# Patient Record
Sex: Female | Born: 1945
Health system: Southern US, Community
[De-identification: ages and names within clinical notes are randomized; demographics above are authoritative.]

## PROBLEM LIST (undated history)

## (undated) DIAGNOSIS — R0600 Dyspnea, unspecified: Secondary | ICD-10-CM

## (undated) DIAGNOSIS — Z889 Allergy status to unspecified drugs, medicaments and biological substances status: Secondary | ICD-10-CM

## (undated) DIAGNOSIS — F419 Anxiety disorder, unspecified: Secondary | ICD-10-CM

## (undated) DIAGNOSIS — I10 Essential (primary) hypertension: Secondary | ICD-10-CM

## (undated) DIAGNOSIS — R011 Cardiac murmur, unspecified: Secondary | ICD-10-CM

## (undated) DIAGNOSIS — E7800 Pure hypercholesterolemia, unspecified: Secondary | ICD-10-CM

## (undated) DIAGNOSIS — M4800 Spinal stenosis, site unspecified: Secondary | ICD-10-CM

## (undated) DIAGNOSIS — I4891 Unspecified atrial fibrillation: Secondary | ICD-10-CM

## (undated) DIAGNOSIS — C801 Malignant (primary) neoplasm, unspecified: Secondary | ICD-10-CM

## (undated) DIAGNOSIS — J45909 Unspecified asthma, uncomplicated: Secondary | ICD-10-CM

## (undated) DIAGNOSIS — M1990 Unspecified osteoarthritis, unspecified site: Secondary | ICD-10-CM

## (undated) HISTORY — PX: BREAST SURGERY: SHX581

## (undated) HISTORY — PX: CHOLECYSTECTOMY: SHX55

## (undated) HISTORY — PX: CARDIAC CATHETERIZATION: SHX172

## (undated) HISTORY — DX: Spinal stenosis, site unspecified: M48.00

## (undated) HISTORY — PX: ABDOMINAL HYSTERECTOMY: SHX81

---

## 2013-09-02 ENCOUNTER — Encounter (HOSPITAL_BASED_OUTPATIENT_CLINIC_OR_DEPARTMENT_OTHER): Payer: Self-pay | Admitting: Emergency Medicine

## 2013-09-02 ENCOUNTER — Emergency Department (HOSPITAL_BASED_OUTPATIENT_CLINIC_OR_DEPARTMENT_OTHER)
Admission: EM | Admit: 2013-09-02 | Discharge: 2013-09-03 | Disposition: A | Discharge status: Home / Self Care | Payer: No Typology Code available for payment source | Attending: Emergency Medicine | Admitting: Emergency Medicine

## 2013-09-02 DIAGNOSIS — B349 Viral infection, unspecified: Secondary | ICD-10-CM

## 2013-09-02 DIAGNOSIS — I10 Essential (primary) hypertension: Secondary | ICD-10-CM | POA: Diagnosis not present

## 2013-09-02 DIAGNOSIS — B9789 Other viral agents as the cause of diseases classified elsewhere: Secondary | ICD-10-CM | POA: Diagnosis not present

## 2013-09-02 DIAGNOSIS — J45909 Unspecified asthma, uncomplicated: Secondary | ICD-10-CM | POA: Insufficient documentation

## 2013-09-02 DIAGNOSIS — K219 Gastro-esophageal reflux disease without esophagitis: Secondary | ICD-10-CM | POA: Insufficient documentation

## 2013-09-02 DIAGNOSIS — Z79899 Other long term (current) drug therapy: Secondary | ICD-10-CM | POA: Insufficient documentation

## 2013-09-02 HISTORY — DX: Unspecified asthma, uncomplicated: J45.909

## 2013-09-02 HISTORY — DX: Essential (primary) hypertension: I10

## 2013-09-02 NOTE — ED Notes (Signed)
C/o wheezing, cough x 1 week,  Has been see and treated for sore throat and ear infections

## 2013-09-02 NOTE — ED Notes (Addendum)
States she has been seen 3 x's. Once for ear infection and once for bronchitis. She has taken Augmentin, Prednisone, Depo injections, Ceftin.  Started on Symbacort yesterday. Here tonight with continued cough and sob.

## 2013-09-02 NOTE — ED Provider Notes (Signed)
CSN: [id number]     Arrival date & time 09/02/13  2203 History  This chart was scribed for No att. providers found by Anastasia Pall, ED Scribe. This patient was seen in room MH08/MH08 and the patient's care was started at 10:56 PM.   No chief complaint on file.  (Consider location/radiation/quality/duration/timing/severity/associated sxs/prior Treatment) Patient is a 68 y.o. female presenting with pharyngitis and cough. The history is provided by the patient. No language interpreter was used.  Sore Throat This is a chronic problem. The current episode started more than 1 week ago. The problem occurs constantly. The problem has not changed since onset.Pertinent negatives include no chest pain, no abdominal pain, no headaches and no shortness of breath. Nothing aggravates the symptoms. Nothing relieves the symptoms. Treatments tried: inhalers abx steroids. The treatment provided no relief.  Cough Cough characteristics:  Non-productive Severity:  Mild Onset quality:  Gradual Timing:  Intermittent Progression:  Unchanged Smoker: no   Context: upper respiratory infection   Context: not animal exposure   Relieved by:  Nothing Ineffective treatments:  Beta-agonist inhaler, cough suppressants, steroid inhaler and decongestant Associated symptoms: sore throat   Associated symptoms: no chest pain, no headaches, no rash, no shortness of breath and no weight loss   Risk factors: no chemical exposure    HPI Comments: April Guerra is a 68 y.o. female who presents to the Emergency Department having had a negaitve cxr yesterday and 2 rounds of antibiotics and steroids injections steroid tapes and was yesterday started on omeprazole.     PCP - No primary provider on file.  Past Medical History  Diagnosis Date  . Hypertension   . Asthma    Past Surgical History  Procedure Laterality Date  . Cholecystectomy     No family history on file. History  Substance Use Topics  . Smoking status: Never  Smoker   . Smokeless tobacco: Not on file  . Alcohol Use: No   OB History   Grav Para Term Preterm Abortions TAB SAB Ect Mult Living                 Review of Systems  Constitutional: Negative for weight loss.  HENT: Positive for sore throat.   Respiratory: Positive for cough. Negative for shortness of breath.   Cardiovascular: Negative for chest pain.  Gastrointestinal: Negative for abdominal pain.  Skin: Negative for rash.  Neurological: Negative for headaches.  All other systems reviewed and are negative.    Allergies  Review of patient's allergies indicates no known allergies.  Home Medications   Current Outpatient Rx  Name  Route  Sig  Dispense  Refill  . ALBUTEROL IN   Inhalation   Inhale into the lungs.         . Budesonide-Formoterol Fumarate (SYMBICORT IN)   Inhalation   Inhale into the lungs.         Marland Kitchen lisinopril-hydrochlorothiazide (PRINZIDE,ZESTORETIC) 20-25 MG per tablet   Oral   Take 1 tablet by mouth daily.         . metoprolol succinate (TOPROL-XL) 50 MG 24 hr tablet   Oral   Take 50 mg by mouth daily. Take with or immediately following a meal.         . SIMVASTATIN PO   Oral   Take by mouth.          BP 232/102  Pulse 78  Temp(Src) 97.7 F (36.5 C) (Oral)  Resp 22  Ht 4\' 11"  (1.499 m)  Wt  178 lb (80.74 kg)  BMI 35.93 kg/m2  SpO2 100%  Physical Exam  Constitutional: She is oriented to person, place, and time. She appears well-developed and well-nourished. No distress.  HENT:  Head: Normocephalic and atraumatic.  Mouth/Throat: Oropharynx is clear and moist. No oropharyngeal exudate.  Eyes: Conjunctivae and EOM are normal. Pupils are equal, round, and reactive to light.  Neck: Normal range of motion. Neck supple. No tracheal deviation present.  Cardiovascular: Normal rate, regular rhythm and intact distal pulses.   Pulmonary/Chest: Effort normal and breath sounds normal. No stridor. No respiratory distress. She has no wheezes.  She has no rales.  Abdominal: Soft. Bowel sounds are normal. There is no tenderness. There is no rebound and no guarding.  Musculoskeletal: Normal range of motion.  Lymphadenopathy:    She has no cervical adenopathy.  Neurological: She is alert and oriented to person, place, and time.  Skin: Skin is warm and dry.  Psychiatric: She has a normal mood and affect.    ED Course  Procedures (including critical care time)  DIAGNOSTIC STUDIES: Oxygen Saturation is 100% on room air, normal by my interpretation.    COORDINATION OF CARE:  Labs Review Labs Reviewed - No data to display Imaging Review No results found.   EKG Interpretation None     Medications - No data to display  MDM   Final diagnoses:  None    Symptoms sound viral and now patient's BP is up and she is having GERD sx as she has been on so many steroids.  Will start carafate and refer to pulmonology and GI there is no indication for repeat cxr and patient is currently taking antibiotics.  Complete course call doctor about BP.  Follow up with pulmonology and ENT  I personally performed the services described in this documentation, which was scribed in my presence. The recorded information has been reviewed and is accurate.    Carlisle Beers, MD 09/03/13 [phone]

## 2013-09-03 LAB — RAPID STREP SCREEN (MED CTR MEBANE ONLY): Streptococcus, Group A Screen (Direct): NEGATIVE

## 2013-09-03 MED ORDER — AMLODIPINE BESYLATE 5 MG PO TABS
10.0000 mg | ORAL_TABLET | Freq: Once | ORAL | Status: AC
  Administered 2013-09-03: 10 mg via ORAL
  Filled 2013-09-03: qty 2

## 2013-09-03 MED ORDER — GI COCKTAIL ~~LOC~~
30.0000 mL | Freq: Once | ORAL | Status: AC
  Administered 2013-09-03: 30 mL via ORAL
  Filled 2013-09-03: qty 30

## 2013-09-03 MED ORDER — METOPROLOL TARTRATE 12.5 MG HALF TABLET
12.5000 mg | ORAL_TABLET | Freq: Once | ORAL | Status: DC
  Filled 2013-09-03: qty 1

## 2013-09-03 MED ORDER — SUCRALFATE 1 GM/10ML PO SUSP: 1.0000 g | Freq: Three times a day (TID) | ORAL | Status: DC

## 2013-09-03 MED ORDER — HYDROCHLOROTHIAZIDE 25 MG PO TABS
25.0000 mg | ORAL_TABLET | Freq: Once | ORAL | Status: AC
  Administered 2013-09-03: 25 mg via ORAL
  Filled 2013-09-03: qty 1

## 2013-09-05 LAB — CULTURE, GROUP A STREP

## 2013-12-04 DIAGNOSIS — E785 Hyperlipidemia, unspecified: Secondary | ICD-10-CM | POA: Diagnosis present

## 2013-12-04 DIAGNOSIS — E669 Obesity, unspecified: Secondary | ICD-10-CM | POA: Diagnosis present

## 2013-12-04 DIAGNOSIS — R000 Tachycardia, unspecified: Secondary | ICD-10-CM | POA: Diagnosis present

## 2013-12-04 DIAGNOSIS — I10 Essential (primary) hypertension: Secondary | ICD-10-CM | POA: Diagnosis not present

## 2013-12-04 DIAGNOSIS — R002 Palpitations: Secondary | ICD-10-CM | POA: Diagnosis not present

## 2013-12-04 DIAGNOSIS — Z6837 Body mass index (BMI) 37.0-37.9, adult: Secondary | ICD-10-CM | POA: Diagnosis not present

## 2013-12-04 DIAGNOSIS — G8929 Other chronic pain: Secondary | ICD-10-CM | POA: Diagnosis present

## 2013-12-04 DIAGNOSIS — I517 Cardiomegaly: Secondary | ICD-10-CM | POA: Diagnosis present

## 2013-12-04 DIAGNOSIS — E871 Hypo-osmolality and hyponatremia: Secondary | ICD-10-CM | POA: Diagnosis present

## 2013-12-04 DIAGNOSIS — E876 Hypokalemia: Secondary | ICD-10-CM | POA: Diagnosis present

## 2013-12-04 DIAGNOSIS — R079 Chest pain, unspecified: Secondary | ICD-10-CM | POA: Diagnosis not present

## 2013-12-04 DIAGNOSIS — IMO0002 Reserved for concepts with insufficient information to code with codable children: Secondary | ICD-10-CM | POA: Diagnosis present

## 2013-12-04 DIAGNOSIS — I214 Non-ST elevation (NSTEMI) myocardial infarction: Secondary | ICD-10-CM | POA: Diagnosis not present

## 2013-12-16 ENCOUNTER — Emergency Department (HOSPITAL_COMMUNITY): Payer: No Typology Code available for payment source

## 2013-12-16 ENCOUNTER — Inpatient Hospital Stay (HOSPITAL_COMMUNITY)
Admission: EM | Admit: 2013-12-16 | Discharge: 2013-12-19 | DRG: 238 | Disposition: A | Discharge status: Home / Self Care | Payer: No Typology Code available for payment source | Attending: Vascular Surgery | Admitting: Vascular Surgery

## 2013-12-16 ENCOUNTER — Inpatient Hospital Stay (HOSPITAL_COMMUNITY): Payer: No Typology Code available for payment source

## 2013-12-16 ENCOUNTER — Encounter (HOSPITAL_COMMUNITY): Payer: Self-pay | Admitting: Emergency Medicine

## 2013-12-16 DIAGNOSIS — I724 Aneurysm of artery of lower extremity: Secondary | ICD-10-CM | POA: Diagnosis present

## 2013-12-16 DIAGNOSIS — I998 Other disorder of circulatory system: Secondary | ICD-10-CM | POA: Diagnosis present

## 2013-12-16 DIAGNOSIS — K5900 Constipation, unspecified: Secondary | ICD-10-CM | POA: Diagnosis present

## 2013-12-16 DIAGNOSIS — J45909 Unspecified asthma, uncomplicated: Secondary | ICD-10-CM | POA: Diagnosis present

## 2013-12-16 DIAGNOSIS — I729 Aneurysm of unspecified site: Secondary | ICD-10-CM

## 2013-12-16 DIAGNOSIS — Z01818 Encounter for other preprocedural examination: Secondary | ICD-10-CM | POA: Diagnosis not present

## 2013-12-16 DIAGNOSIS — I999 Unspecified disorder of circulatory system: Principal | ICD-10-CM | POA: Diagnosis present

## 2013-12-16 DIAGNOSIS — Y9210 Unspecified residential institution as the place of occurrence of the external cause: Secondary | ICD-10-CM | POA: Diagnosis present

## 2013-12-16 DIAGNOSIS — Z7982 Long term (current) use of aspirin: Secondary | ICD-10-CM

## 2013-12-16 DIAGNOSIS — T81718A Complication of other artery following a procedure, not elsewhere classified, initial encounter: Secondary | ICD-10-CM

## 2013-12-16 DIAGNOSIS — I10 Essential (primary) hypertension: Secondary | ICD-10-CM | POA: Diagnosis present

## 2013-12-16 DIAGNOSIS — R6889 Other general symptoms and signs: Secondary | ICD-10-CM | POA: Diagnosis not present

## 2013-12-16 DIAGNOSIS — M7981 Nontraumatic hematoma of soft tissue: Secondary | ICD-10-CM | POA: Diagnosis not present

## 2013-12-16 DIAGNOSIS — Y849 Medical procedure, unspecified as the cause of abnormal reaction of the patient, or of later complication, without mention of misadventure at the time of the procedure: Secondary | ICD-10-CM | POA: Diagnosis present

## 2013-12-16 LAB — CBC WITH DIFFERENTIAL/PLATELET
BASOS PCT: 0 % (ref 0–1)
Basophils Absolute: 0 10*3/uL (ref 0.0–0.1)
EOS ABS: 0 10*3/uL (ref 0.0–0.7)
EOS PCT: 0 % (ref 0–5)
HCT: 30.3 % — ABNORMAL LOW (ref 36.0–46.0)
Hemoglobin: 10.8 g/dL — ABNORMAL LOW (ref 12.0–15.0)
LYMPHS ABS: 1.4 10*3/uL (ref 0.7–4.0)
Lymphocytes Relative: 15 % (ref 12–46)
MCH: 33.2 pg (ref 26.0–34.0)
MCHC: 35.6 g/dL (ref 30.0–36.0)
MCV: 93.2 fL (ref 78.0–100.0)
MONOS PCT: 9 % (ref 3–12)
Monocytes Absolute: 0.8 10*3/uL (ref 0.1–1.0)
NEUTROS PCT: 76 % (ref 43–77)
Neutro Abs: 7 10*3/uL (ref 1.7–7.7)
Platelets: 275 10*3/uL (ref 150–400)
RBC: 3.25 MIL/uL — AB (ref 3.87–5.11)
RDW: 12.4 % (ref 11.5–15.5)
WBC: 9.3 10*3/uL (ref 4.0–10.5)

## 2013-12-16 MED ORDER — ONDANSETRON HCL 4 MG/2ML IJ SOLN
INTRAMUSCULAR | Status: AC
  Filled 2013-12-16: qty 2

## 2013-12-16 MED ORDER — CENTRUM SILVER ADULT 50+ PO TABS: ORAL_TABLET | Freq: Every morning | ORAL | Status: DC

## 2013-12-16 MED ORDER — FENTANYL CITRATE 0.05 MG/ML IJ SOLN
50.0000 ug | Freq: Once | INTRAMUSCULAR | Status: AC
  Administered 2013-12-16: 50 ug via INTRAVENOUS

## 2013-12-16 MED ORDER — ACETAMINOPHEN 650 MG RE SUPP
325.0000 mg | RECTAL | Status: DC | PRN
Start: 2013-12-16 — End: 2013-12-17

## 2013-12-16 MED ORDER — LISINOPRIL 20 MG PO TABS
20.0000 mg | ORAL_TABLET | Freq: Every day | ORAL | Status: DC
  Administered 2013-12-18 – 2013-12-19 (×2): 20 mg via ORAL
  Filled 2013-12-16 (×3): qty 1

## 2013-12-16 MED ORDER — ONDANSETRON HCL 4 MG/2ML IJ SOLN: 4.0000 mg | Freq: Four times a day (QID) | INTRAMUSCULAR | Status: DC | PRN

## 2013-12-16 MED ORDER — DOCUSATE SODIUM 100 MG PO CAPS
100.0000 mg | ORAL_CAPSULE | Freq: Two times a day (BID) | ORAL | Status: DC
  Administered 2013-12-16: 100 mg via ORAL
  Filled 2013-12-16 (×3): qty 1

## 2013-12-16 MED ORDER — LIDOCAINE HCL (PF) 1 % IJ SOLN
INTRAMUSCULAR | Status: AC
  Filled 2013-12-16: qty 30

## 2013-12-16 MED ORDER — LABETALOL HCL 5 MG/ML IV SOLN: 10.0000 mg | INTRAVENOUS | Status: DC | PRN

## 2013-12-16 MED ORDER — AMLODIPINE BESYLATE 5 MG PO TABS
5.0000 mg | ORAL_TABLET | Freq: Every day | ORAL | Status: DC
  Administered 2013-12-18 – 2013-12-19 (×2): 5 mg via ORAL
  Filled 2013-12-16 (×3): qty 1

## 2013-12-16 MED ORDER — SODIUM CHLORIDE 0.9 % IJ SOLN
3.0000 mL | Freq: Two times a day (BID) | INTRAMUSCULAR | Status: DC
  Administered 2013-12-16: 3 mL via INTRAVENOUS

## 2013-12-16 MED ORDER — ASPIRIN 325 MG PO TABS
325.0000 mg | ORAL_TABLET | ORAL | Status: DC
  Administered 2013-12-19: 325 mg via ORAL
  Filled 2013-12-16 (×2): qty 1

## 2013-12-16 MED ORDER — VITAMIN C 500 MG PO TABS
500.0000 mg | ORAL_TABLET | Freq: Every day | ORAL | Status: DC
  Administered 2013-12-18 – 2013-12-19 (×2): 500 mg via ORAL
  Filled 2013-12-16 (×3): qty 1

## 2013-12-16 MED ORDER — OXYCODONE HCL 5 MG PO TABS
5.0000 mg | ORAL_TABLET | ORAL | Status: DC | PRN
  Administered 2013-12-16 – 2013-12-17 (×4): 5 mg via ORAL
  Administered 2013-12-17: 10 mg via ORAL
  Filled 2013-12-16 (×4): qty 1

## 2013-12-16 MED ORDER — SIMVASTATIN 40 MG PO TABS
40.0000 mg | ORAL_TABLET | Freq: Every day | ORAL | Status: DC
  Administered 2013-12-16: 40 mg via ORAL
  Filled 2013-12-16 (×2): qty 1

## 2013-12-16 MED ORDER — CALCIUM CARBONATE 600 MG PO TABS: 600.0000 mg | ORAL_TABLET | Freq: Two times a day (BID) | ORAL | Status: DC

## 2013-12-16 MED ORDER — CEFUROXIME SODIUM 1.5 G IJ SOLR
1.5000 g | INTRAMUSCULAR | Status: AC
  Administered 2013-12-17: 1.5 g via INTRAVENOUS
  Filled 2013-12-16: qty 1.5

## 2013-12-16 MED ORDER — PHENOL 1.4 % MT LIQD: 1.0000 | OROMUCOSAL | Status: DC | PRN

## 2013-12-16 MED ORDER — METOPROLOL TARTRATE 1 MG/ML IV SOLN: 2.0000 mg | INTRAVENOUS | Status: DC | PRN

## 2013-12-16 MED ORDER — FENTANYL CITRATE 0.05 MG/ML IJ SOLN
INTRAMUSCULAR | Status: AC
  Filled 2013-12-16: qty 2

## 2013-12-16 MED ORDER — LISINOPRIL 20 MG PO TABS: 20.0000 mg | ORAL_TABLET | Freq: Every day | ORAL | Status: DC

## 2013-12-16 MED ORDER — FENTANYL CITRATE 0.05 MG/ML IJ SOLN
50.0000 ug | Freq: Once | INTRAMUSCULAR | Status: AC
  Administered 2013-12-16: 50 ug via INTRAVENOUS
  Filled 2013-12-16: qty 2

## 2013-12-16 MED ORDER — CALCIUM CARBONATE 1250 (500 CA) MG PO TABS
1.0000 | ORAL_TABLET | Freq: Two times a day (BID) | ORAL | Status: DC
  Administered 2013-12-17 – 2013-12-19 (×4): 500 mg via ORAL
  Filled 2013-12-16 (×7): qty 1

## 2013-12-16 MED ORDER — ONDANSETRON HCL 4 MG/2ML IJ SOLN
4.0000 mg | Freq: Once | INTRAMUSCULAR | Status: AC
  Administered 2013-12-16: 4 mg via INTRAVENOUS

## 2013-12-16 MED ORDER — GUAIFENESIN-DM 100-10 MG/5ML PO SYRP
15.0000 mL | ORAL_SOLUTION | ORAL | Status: DC | PRN
Start: 2013-12-16 — End: 2013-12-17
  Filled 2013-12-16: qty 15

## 2013-12-16 MED ORDER — ONDANSETRON HCL 4 MG/2ML IJ SOLN: 4.0000 mg | Freq: Once | INTRAMUSCULAR | Status: DC

## 2013-12-16 MED ORDER — ASPIRIN 325 MG PO TABS: 325.0000 mg | ORAL_TABLET | ORAL | Status: DC

## 2013-12-16 MED ORDER — ADULT MULTIVITAMIN W/MINERALS CH
1.0000 | ORAL_TABLET | Freq: Every day | ORAL | Status: DC
  Administered 2013-12-18 – 2013-12-19 (×2): 1 via ORAL
  Filled 2013-12-16 (×3): qty 1

## 2013-12-16 MED ORDER — SODIUM CHLORIDE 0.9 % IV SOLN
250.0000 mL | INTRAVENOUS | Status: DC | PRN
Start: 2013-12-16 — End: 2013-12-17
  Administered 2013-12-17: 250 mL via INTRAVENOUS

## 2013-12-16 MED ORDER — VITAMIN D3 25 MCG (1000 UNIT) PO TABS
2000.0000 [IU] | ORAL_TABLET | Freq: Every day | ORAL | Status: DC
  Administered 2013-12-18 – 2013-12-19 (×2): 2000 [IU] via ORAL
  Filled 2013-12-16 (×3): qty 2

## 2013-12-16 MED ORDER — ONDANSETRON HCL 4 MG/2ML IJ SOLN
4.0000 mg | Freq: Once | INTRAMUSCULAR | Status: DC
  Filled 2013-12-16: qty 2

## 2013-12-16 MED ORDER — POTASSIUM CHLORIDE CRYS ER 20 MEQ PO TBCR: 20.0000 meq | EXTENDED_RELEASE_TABLET | Freq: Once | ORAL | Status: DC

## 2013-12-16 MED ORDER — ALBUTEROL SULFATE HFA 108 (90 BASE) MCG/ACT IN AERS: 2.0000 | INHALATION_SPRAY | Freq: Four times a day (QID) | RESPIRATORY_TRACT | Status: DC | PRN

## 2013-12-16 MED ORDER — HYDROMORPHONE HCL PF 1 MG/ML IJ SOLN
1.0000 mg | Freq: Once | INTRAMUSCULAR | Status: AC
  Administered 2013-12-16: 1 mg via INTRAVENOUS
  Filled 2013-12-16: qty 1

## 2013-12-16 MED ORDER — PANTOPRAZOLE SODIUM 40 MG PO TBEC: 40.0000 mg | DELAYED_RELEASE_TABLET | Freq: Every day | ORAL | Status: DC

## 2013-12-16 MED ORDER — ALUM & MAG HYDROXIDE-SIMETH 200-200-20 MG/5ML PO SUSP: 15.0000 mL | ORAL | Status: DC | PRN

## 2013-12-16 MED ORDER — HYDRALAZINE HCL 20 MG/ML IJ SOLN: 10.0000 mg | INTRAMUSCULAR | Status: DC | PRN

## 2013-12-16 MED ORDER — METOPROLOL SUCCINATE ER 100 MG PO TB24
100.0000 mg | ORAL_TABLET | Freq: Every day | ORAL | Status: DC
  Administered 2013-12-18 – 2013-12-19 (×2): 100 mg via ORAL
  Filled 2013-12-16 (×3): qty 1

## 2013-12-16 MED ORDER — ACETAMINOPHEN 325 MG PO TABS
325.0000 mg | ORAL_TABLET | ORAL | Status: DC | PRN
  Administered 2013-12-17: 650 mg via ORAL

## 2013-12-16 MED ORDER — ALBUTEROL SULFATE (2.5 MG/3ML) 0.083% IN NEBU: 2.5000 mg | INHALATION_SOLUTION | Freq: Four times a day (QID) | RESPIRATORY_TRACT | Status: DC | PRN

## 2013-12-16 MED ORDER — THROMBIN 20000 UNITS EX SOLR
CUTANEOUS | Status: AC
  Filled 2013-12-16: qty 20000

## 2013-12-16 MED ORDER — CYCLOBENZAPRINE HCL 5 MG PO TABS
5.0000 mg | ORAL_TABLET | Freq: Every day | ORAL | Status: DC | PRN
  Administered 2013-12-17: 5 mg via ORAL
  Filled 2013-12-16: qty 1

## 2013-12-16 MED ORDER — SODIUM CHLORIDE 0.9 % IJ SOLN
3.0000 mL | INTRAMUSCULAR | Status: DC | PRN
Start: 2013-12-16 — End: 2013-12-17
  Administered 2013-12-17: 3 mL via INTRAVENOUS

## 2013-12-16 MED ORDER — MORPHINE SULFATE 2 MG/ML IJ SOLN: 2.0000 mg | INTRAMUSCULAR | Status: DC | PRN

## 2013-12-16 MED ORDER — AMLODIPINE BESYLATE 5 MG PO TABS: 5.0000 mg | ORAL_TABLET | Freq: Every day | ORAL | Status: DC

## 2013-12-16 NOTE — ED Notes (Signed)
Ultrasound at bedside for imaging.

## 2013-12-16 NOTE — ED Notes (Signed)
Report attempted to the floor RN will call back for report shortly.

## 2013-12-16 NOTE — Progress Notes (Signed)
VASCULAR LAB PRELIMINARY  PRELIMINARY  PRELIMINARY  PRELIMINARY  Right lower extremity arterial duplex  completed.    Preliminary report:  Patient transfered  from [facility] following an evaluation for a pseudoaneurysm post cardiac cath. Dr. Oneida Alar was at the bedside in the emergency room for a possible thrombin injection. Pseudoaneurysm was seen measuring 3 cm x 2 cm. The thrombin injection could not be performed due to the inability to clearly image the common femoral artery primarily due to the depth of the vessel.  SLAUGHTER, [place], RVS 12/16/2013, 8:52 PM

## 2013-12-16 NOTE — ED Notes (Signed)
Dr. Raenette Rover at the bedside

## 2013-12-16 NOTE — ED Notes (Signed)
Pt had heart cath yesterday and had clean cath.  They used right groin for access and reported increased pain.  Pt transferred here from [facility] to have the pseudoaneurysm assessed by vascular because it was not fixed by compression.  Distal pulses present and palpable to right lower extremity.  Appears on paperwork our EDP Darl Householder is accepting for Dr. Oneida Alar

## 2013-12-16 NOTE — ED Notes (Signed)
Vascular MD consulted about pt able to eat now, MD refers ok to eat at this time pt to be NPO after MN.

## 2013-12-16 NOTE — ED Notes (Signed)
Pt c/o pain to rt leg after having a cardiac cath yesterday in [place]. Reports pain at cath incision site with bruising. IV placed by EMS

## 2013-12-16 NOTE — ED Notes (Signed)
Ultrasound machine at bedside for imaging.

## 2013-12-16 NOTE — ED Notes (Signed)
Ultrasound brought to bedside for imaging.

## 2013-12-16 NOTE — H&P (Signed)
[facility]  HISTORY AND PHYSICAL   History of Present Illness:  Patient is a 68 y.o. year old female who presents for evaluation of right femoral pseudoaneurysm.  Pt had cardiac cath yesterday in [place] by Dr Sondra Come.  She had some bleeding post procedure but eventually went home.  Began to have more pain and swelling in groin today and went to [place].  She requested transfer here rather than going to see her cardiolgist in [place].  Pt found to have right femoral pseudoaneurysm on [place] at [facility].  She continues to have mild pain in groin currently but no expanding mass.  Per pt no significant coronary disease on cath.  Other medical problems include hypertension and asthma which are stable.  Past Medical History  Diagnosis Date  . Hypertension   . Asthma     Past Surgical History  Procedure Laterality Date  . Cholecystectomy    . Cardiac catheterization    . Breast surgery      Social History History  Substance Use Topics  . Smoking status: Never Smoker   . Smokeless tobacco: Not on file  . Alcohol Use: No    Family History History reviewed. No pertinent family history.  Allergies  No Known Allergies   Current Facility-Administered Medications  Medication Dose Route Frequency Provider Last Rate Last Dose  . albuterol (PROVENTIL HFA;VENTOLIN HFA) 108 (90 BASE) MCG/ACT inhaler 2 puff  2 puff Inhalation Q6H PRN Elam Dutch, MD      . amLODipine (NORVASC) tablet 5 mg  5 mg Oral Daily Elam Dutch, MD      . aspirin tablet 325 mg  325 mg Oral QODAY Elam Dutch, MD      . Derrill Memo ON 12/17/2013] calcium carbonate (OS-CAL) tablet 600 mg  600 mg Oral BID WC Elam Dutch, MD      . Derrill Memo ON 12/17/2013] CENTRUM SILVER ADULT 50+ TABS   Oral q morning - 10a Elam Dutch, MD      . cholecalciferol (VITAMIN D) tablet 2,000 Units  2,000 Units Oral Daily Elam Dutch, MD      . cyclobenzaprine (FLEXERIL) tablet 5 mg  5 mg Oral  Daily PRN Elam Dutch, MD      . lisinopril (PRINIVIL,ZESTRIL) tablet 20 mg  20 mg Oral Daily Elam Dutch, MD      . metoprolol succinate (TOPROL-XL) 24 hr tablet 100 mg  100 mg Oral Daily Elam Dutch, MD      . simvastatin (ZOCOR) tablet 40 mg  40 mg Oral Daily Elam Dutch, MD      . vitamin C (ASCORBIC ACID) tablet 500 mg  500 mg Oral Daily Elam Dutch, MD       Current Outpatient Prescriptions  Medication Sig Dispense Refill  . acetaminophen (TYLENOL) 500 MG tablet Take 500-1,000 mg by mouth 2 (two) times daily. 2 tablets in the morning and 1 tablet in the evening.      Marland Kitchen albuterol (PROVENTIL HFA;VENTOLIN HFA) 108 (90 BASE) MCG/ACT inhaler Inhale 2 puffs into the lungs every 6 (six) hours as needed for wheezing or shortness of breath.      Marland Kitchen amLODipine (NORVASC) 5 MG tablet Take 5 mg by mouth daily.      Marland Kitchen aspirin 325 MG tablet Take 325 mg by mouth every other day.      . calcium carbonate (OS-CAL) 600 MG TABS tablet Take 600 mg  by mouth 2 (two) times daily with a meal.      . cetirizine (ZYRTEC) 10 MG tablet Take 10 mg by mouth daily.      . cholecalciferol (VITAMIN D) 1000 UNITS tablet Take 2,000 Units by mouth daily.      . cyclobenzaprine (FLEXERIL) 5 MG tablet Take 5 mg by mouth daily as needed for muscle spasms.      . diphenhydrAMINE (BENADRYL) 25 mg capsule Take 12.5-25 mg by mouth once.      Marland Kitchen HYDROcodone-acetaminophen (NORCO/VICODIN) 5-325 MG per tablet Take 1 tablet by mouth daily as needed for moderate pain.      Marland Kitchen lisinopril (PRINIVIL,ZESTRIL) 20 MG tablet Take 20 mg by mouth daily.      . Melatonin 5 MG CAPS Take 1 capsule by mouth at bedtime.      . metoprolol succinate (TOPROL-XL) 100 MG 24 hr tablet Take 100 mg by mouth daily. Take with or immediately following a meal.      . Multiple Vitamins-Minerals (CENTRUM SILVER ADULT 50+ PO) Take 1 tablet by mouth daily.      . ranitidine (ZANTAC) 150 MG tablet Take 150 mg by mouth 2 (two) times daily.      .  simvastatin (ZOCOR) 40 MG tablet Take 40 mg by mouth daily.      . vitamin C (ASCORBIC ACID) 500 MG tablet Take 500 mg by mouth daily.        ROS:   General:  No weight loss, Fever, chills  HEENT: No recent headaches, no nasal bleeding, no visual changes, no sore throat  Neurologic: No dizziness, blackouts, seizures. No recent symptoms of stroke or mini- stroke. No recent episodes of slurred speech, or temporary blindness.  Cardiac: No recent episodes of chest pain/pressure, no shortness of breath at rest.  No shortness of breath with exertion.  Denies history of atrial fibrillation or irregular heartbeat  Vascular: No history of rest pain in feet.  No history of claudication.  No history of non-healing ulcer, No history of DVT   Pulmonary: No home oxygen, no productive cough, no hemoptysis,  No asthma or wheezing  Musculoskeletal:  [ ]  Arthritis, [ ]  Low back pain,  [ ]  Joint pain  Hematologic:No history of hypercoagulable state.  No history of easy bleeding.  No history of anemia  Gastrointestinal: No hematochezia or melena,  No gastroesophageal reflux, no trouble swallowing  Urinary: [ ]  chronic Kidney disease, [ ]  on HD - [ ]  MWF or [ ]  TTHS, [ ]  Burning with urination, [ ]  Frequent urination, [ ]  Difficulty urinating;   Skin: No rashes  Psychological: No history of anxiety,  No history of depression   Physical Examination  Filed Vitals:   12/16/13 1915 12/16/13 1930 12/16/13 1945 12/16/13 2000  BP: 166/75 183/68 150/91 157/69  Pulse: 65 60 66 63  Temp:      TempSrc:      Resp: 15 18 15 16   Height:      Weight:      SpO2: 100% 100% 98% 99%    Body mass index is 31.01 kg/(m^2).  General:  Alert and oriented, no acute distress HEENT: Normal Neck: No bruit or JVD Pulmonary: Clear to auscultation bilaterally Cardiac: Regular Rate and Rhythm without murmur Abdomen: Soft, non-tender, non-distended, no mass Skin: No rash, ecchymosis right groin extending to medial  thigh, no discrete mass Extremity Pulses:  2+ radial, brachial, femoral, 2+ right dorsalis pedis,2+ left posterior tibial pulse Musculoskeletal: No  deformity or edema  Neurologic: Upper and lower extremity motor 5/5 and symmetric  DATA:  Duplex repeated here has femoral pseudoaneurysm but no good window to see neck and common femoral artery making it unsafe for thrombin injection   ASSESSMENT:  Right femoral psedoaneurysm no skin compromise hemodynamic instability or active bleeding   PLAN:  Repair right femoral pseudoaneurysm in OR tomorrow.  Npo p midnight.  Consent. AM labs, baseline EKG and chest xray  Ruta Hinds, MD [facility] [place] Office: [phone] Pager: [phone]

## 2013-12-16 NOTE — ED Provider Notes (Signed)
CSN: [id number]     Arrival date & time 12/16/13  1316 History  This chart was scribed for Carmin Muskrat, MD by Roe Coombs, ED Scribe. The patient was seen in room APA08/APA08. Patient's care was started at 1:29 PM.   Chief Complaint  Patient presents with  . Leg Pain    The history is provided by the patient. No language interpreter was used.    HPI Comments: April Guerra is a 68 y.o. female who presents to the Emergency Department complaining of gradually worsening, constant, moderate right leg pain that began yesterday after a cardiac catheterization. She states that she had a cardiac catheterization in [place]. She says that she has some bleeding after the procedure so pressure was applied and she was allowed to go home. Patient states that she developed right upper leg pain immediately after the procedure that was initially just soreness until today when she started to have sharp pains in her leg and back. She says that her upper leg is 2.5 inches larger than her left thigh. She denies numbness or weakness in her extremities. She takes aspirin, but no other anticoagulants.   PCP - Rory Percy, Ledell Noss  Past Medical History  Diagnosis Date  . Hypertension   . Asthma    Past Surgical History  Procedure Laterality Date  . Cholecystectomy    . Cardiac catheterization     History reviewed. No pertinent family history. History  Substance Use Topics  . Smoking status: Never Smoker   . Smokeless tobacco: Not on file  . Alcohol Use: No   OB History   Grav Para Term Preterm Abortions TAB SAB Ect Mult Living                 Review of Systems  Constitutional:       Per HPI, otherwise negative  HENT:       Per HPI, otherwise negative  Respiratory:       Per HPI, otherwise negative  Cardiovascular:       Per HPI, otherwise negative  Gastrointestinal: Negative for vomiting.  Endocrine:       Negative aside from HPI  Genitourinary:       Neg aside from HPI   Musculoskeletal:        Per HPI, otherwise negative  Skin: Negative.   Neurological: Negative for syncope, weakness and numbness.      Allergies  Review of patient's allergies indicates no known allergies.  Home Medications   Prior to Admission medications   Medication Sig Start Date End Date Taking? Authorizing Provider  ALBUTEROL IN Inhale into the lungs.    Historical Provider, MD  Budesonide-Formoterol Fumarate (SYMBICORT IN) Inhale into the lungs.    Historical Provider, MD  lisinopril-hydrochlorothiazide (PRINZIDE,ZESTORETIC) 20-25 MG per tablet Take 1 tablet by mouth daily.    Historical Provider, MD  metoprolol succinate (TOPROL-XL) 50 MG 24 hr tablet Take 50 mg by mouth daily. Take with or immediately following a meal.    Historical Provider, MD  SIMVASTATIN PO Take by mouth.    Historical Provider, MD  sucralfate (CARAFATE) 1 GM/10ML suspension Take 10 mLs (1 g total) by mouth 4 (four) times daily -  with meals and at bedtime. 09/03/13   April K Palumbo-Rasch, MD   Triage Vitals: BP 177/75  Pulse 70  Temp(Src) 98 F (36.7 C) (Oral)  Ht 5\' 3"  (1.6 m)  Wt 175 lb (79.379 kg)  BMI 31.01 kg/m2  SpO2 100% Physical Exam  Nursing  note and vitals reviewed. Constitutional: She is oriented to person, place, and time. She appears well-developed and well-nourished. No distress.  HENT:  Head: Normocephalic and atraumatic.  Eyes: Conjunctivae and EOM are normal.  Cardiovascular: Normal rate, regular rhythm and intact distal pulses.   Pulmonary/Chest: Effort normal and breath sounds normal. No stridor. No respiratory distress.  Abdominal: She exhibits no distension.  Musculoskeletal: She exhibits no edema.  Pain with flexion of the knee. Large hematoma to right medial thigh, covering the proximal anterior and medial aspects w ttp throughout.  Neurological: She is alert and oriented to person, place, and time. No cranial nerve deficit.  Normal sensation in lower extremities.  Skin: Skin is warm and  dry.  Psychiatric: She has a normal mood and affect.    ED Course  Procedures (including critical care time)  COORDINATION OF CARE: 1:37 PM- Patient informed of current plan for treatment and evaluation and agrees with plan at this time.   Imaging Review [place] Lower Ext Art Right Ltd  12/16/2013   CLINICAL DATA:  RIGHT groin pseudoaneurysm  EXAM: UNILATERAL RIGHT LOWER EXTREMITY ARTERIAL DUPLEX SCAN LIMITED  TECHNIQUE: Graded compression of the pseudoaneurysm at the RIGHT groin was performed.  COMPARISON:  Arterial ultrasound 12/16/2013  FINDINGS: With graded compression of the neck of the RIGHT groin pseudoaneurysm, the pseudoaneurysm remained patent.  Despite compression, blood flow remains visible on color Doppler imaging within the neck and body of the pseudoaneurysm.  Unable to elicit thrombosis of the pseudoaneurysm or the neck of the pseudoaneurysm with compression.  Color Doppler imaging and pulsed Doppler imaging performed following conclusion of graded compression demonstrates patency of the underlying RIGHT common femoral artery and vein.  IMPRESSION: Failed graded compression of the pseudoaneurysm at the RIGHT groin.   Electronically Signed   By: Lavonia Dana M.D.   On: 12/16/2013 17:15   [place] Lower Ext Art Right Ltd  12/16/2013   CLINICAL DATA:  Pain and swelling RIGHT inguinal region 1 day post cardiac catheterization  EXAM: UNILATERAL RIGHT LOWER EXTREMITY ARTERIAL DUPLEX SCAN  TECHNIQUE: Gray-scale sonography as well as color Doppler and duplex ultrasound was performed to evaluate the arteries of the lower extremity.  COMPARISON:  None  FINDINGS: At the site of clinical concern in RIGHT inguinal region, and extraluminal hypoechoic collection is identified measuring approximately 6.1 x 2.4 x 2.4 cm.  Collection demonstrates internal blood flow on color Doppler imaging with arterial pulsatility on waveform analysis.  A defined neck up to 7 mm diameter extends from this collection to the  underlying RIGHT common femoral artery.  Finding is compatible with a pseudoaneurysm of the RIGHT common femoral artery.  Surrounding soft tissue infiltration and mild hematoma are present.  RIGHT common femoral vein is displaced medially but patent.  No evidence of AV fistula seen at the RIGHT common femoral vein.  RIGHT superficial femoral and profunda femoral arteries appear patent.  Patient tender at this region with transducer pressure.  IMPRESSION: Extraluminal collection 6.1 x 2.4 x 2.4 cm in size in the RIGHT inguinal region extending via a defined neck up to 7 mm diameter to the underlying RIGHT common femoral artery consistent with a pseudoaneurysm post catheterization.  Surrounding hematoma and soft tissue swelling/infiltration.   Electronically Signed   By: Lavonia Dana M.D.   On: 12/16/2013 16:16    Immediately after the initial evaluation I discussed the patient's case with our ultrasonographer. Performed ultrasound to evaluate for pseudoaneurysm. Subsequently I again discussed findings with ultrasound  team, and upon finding evidence for pseudoaneurysm, we attempted ultrasound closure with pressure.  On exam the pseudoaneurysm was not closed.  After discussing the patient's case with our general surgeon here, I did discuss her case with interventional radiology, and vascular surgery and arrange for transfer to higher level of care for definitive repair of her pseudoaneurysm.   MDM   I personally performed the services described in this documentation, which was scribed in my presence. The recorded information has been reviewed and is accurate.  Patient presents with increasing right proximal thigh pain following procedure performed yesterday.  Although the patient had a procedure done at another facility, in another state, she returns here for evaluation.  On exam patient is awake alert, but has a notable lesion in her proximal thigh.  There is immediate suspicion for  pseudoaneurysm. This suspicion was confirmed with ultrasound. After attempts at bedside closure with ultrasound guided pressure to the neck of the aneurysm were unsuccessful, I discussed the patient's case with multiple specialists, including radiology, general surgery, interventional radiology, and vascular surgery to arrange transfer to a higher level of care for facilitation of definitive closure.      Carmin Muskrat, MD 12/16/13 2119

## 2013-12-16 NOTE — ED Notes (Signed)
[facility] called for Vascular Surgery per MD request.

## 2013-12-16 NOTE — ED Notes (Signed)
Paged Dr. Oneida Alar to notify him of patient's arrival

## 2013-12-17 ENCOUNTER — Inpatient Hospital Stay (HOSPITAL_COMMUNITY): Payer: No Typology Code available for payment source | Admitting: Anesthesiology

## 2013-12-17 ENCOUNTER — Encounter (HOSPITAL_COMMUNITY): Payer: Self-pay | Admitting: Certified Registered Nurse Anesthetist

## 2013-12-17 ENCOUNTER — Encounter (HOSPITAL_COMMUNITY)
Admission: EM | Disposition: A | Discharge status: Home / Self Care | Payer: Self-pay | Source: Home / Self Care | Attending: Vascular Surgery

## 2013-12-17 HISTORY — PX: THROMBECTOMY FEMORAL ARTERY: SHX6406

## 2013-12-17 LAB — COMPREHENSIVE METABOLIC PANEL
ALBUMIN: 3.7 g/dL (ref 3.5–5.2)
ALK PHOS: 50 U/L (ref 39–117)
ALT: 47 U/L — AB (ref 0–35)
AST: 54 U/L — ABNORMAL HIGH (ref 0–37)
BUN: 11 mg/dL (ref 6–23)
CO2: 26 mEq/L (ref 19–32)
Calcium: 9 mg/dL (ref 8.4–10.5)
Chloride: 96 mEq/L (ref 96–112)
Creatinine, Ser: 0.67 mg/dL (ref 0.50–1.10)
GFR calc Af Amer: >90 mL/min (ref >90)
GFR calc non Af Amer: 89 mL/min — ABNORMAL LOW (ref >90)
Glucose, Bld: 96 mg/dL (ref 70–99)
POTASSIUM: 4.4 meq/L (ref 3.7–5.3)
SODIUM: 135 meq/L — AB (ref 137–147)
TOTAL PROTEIN: 6.4 g/dL (ref 6.0–8.3)
Total Bilirubin: 0.5 mg/dL (ref 0.3–1.2)

## 2013-12-17 LAB — URINALYSIS, ROUTINE W REFLEX MICROSCOPIC
BILIRUBIN URINE: NEGATIVE
GLUCOSE, UA: NEGATIVE mg/dL
KETONES UR: NEGATIVE mg/dL
Nitrite: NEGATIVE
PH: 5.5 (ref 5.0–8.0)
PROTEIN: NEGATIVE mg/dL
Specific Gravity, Urine: 1.012 (ref 1.005–1.030)
Urobilinogen, UA: 0.2 mg/dL (ref 0.0–1.0)

## 2013-12-17 LAB — CBC
HEMATOCRIT: 29.3 % — AB (ref 36.0–46.0)
Hemoglobin: 10.1 g/dL — ABNORMAL LOW (ref 12.0–15.0)
MCH: 32.3 pg (ref 26.0–34.0)
MCHC: 34.5 g/dL (ref 30.0–36.0)
MCV: 93.6 fL (ref 78.0–100.0)
Platelets: 268 10*3/uL (ref 150–400)
RBC: 3.13 MIL/uL — ABNORMAL LOW (ref 3.87–5.11)
RDW: 12.6 % (ref 11.5–15.5)
WBC: 9.1 10*3/uL (ref 4.0–10.5)

## 2013-12-17 LAB — URINE MICROSCOPIC-ADD ON

## 2013-12-17 LAB — ABO/RH: ABO/RH(D): O POS

## 2013-12-17 LAB — SURGICAL PCR SCREEN
MRSA, PCR: NEGATIVE
STAPHYLOCOCCUS AUREUS: NEGATIVE

## 2013-12-17 LAB — TYPE AND SCREEN
ABO/RH(D): O POS
Antibody Screen: NEGATIVE

## 2013-12-17 SURGERY — THROMBECTOMY, ARTERY, FEMORAL
Anesthesia: General | Laterality: Right

## 2013-12-17 MED ORDER — HYDRALAZINE HCL 20 MG/ML IJ SOLN: 10.0000 mg | INTRAMUSCULAR | Status: DC | PRN

## 2013-12-17 MED ORDER — ACETAMINOPHEN 325 MG RE SUPP
325.0000 mg | RECTAL | Status: DC | PRN
  Filled 2013-12-17: qty 2

## 2013-12-17 MED ORDER — LIDOCAINE HCL (CARDIAC) 20 MG/ML IV SOLN
INTRAVENOUS | Status: AC
  Filled 2013-12-17: qty 5

## 2013-12-17 MED ORDER — ONDANSETRON HCL 4 MG/2ML IJ SOLN
INTRAMUSCULAR | Status: AC
  Filled 2013-12-17: qty 2

## 2013-12-17 MED ORDER — PROPOFOL 10 MG/ML IV BOLUS
INTRAVENOUS | Status: AC
  Filled 2013-12-17: qty 20

## 2013-12-17 MED ORDER — LIDOCAINE HCL (CARDIAC) 20 MG/ML IV SOLN
INTRAVENOUS | Status: DC | PRN
  Administered 2013-12-17: 40 mg via INTRAVENOUS

## 2013-12-17 MED ORDER — MEPERIDINE HCL 25 MG/ML IJ SOLN: 6.2500 mg | INTRAMUSCULAR | Status: DC | PRN

## 2013-12-17 MED ORDER — PROPOFOL 10 MG/ML IV BOLUS
INTRAVENOUS | Status: DC | PRN
  Administered 2013-12-17: 120 mg via INTRAVENOUS

## 2013-12-17 MED ORDER — PHENOL 1.4 % MT LIQD
1.0000 | OROMUCOSAL | Status: DC | PRN
  Filled 2013-12-17 (×2): qty 177

## 2013-12-17 MED ORDER — DOCUSATE SODIUM 100 MG PO CAPS
100.0000 mg | ORAL_CAPSULE | Freq: Every day | ORAL | Status: DC
  Administered 2013-12-18 – 2013-12-19 (×2): 100 mg via ORAL
  Filled 2013-12-17 (×3): qty 1

## 2013-12-17 MED ORDER — GUAIFENESIN-DM 100-10 MG/5ML PO SYRP
15.0000 mL | ORAL_SOLUTION | ORAL | Status: DC | PRN
  Filled 2013-12-17: qty 15

## 2013-12-17 MED ORDER — ACETAMINOPHEN 325 MG PO TABS
325.0000 mg | ORAL_TABLET | ORAL | Status: DC | PRN
  Administered 2013-12-17 (×2): 650 mg via ORAL
  Administered 2013-12-18: 325 mg via ORAL
  Administered 2013-12-18 – 2013-12-19 (×2): 650 mg via ORAL
  Filled 2013-12-17: qty 1
  Filled 2013-12-17 (×4): qty 2

## 2013-12-17 MED ORDER — ROCURONIUM BROMIDE 100 MG/10ML IV SOLN
INTRAVENOUS | Status: DC | PRN
  Administered 2013-12-17: 40 mg via INTRAVENOUS

## 2013-12-17 MED ORDER — OXYCODONE HCL 5 MG PO TABS
5.0000 mg | ORAL_TABLET | Freq: Four times a day (QID) | ORAL | Status: DC | PRN
  Administered 2013-12-18 (×2): 10 mg via ORAL
  Administered 2013-12-18: 5 mg via ORAL
  Administered 2013-12-19: 10 mg via ORAL
  Filled 2013-12-17 (×4): qty 2
  Filled 2013-12-17: qty 1

## 2013-12-17 MED ORDER — ACETAMINOPHEN 325 MG PO TABS
ORAL_TABLET | ORAL | Status: AC
  Administered 2013-12-17: 650 mg via ORAL
  Filled 2013-12-17: qty 2

## 2013-12-17 MED ORDER — ROCURONIUM BROMIDE 50 MG/5ML IV SOLN
INTRAVENOUS | Status: AC
  Filled 2013-12-17: qty 1

## 2013-12-17 MED ORDER — ALUM & MAG HYDROXIDE-SIMETH 200-200-20 MG/5ML PO SUSP: 15.0000 mL | ORAL | Status: DC | PRN

## 2013-12-17 MED ORDER — HYDROMORPHONE HCL PF 1 MG/ML IJ SOLN
0.2500 mg | INTRAMUSCULAR | Status: DC | PRN
  Administered 2013-12-17 (×2): 0.5 mg via INTRAVENOUS

## 2013-12-17 MED ORDER — DOCUSATE SODIUM 100 MG PO CAPS: 100.0000 mg | ORAL_CAPSULE | Freq: Every day | ORAL | Status: DC

## 2013-12-17 MED ORDER — MIDAZOLAM HCL 5 MG/5ML IJ SOLN
INTRAMUSCULAR | Status: DC | PRN
  Administered 2013-12-17: 2 mg via INTRAVENOUS

## 2013-12-17 MED ORDER — ONDANSETRON HCL 4 MG/2ML IJ SOLN
INTRAMUSCULAR | Status: DC | PRN
  Administered 2013-12-17: 4 mg via INTRAVENOUS

## 2013-12-17 MED ORDER — HYDROMORPHONE HCL PF 1 MG/ML IJ SOLN
INTRAMUSCULAR | Status: AC
  Administered 2013-12-17: 0.5 mg via INTRAVENOUS
  Filled 2013-12-17: qty 1

## 2013-12-17 MED ORDER — THROMBIN 20000 UNITS EX SOLR
CUTANEOUS | Status: DC | PRN
  Administered 2013-12-17: 12:00:00 via TOPICAL

## 2013-12-17 MED ORDER — FENTANYL CITRATE 0.05 MG/ML IJ SOLN
INTRAMUSCULAR | Status: DC | PRN
  Administered 2013-12-17 (×2): 50 ug via INTRAVENOUS

## 2013-12-17 MED ORDER — PROTAMINE SULFATE 10 MG/ML IV SOLN
INTRAVENOUS | Status: DC | PRN
  Administered 2013-12-17: 50 mg via INTRAVENOUS
  Administered 2013-12-17: 15 mg via INTRAVENOUS
  Administered 2013-12-17: 5 mg via INTRAVENOUS
  Administered 2013-12-17: 10 mg via INTRAVENOUS

## 2013-12-17 MED ORDER — DEXTROSE-NACL 5-0.45 % IV SOLN: INTRAVENOUS | Status: DC

## 2013-12-17 MED ORDER — PHENYLEPHRINE HCL 10 MG/ML IJ SOLN
INTRAMUSCULAR | Status: DC | PRN
  Administered 2013-12-17 (×3): 40 ug via INTRAVENOUS
  Administered 2013-12-17 (×2): 80 ug via INTRAVENOUS

## 2013-12-17 MED ORDER — NEOSTIGMINE METHYLSULFATE 10 MG/10ML IV SOLN
INTRAVENOUS | Status: DC | PRN
  Administered 2013-12-17: 3 mg via INTRAVENOUS

## 2013-12-17 MED ORDER — THROMBIN 20000 UNITS EX SOLR
CUTANEOUS | Status: AC
  Filled 2013-12-17: qty 20000

## 2013-12-17 MED ORDER — METOPROLOL TARTRATE 1 MG/ML IV SOLN: 2.0000 mg | INTRAVENOUS | Status: DC | PRN

## 2013-12-17 MED ORDER — DEXTROSE 5 % IV SOLN
INTRAVENOUS | Status: AC
  Filled 2013-12-17: qty 1.5

## 2013-12-17 MED ORDER — DEXTROSE 5 % IV SOLN
1.5000 g | INTRAVENOUS | Status: DC | PRN
  Administered 2013-12-17: 1.5 g via INTRAVENOUS

## 2013-12-17 MED ORDER — MAGNESIUM SULFATE 40 MG/ML IJ SOLN
2.0000 g | Freq: Every day | INTRAMUSCULAR | Status: DC | PRN
  Filled 2013-12-17: qty 50

## 2013-12-17 MED ORDER — MIDAZOLAM HCL 2 MG/2ML IJ SOLN
INTRAMUSCULAR | Status: AC
  Filled 2013-12-17: qty 2

## 2013-12-17 MED ORDER — HEPARIN SODIUM (PORCINE) 1000 UNIT/ML IJ SOLN
INTRAMUSCULAR | Status: DC | PRN
  Administered 2013-12-17: 8000 [IU] via INTRAVENOUS

## 2013-12-17 MED ORDER — HEPARIN SODIUM (PORCINE) 1000 UNIT/ML IJ SOLN
INTRAMUSCULAR | Status: AC
  Filled 2013-12-17: qty 1

## 2013-12-17 MED ORDER — OXYCODONE HCL 5 MG PO TABS: 5.0000 mg | ORAL_TABLET | Freq: Once | ORAL | Status: DC | PRN

## 2013-12-17 MED ORDER — OXYCODONE HCL 5 MG PO TABS
ORAL_TABLET | ORAL | Status: AC
  Administered 2013-12-17: 10 mg via ORAL
  Filled 2013-12-17: qty 2

## 2013-12-17 MED ORDER — FENTANYL CITRATE 0.05 MG/ML IJ SOLN
INTRAMUSCULAR | Status: AC
  Filled 2013-12-17: qty 5

## 2013-12-17 MED ORDER — POTASSIUM CHLORIDE CRYS ER 20 MEQ PO TBCR: 20.0000 meq | EXTENDED_RELEASE_TABLET | Freq: Every day | ORAL | Status: DC | PRN

## 2013-12-17 MED ORDER — MORPHINE SULFATE 2 MG/ML IJ SOLN: 2.0000 mg | INTRAMUSCULAR | Status: DC | PRN

## 2013-12-17 MED ORDER — ONDANSETRON HCL 4 MG/2ML IJ SOLN: 4.0000 mg | Freq: Four times a day (QID) | INTRAMUSCULAR | Status: DC | PRN

## 2013-12-17 MED ORDER — ATORVASTATIN CALCIUM 20 MG PO TABS
20.0000 mg | ORAL_TABLET | Freq: Every day | ORAL | Status: DC
  Administered 2013-12-17 – 2013-12-18 (×2): 20 mg via ORAL
  Filled 2013-12-17 (×4): qty 1

## 2013-12-17 MED ORDER — DEXTROSE 5 % IV SOLN
1.5000 g | Freq: Two times a day (BID) | INTRAVENOUS | Status: AC
  Administered 2013-12-17 – 2013-12-18 (×2): 1.5 g via INTRAVENOUS
  Filled 2013-12-17 (×2): qty 1.5

## 2013-12-17 MED ORDER — PANTOPRAZOLE SODIUM 40 MG PO TBEC
40.0000 mg | DELAYED_RELEASE_TABLET | Freq: Every day | ORAL | Status: DC
  Administered 2013-12-17 – 2013-12-18 (×2): 40 mg via ORAL
  Filled 2013-12-17 (×2): qty 1

## 2013-12-17 MED ORDER — LABETALOL HCL 5 MG/ML IV SOLN: 10.0000 mg | INTRAVENOUS | Status: DC | PRN

## 2013-12-17 MED ORDER — GLYCOPYRROLATE 0.2 MG/ML IJ SOLN
INTRAMUSCULAR | Status: DC | PRN
  Administered 2013-12-17: 0.4 mg via INTRAVENOUS

## 2013-12-17 MED ORDER — SODIUM CHLORIDE 0.9 % IR SOLN
Status: DC | PRN
  Administered 2013-12-17: 11:00:00

## 2013-12-17 MED ORDER — 0.9 % SODIUM CHLORIDE (POUR BTL) OPTIME
TOPICAL | Status: DC | PRN
Start: 2013-12-17 — End: 2013-12-17
  Administered 2013-12-17: 2000 mL

## 2013-12-17 MED ORDER — LACTATED RINGERS IV SOLN
INTRAVENOUS | Status: DC | PRN
  Administered 2013-12-17 (×2): via INTRAVENOUS

## 2013-12-17 MED ORDER — PROMETHAZINE HCL 25 MG/ML IJ SOLN: 6.2500 mg | INTRAMUSCULAR | Status: DC | PRN

## 2013-12-17 MED ORDER — PHENYLEPHRINE 40 MCG/ML (10ML) SYRINGE FOR IV PUSH (FOR BLOOD PRESSURE SUPPORT)
PREFILLED_SYRINGE | INTRAVENOUS | Status: AC
  Filled 2013-12-17: qty 10

## 2013-12-17 MED ORDER — OXYCODONE HCL 5 MG/5ML PO SOLN: 5.0000 mg | Freq: Once | ORAL | Status: DC | PRN

## 2013-12-17 SURGICAL SUPPLY — 45 items
BAG ISOLATION DRAPE 18X18 (DRAPES) ×1 IMPLANT
BLADE 10 SAFETY STRL DISP (BLADE) ×2 IMPLANT
CANISTER SUCTION 2500CC (MISCELLANEOUS) ×2 IMPLANT
CLIP TI MEDIUM 24 (CLIP) ×2 IMPLANT
CLIP TI WIDE RED SMALL 24 (CLIP) ×2 IMPLANT
COVER SURGICAL LIGHT HANDLE (MISCELLANEOUS) ×2 IMPLANT
DERMABOND ADVANCED (GAUZE/BANDAGES/DRESSINGS) ×1
DERMABOND ADVANCED .7 DNX12 (GAUZE/BANDAGES/DRESSINGS) ×1 IMPLANT
DRAIN SNY WOU (WOUND CARE) IMPLANT
DRAPE ISOLATION BAG 18X18 (DRAPES) ×1
DRAPE WARM FLUID 44X44 (DRAPE) ×2 IMPLANT
ELECT REM PT RETURN 9FT ADLT (ELECTROSURGICAL) ×2
ELECTRODE REM PT RTRN 9FT ADLT (ELECTROSURGICAL) ×1 IMPLANT
EVACUATOR SILICONE 100CC (DRAIN) IMPLANT
GLOVE BIO SURGEON STRL SZ 6.5 (GLOVE) ×2 IMPLANT
GLOVE BIO SURGEON STRL SZ7.5 (GLOVE) ×2 IMPLANT
GLOVE BIOGEL PI IND STRL 6.5 (GLOVE) ×3 IMPLANT
GLOVE BIOGEL PI IND STRL 7.5 (GLOVE) ×1 IMPLANT
GLOVE BIOGEL PI INDICATOR 6.5 (GLOVE) ×3
GLOVE BIOGEL PI INDICATOR 7.5 (GLOVE) ×1
GLOVE SURG SS PI 7.5 STRL IVOR (GLOVE) ×2 IMPLANT
GOWN STRL REUS W/ TWL LRG LVL3 (GOWN DISPOSABLE) ×3 IMPLANT
GOWN STRL REUS W/TWL LRG LVL3 (GOWN DISPOSABLE) ×3
KIT BASIN OR (CUSTOM PROCEDURE TRAY) ×2 IMPLANT
KIT ROOM TURNOVER OR (KITS) ×2 IMPLANT
NS IRRIG 1000ML POUR BTL (IV SOLUTION) ×4 IMPLANT
PACK PERIPHERAL VASCULAR (CUSTOM PROCEDURE TRAY) ×2 IMPLANT
PAD ARMBOARD 7.5X6 YLW CONV (MISCELLANEOUS) ×4 IMPLANT
SPONGE SURGIFOAM ABS GEL 100 (HEMOSTASIS) IMPLANT
STAPLER VISISTAT 35W (STAPLE) IMPLANT
SUT PROLENE 5 0 C 1 24 (SUTURE) ×2 IMPLANT
SUT PROLENE 6 0 CC (SUTURE) ×2 IMPLANT
SUT SILK 3 0 (SUTURE)
SUT SILK 3-0 18XBRD TIE 12 (SUTURE) IMPLANT
SUT VIC AB 2-0 CT1 27 (SUTURE) ×1
SUT VIC AB 2-0 CT1 TAPERPNT 27 (SUTURE) ×1 IMPLANT
SUT VIC AB 3-0 SH 27 (SUTURE) ×1
SUT VIC AB 3-0 SH 27X BRD (SUTURE) ×1 IMPLANT
SUT VICRYL 4-0 PS2 18IN ABS (SUTURE) ×2 IMPLANT
TAPE UMBILICAL COTTON 1/8X30 (MISCELLANEOUS) IMPLANT
TOWEL OR 17X24 6PK STRL BLUE (TOWEL DISPOSABLE) ×4 IMPLANT
TOWEL OR 17X26 10 PK STRL BLUE (TOWEL DISPOSABLE) ×2 IMPLANT
TRAY FOLEY CATH 16FRSI W/METER (SET/KITS/TRAYS/PACK) ×2 IMPLANT
UNDERPAD 30X30 INCONTINENT (UNDERPADS AND DIAPERS) ×2 IMPLANT
WATER STERILE IRR 1000ML POUR (IV SOLUTION) ×2 IMPLANT

## 2013-12-17 NOTE — Progress Notes (Signed)
Wedding band removed and given to husband at bedside

## 2013-12-17 NOTE — Interval H&P Note (Signed)
History and Physical Interval Note:  12/17/2013 9:54 AM  April Guerra  has presented today for surgery, with the diagnosis of RIGHT FEMORAL ARTERY PSEUDOANEURYSM  The various methods of treatment have been discussed with the patient and family. After consideration of risks, benefits and other options for treatment, the patient has consented to  Procedure(s): REPAIR OF RIGHT FEMORAL ARTERY PSEUDOANEURYSM (Right) as a surgical intervention .  The patient's history has been reviewed, patient examined, no change in status, stable for surgery.  I have reviewed the patient's chart and labs.  Questions were answered to the patient's satisfaction.     FIELDS,CHARLES E

## 2013-12-17 NOTE — Anesthesia Preprocedure Evaluation (Addendum)
Anesthesia Evaluation  Patient identified by MRN, date of birth, ID band Patient awake    Reviewed: Allergy & Precautions, H&P , NPO status , Patient's Chart, lab work & pertinent test results, reviewed documented beta blocker date and time   History of Anesthesia Complications Negative for: history of anesthetic complications  Airway Mallampati: II TM Distance: >3 FB Neck ROM: Full    Dental  (+) Teeth Intact, Dental Advisory Given   Pulmonary asthma (last inhaler use 3-4 months ago) ,  breath sounds clear to auscultation  Pulmonary exam normal       Cardiovascular hypertension, Pt. on medications and Pt. on home beta blockers - angina+ Peripheral Vascular Disease (pseudoaneurysm at cath site) Rhythm:Regular Rate:Normal  12/16/13 cath: no sig disease, as per patient Pt reports normal stress test last week   Neuro/Psych negative neurological ROS     GI/Hepatic GERD-  Medicated and Controlled,Elevated LFTs   Endo/Other  Morbid obesity  Renal/GU negative Renal ROS     Musculoskeletal   Abdominal (+) + obese,   Peds  Hematology  (+) Blood dyscrasia (Hb 10.1), anemia ,   Anesthesia Other Findings   Reproductive/Obstetrics                        Anesthesia Physical Anesthesia Plan  ASA: III  Anesthesia Plan: General   Post-op Pain Management:    Induction: Intravenous  Airway Management Planned: Oral ETT  Additional Equipment:   Intra-op Plan:   Post-operative Plan: Extubation in OR  Informed Consent: I have reviewed the patients History and Physical, chart, labs and discussed the procedure including the risks, benefits and alternatives for the proposed anesthesia with the patient or authorized representative who has indicated his/her understanding and acceptance.   Dental advisory given  Plan Discussed with: CRNA and Surgeon  Anesthesia Plan Comments: (Plan routine monitors, GETA)         Anesthesia Quick Evaluation

## 2013-12-17 NOTE — Op Note (Signed)
Procedure: Repair of right femoral pseudoaneurysm  Preoperative diagnosis: Right femoral pseudoaneurysm  Post Operative diagnosis: Same  Anesthesia: Gen.  Assistant: Upton Cellar RN FA  Indications: Patient is a 68 year old female who underwent cardiac catheterization earlier this week. She presented to the emergency room last evening with hematoma and pain in the right groin. She had a duplex ultrasound which showed femoral pseudoaneurysm. She did not have an appropriate window to do injection and had failed compression management of her pseudoaneurysm.  Operative findings: Needle hole lateral aspect of the distal common femoral artery right at the takeoff of the SFA  Operative details: After obtaining informed consent, the patient was taken to the operating room. The patient was placed in supine position on the operating room table. After induction of general anesthesia and endotracheal intubation, a Foley catheter was placed. Next the patient's entire right lower extremity was prepped and draped in usual sterile fashion. An oblique incision was made in the right groin carried down through the subcutaneous tissues down to level of the right common femoral artery. The common femoral artery was dissected free circumferentially right at the level of the inguinal ligament. Dissection then proceeded down the anterior surface of the common femoral artery. The pseudoaneurysm cavity was encountered. There was pulsatile bleeding from this. Using digital control over the hole in the artery I was able to dissect free the superficial femoral and profunda femoris arteries below the level of the opening in the artery. Vessel loops were placed around this. The patient was given 8000 units of intravenous heparin. Vessel loops were used to control the artery proximally and distally. A 6-0 Prolene suture was used to repair a single hole in the artery. At completion the vessel loops were removed and there was good  pulsatile flow and Doppler flow in the profunda femoris and superficial femoral arteries as well as the common femoral artery. The wound was thoroughly irrigated with normal saline solution. Thrombin and Gelfoam were applied. The patient was given 100 mg of protamine for heparin reversal. Hemostasis was obtained. The thrombin and Gelfoam was removed. The deep layers the groin were closed in multiple layers of running 2-0 Vicryl suture. The superficial layer closed with running 3-0 Vicryl suture. Skin was closed with a 4 0 Vicryl subcuticular stitch. Dermabond was applied to the skin. The patient tolerated the procedure well and there were no complications. Instrument sponge and needle count was correct at the end of the case. Patient was taken to recovery room in stable condition.  Ruta Hinds, MD [facility] [place] Office: [phone] Pager: [phone]

## 2013-12-17 NOTE — Anesthesia Postprocedure Evaluation (Signed)
  Anesthesia Post-op Note  Patient: April Guerra  Procedure(s) Performed: Procedure(s): REPAIR OF RIGHT FEMORAL ARTERY PSEUDOANEURYSM (Right)  Patient Location: PACU  Anesthesia Type:General  Level of Consciousness: awake, alert , oriented and patient cooperative  Airway and Oxygen Therapy: Patient Spontanous Breathing and Patient connected to nasal cannula oxygen  Post-op Pain: none  Post-op Assessment: Post-op Vital signs reviewed, Patient's Cardiovascular Status Stable, Respiratory Function Stable, Patent Airway, No signs of Nausea or vomiting and Pain level controlled  Post-op Vital Signs: Reviewed and stable  Last Vitals:  Filed Vitals:   12/17/13 1345  BP:   Pulse: 71  Temp:   Resp: 16    Complications: No apparent anesthesia complications

## 2013-12-17 NOTE — Transfer of Care (Signed)
Immediate Anesthesia Transfer of Care Note  Patient: April Guerra  Procedure(s) Performed: Procedure(s): REPAIR OF RIGHT FEMORAL ARTERY PSEUDOANEURYSM (Right)  Patient Location: PACU  Anesthesia Type:General  Level of Consciousness: awake, alert  and oriented  Airway & Oxygen Therapy: Patient Spontanous Breathing and Patient connected to nasal cannula oxygen  Post-op Assessment: Report given to PACU RN, Post -op Vital signs reviewed and stable and Patient moving all extremities X 4  Post vital signs: Reviewed and stable  Complications: No apparent anesthesia complications

## 2013-12-17 NOTE — Anesthesia Procedure Notes (Signed)
Procedure Name: Intubation Date/Time: 12/17/2013 10:45 AM Performed by: Trixie Deis A Pre-anesthesia Checklist: Patient identified, Timeout performed, Emergency Drugs available, Suction available and Patient being monitored Patient Re-evaluated:Patient Re-evaluated prior to inductionOxygen Delivery Method: Circle system utilized Preoxygenation: Pre-oxygenation with 100% oxygen Intubation Type: IV induction Ventilation: Mask ventilation without difficulty Laryngoscope Size: Mac and 3 Grade View: Grade II Tube type: Oral Tube size: 7.0 mm Number of attempts: 2 Airway Equipment and Method: Bougie stylet Placement Confirmation: ETT inserted through vocal cords under direct vision,  breath sounds checked- equal and bilateral and positive ETCO2 Secured at: 21 cm Tube secured with: Tape Dental Injury: Teeth and Oropharynx as per pre-operative assessment

## 2013-12-18 LAB — BASIC METABOLIC PANEL
BUN: 6 mg/dL (ref 6–23)
CO2: 26 mEq/L (ref 19–32)
CREATININE: 0.59 mg/dL (ref 0.50–1.10)
Calcium: 8.9 mg/dL (ref 8.4–10.5)
Chloride: 95 mEq/L — ABNORMAL LOW (ref 96–112)
GFR calc non Af Amer: >90 mL/min (ref >90)
Glucose, Bld: 141 mg/dL — ABNORMAL HIGH (ref 70–99)
Potassium: 3.7 mEq/L (ref 3.7–5.3)
SODIUM: 135 meq/L — AB (ref 137–147)

## 2013-12-18 LAB — CBC
HCT: 29 % — ABNORMAL LOW (ref 36.0–46.0)
Hemoglobin: 10.1 g/dL — ABNORMAL LOW (ref 12.0–15.0)
MCH: 33.1 pg (ref 26.0–34.0)
MCHC: 34.8 g/dL (ref 30.0–36.0)
MCV: 95.1 fL (ref 78.0–100.0)
Platelets: 246 10*3/uL (ref 150–400)
RBC: 3.05 MIL/uL — ABNORMAL LOW (ref 3.87–5.11)
RDW: 12.6 % (ref 11.5–15.5)
WBC: 11 10*3/uL — AB (ref 4.0–10.5)

## 2013-12-18 MED ORDER — BISACODYL 10 MG RE SUPP
10.0000 mg | Freq: Every day | RECTAL | Status: DC | PRN
  Administered 2013-12-18: 10 mg via RECTAL
  Filled 2013-12-18: qty 1

## 2013-12-18 NOTE — Progress Notes (Signed)
[facility] [place]  Subjective  - Some soreness right groin and numbness inner thigh, constipated no BM for 5 days   Objective 131/49 79 97.8 F (36.6 C) (Oral) 16 97%  Intake/Output Summary (Last 24 hours) at 12/18/13 0958 Last data filed at 12/18/13 0933  Gross per 24 hour  Intake   1440 ml  Output   2110 ml  Net   -670 ml   Right groin incision clean no hematoma 2+ DP pulse right foot  Assessment/Planning: 1.  Pseudoaneurysm repair doing well, ambulate today d/c home tomorrow follow up with me in 2-3 weeks 2.  Constipation dulcolax today  FIELDS,CHARLES E 12/18/2013 9:58 AM --  Laboratory Lab Results:  Recent Labs  12/17/13 0415 12/18/13 0140  WBC 9.1 11.0*  HGB 10.1* 10.1*  HCT 29.3* 29.0*  PLT 268 246   BMET  Recent Labs  12/17/13 0415 12/18/13 0140  NA 135* 135*  K 4.4 3.7  CL 96 95*  CO2 26 26  GLUCOSE 96 141*  BUN 11 6  CREATININE 0.67 0.59  CALCIUM 9.0 8.9    COAG No results found for this basename: INR, PROTIME   No results found for this basename: PTT

## 2013-12-18 NOTE — Evaluation (Signed)
Physical Therapy Evaluation Patient Details Name: April Guerra MRN: [id number] DOB: 01/28/1946 Today's Date: 12/18/2013   History of Present Illness  Pt with right femoral pseudoanerysm s/p repair after heart catherization  Clinical Impression  Pt with chronic back pain from injury at work with pt educated for back precautions with transfers, function and gait. Pt currently with gait and transfer deficits related to back and acute right groin pain with pt educated for compensatory techniques and RW use. Pt will benefit from acute therapy to address above and below deficits to return pt to Independent level.    Follow Up Recommendations No PT follow up    Equipment Recommendations  Rolling walker with 5" wheels (youth RW)    Recommendations for Other Services       Precautions / Restrictions Precautions Precautions: Back Precaution Booklet Issued: Yes (comment) Precaution Comments: pt with chronic back pain, receiving therapy with sx planned      Mobility  Bed Mobility Overal bed mobility: Needs Assistance Bed Mobility: Rolling;Sidelying to Sit Rolling: Supervision Sidelying to sit: Supervision       General bed mobility comments: cues for sequence to decrease back and groin pain  Transfers Overall transfer level: Modified independent   Transfers: Sit to/from Stand              Ambulation/Gait Ambulation/Gait assistance: Supervision Ambulation Distance (Feet): 300 Feet Assistive device: Rolling walker (2 wheeled) Gait Pattern/deviations: Step-through pattern;Decreased stride length;Decreased stance time - right   Gait velocity interpretation: Below normal speed for age/gender    Stairs Stairs: Yes   Stair Management: One rail Right;One rail Left;Step to pattern;Forwards;Sideways Number of Stairs: 8 General stair comments: Pt ascended 4 steps sideways with right rail then 4 steps forward with left rail and HHA. descending forward with rail and  HHA  Wheelchair Mobility    Modified Rankin (Stroke Patients Only)       Balance Overall balance assessment: No apparent balance deficits (not formally assessed)                                           Pertinent Vitals/Pain 3/10 right groin pain at rest, 6/10 with ambulation, RN aware and repositioned    Home Living Family/patient expects to be discharged to:: Private residence Living Arrangements: Spouse/significant other Available Help at Discharge: Family;Available 24 hours/day Type of Home: House Home Access: Stairs to enter Entrance Stairs-Rails: Right Entrance Stairs-Number of Steps: 4 Home Layout: Two level Home Equipment: Cane - single point      Prior Function Level of Independence: Independent               Hand Dominance        Extremity/Trunk Assessment   Upper Extremity Assessment: Overall WFL for tasks assessed           Lower Extremity Assessment: RLE deficits/detail      Cervical / Trunk Assessment: Normal  Communication   Communication: No difficulties  Cognition Arousal/Alertness: Awake/alert Behavior During Therapy: WFL for tasks assessed/performed Overall Cognitive Status: Within Functional Limits for tasks assessed                      General Comments      Exercises        Assessment/Plan    PT Assessment Patient needs continued PT services  PT Diagnosis Difficulty walking;Acute pain   PT  Problem List Decreased activity tolerance;Pain;Decreased knowledge of use of DME  PT Treatment Interventions Gait training;Stair training;Functional mobility training;Therapeutic activities;Patient/family education;DME instruction   PT Goals (Current goals can be found in the Care Plan section) Acute Rehab PT Goals Patient Stated Goal: 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, 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 Goal Formulation: With patient/family Time For Goal Achievement: 12/25/13 Potential to Achieve Goals: Good    Frequency  Min 3X/week   Barriers to discharge        Co-evaluation               End of Session   Activity Tolerance: Patient tolerated treatment well Patient left: in chair;with call bell/phone within reach;with family/visitor present;with nursing/sitter in room Nurse Communication: Mobility status;Precautions         Time: 3567-0141 PT Time Calculation (min): 24 min   Charges:   PT Evaluation $Initial PT Evaluation Tier I: 1 Procedure PT Treatments $Therapeutic Activity: 8-22 mins   PT G Codes:          Melford Aase 12/18/2013, 11:51 AM Elwyn Reach, Jud

## 2013-12-18 NOTE — Progress Notes (Signed)
Occupational Therapy Evaluation Patient Details Name: Tatijana Bierly MRN: [id number] DOB: 1946-02-12 Today's Date: 12/18/2013    History of Present Illness Pt with right femoral pseudoanerysm s/p repair after heart catherization   Clinical Impression   PTA pt lived at home and was independent with ADLs and works as an Therapist, sports in [place].  Pt is ambulating at Mod Independent level and will have her husband to assist with LB ADLs until her surgical site heals. Pt has no ADL concerns, acute OT to sign off.     Follow Up Recommendations  No OT follow up;Supervision/Assistance - 24 hour    Equipment Recommendations  None recommended by OT       Precautions / Restrictions Precautions Precautions: Back Restrictions Weight Bearing Restrictions: No      Mobility Bed Mobility               General bed mobility comments: Pt sitting in recliner before/after OT session.  Transfers Overall transfer level: Modified independent Equipment used: Rolling walker (2 wheeled) Transfers: Sit to/from Stand                Balance Overall balance assessment: No apparent balance deficits (not formally assessed)                                          ADL Overall ADL's : Needs assistance/impaired Eating/Feeding: Independent;Sitting   Grooming: Supervision/safety;Standing   Upper Body Bathing: Set up;Sitting   Lower Body Bathing: Minimal assistance;Sit to/from stand   Upper Body Dressing : Set up;Sitting   Lower Body Dressing: Moderate assistance;Sit to/from stand   Toilet Transfer: Supervision/safety;Ambulation;RW   Toileting- Clothing Manipulation and Hygiene: Supervision/safety;Sit to/from stand   Tub/ Shower Transfer: Tub transfer;Min guard   Functional mobility during ADLs: Supervision/safety;Rolling walker General ADL Comments: Pt overall moving well at Supervision level with RW. Pt reports that she previously crossed her legs in figure four pattern for LB  ADLs, however groin pain may limit ability to do so. Pt reports that husband will assist with LB ADLs and she is not concerned. Educated pt and husband on fall prevention and energy conservation techniques.      Vision   Pt reports no change from baseline.    No apparent visual deficits.                  Perception Perception Perception Tested?: No   Praxis Praxis Praxis tested?: Within functional limits    Pertinent Vitals/Pain NAD        Extremity/Trunk Assessment Upper Extremity Assessment Upper Extremity Assessment: Overall WFL for tasks assessed   Lower Extremity Assessment Lower Extremity Assessment: Defer to PT evaluation   Cervical / Trunk Assessment Cervical / Trunk Assessment: Normal   Communication Communication Communication: No difficulties   Cognition Arousal/Alertness: Awake/alert Behavior During Therapy: WFL for tasks assessed/performed Overall Cognitive Status: Within Functional Limits for tasks assessed                                Home Living Family/patient expects to be discharged to:: Private residence Living Arrangements: Spouse/significant other Available Help at Discharge: Family;Available 24 hours/day Type of Home: House Home Access: Stairs to enter [employer] of Steps: 4 Entrance Stairs-Rails: Right Home Layout: Two level Alternate Level Stairs-Number of Steps: 4 Alternate Level Stairs-Rails: Left Bathroom Shower/Tub: Tub/shower  unit Shower/tub characteristics: Curtain Biochemist, clinical: Standard     Home Equipment: Cane - single point          Prior Functioning/Environment Level of Independence: Independent        Comments: Pt works as an Therapist, 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

## 2013-12-19 ENCOUNTER — Telehealth: Payer: Self-pay | Admitting: Vascular Surgery

## 2013-12-19 MED ORDER — SIMVASTATIN 40 MG PO TABS: 20.0000 mg | ORAL_TABLET | Freq: Every day | ORAL | Status: DC

## 2013-12-19 MED ORDER — HYDROCODONE-ACETAMINOPHEN 5-325 MG PO TABS: 1.0000 | ORAL_TABLET | Freq: Every day | ORAL | Status: DC | PRN

## 2013-12-19 MED ORDER — ATORVASTATIN CALCIUM 20 MG PO TABS: 20.0000 mg | ORAL_TABLET | Freq: Every day | ORAL | Status: DC

## 2013-12-19 MED ORDER — OXYCODONE HCL 5 MG PO TABS: 5.0000 mg | ORAL_TABLET | Freq: Four times a day (QID) | ORAL | Status: DC | PRN

## 2013-12-19 NOTE — Discharge Summary (Signed)
[facility] Discharge Summary  April Guerra 1946-01-17 67 y.o. female  [id number]  Admission Date: 12/16/2013  Discharge Date: 12/19/2013  Physician: Elam Dutch, MD  Admission Diagnosis: Pseudoaneurysm following procedure [997.79, 442.9]   HPI:   This is a 68 y.o. female who presented for evaluation of right femoral pseudoaneurysm. Pt had cardiac cath in [facility] by Dr Sondra Come. She had some bleeding post procedure but eventually went home. Began to have more pain and swelling in groin  and went to [place]. She requested transfer to [facility] rather than going to see her cardiolgist in [place]. Pt found to have right femoral pseudoaneurysm on [place] at [facility]. She continued to have mild pain in groin currently but no expanding mass. Per pt no significant coronary disease on cath. Other medical problems include hypertension and asthma which are stable.  Hospital Course:  The patient was admitted to the hospital and taken to the operating room on 12/17/2013 and underwent: repair of right femoral pseudoaneurysm.   The pt tolerated the procedure well and was transported to the PACU in good condition.   On POD 1, she was having some soreness in right groin and numbness of her inner thigh. She was able to ambulate well with a rolling walker.   On POD 2, her groin soreness was unchanged. The groin incision was healing well and she had a palpable 2+ DP pulse.  The remainder of the hospital course consisted of increasing mobilization and increasing intake of solids without difficulty. She was discharged home on POD 2 in good condition.   CBC    Component Value Date/Time   WBC 11.0* 12/18/2013 0140   RBC 3.05* 12/18/2013 0140   HGB 10.1* 12/18/2013 0140   HCT 29.0* 12/18/2013 0140   PLT 246 12/18/2013 0140   MCV 95.1 12/18/2013 0140   MCH 33.1 12/18/2013 0140   MCHC 34.8 12/18/2013 0140   RDW 12.6 12/18/2013 0140   LYMPHSABS 1.4 12/16/2013 1740   MONOABS 0.8  12/16/2013 1740   EOSABS 0.0 12/16/2013 1740   BASOSABS 0.0 12/16/2013 1740    BMET    Component Value Date/Time   NA 135* 12/18/2013 0140   K 3.7 12/18/2013 0140   CL 95* 12/18/2013 0140   CO2 26 12/18/2013 0140   GLUCOSE 141* 12/18/2013 0140   BUN 6 12/18/2013 0140   CREATININE 0.59 12/18/2013 0140   CALCIUM 8.9 12/18/2013 0140   GFRNONAA >90 12/18/2013 0140   GFRAA >90 12/18/2013 0140     Discharge Instructions:   The patient is discharged with extensive instructions on wound care and progressive ambulation.  They are instructed not to drive or perform any heavy lifting until returning to see the physician in his office.  Discharge Instructions   Call MD for:  redness, tenderness, or signs of infection (pain, swelling, bleeding, redness, odor or green/yellow discharge around incision site)    Complete by:  As directed      Call MD for:  severe or increased pain, loss or decreased feeling  in affected limb(s)    Complete by:  As directed      Call MD for:  temperature >100.5    Complete by:  As directed      Driving Restrictions    Complete by:  As directed   No driving for 2 weeks     Lifting restrictions    Complete by:  As directed   No lifting for 4 weeks  Resume previous diet    Complete by:  As directed      may wash over wound with mild soap and water    Complete by:  As directed   Wash the groin wound with soap and water daily and pat dry. (No tub bath-only shower)  Then put a dry gauze or washcloth there to keep this area dry daily and as needed.  Do not use Vaseline or neosporin on your incisions.  Only use soap and water on your incisions and then protect and keep dry.           Discharge Diagnosis:  Pseudoaneurysm following procedure [997.79, 442.9]  Secondary Diagnosis: Patient Active Problem List   Diagnosis Date Noted  . Pseudoaneurysm of femoral artery 12/16/2013   Past Medical History  Diagnosis Date  . Hypertension   . Asthma        Medication  List         acetaminophen 500 MG tablet  Commonly known as:  TYLENOL  Take 500-1,000 mg by mouth 2 (two) times daily. 2 tablets in the morning and 1 tablet in the evening.     albuterol 108 (90 BASE) MCG/ACT inhaler  Commonly known as:  PROVENTIL HFA;VENTOLIN HFA  Inhale 2 puffs into the lungs every 6 (six) hours as needed for wheezing or shortness of breath.     amLODipine 5 MG tablet  Commonly known as:  NORVASC  Take 5 mg by mouth daily.     aspirin 325 MG tablet  Take 325 mg by mouth every other day.     calcium carbonate 600 MG Tabs tablet  Commonly known as:  OS-CAL  Take 600 mg by mouth 2 (two) times daily with a meal.     CENTRUM SILVER ADULT 50+ PO  Take 1 tablet by mouth daily.     cetirizine 10 MG tablet  Commonly known as:  ZYRTEC  Take 10 mg by mouth daily.     cholecalciferol 1000 UNITS tablet  Commonly known as:  VITAMIN D  Take 2,000 Units by mouth daily.     cyclobenzaprine 5 MG tablet  Commonly known as:  FLEXERIL  Take 5 mg by mouth daily as needed for muscle spasms.     diphenhydrAMINE 25 mg capsule  Commonly known as:  BENADRYL  Take 12.5-25 mg by mouth once.     HYDROcodone-acetaminophen 5-325 MG per tablet  Commonly known as:  NORCO/VICODIN  Take 1 tablet by mouth daily as needed for moderate pain.     lisinopril 20 MG tablet  Commonly known as:  PRINIVIL,ZESTRIL  Take 20 mg by mouth daily.     Melatonin 5 MG Caps  Take 1 capsule by mouth at bedtime.     metoprolol succinate 100 MG 24 hr tablet  Commonly known as:  TOPROL-XL  Take 100 mg by mouth daily. Take with or immediately following a meal.     oxyCODONE 5 MG immediate release tablet  Commonly known as:  Oxy IR/ROXICODONE  Take 1 tablet (5 mg total) by mouth every 6 (six) hours as needed for severe pain.     ranitidine 150 MG tablet  Commonly known as:  ZANTAC  Take 150 mg by mouth 2 (two) times daily.     simvastatin 40 MG tablet  Commonly known as:  ZOCOR  Take 0.5  tablets (20 mg total) by mouth daily.     vitamin C 500 MG tablet  Commonly known as:  ASCORBIC ACID  Take 500 mg by mouth daily.        Roxicodone #30 No Refill  Disposition: Home  Patient's condition: is Good  Follow up: 1. Dr. Oneida Alar in 2 weeks   April Jock, PA-C [facility] [phone] 12/19/2013  8:34 AM

## 2013-12-19 NOTE — Progress Notes (Signed)
[facility] [place]  Subjective  - Some groin soreness inner thigh numbness similar to yesterday   Objective 124/50 78 98.7 F (37.1 C) (Oral) 16 96%  Intake/Output Summary (Last 24 hours) at 12/19/13 0713 Last data filed at 12/18/13 0933  Gross per 24 hour  Intake    240 ml  Output    250 ml  Net    -10 ml   Ecchymosis groin unchanged No hematoma Incision healing 2+ DP pulse   Assessment/Planning: D/c home today Follow up 2 weeks  April Guerra E 12/19/2013 7:13 AM --  Laboratory Lab Results:  Recent Labs  12/17/13 0415 12/18/13 0140  WBC 9.1 11.0*  HGB 10.1* 10.1*  HCT 29.3* 29.0*  PLT 268 246   BMET  Recent Labs  12/17/13 0415 12/18/13 0140  NA 135* 135*  K 4.4 3.7  CL 96 95*  CO2 26 26  GLUCOSE 96 141*  BUN 11 6  CREATININE 0.67 0.59  CALCIUM 9.0 8.9    COAG No results found for this basename: INR, PROTIME   No results found for this basename: PTT

## 2013-12-19 NOTE — Progress Notes (Signed)
UR Completed Brenda Graves-Bigelow, RN,BSN [phone]  

## 2013-12-19 NOTE — Care Management Note (Signed)
    Page 1 of 1   12/19/2013     10:00:23 AM CARE MANAGEMENT NOTE 12/19/2013  Patient:  April Guerra, April Guerra   Account Number:  [id number]  Date Initiated:  12/19/2013  Documentation initiated by:  GRAVES-BIGELOW,BRENDA  Subjective/Objective Assessment:   Pt admitted for REPAIR OF RIGHT FEMORAL ARTERY PSEUDOANEURYSM.     Action/Plan:   CM will continue to monitor for disposition needs.   Anticipated DC Date:  12/19/2013   Anticipated DC Plan:  HOME/SELF CARE      DC Planning Services  CM consult      PAC Choice  DURABLE MEDICAL EQUIPMENT   Choice offered to / List presented to:  C-1 Patient   DME arranged  Vassie Moselle      DME agency  [place].        Status of service:  Completed, signed off Medicare Important Message given?  YES (If response is "NO", the following Medicare IM given date fields will be blank) Date Medicare IM given:  12/19/2013 Date Additional Medicare IM given:    Discharge Disposition:    Per UR Regulation:  Reviewed for med. necessity/level of care/duration of stay  If discussed at [place] of Stay Meetings, dates discussed:    Comments:  12-19-13 0959 Jacqlyn Krauss, RN,BSN [place] walker to be delivered to room before d/c via [facility]. No further needs from CM at this time.

## 2013-12-19 NOTE — Discharge Instructions (Signed)
° ° ° ° ° ° ° ° ° ° ° ° ° ° ° ° ° ° ° ° ° °  Pt is not to return to work for at least 2 weeks when she see's Dr. Oneida Alar back in his office.  Further recommendations will be made at that time depending on her progress.  Leontine Locket, PA-C 12/19/2013 9:41 AM

## 2013-12-19 NOTE — Telephone Encounter (Addendum)
Message copied by Gena Fray on Mon Dec 19, 2013  3:58 PM ------      Message from: Peter Minium K      Created: Mon Dec 19, 2013  9:38 AM      Regarding: Schedule                   ----- Message -----         From: Gabriel Earing, PA-C         Sent: 12/19/2013   8:32 AM           To: [facility] Charge Pool            S/p  Repair of right femoral pseudoaneurysm. F/u with Dr. Oneida Alar in 2 weeks.            Thanks      Samantha ------  12/19/13: spoke with pt- she knows to call if she has problems arise. Zigmund Daniel ok'd 4 weeks, as CEF is out of office 2 weeks)

## 2013-12-20 ENCOUNTER — Encounter (HOSPITAL_COMMUNITY): Payer: Self-pay | Admitting: Vascular Surgery

## 2013-12-20 ENCOUNTER — Telehealth: Payer: Self-pay

## 2013-12-20 ENCOUNTER — Emergency Department (HOSPITAL_COMMUNITY): Payer: No Typology Code available for payment source

## 2013-12-20 ENCOUNTER — Emergency Department (HOSPITAL_COMMUNITY)
Admission: EM | Admit: 2013-12-20 | Discharge: 2013-12-20 | Disposition: A | Discharge status: Home / Self Care | Payer: No Typology Code available for payment source | Attending: Emergency Medicine | Admitting: Emergency Medicine

## 2013-12-20 DIAGNOSIS — Z9889 Other specified postprocedural states: Secondary | ICD-10-CM | POA: Insufficient documentation

## 2013-12-20 DIAGNOSIS — K5900 Constipation, unspecified: Secondary | ICD-10-CM | POA: Diagnosis not present

## 2013-12-20 DIAGNOSIS — Z9089 Acquired absence of other organs: Secondary | ICD-10-CM | POA: Insufficient documentation

## 2013-12-20 DIAGNOSIS — Z79899 Other long term (current) drug therapy: Secondary | ICD-10-CM | POA: Insufficient documentation

## 2013-12-20 DIAGNOSIS — I10 Essential (primary) hypertension: Secondary | ICD-10-CM | POA: Insufficient documentation

## 2013-12-20 DIAGNOSIS — J45909 Unspecified asthma, uncomplicated: Secondary | ICD-10-CM | POA: Insufficient documentation

## 2013-12-20 DIAGNOSIS — Z7982 Long term (current) use of aspirin: Secondary | ICD-10-CM | POA: Insufficient documentation

## 2013-12-20 MED ORDER — ONDANSETRON 8 MG PO TBDP: 8.0000 mg | ORAL_TABLET | Freq: Once | ORAL | Status: DC

## 2013-12-20 MED ORDER — FLEET ENEMA 7-19 GM/118ML RE ENEM
1.0000 | ENEMA | Freq: Once | RECTAL | Status: AC
  Administered 2013-12-20: 1 via RECTAL

## 2013-12-20 NOTE — ED Notes (Signed)
Pt co constipation, last BM Wednesday.

## 2013-12-20 NOTE — Telephone Encounter (Signed)
Phone call from pt. With c/o constipation.  Reports she hasn't had BM since Wednesday, 6/24.  States she rec'd stool softeners and a suppositories while in hospital with no results.  Reports she taken Dulcolax tabs x 2 and Glycerine supp. x2 since she has been home, but no results.  Denies abdominal bloating or nausea/vomiting.  States is passing gas.  Reports she feels a hard ball of stool in the rectum, but unable to pass.  Recently gave herself a Fleets Enema; able to retain approx. 2/3 of the fluid, but not able to pass hard ball of stool.  Advised to try a Fleets Oil Retention Enema to assist evacuation of the hard stool.  Discussed with Dr. Kellie Simmering.  Agreed with trying oil retention enema, and if no success, should go to her medical doctor, or the ER for disimpaction.  Phone call to pt. To give Dr. Evelena Leyden recommendations.  Spoke w/ husband.  Verb. Understanding.  States he will get an oil retention enema for the pt.

## 2013-12-20 NOTE — ED Notes (Signed)
Pt. On toilet for approx. 25 min, no much more relief, small amount of stool per pt. She reports nausea is a little better now and wants to wait on taking the zofran.

## 2013-12-20 NOTE — Discharge Instructions (Signed)
°Emergency Department Resource Guide °1) Find a Doctor and Pay Out of Pocket °Although you won't have to find out who is covered by your insurance plan, it is a good idea to ask around and get recommendations. You will then need to call the office and see if the doctor you have chosen will accept you as a new patient and what types of options they offer for patients who are self-pay. Some doctors offer discounts or will set up payment plans for their patients who do not have insurance, but you will need to ask so you aren't surprised when you get to your appointment. ° °2) Contact Your Local Health Department °Not all health departments have doctors that can see patients for sick visits, but many do, so it is worth a call to see if yours does. If you don't know where your local health department is, you can check in your phone book. The CDC also has a tool to help you locate your state's health department, and many state websites also have listings of all of their local health departments. ° °3) Find a Walk-in Clinic °If your illness is not likely to be very severe or complicated, you may want to try a walk in clinic. These are popping up all over the country in pharmacies, drugstores, and shopping centers. They're usually staffed by nurse practitioners or physician assistants that have been trained to treat common illnesses and complaints. They're usually fairly quick and inexpensive. However, if you have serious medical issues or chronic medical problems, these are probably not your best option. ° °No Primary Care Doctor: °- Call Health Connect at  832-8000 - they can help you locate a primary care doctor that  accepts your insurance, provides certain services, etc. °- Physician Referral Service- 1-800-533-3463 ° °Chronic Pain Problems: °Organization         Address  Phone   Notes  °[facility]  (336) 297-2271 Patients need to be referred by their primary care doctor.  ° °Medication  Assistance: °Organization         Address  Phone   Notes  °[place] [facility] [address] °[place], [place] [address] (336) 641-8030 --Must be a resident of [place] °-- Must have NO insurance coverage whatsoever (no Medicaid/ Medicare, etc.) °-- The pt. MUST have a primary care doctor that directs their care regularly and follows them in the community °  °[facility]  (866) 331-1348   °[employer]  (888) 892-1162   ° °Agencies that provide inexpensive medical care: °Organization         Address  Phone   Notes  °[facility]  (336) 832-8035   °[facility]    (336) 832-7272   °[facility] [address] °[place], [place] [address] (336) 832-4777   °[facility] [address], °[place] (336) 271-4999   °[facility]    (336) 373-0678   °[facility]    (336) 272-1050   °[facility] ° [address], [place] Phone:  (336) 832-4444, Fax:  (336) 832-4440 Hours of Operation:  9 am - 6 pm, M-F.  Also accepts Medicaid/Medicare and self-pay.  °[facility] ° [address], [place] Phone: (336) 832-3150, Fax: (336) 832-3151. Hours of Operation:  8:30 am - 5:30 pm, M-F.  Also accepts Medicaid and self-pay.  °[facility] [place] [address], [place] Phone: (336) 878-6027   °[facility] [address], [place], [place] (336)723-1848, Ext. 123 Mondays & Thursdays: 7-9 AM.  First 15 patients are seen on a first come, first serve basis. °  ° °Medicaid-accepting [place] Providers: ° °Organization         Address  Phone   Notes  °[facility] [address], [place] (336) 641-2100 Also accepts self-pay patients.  °[facility] [address], [place] ° (336) 856-9996   °[facility] [address], [place]  (336) 288-8857   °[facility] [address] [place] [address], [place] (336) 299-7000   °Veita Bland [address], [place]  ° (336) 373-1557 Only accepts [place] Access Medicaid patients after they have their name applied to their card.  ° °Self-Pay (no insurance) in [place]: ° °Organization         Address  Phone   Notes  °Sickle Cell Patients, [facility] [address], [place] (336) 832-1970   °[facility] [address], [place] (336) 832-4400   °[facility] ° [address], [place] (336) 992-4800   °[facility]/Dr. Osei-Bonsu ° [address] [place] [address], [place] or [address], [place] (336) [phone] Phone number for both [place] and [place] locations is the same.  °[facility] [address], [place] (336) 299-0000   °[facility] [address] [place] [address], [place] or [address] (336) 852-7530 °(336) 878-2260   °[facility] [address], [place] (336) 350-1642, phone; (336) 294-5005, fax Sees patients 1st and 3rd Saturday of every month.  Must not qualify for public or private insurance (i.e. Medicaid, Medicare, [place] Health Choice, Veterans' Benefits) • Household income should be no more than 200% of the poverty level •The clinic cannot treat you if you are pregnant or think you are pregnant • Sexually transmitted diseases are not treated at the clinic.  ° ° °Dental Care: °Organization         Address  Phone  Notes  °[place] [facility] [address], [place] (336) 641-6152 Accepts children up to age 21 who are enrolled in Medicaid or [place] Health Choice; pregnant women with a Medicaid card; and children who have applied for Medicaid or [place] Health Choice, but were declined, whose parents can pay a reduced fee at time of service.  °[place]  [facility] [place]  [address], [place] (336) 641-7733 Accepts children up to age 21 who are enrolled in Medicaid or [place] Health Choice; pregnant women with a Medicaid card; and children who have applied for Medicaid or [place] Health Choice, but were declined, whose parents can pay a reduced fee at time of service.  °[facility] ° [address], [place] (336) 641-4533 Patients are seen by appointment only. Walk-ins are not accepted. [facility] will see patients 18 years of age and older. °Monday - Tuesday (8am-5pm) °Most Wednesdays (8:30-5pm) °$30 per visit, cash only  °[facility] ° [address], [place] (336) 641-4533 Patients are seen by appointment only. Walk-ins are not accepted. [facility] will see patients 18 years of age and older. °One   Wednesday Evening (Monthly: Volunteer Based).  $30 per visit, cash only  °[facility]  (919) 537-3737 for adults; Children under age 4, call Graduate Pediatric Dentistry at (919) [phone]. Children aged 4-14, please call (919) [phone] to request a pediatric application. ° Dental services are provided in all areas of dental care including fillings, crowns and bridges, complete and partial dentures, implants, gum treatment, root canals, and extractions. Preventive care is also provided. Treatment is provided to both adults and children. °Patients are selected via a lottery and there is often a waiting list. °  °[facility] [address], °[place] ° (336) 763-8833 www.drcivils.com °  °[facility] [address], [place], [place] (336)723-1848, Ext. 123 Second and Fourth Thursday of each month, opens at 6:30 AM; Clinic ends at 9 AM.  Patients are seen on a first-come first-served basis, and a limited number are seen during each clinic.  ° °[facility] ° [address], [place], [place] (336) 723-7904    Eligibility Requirements °You must have lived in [place], or [place] counties for at least the last three months. °  You cannot be eligible for state or federal sponsored healthcare insurance, including Veterans Administration, Medicaid, or Medicare. °  You generally cannot be eligible for healthcare insurance through your employer.  °  How to apply: °Eligibility screenings are held every Tuesday and Wednesday afternoon from 1:00 pm until 4:00 pm. You do not need an appointment for the interview!  °[facility] [address], [place], [place] 336-631-2330   °[facility]  336-342-8273   °[facility]  336-703-3100   °[facility]  336-570-6415   ° °Behavioral Health Resources in the Community: °Intensive Outpatient Programs °Organization         Address  Phone  Notes  °[facility] [address], [place], [place] 336-878-6098   °[facility] [address], [place], [place] 336-832-9800   °[facility] [address], [place], [place] ° 336-882-2125   °[facility] [address],  °[place], [place] 1-800-853-5163 or 336-641-4981   °Substance Abuse Resources °Organization         Address  Phone  Notes  °[facility]  336-882-2125   °[facility]  336-784-9470   °[facility]  336-285-9073   °[facility]  336-845-3988   °Residential & Outpatient Substance Abuse Program  1-800-659-3381   °Psychological Services °Organization         Address  Phone  Notes  °[facility]  336- 832-9600   °[facility]  336- 378-7881   °[facility] [address], [place] 1-800-853-5163 or 336-641-4981   ° °Mobile Crisis Teams °Organization         Address  Phone  Notes  °[facility], Mobile Crisis Care Unit  1-877-626-1772   °Assertive °[facility] ° [address]  [place], [place] 336-834-9664   °Sharon DeEsch [address] °[place] 336-554-5454   ° °Self-Help/Support Groups °Organization         Address  Phone             Notes  °[facility] [place] - variety of support groups  336- 373-1402 Call for more information  °Narcotics Anonymous (NA), [facility] [address], °[place]  2 meetings at this location  ° °  Residential Treatment Programs Organization         Address  Phone  Notes  [facility] [address],    [place]  1-346 875 3941   [facility]  [address], [place] [address], [place], [place]   [place], [place] 330-046-5972 Admissions: 8am-3pm M-F  [facility] [address]    [place], [place] 282-081-3887   [facility] [address] [place], [place], [place]   [facility] [address],  [place], [place]   [facility] Intensive Outpatient [address], [place], [place]   [facility] ([place]  [place], [place] 1-314-513-7467 or 9861175720   [facility] [address], [place], [place] Accepts Medicaid  [facility] [address],  [place] 1-973-356-3378 Substance Abuse/Addiction Treatment   [facility] Organization         Address  Phone  Notes  [facility]  [phone]   Domenic Schwab, PhD [address] [place], [place]   302-393-0788 or (607) 753-3982   [facility] [place], [place] 612-344-8164   [facility] [address], [place], [place] [phone] Insurance/Medicaid/sponsorship through [facility] [address] [place]                                    [place], [place] [phone] [place] [address], [place] [phone]    Dr. Adele Schilder  [phone]   [facility] Dept. 1) [address] [place] 2) [place] 3)  [address], [place] [phone]  [phone]   [place] [phone] or [phone] (After Hours)       Take over the counter laxative (such as miralax, milk of magnesia, senokot) AND a dulcolax suppository (or enema) tomorrow and repeat both the next day.  Begin to take over the counter stool softener (colace), as directed on packaging, for the next month.  Continue to take your usual prescriptions as previously directed.  Call your regular medical doctor tomorrow to schedule a follow up appointment within the next 2 days.  Return to the Emergency Department immediately if worsening.

## 2013-12-20 NOTE — ED Notes (Signed)
Up to bathroom, passing flatus

## 2013-12-20 NOTE — ED Provider Notes (Signed)
CSN: [id number]     Arrival date & time 12/20/13  35 History   First MD Initiated Contact with Patient 12/20/13 1807     Chief Complaint  Patient presents with  . Constipation      HPI Pt was seen at 1810.  Per pt, c/o gradual onset and persistence of constant "constipation" since 12/14/2013. Pt states she has taken several ducolax suppositories today, as well as fleets enema and magnesium citrate. Pt states she has passed flatus but "it feels like there is a ball of stool up there." States she "tried to break it up on my own" but could not. States after she drank the magnesium citrate she vomited once and "still feels a little nauseated." Denies rectal bleeding, no rectal discharge, no abd pain, no flank pain, no fevers.    Past Medical History  Diagnosis Date  . Hypertension   . Asthma    Past Surgical History  Procedure Laterality Date  . Cholecystectomy    . Cardiac catheterization    . Breast surgery    . Thrombectomy femoral artery Right 12/17/2013    Procedure: REPAIR OF RIGHT FEMORAL ARTERY PSEUDOANEURYSM;  Surgeon: Elam Dutch, MD;  Location: [facility];  Service: Vascular;  Laterality: Right;    History  Substance Use Topics  . Smoking status: Never Smoker   . Smokeless tobacco: Not on file  . Alcohol Use: No    Review of Systems ROS: Statement: All systems negative except as marked or noted in the HPI; Constitutional: Negative for fever and chills. ; ; Eyes: Negative for eye pain, redness and discharge. ; ; ENMT: Negative for ear pain, hoarseness, nasal congestion, sinus pressure and sore throat. ; ; Cardiovascular: Negative for chest pain, palpitations, diaphoresis, dyspnea and peripheral edema. ; ; Respiratory: Negative for cough, wheezing and stridor. ; ; Gastrointestinal: +constipation, fecal impaction. Negative for nausea, vomiting, diarrhea, abdominal pain, blood in stool, hematemesis, jaundice and rectal bleeding. . ; ; Genitourinary: Negative for dysuria, flank  pain and hematuria. ; ; Musculoskeletal: Negative for back pain and neck pain. Negative for swelling and trauma.; ; Skin: Negative for pruritus, rash, abrasions, blisters, bruising and skin lesion.; ; Neuro: Negative for headache, lightheadedness and neck stiffness. Negative for weakness, altered level of consciousness , altered mental status, extremity weakness, paresthesias, involuntary movement, seizure and syncope.      Allergies  Review of patient's allergies indicates no known allergies.  Home Medications   Prior to Admission medications   Medication Sig Start Date End Date Taking? Authorizing Provider  acetaminophen (TYLENOL) 500 MG tablet Take 500-1,000 mg by mouth 2 (two) times daily. 2 tablets in the morning and 1 tablet in the evening.    Historical Provider, MD  albuterol (PROVENTIL HFA;VENTOLIN HFA) 108 (90 BASE) MCG/ACT inhaler Inhale 2 puffs into the lungs every 6 (six) hours as needed for wheezing or shortness of breath.    Historical Provider, MD  amLODipine (NORVASC) 5 MG tablet Take 5 mg by mouth daily.    Historical Provider, MD  aspirin 325 MG tablet Take 325 mg by mouth every other day.    Historical Provider, MD  calcium carbonate (OS-CAL) 600 MG TABS tablet Take 600 mg by mouth 2 (two) times daily with a meal.    Historical Provider, MD  cetirizine (ZYRTEC) 10 MG tablet Take 10 mg by mouth daily.    Historical Provider, MD  cholecalciferol (VITAMIN D) 1000 UNITS tablet Take 2,000 Units by mouth daily.  Historical Provider, MD  cyclobenzaprine (FLEXERIL) 5 MG tablet Take 5 mg by mouth daily as needed for muscle spasms.    Historical Provider, MD  diphenhydrAMINE (BENADRYL) 25 mg capsule Take 12.5-25 mg by mouth once.    Historical Provider, MD  HYDROcodone-acetaminophen (NORCO/VICODIN) 5-325 MG per tablet Take 1 tablet by mouth daily as needed for moderate pain. 12/19/13   Samantha J Rhyne, PA-C  lisinopril (PRINIVIL,ZESTRIL) 20 MG tablet Take 20 mg by mouth daily.     Historical Provider, MD  Melatonin 5 MG CAPS Take 1 capsule by mouth at bedtime.    Historical Provider, MD  metoprolol succinate (TOPROL-XL) 100 MG 24 hr tablet Take 100 mg by mouth daily. Take with or immediately following a meal.    Historical Provider, MD  Multiple Vitamins-Minerals (CENTRUM SILVER ADULT 50+ PO) Take 1 tablet by mouth daily.    Historical Provider, MD  ranitidine (ZANTAC) 150 MG tablet Take 150 mg by mouth 2 (two) times daily.    Historical Provider, MD  simvastatin (ZOCOR) 40 MG tablet Take 0.5 tablets (20 mg total) by mouth daily. 12/19/13   Samantha J Rhyne, PA-C  vitamin C (ASCORBIC ACID) 500 MG tablet Take 500 mg by mouth daily.    Historical Provider, MD   BP 150/70  Pulse 97  Temp(Src) 98.7 F (37.1 C) (Oral)  Resp 18  Ht 4\' 11"  (1.499 m)  Wt 173 lb (78.472 kg)  BMI 34.92 kg/m2  SpO2 100% Physical Exam 1815: Physical examination:  Nursing notes reviewed; Vital signs and O2 SAT reviewed;  Constitutional: Well developed, Well nourished, Well hydrated, In no acute distress; Head:  Normocephalic, atraumatic; Eyes: EOMI, PERRL, No scleral icterus; ENMT: Mouth and pharynx normal, Mucous membranes moist; Neck: Supple, Full range of motion, No lymphadenopathy; Cardiovascular: Regular rate and rhythm, No gallop; Respiratory: Breath sounds clear & equal bilaterally, No wheezes. Speaking full sentences with ease, Normal respiratory effort/excursion; Chest: Nontender, Movement normal; Abdomen: Soft, Nontender, Nondistended, Normal bowel sounds. Rectal exam performed w/permission of pt and ED RN chaperone present.  Anal tone normal.  Non-tender, soft brown stool ball in rectal vault.  No fissures, no external hemorrhoids, no palp masses.;; Genitourinary: No CVA tenderness; Extremities: Pulses normal, +right thigh fading ecchymosis, no open wounds, no erythema. No calf edema or asymmetry.; Neuro: AA&Ox3, Major CN grossly intact.  Speech clear. No gross focal motor or sensory deficits  in extremities. Climbs on and off stretcher easily by herself. Gait steady.; Skin: Color normal, Warm, Dry.   ED Course  Procedures   0102:  Pt disimpacted of large stool ball. Pt requesting to sit on commode to try to have further BM. Pt also c/o mild nausea after procedure, will dose zofran.   2020:  Pt refused zofran, stating she "felt better" after sitting on the commode. Pt passed a small amount of stool on commode. States she "still feels like I have to go" and is requesting an enema. Will dose.    2100:  Enema given with resultant small BM and passage of a large amount of flatus.  Pt states she "feels better" and wants to go home now. Dx and testing d/w pt and family.  Questions answered.  Verb understanding, agreeable to d/c home with outpt f/u.     MDM  MDM Reviewed: previous chart, nursing note and vitals Interpretation: x-ray    Dg Abd Acute W/chest 12/20/2013   CLINICAL DATA:  Weakness and constipation.  EXAM: ACUTE ABDOMEN SERIES (ABDOMEN 2 VIEW &  CHEST 1 VIEW)  COMPARISON:  12/16/2013  FINDINGS: There is no evidence of dilated small bowel loops or free intraperitoneal air. Air-filled loops of large bowel with fluid levels are identified. Prior cholecystectomy. No radiopaque calculi or other significant radiographic abnormality is seen. Heart size and mediastinal contours are within normal limits. Both lungs are clear.  IMPRESSION: 1. Non specific bowel gas pattern. No evidence for small bowel obstruction. 2. No active cardiopulmonary abnormalities.   Electronically Signed   By: Kerby Moors M.D.   On: 12/20/2013 19:29    Alfonzo Feller, DO 12/22/13 1630

## 2013-12-20 NOTE — ED Notes (Signed)
In with EDP for digital rectal exam. Pt. Seen by EDP prior to nurse assessment.

## 2014-01-11 ENCOUNTER — Encounter: Payer: Self-pay | Admitting: Vascular Surgery

## 2014-01-12 ENCOUNTER — Ambulatory Visit (INDEPENDENT_AMBULATORY_CARE_PROVIDER_SITE_OTHER): Payer: Self-pay | Admitting: Vascular Surgery

## 2014-01-12 ENCOUNTER — Encounter: Payer: Self-pay | Admitting: Vascular Surgery

## 2014-01-12 VITALS — BP 163/94 | HR 74 | Ht 59.0 in | Wt 174.0 lb

## 2014-01-12 DIAGNOSIS — I729 Aneurysm of unspecified site: Secondary | ICD-10-CM

## 2014-01-12 NOTE — Progress Notes (Signed)
[facility] [place] HISTORY AND PHYSICAL   CC:  F/u repair of right femoral pseudoaneurysm Rory Percy, MD  HPI: This is a 68 y.o. female who is s/p repair of right femoral pseudoaneurysm by Dr. Oneida Alar on 12/17/2013. She underwent cardiac catheterization and presented to the emergency department with a hematoma and pain in the right groin. A duplex ultrasound revealed a femoral pseudoaneurysm.   Today, she complains of pain around her right groin and swelling of her pubic area.  The pain has slightly improved since surgery but she has continued numbness around her right medial thigh. She denies any drainage of around her incision.   Past Medical History  Diagnosis Date  . Hypertension   . Asthma    Past Surgical History  Procedure Laterality Date  . Cholecystectomy    . Cardiac catheterization    . Breast surgery    . Thrombectomy femoral artery Right 12/17/2013    Procedure: REPAIR OF RIGHT FEMORAL ARTERY PSEUDOANEURYSM;  Surgeon: Elam Dutch, MD;  Location: [place];  Service: Vascular;  Laterality: Right;    No Known Allergies  Current Outpatient Prescriptions  Medication Sig Dispense Refill  . acetaminophen (TYLENOL) 500 MG tablet Take 500-1,000 mg by mouth 2 (two) times daily. 2 tablets in the morning and 1 tablet in the evening.      Marland Kitchen albuterol (PROVENTIL HFA;VENTOLIN HFA) 108 (90 BASE) MCG/ACT inhaler Inhale 2 puffs into the lungs every 6 (six) hours as needed for wheezing or shortness of breath.      Marland Kitchen amLODipine (NORVASC) 5 MG tablet Take 5 mg by mouth daily.      Marland Kitchen aspirin 325 MG tablet Take 325 mg by mouth every other day.      . calcium carbonate (OS-CAL) 600 MG TABS tablet Take 600 mg by mouth 2 (two) times daily with a meal.      . cetirizine (ZYRTEC) 10 MG tablet Take 10 mg by mouth daily.      . cholecalciferol (VITAMIN D) 1000 UNITS tablet Take 2,000 Units by mouth daily.      . cyclobenzaprine (FLEXERIL) 5 MG tablet Take 5 mg by mouth  daily as needed for muscle spasms.      Marland Kitchen HYDROcodone-acetaminophen (NORCO/VICODIN) 5-325 MG per tablet Take 1 tablet by mouth daily as needed for moderate pain.  30 tablet  0  . lisinopril (PRINIVIL,ZESTRIL) 20 MG tablet Take 20 mg by mouth daily.      . magnesium citrate SOLN Take 1 Bottle by mouth once.      . Melatonin 5 MG CAPS Take 1 capsule by mouth at bedtime.      . metoprolol succinate (TOPROL-XL) 100 MG 24 hr tablet Take 100 mg by mouth daily. Take with or immediately following a meal.      . Multiple Vitamins-Minerals (CENTRUM SILVER ADULT 50+ PO) Take 1 tablet by mouth daily.      . ranitidine (ZANTAC) 150 MG tablet Take 150 mg by mouth 2 (two) times daily.      . simvastatin (ZOCOR) 40 MG tablet Take 0.5 tablets (20 mg total) by mouth daily.  30 tablet  2  . sodium phosphate (FLEET) enema Place 1 enema rectally once as needed (for constipation). follow package directions      . vitamin C (ASCORBIC ACID) 500 MG tablet Take 500 mg by mouth daily.       No current facility-administered medications for this visit.    No family history  on file.  History   Social History  . Marital Status: Married    Spouse Name: N/A    Number of Children: N/A  . Years of Education: N/A   Occupational History  . Not on file.   Social History Main Topics  . Smoking status: Never Smoker   . Smokeless tobacco: Not on file  . Alcohol Use: No  . Drug Use: No  . Sexual Activity: Not on file   Other Topics Concern  . Not on file   Social History Narrative  . No narrative on file     ROS: [x]  Positive   [ ]  Negative   [ ]  All sytems reviewed and are negative  Cardiovascular: []  chest pain/pressure []  palpitations []  SOB lying flat []  DOE []  pain in legs while walking []  pain in feet when lying flat []  hx of DVT []  hx of phlebitis [x]  swelling in legs []  varicose veins  Pulmonary: []  productive cough []  asthma []  wheezing  Neurologic: []  weakness in []  arms []  legs [x]   numbness in []  arms [x]  legs (right) [] difficulty speaking or slurred speech []  temporary loss of vision in one eye []  dizziness  Hematologic: []  bleeding problems []  problems with blood clotting easily  GI []  vomiting blood []  blood in stool  GU: []  burning with urination []  blood in urine  Psychiatric: []  hx of major depression  Integumentary: []  rashes []  ulcers  Constitutional: []  fever []  chills   PHYSICAL EXAMINATION:  Filed Vitals:   01/12/14 1433  BP: 163/94  Pulse: 74   Body mass index is 35.13 kg/(m^2).  General:  WD obese female in NAD Gait: Not observed HENT: WNL, normocephalic Skin: without rashes, without ulcers, some ecchymosis around right groin incision. Groin incision is clean, dry, and intact. Palpable right inguinal lympadenopathy Vascular Exam/Pulses: palpable right DP and PT pulses Extremities:  without ischemic changes Musculoskeletal: no muscle wasting or atrophy  Neurologic: A&O X 3; Appropriate Affect ; paraesthesia of right medial thigh.  MOTOR FUNCTION:  moving all extremities equally   ASSESSMENT: 68 y.o. female s/p repair of right pseudoaneurym  PLAN: Right groin incision is healing well. Discussed that pain around groin will continue to improve. She has palpable right pedal pulses. Discussed that her medial thigh paresthesia may continue up to a year or indefinitely. Follow-up as needed. Patient requested work note. She will return to work in 3 weeks on 02/06/14.    Virgina Jock, PA-C [facility] [phone]  Clinic MD:  Pt seen and examined in conjunction with Dr. Oneida Alar.   History exam details as above. Patient doing well status post repair of femoral artery pseudoaneurysm. She still has some aches and pains and some numbness around the incision. Hopefully this will continue to resolve with time. Followup when necessary.  Ruta Hinds, MD [facility] [place] Office:  [phone] Pager: [phone]

## 2014-01-19 ENCOUNTER — Encounter: Payer: Medicare Other | Admitting: Vascular Surgery

## 2014-02-24 ENCOUNTER — Telehealth: Payer: Self-pay

## 2014-02-24 NOTE — Telephone Encounter (Signed)
Phone call from pt.  Reported she continues to have swelling in right foot that goes up above right calf, and numbness of inner aspect right knee.  Stated she was informed by Dr. Oneida Alar the numbness could take up to 1 yr. to resolve.  Denies any redness/inflammation or tenderness of the right lower extremity.  Stated the swelling does improve overnight, and at times completely resolves, and other times has small amt. of residual swelling in foot, when she awakens in the AM.  Stated she is not on her feel a lot during the day, and has been trying to elevate at intervals.  Reported she is receiving Physical Therapy on back and hip, and has appled for Short Term Disability, as she is unable to stand for long periods of time.  Questioned pt. on status of right groin; stated her right groin is healed-up.  Recommended that pt. wear compression stockings to try to improve the chronic swelling of the right lower extremity.  Stated she has worn them previously, and will start to wear them now.  Advised to call office for appt., if symptoms don't improve, or worsen.  Will make Dr. Oneida Alar aware.

## 2014-02-24 NOTE — Telephone Encounter (Signed)
Dr. Oneida Alar made aware of pt's symptoms.  No new orders/ recommendations at this time.

## 2014-05-22 DIAGNOSIS — K210 Gastro-esophageal reflux disease with esophagitis: Secondary | ICD-10-CM | POA: Diagnosis not present

## 2014-05-22 DIAGNOSIS — E780 Pure hypercholesterolemia: Secondary | ICD-10-CM | POA: Diagnosis not present

## 2014-05-22 DIAGNOSIS — Z1389 Encounter for screening for other disorder: Secondary | ICD-10-CM | POA: Diagnosis not present

## 2014-05-22 DIAGNOSIS — I10 Essential (primary) hypertension: Secondary | ICD-10-CM | POA: Diagnosis not present

## 2014-05-22 DIAGNOSIS — M545 Low back pain: Secondary | ICD-10-CM | POA: Diagnosis not present

## 2014-07-05 DIAGNOSIS — R05 Cough: Secondary | ICD-10-CM | POA: Diagnosis not present

## 2014-07-05 DIAGNOSIS — I10 Essential (primary) hypertension: Secondary | ICD-10-CM | POA: Diagnosis not present

## 2014-07-05 DIAGNOSIS — J209 Acute bronchitis, unspecified: Secondary | ICD-10-CM | POA: Diagnosis not present

## 2014-11-23 DIAGNOSIS — I10 Essential (primary) hypertension: Secondary | ICD-10-CM | POA: Diagnosis not present

## 2014-11-23 DIAGNOSIS — K210 Gastro-esophageal reflux disease with esophagitis: Secondary | ICD-10-CM | POA: Diagnosis not present

## 2014-11-23 DIAGNOSIS — M545 Low back pain: Secondary | ICD-10-CM | POA: Diagnosis not present

## 2015-05-25 DIAGNOSIS — Z23 Encounter for immunization: Secondary | ICD-10-CM | POA: Diagnosis not present

## 2015-07-04 DIAGNOSIS — R5382 Chronic fatigue, unspecified: Secondary | ICD-10-CM | POA: Diagnosis not present

## 2015-07-04 DIAGNOSIS — I10 Essential (primary) hypertension: Secondary | ICD-10-CM | POA: Diagnosis not present

## 2015-07-04 DIAGNOSIS — Z0000 Encounter for general adult medical examination without abnormal findings: Secondary | ICD-10-CM | POA: Diagnosis not present

## 2015-07-04 DIAGNOSIS — E7800 Pure hypercholesterolemia, unspecified: Secondary | ICD-10-CM | POA: Diagnosis not present

## 2015-07-04 DIAGNOSIS — K210 Gastro-esophageal reflux disease with esophagitis: Secondary | ICD-10-CM | POA: Diagnosis not present

## 2015-07-11 DIAGNOSIS — Z0000 Encounter for general adult medical examination without abnormal findings: Secondary | ICD-10-CM | POA: Diagnosis not present

## 2015-07-11 DIAGNOSIS — Z1389 Encounter for screening for other disorder: Secondary | ICD-10-CM | POA: Diagnosis not present

## 2015-07-26 DIAGNOSIS — Z1211 Encounter for screening for malignant neoplasm of colon: Secondary | ICD-10-CM | POA: Diagnosis not present

## 2015-07-26 NOTE — H&P (Signed)
  NTS SOAP Note  Vital Signs:  Vitals as of: [id number]: Systolic A999333: Diastolic 93: Heart Rate 65: Temp 97.42F (Temporal): Height 61ft 11in: Weight 164Lbs 0 Ounces: BMI 33.12   BMI : 33.12 kg/m2  Subjective: This 70 year old female presents for of need for screening TCS.  Last had a TCS over ten years ago.  No family h/o colon cancer.  Denies any lower gi complaints.  Review of Symptoms:  Constitutional:unremarkable   Head:unremarkable Eyes:unremarkable   Nose/Mouth/Throat:unremarkable Cardiovascular:  unremarkable Respiratory:unremarkable Gastrointestinal:  unremarkable   Genitourinary:unremarkable   back pain Skin:unremarkable Hematolgic/Lymphatic:unremarkable   Allergic/Immunologic:unremarkable   Past Medical History:  Reviewed  Past Medical History  Surgical History: lap cholecystectomy, pseudoanuerysm repair, right groin Medical Problems: HTN, high choelsterol Allergies: nkda Medications: lisinopril, metoprolol, amlodipine, simvastatin, zantac   Social History:Reviewed  Social History  Preferred Language: English Race:  White Ethnicity: Not Hispanic / Latino Age: 70 year Marital Status:  M Alcohol: no   Smoking Status: Never smoker reviewed on 07/26/2015 Functional Status reviewed on 07/26/2015 ------------------------------------------------ Bathing: Normal Cooking: Normal Dressing: Normal Driving: Normal Eating: Normal Managing Meds: Normal Oral Care: Normal Shopping: Normal Toileting: Normal Transferring: Normal Walking: Normal Cognitive Status reviewed on 07/26/2015 ------------------------------------------------ Attention: Normal Decision Making: Normal Language: Normal Memory: Normal Motor: Normal Perception: Normal Problem Solving: Normal Visual and Spatial: Normal   Family History:Reviewed  Family Health History Mother, Deceased; Heart disease;  Father     Objective Information: General:Well appearing,  well nourished in no distress. Heart:RRR, no murmur Lungs:  CTA bilaterally, no wheezes, rhonchi, rales.  Breathing unlabored. Abdomen:Soft, NT/ND, no HSM, no masses. deferred to procedure  Assessment:Need for screening TCS  Diagnoses: V76.51  Z12.11 Screening for malignant neoplasm of colon (Encounter for screening for malignant neoplasm of colon)  Procedures: [id number] - OFFICE OUTPATIENT NEW 20 MINUTES    Plan:  Scheduled for screening TCS on 08/14/15.   Patient Education:Alternative treatments to surgery were discussed with patient (and family).  Risks and benefits  of procedure including bleeding and perforation were fully explained to the patient (and family) who gave informed consent. Patient/family questions were addressed.  Follow-up:Pending Surgery

## 2015-08-14 ENCOUNTER — Ambulatory Visit (HOSPITAL_COMMUNITY)
Admission: RE | Admit: 2015-08-14 | Discharge: 2015-08-14 | Disposition: A | Discharge status: Home / Self Care | Payer: Medicare Other | Source: Ambulatory Visit | Attending: General Surgery | Admitting: General Surgery

## 2015-08-14 ENCOUNTER — Encounter (HOSPITAL_COMMUNITY): Payer: Self-pay | Admitting: *Deleted

## 2015-08-14 ENCOUNTER — Encounter (HOSPITAL_COMMUNITY)
Admission: RE | Disposition: A | Discharge status: Home / Self Care | Payer: Self-pay | Source: Ambulatory Visit | Attending: General Surgery

## 2015-08-14 DIAGNOSIS — K5730 Diverticulosis of large intestine without perforation or abscess without bleeding: Secondary | ICD-10-CM | POA: Diagnosis not present

## 2015-08-14 DIAGNOSIS — I10 Essential (primary) hypertension: Secondary | ICD-10-CM | POA: Insufficient documentation

## 2015-08-14 DIAGNOSIS — Z1211 Encounter for screening for malignant neoplasm of colon: Secondary | ICD-10-CM | POA: Diagnosis not present

## 2015-08-14 DIAGNOSIS — Z7982 Long term (current) use of aspirin: Secondary | ICD-10-CM | POA: Insufficient documentation

## 2015-08-14 DIAGNOSIS — Z791 Long term (current) use of non-steroidal anti-inflammatories (NSAID): Secondary | ICD-10-CM | POA: Insufficient documentation

## 2015-08-14 DIAGNOSIS — E7800 Pure hypercholesterolemia, unspecified: Secondary | ICD-10-CM | POA: Diagnosis not present

## 2015-08-14 DIAGNOSIS — Z79899 Other long term (current) drug therapy: Secondary | ICD-10-CM | POA: Insufficient documentation

## 2015-08-14 HISTORY — DX: Pure hypercholesterolemia, unspecified: E78.00

## 2015-08-14 HISTORY — DX: Allergy status to unspecified drugs, medicaments and biological substances: Z88.9

## 2015-08-14 HISTORY — PX: COLONOSCOPY: SHX5424

## 2015-08-14 SURGERY — COLONOSCOPY
Anesthesia: Moderate Sedation

## 2015-08-14 MED ORDER — MEPERIDINE HCL 50 MG/ML IJ SOLN
INTRAMUSCULAR | Status: AC
  Filled 2015-08-14: qty 1

## 2015-08-14 MED ORDER — MIDAZOLAM HCL 5 MG/5ML IJ SOLN
INTRAMUSCULAR | Status: AC
  Filled 2015-08-14: qty 5

## 2015-08-14 MED ORDER — SODIUM CHLORIDE 0.9 % IV SOLN
INTRAVENOUS | Status: DC
  Administered 2015-08-14: 08:00:00 via INTRAVENOUS

## 2015-08-14 MED ORDER — MEPERIDINE HCL 50 MG/ML IJ SOLN
INTRAMUSCULAR | Status: DC | PRN
  Administered 2015-08-14: 50 mg via INTRAVENOUS

## 2015-08-14 MED ORDER — MIDAZOLAM HCL 5 MG/5ML IJ SOLN
INTRAMUSCULAR | Status: DC | PRN
  Administered 2015-08-14: 1 mg via INTRAVENOUS
  Administered 2015-08-14: 3 mg via INTRAVENOUS

## 2015-08-14 MED ORDER — STERILE WATER FOR IRRIGATION IR SOLN
Status: DC | PRN
  Administered 2015-08-14: 08:00:00

## 2015-08-14 NOTE — Discharge Instructions (Signed)
Colonoscopy, Care After °Refer to this sheet in the next few weeks. These instructions provide you with information on caring for yourself after your procedure. Your health care provider may also give you more specific instructions. Your treatment has been planned according to current medical practices, but problems sometimes occur. Call your health care provider if you have any problems or questions after your procedure. °WHAT TO EXPECT AFTER THE PROCEDURE  °After your procedure, it is typical to have the following: °· A small amount of blood in your stool. °· Moderate amounts of gas and mild abdominal cramping or bloating. °HOME CARE INSTRUCTIONS °· Do not drive, operate machinery, or sign important documents for 24 hours. °· You may shower and resume your regular physical activities, but move at a slower pace for the first 24 hours. °· Take frequent rest periods for the first 24 hours. °· Walk around or put a warm pack on your abdomen to help reduce abdominal cramping and bloating. °· Drink enough fluids to keep your urine clear or pale yellow. °· You may resume your normal diet as instructed by your health care provider. Avoid heavy or fried foods that are hard to digest. °· Avoid drinking alcohol for 24 hours or as instructed by your health care provider. °· Only take over-the-counter or prescription medicines as directed by your health care provider. °· If a tissue sample (biopsy) was taken during your procedure: °¨ Do not take aspirin or blood thinners for 7 days, or as instructed by your health care provider. °¨ Do not drink alcohol for 7 days, or as instructed by your health care provider. °¨ Eat soft foods for the first 24 hours. °SEEK MEDICAL CARE IF: °You have persistent spotting of blood in your stool 2-3 days after the procedure. °SEEK IMMEDIATE MEDICAL CARE IF: °· You have more than a small spotting of blood in your stool. °· You pass large blood clots in your stool. °· Your abdomen is swollen  (distended). °· You have nausea or vomiting. °· You have a fever. °· You have increasing abdominal pain that is not relieved with medicine. °  °This information is not intended to replace advice given to you by your health care provider. Make sure you discuss any questions you have with your health care provider. °  °Document Released: 01/22/2004 Document Revised: 03/30/2013 Document Reviewed: 02/14/2013 °Elsevier Interactive Patient Education ©2016 Elsevier Inc[employer] °Diverticulosis °Diverticulosis is the condition that develops when small pouches (diverticula) form in the wall of your colon. Your colon, or large intestine, is where water is absorbed and stool is formed. The pouches form when the inside layer of your colon pushes through weak spots in the outer layers of your colon. °CAUSES  °No one knows exactly what causes diverticulosis. °RISK FACTORS °· Being older than 50. Your risk for this condition increases with age. Diverticulosis is rare in people younger than 40 years. By age 80, almost everyone has it. °· Eating a low-fiber diet. °· Being frequently constipated. °· Being overweight. °· Not getting enough exercise. °· Smoking. °· Taking over-the-counter pain medicines, like aspirin and ibuprofen. °SYMPTOMS  °Most people with diverticulosis do not have symptoms. °DIAGNOSIS  °Because diverticulosis often has no symptoms, health care providers often discover the condition during an exam for other colon problems. In many cases, a health care provider will diagnose diverticulosis while using a flexible scope to examine the colon (colonoscopy). °TREATMENT  °If you have never developed an infection related to diverticulosis, you may not need   treatment. If you have had an infection before, treatment may include: °· Eating more fruits, vegetables, and grains. °· Taking a fiber supplement. °· Taking a live bacteria supplement (probiotic). °· Taking medicine to relax your colon. °HOME CARE INSTRUCTIONS  °· Drink at  least 6-8 glasses of water each day to prevent constipation. °· Try not to strain when you have a bowel movement. °· Keep all follow-up appointments. °If you have had an infection before:  °· Increase the fiber in your diet as directed by your health care provider or dietitian. °· Take a dietary fiber supplement if your health care provider approves. °· Only take medicines as directed by your health care provider. °SEEK MEDICAL CARE IF:  °· You have abdominal pain. °· You have bloating. °· You have cramps. °· You have not gone to the bathroom in 3 days. °SEEK IMMEDIATE MEDICAL CARE IF:  °· Your pain gets worse. °· Your bloating becomes very bad. °· You have a fever or chills, and your symptoms suddenly get worse. °· You begin vomiting. °· You have bowel movements that are bloody or black. °MAKE SURE YOU: °· Understand these instructions. °· Will watch your condition. °· Will get help right away if you are not doing well or get worse. °  °This information is not intended to replace advice given to you by your health care provider. Make sure you discuss any questions you have with your health care provider. °  °Document Released: 03/06/2004 Document Revised: 06/14/2013 Document Reviewed: 05/04/2013 °Elsevier Interactive Patient Education ©2016 Elsevier Inc. ° °

## 2015-08-14 NOTE — Op Note (Signed)
[facility] [address] [place], [address]   COLONOSCOPY PROCEDURE REPORT     EXAM DATE: September 11, 2015  PATIENT NAME:      April Guerra, April Guerra           MR #:      [id number] BIRTHDATE:       04-Oct-1945      VISIT #:     [id number]  ATTENDING:     Aviva Signs, MD     STATUS:     outpatient ASSISTANT:  INDICATIONS:  The patient is a 70 yr old female here for a colonoscopy due to average risk patient for colon cancer. PROCEDURE PERFORMED:     Colonoscopy, screening MEDICATIONS:     Demerol 50 mg IV and Versed 4 mg IV ESTIMATED BLOOD LOSS:     None  CONSENT: The patient understands the risks and benefits of the procedure and understands that these risks include, but are not limited to: sedation, allergic reaction, infection, perforation and/or bleeding. Alternative means of evaluation and treatment include, among others: physical exam, x-rays, and/or surgical intervention. The patient elects to proceed with this endoscopic procedure.  DESCRIPTION OF PROCEDURE: During intra-op preparation period all mechanical & medical equipment was checked for proper function. Hand hygiene and appropriate measures for infection prevention was taken. After the risks, benefits and alternatives of the procedure were thoroughly explained, Informed consent was verified, confirmed and timeout was successfully executed by the treatment team. A digital exam revealed several skin tags. The [id number] endoscope was introduced through the anus and advanced to the cecum, which was identified by both the appendix and ileocecal valve. adequate (Trilyte was used) The instrument was then slowly withdrawn as the colon was fully examined.Estimated blood loss is zero unless otherwise noted in this procedure report.   COLON FINDINGS: There was moderate diverticulosis noted in the sigmoid colon.   The examination was otherwise normal. Retroflexed views revealed internal hemorrhoids.  The scope was then completely withdrawn from the patient and the procedure terminated. SCOPE WITHDRAWAL TIME: 6    ADVERSE EVENTS:      There were no immediate complications.  IMPRESSIONS:     1.  Moderate diverticulosis was noted in the sigmoid colon 2.  The examination was otherwise normal  RECOMMENDATIONS:     Repeat Colonscopy in 10 years. RECALL:  _____________________________ Aviva Signs, MD eSigned:  Aviva Signs, MD 09/11/2015 8:36 AM   cc:   CPT CODES: ICD CODES:  The ICD and CPT codes recommended by this software are interpretations from the data that the clinical staff has captured with the software.  The verification of the translation of this report to the ICD and CPT codes and modifiers is the sole responsibility of the health care institution and practicing physician where this report was generated.  [employer] will not be held responsible for the validity of the ICD and CPT codes included on this report.  AMA assumes no liability for data contained or not contained herein. CPT is a Designer, television/film set of the [employer].

## 2015-08-14 NOTE — Interval H&P Note (Signed)
History and Physical Interval Note:  08/14/2015 8:16 AM  April Guerra  has presented today for surgery, with the diagnosis of screening  The various methods of treatment have been discussed with the patient and family. After consideration of risks, benefits and other options for treatment, the patient has consented to  Procedure(s): COLONOSCOPY (N/A) as a surgical intervention .  The patient's history has been reviewed, patient examined, no change in status, stable for surgery.  I have reviewed the patient's chart and labs.  Questions were answered to the patient's satisfaction.     Aviva Signs A

## 2015-08-16 ENCOUNTER — Encounter (HOSPITAL_COMMUNITY): Payer: Self-pay | Admitting: General Surgery

## 2015-10-04 DIAGNOSIS — H1013 Acute atopic conjunctivitis, bilateral: Secondary | ICD-10-CM | POA: Diagnosis not present

## 2015-10-04 DIAGNOSIS — Z1231 Encounter for screening mammogram for malignant neoplasm of breast: Secondary | ICD-10-CM | POA: Diagnosis not present

## 2016-01-10 DIAGNOSIS — I10 Essential (primary) hypertension: Secondary | ICD-10-CM | POA: Diagnosis not present

## 2016-01-10 DIAGNOSIS — E7800 Pure hypercholesterolemia, unspecified: Secondary | ICD-10-CM | POA: Diagnosis not present

## 2016-01-30 DIAGNOSIS — M79672 Pain in left foot: Secondary | ICD-10-CM | POA: Diagnosis not present

## 2016-02-27 DIAGNOSIS — H2513 Age-related nuclear cataract, bilateral: Secondary | ICD-10-CM | POA: Diagnosis not present

## 2016-02-27 DIAGNOSIS — H43391 Other vitreous opacities, right eye: Secondary | ICD-10-CM | POA: Diagnosis not present

## 2016-04-17 DIAGNOSIS — Z23 Encounter for immunization: Secondary | ICD-10-CM | POA: Diagnosis not present

## 2016-07-21 DIAGNOSIS — K210 Gastro-esophageal reflux disease with esophagitis: Secondary | ICD-10-CM | POA: Diagnosis not present

## 2016-07-21 DIAGNOSIS — E7800 Pure hypercholesterolemia, unspecified: Secondary | ICD-10-CM | POA: Diagnosis not present

## 2016-07-21 DIAGNOSIS — R5382 Chronic fatigue, unspecified: Secondary | ICD-10-CM | POA: Diagnosis not present

## 2016-07-21 DIAGNOSIS — I10 Essential (primary) hypertension: Secondary | ICD-10-CM | POA: Diagnosis not present

## 2016-07-24 DIAGNOSIS — Z0000 Encounter for general adult medical examination without abnormal findings: Secondary | ICD-10-CM | POA: Diagnosis not present

## 2016-08-05 DIAGNOSIS — H16141 Punctate keratitis, right eye: Secondary | ICD-10-CM | POA: Diagnosis not present

## 2016-08-20 DIAGNOSIS — H16141 Punctate keratitis, right eye: Secondary | ICD-10-CM | POA: Diagnosis not present

## 2016-08-30 DIAGNOSIS — R0789 Other chest pain: Secondary | ICD-10-CM | POA: Diagnosis not present

## 2016-08-30 DIAGNOSIS — R05 Cough: Secondary | ICD-10-CM | POA: Diagnosis not present

## 2016-08-30 DIAGNOSIS — J209 Acute bronchitis, unspecified: Secondary | ICD-10-CM | POA: Diagnosis not present

## 2016-09-03 DIAGNOSIS — H1851 Endothelial corneal dystrophy: Secondary | ICD-10-CM | POA: Diagnosis not present

## 2016-09-05 DIAGNOSIS — R079 Chest pain, unspecified: Secondary | ICD-10-CM | POA: Diagnosis not present

## 2016-09-05 DIAGNOSIS — R931 Abnormal findings on diagnostic imaging of heart and coronary circulation: Secondary | ICD-10-CM | POA: Diagnosis not present

## 2016-09-05 DIAGNOSIS — R0789 Other chest pain: Secondary | ICD-10-CM | POA: Diagnosis not present

## 2017-03-31 DIAGNOSIS — R499 Unspecified voice and resonance disorder: Secondary | ICD-10-CM | POA: Diagnosis not present

## 2017-03-31 DIAGNOSIS — R05 Cough: Secondary | ICD-10-CM | POA: Diagnosis not present

## 2017-04-16 DIAGNOSIS — Z23 Encounter for immunization: Secondary | ICD-10-CM | POA: Diagnosis not present

## 2017-06-18 DIAGNOSIS — H524 Presbyopia: Secondary | ICD-10-CM | POA: Diagnosis not present

## 2017-06-18 DIAGNOSIS — H25813 Combined forms of age-related cataract, bilateral: Secondary | ICD-10-CM | POA: Diagnosis not present

## 2017-06-18 DIAGNOSIS — H35031 Hypertensive retinopathy, right eye: Secondary | ICD-10-CM | POA: Diagnosis not present

## 2017-06-30 DIAGNOSIS — M7742 Metatarsalgia, left foot: Secondary | ICD-10-CM | POA: Diagnosis not present

## 2017-07-02 NOTE — Progress Notes (Deleted)
[facility] Note  07/06/2017     CHIEF COMPLAINT Patient presents for No chief complaint on file.   HISTORY OF PRESENT ILLNESS: April Guerra is a 72 y.o. female who presents to the clinic today for:     Referring physician: Rory Percy, MD [place], [place] [address]  HISTORICAL INFORMATION:   Selected notes from the MEDICAL RECORD NUMBER Referred by Dr. Leander Rams for concern of atypical nevus OD, ret heme vs telangectasia OS;  LEE- 12.27.18 (R. Davis) [BCVA OD: 20/20 OS: 20/20] Ocular Hx- DES, punctate keratitis OD, Fuchs dyst OU, CTL wear PMH- HTN, elevated cholesterol    CURRENT MEDICATIONS: No current outpatient medications on file. (Ophthalmic Drugs)   No current facility-administered medications for this visit.  (Ophthalmic Drugs)   Current Outpatient Medications (Other)  Medication Sig   acetaminophen (TYLENOL) 500 MG tablet Take 500 mg by mouth at bedtime.    amLODipine (NORVASC) 10 MG tablet Take 10 mg by mouth daily.   aspirin EC 81 MG tablet Take 81 mg by mouth daily.   calcium carbonate (OS-CAL) 600 MG TABS tablet Take 600 mg by mouth daily with breakfast.    cetirizine (ZYRTEC) 10 MG tablet Take 10 mg by mouth daily.   cholecalciferol (VITAMIN D) 1000 UNITS tablet Take 1,000 Units by mouth daily.    Coenzyme Q10 (COQ10) 100 MG CAPS Take 1 capsule by mouth daily.   cyclobenzaprine (FLEXERIL) 5 MG tablet Take 5 mg by mouth daily as needed for muscle spasms.   HYDROcodone-acetaminophen (NORCO/VICODIN) 5-325 MG per tablet Take 1 tablet by mouth daily as needed for moderate pain.   lisinopril (PRINIVIL,ZESTRIL) 20 MG tablet Take 20 mg by mouth daily.   Melatonin 5 MG CAPS Take 1 capsule by mouth at bedtime.   metoprolol succinate (TOPROL-XL) 100 MG 24 hr tablet Take 100 mg by mouth daily. Take with or immediately following a meal.   Multiple Vitamins-Minerals (CENTRUM SILVER ADULT 50+ PO) Take 1 tablet by mouth  daily.   naproxen sodium (ANAPROX) 220 MG tablet Take 440 mg by mouth daily.   ranitidine (ZANTAC) 150 MG tablet Take 150 mg by mouth daily as needed for heartburn.    simvastatin (ZOCOR) 40 MG tablet Take 0.5 tablets (20 mg total) by mouth daily. (Patient taking differently: Take 40 mg by mouth daily. )   vitamin C (ASCORBIC ACID) 500 MG tablet Take 500 mg by mouth daily.   No current facility-administered medications for this visit.  (Other)      REVIEW OF SYSTEMS:    ALLERGIES No Known Allergies  PAST MEDICAL HISTORY Past Medical History:  Diagnosis Date   Asthma    H/O seasonal allergies    Hypercholesteremia    Hypertension    Past Surgical History:  Procedure Laterality Date   BREAST SURGERY     CARDIAC CATHETERIZATION     CHOLECYSTECTOMY     COLONOSCOPY N/A 08/14/2015   Procedure: COLONOSCOPY;  Surgeon: Aviva Signs, MD;  [facility];  Service: Gastroenterology;  Laterality: N/A;   THROMBECTOMY FEMORAL ARTERY Right 12/17/2013   Procedure: REPAIR OF RIGHT FEMORAL ARTERY PSEUDOANEURYSM;  Surgeon: Elam Dutch, MD;  Location: [facility];  Service: Vascular;  Laterality: Right;    FAMILY HISTORY No family history on file.  SOCIAL HISTORY Social History   Tobacco Use   Smoking status: Never Smoker  Substance Use Topics   Alcohol use: No   Drug  No  ° °  ° °  ° °OPHTHALMIC EXAM: ° Not recorded  °  ° ° °IMAGING AND PROCEDURES  °Imaging and Procedures for 07/02/17 ° ° ° °  °  ° °  °ASSESSMENT/PLAN: ° °  ICD-10-CM   °1. Retinal edema H35.81 OCT, Retina - OU - Both Eyes  ° ° °1. ° °2. ° °3. ° °Ophthalmic Meds Ordered this visit:  °No orders of the defined types were placed in this encounter. ° ° °  ° °No Follow-up on file. ° °There are no Patient Instructions on file for this visit. ° ° °Explained the diagnoses, plan, and follow up with the patient and they expressed understanding.  Patient expressed understanding of the importance of proper  follow up care.  ° °This document serves as a record of services personally performed by Brian G. Zamora, MD, PhD. It was created on their behalf by Meredith Fabian, COA, a certified ophthalmic assistant. The creation of this record is the provider's dictation and/or activities during the visit. ° °Electronically signed by: Meredith Fabian, COA  07/02/17 2:19 PM ° ° ° °Brian G. Zamora, M.D., Ph.D. °Diseases & Surgery of the Retina and Vitreous °[facility] °07/02/17 ° ° ° ° °Abbreviations: °M myopia (nearsighted); A astigmatism; H hyperopia (farsighted); P presbyopia; Mrx spectacle prescription;  CTL contact lenses; OD right eye; OS left eye; OU both eyes  XT exotropia; ET esotropia; PEK punctate epithelial keratitis; PEE punctate epithelial erosions; DES dry eye syndrome; MGD meibomian gland dysfunction; ATs artificial tears; PFAT's preservative free artificial tears; NSC nuclear sclerotic cataract; PSC posterior subcapsular cataract; ERM epi-retinal membrane; PVD posterior vitreous detachment; RD retinal detachment; DM diabetes mellitus; DR diabetic retinopathy; NPDR non-proliferative diabetic retinopathy; PDR proliferative diabetic retinopathy; CSME clinically significant macular edema; DME diabetic macular edema; dbh dot blot hemorrhages; CWS cotton wool spot; POAG primary open angle glaucoma; C/D cup-to-disc ratio; HVF humphrey visual field; GVF goldmann visual field; OCT optical coherence tomography; IOP intraocular pressure; BRVO Branch retinal vein occlusion; CRVO central retinal vein occlusion; CRAO central retinal artery occlusion; BRAO branch retinal artery occlusion; RT retinal tear; SB scleral buckle; PPV pars plana vitrectomy; VH Vitreous hemorrhage; PRP panretinal laser photocoagulation; IVK intravitreal kenalog; VMT vitreomacular traction; MH Macular hole;  NVD neovascularization of the disc; NVE neovascularization elsewhere; AREDS age related eye disease study; ARMD age  related macular degeneration; POAG primary open angle glaucoma; EBMD epithelial/anterior basement membrane dystrophy; ACIOL anterior chamber intraocular lens; IOL intraocular lens; PCIOL posterior chamber intraocular lens; Phaco/IOL phacoemulsification with intraocular lens placement; PRK photorefractive keratectomy; LASIK laser assisted in situ keratomileusis; HTN hypertension; DM diabetes mellitus; COPD chronic obstructive pulmonary disease °

## 2017-07-04 DIAGNOSIS — J0101 Acute recurrent maxillary sinusitis: Secondary | ICD-10-CM | POA: Diagnosis not present

## 2017-07-06 ENCOUNTER — Encounter (INDEPENDENT_AMBULATORY_CARE_PROVIDER_SITE_OTHER): Payer: Self-pay | Admitting: Ophthalmology

## 2017-07-09 ENCOUNTER — Encounter (INDEPENDENT_AMBULATORY_CARE_PROVIDER_SITE_OTHER): Payer: Self-pay | Admitting: Ophthalmology

## 2017-07-09 NOTE — Progress Notes (Signed)
[facility] Note  07/10/2017     CHIEF COMPLAINT Patient presents for Retina Evaluation   HISTORY OF PRESENT ILLNESS: April Guerra is a 72 y.o. female who presents to the clinic today for:   HPI    Retina Evaluation    In both eyes.  This started 2 years ago.  Duration of 24 hours.  Associated Symptoms Floaters.  Negative for Flashes, Pain, Trauma, Fever, Weight Loss, Scalp Tenderness, Redness, Distortion, Photophobia, Jaw Claudication, Fatigue, Blind Spot, Glare and Shoulder/Hip pain.  Context:  distance vision, mid-range vision and near vision.  Treatments tried include no treatments.  I, the attending physician,  performed the HPI with the patient and updated documentation appropriately.          Comments    Referral of Dr. Rosana Hoes for retina evaluation. Patient states for the" last couple years she has had floaters in her right eye" She reports her vision in her left eye has always been good. Denies flashes distortion and ocular pain. Pt reports she wears a contact in right only. Use visine allergy gtts PRN / take multivitamin  QD.       Last edited by Bernarda Caffey, MD on 07/10/2017 10:26 AM. (History)    Pt states Dr. Rosana Hoes referred her for a freckle in OD, pt reports she was seen on a routine basis; Pt states she has red OU due to allergies, reports using visine allergy gtts;   Referring physician: Leticia Clas, DO [address] [place]. [place], [place] [address]  HISTORICAL INFORMATION:   Selected notes from the MEDICAL RECORD NUMBER Referred by Dr. Leander Rams for concern of atypical nevus OD, ret heme vs telangectasia OS;  LEE- 12.27.18 (R. Davis) [BCVA OD: 20/20 OS: 20/20] Ocular Hx- DES, punctate keratitis OD, Fuchs dyst OU, CTL wear PMH- HTN, elevated cholesterol    CURRENT MEDICATIONS: No current outpatient medications on file. (Ophthalmic Drugs)   No current facility-administered medications for this visit.  (Ophthalmic Drugs)    Current Outpatient Medications (Other)  Medication Sig  . acetaminophen (TYLENOL) 500 MG tablet Take 500 mg by mouth at bedtime.   Marland Kitchen amLODipine (NORVASC) 10 MG tablet Take 10 mg by mouth daily.  Marland Kitchen aspirin EC 81 MG tablet Take 81 mg by mouth daily.  . calcium carbonate (OS-CAL) 600 MG TABS tablet Take 600 mg by mouth daily with breakfast.   . cetirizine (ZYRTEC) 10 MG tablet Take 10 mg by mouth daily.  . cholecalciferol (VITAMIN D) 1000 UNITS tablet Take 1,000 Units by mouth daily.   . Coenzyme Q10 (COQ10) 100 MG CAPS Take 1 capsule by mouth daily.  . cyclobenzaprine (FLEXERIL) 5 MG tablet Take 5 mg by mouth daily as needed for muscle spasms.  Marland Kitchen HYDROcodone-acetaminophen (NORCO/VICODIN) 5-325 MG per tablet Take 1 tablet by mouth daily as needed for moderate pain.  Marland Kitchen lisinopril (PRINIVIL,ZESTRIL) 20 MG tablet Take 20 mg by mouth daily.  . Melatonin 5 MG CAPS Take 1 capsule by mouth at bedtime.  . metoprolol succinate (TOPROL-XL) 100 MG 24 hr tablet Take 100 mg by mouth daily. Take with or immediately following a meal.  . Multiple Vitamins-Minerals (CENTRUM SILVER ADULT 50+ PO) Take 1 tablet by mouth daily.  . naproxen sodium (ANAPROX) 220 MG tablet Take 440 mg by mouth daily.  . ranitidine (ZANTAC) 150 MG tablet Take 150 mg by mouth daily as needed for heartburn.   . simvastatin (ZOCOR) 40 MG tablet Take 0.5 tablets (  20 mg total) by mouth daily. (Patient taking differently: Take 40 mg by mouth daily. )  . vitamin C (ASCORBIC ACID) 500 MG tablet Take 500 mg by mouth daily.   No current facility-administered medications for this visit.  (Other)      REVIEW OF SYSTEMS: ROS    Positive for: Eyes   Negative for: Constitutional, Gastrointestinal, Neurological, Skin, Genitourinary, Musculoskeletal, HENT, Endocrine, Cardiovascular, Respiratory, Psychiatric, Allergic/Imm, Heme/Lymph   Last edited by Zenovia Jordan, LPN on 4/65/6812  7:51 AM. (History)       ALLERGIES No Known  Allergies  PAST MEDICAL HISTORY Past Medical History:  Diagnosis Date  . Asthma   . H/O seasonal allergies   . Hypercholesteremia   . Hypertension    Past Surgical History:  Procedure Laterality Date  . BREAST SURGERY    . CARDIAC CATHETERIZATION    . CHOLECYSTECTOMY    . COLONOSCOPY N/A 08/14/2015   Procedure: COLONOSCOPY;  Surgeon: Aviva Signs, MD;  [facility];  Service: Gastroenterology;  Laterality: N/A;  . THROMBECTOMY FEMORAL ARTERY Right 12/17/2013   Procedure: REPAIR OF RIGHT FEMORAL ARTERY PSEUDOANEURYSM;  Surgeon: Elam Dutch, MD;  Location: [facility] OR;  Service: Vascular;  Laterality: Right;    FAMILY HISTORY Family History  Problem Relation Age of Onset  . Hypertension Mother   . Heart failure Mother     SOCIAL HISTORY Social History   Tobacco Use  . Smoking status: Never Smoker  . Smokeless tobacco: Never Used  Substance Use Topics  . Alcohol use: No  . Drug use: No         OPHTHALMIC EXAM:  Base Eye Exam    Visual Acuity (Snellen - Linear)      Right Left   Dist cc 20/30 20/25 -1   Dist ph cc 20/25 +2 20/25 -1   Correction:  Glasses       Tonometry (Tonopen, 9:52 AM)      Right Left   Pressure 21 19       Pupils      Dark Light Shape React APD   Right 5 4 Round Brisk None   Left 5 4 Round Brisk None       Visual Fields (Counting fingers)      Left Right    Full Full       Extraocular Movement      Right Left    Full, Ortho Full, Ortho       Neuro/Psych    Oriented x3:  Yes   Mood/Affect:  Normal       Dilation    Both eyes:  1.0% Mydriacyl, 2.5% Phenylephrine @ 9:52 AM        Slit Lamp and Fundus Exam    Slit Lamp Exam      Right Left   Lids/Lashes Dermatochalasis - upper lid Dermatochalasis - upper lid   Conjunctiva/Sclera White and quiet White and quiet   Cornea Mild Arcus, Debris in tear film Mild Arcus, Debris in tear film   Anterior Chamber Deep and quiet Deep and quiet   Iris Round and dilated,  persistant pupillary membrane nasally Round and dilated, persistant pupillary membrane nasally   Lens 2+ Nuclear sclerosis, 1+ Cortical cataract 2+ Nuclear sclerosis, 1+ Cortical cataract   Vitreous Vitreous syneresis, Posterior vitreous detachment Vitreous syneresis       Fundus Exam      Right Left   Disc compact mild nasal elevation   C/D Ratio 0.2 0.2  Macula Mild Retinal pigment epithelial mottling, No heme or edema Good foveal reflex, mild Retinal pigment epithelial mottling   Vessels mildy Tortuous, mild AV crossing changes mildy Tortuous, mild AV crossing changes   Periphery Attached, Pigmented cystoid degeneration ST ora Attached        Refraction    Wearing Rx      Sphere   Right -2.25   Left -2.50   Age:  56yr  Type:  SVL          IMAGING AND PROCEDURES  Imaging and Procedures for 07/10/17  OCT, Retina - OU - Both Eyes     Right Eye Quality was good. Central Foveal Thickness: 288. Progression has no prior data. Findings include normal foveal contour, no IRF, no SRF.   Left Eye Quality was good. Central Foveal Thickness: 287. Progression has no prior data. Findings include normal foveal contour, no IRF, no SRF (PVD).   Notes *Images captured and stored on drive  Diagnosis / Impression:  NFP, No IRF/SRF OU  Clinical management:  See below  Abbreviations: NFP - Normal foveal profile. CME - cystoid macular edema. PED - pigment epithelial detachment. IRF - intraretinal fluid. SRF - subretinal fluid. EZ - ellipsoid zone. ERM - epiretinal membrane. ORA - outer retinal atrophy. ORT - outer retinal tubulation. SRHM - subretinal hyper-reflective material                  ASSESSMENT/PLAN:    ICD-10-CM   1. 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 Retinal edema H35.81 OCT, Retina - OU - Both Eyes  3. Posterior vitreous detachment of both eyes H43.813   4. Combined forms of age-related cataract of both eyes H25.813      1. Pigmentary cystoid degeneration, peripheral, OD - pigmented cystoid degeneration in superotemporal quadrant - no elevation, RT or RD - monitor  2,3. Hypertensive retinopathy OU - discussed importance of tight BP control - monitor  4.No retinal edema on exam or OCT  5. PVD / vitreous syneresis OU  asymptomatic  Discussed findings and prognosis  No RT or RD on 360 scleral depressed exam  Reviewed s/s of RT/RD  Strict return precautions for any such RT/RD signs/symptoms   F/U PRN  6. Combined forms of age related cataract OU-  - The symptoms of cataract, surgical options, and treatments and risks were discussed with patient. - discussed diagnosis and progression - not yet visually significant - monitor for now   Ophthalmic Meds Ordered this visit:  No orders of the defined types were placed in this encounter.      Return if symptoms worsen or fail to improve.  There are no Patient Instructions on file for this visit.   Explained the diagnoses, plan, and follow up with the patient and they expressed understanding.  Patient expressed understanding of the importance of proper follow up care.   This document serves as a record of services personally performed by BGardiner Sleeper MD, PhD. It was created on their behalf by MCatha Brow CGoshen a certified ophthalmic assistant. The creation of this record is the provider's dictation and/or activities during the visit.  Electronically signed by: MCatha Brow COA  07/10/17 11:03 PM    BGardiner Sleeper M.D., Ph.D. Diseases & Surgery of the Retina and [place]01/18/19  I have reviewed the above documentation for accuracy and completeness, and I agree with the above. BGardiner Sleeper M.D., Ph.D. 07/10/17  11:59 PM     Abbreviations: M myopia (nearsighted); A astigmatism; H hyperopia (farsighted); P presbyopia; Mrx spectacle prescription;  CTL contact lenses; OD right eye; OS  left eye; OU both eyes  XT exotropia; ET esotropia; PEK punctate epithelial keratitis; PEE punctate epithelial erosions; DES dry eye syndrome; MGD meibomian gland dysfunction; ATs artificial tears; PFAT's preservative free artificial tears; [place] nuclear sclerotic cataract; PSC posterior subcapsular cataract; ERM epi-retinal membrane; PVD posterior vitreous detachment; RD retinal detachment; DM diabetes mellitus; DR diabetic retinopathy; NPDR non-proliferative diabetic retinopathy; PDR proliferative diabetic retinopathy; CSME clinically significant macular edema; DME diabetic macular edema; dbh dot blot hemorrhages; CWS cotton wool spot; POAG primary open angle glaucoma; C/D cup-to-disc ratio; HVF humphrey visual field; GVF goldmann visual field; OCT optical coherence tomography; IOP intraocular pressure; BRVO Branch retinal vein occlusion; CRVO central retinal vein occlusion; CRAO central retinal artery occlusion; BRAO branch retinal artery occlusion; RT retinal tear; SB scleral buckle; PPV pars plana vitrectomy; VH Vitreous hemorrhage; PRP panretinal laser photocoagulation; IVK intravitreal kenalog; VMT vitreomacular traction; MH Macular hole;  NVD neovascularization of the disc; NVE neovascularization elsewhere; AREDS age related eye disease study; ARMD age related macular degeneration; POAG primary open angle glaucoma; EBMD epithelial/anterior basement membrane dystrophy; ACIOL anterior chamber intraocular lens; IOL intraocular lens; PCIOL posterior chamber intraocular lens; Phaco/IOL phacoemulsification with intraocular lens placement; [place] photorefractive keratectomy; LASIK laser assisted in situ keratomileusis; HTN hypertension; DM diabetes mellitus; COPD chronic obstructive pulmonary disease

## 2017-07-10 ENCOUNTER — Encounter (INDEPENDENT_AMBULATORY_CARE_PROVIDER_SITE_OTHER): Payer: Self-pay | Admitting: Ophthalmology

## 2017-07-10 ENCOUNTER — Ambulatory Visit (INDEPENDENT_AMBULATORY_CARE_PROVIDER_SITE_OTHER): Payer: Medicare Other | Admitting: Ophthalmology

## 2017-07-10 DIAGNOSIS — H43813 Vitreous degeneration, bilateral: Secondary | ICD-10-CM | POA: Diagnosis not present

## 2017-07-10 DIAGNOSIS — I10 Essential (primary) hypertension: Secondary | ICD-10-CM

## 2017-07-10 DIAGNOSIS — H35451 Secondary pigmentary degeneration, right eye: Secondary | ICD-10-CM | POA: Diagnosis not present

## 2017-07-10 DIAGNOSIS — H3581 Retinal edema: Secondary | ICD-10-CM

## 2017-07-10 DIAGNOSIS — H35033 Hypertensive retinopathy, bilateral: Secondary | ICD-10-CM

## 2017-07-10 DIAGNOSIS — H25813 Combined forms of age-related cataract, bilateral: Secondary | ICD-10-CM | POA: Diagnosis not present

## 2017-08-04 DIAGNOSIS — R499 Unspecified voice and resonance disorder: Secondary | ICD-10-CM | POA: Diagnosis not present

## 2017-08-04 DIAGNOSIS — R05 Cough: Secondary | ICD-10-CM | POA: Diagnosis not present

## 2017-08-07 DIAGNOSIS — R5382 Chronic fatigue, unspecified: Secondary | ICD-10-CM | POA: Diagnosis not present

## 2017-08-07 DIAGNOSIS — R0789 Other chest pain: Secondary | ICD-10-CM | POA: Diagnosis not present

## 2017-08-07 DIAGNOSIS — K210 Gastro-esophageal reflux disease with esophagitis: Secondary | ICD-10-CM | POA: Diagnosis not present

## 2017-08-07 DIAGNOSIS — E7800 Pure hypercholesterolemia, unspecified: Secondary | ICD-10-CM | POA: Diagnosis not present

## 2017-08-07 DIAGNOSIS — I10 Essential (primary) hypertension: Secondary | ICD-10-CM | POA: Diagnosis not present

## 2017-08-11 DIAGNOSIS — Z0000 Encounter for general adult medical examination without abnormal findings: Secondary | ICD-10-CM | POA: Diagnosis not present

## 2017-09-29 DIAGNOSIS — Z1231 Encounter for screening mammogram for malignant neoplasm of breast: Secondary | ICD-10-CM | POA: Diagnosis not present

## 2017-10-01 DIAGNOSIS — H9201 Otalgia, right ear: Secondary | ICD-10-CM | POA: Diagnosis not present

## 2017-12-31 DIAGNOSIS — M7742 Metatarsalgia, left foot: Secondary | ICD-10-CM | POA: Diagnosis not present

## 2018-01-04 DIAGNOSIS — H1033 Unspecified acute conjunctivitis, bilateral: Secondary | ICD-10-CM | POA: Diagnosis not present

## 2018-01-04 DIAGNOSIS — H11153 Pinguecula, bilateral: Secondary | ICD-10-CM | POA: Diagnosis not present

## 2018-01-05 DIAGNOSIS — M19072 Primary osteoarthritis, left ankle and foot: Secondary | ICD-10-CM | POA: Diagnosis not present

## 2018-01-11 DIAGNOSIS — H1033 Unspecified acute conjunctivitis, bilateral: Secondary | ICD-10-CM | POA: Diagnosis not present

## 2018-01-11 DIAGNOSIS — H11153 Pinguecula, bilateral: Secondary | ICD-10-CM | POA: Diagnosis not present

## 2018-01-15 DIAGNOSIS — M7742 Metatarsalgia, left foot: Secondary | ICD-10-CM | POA: Diagnosis not present

## 2018-01-15 DIAGNOSIS — M79672 Pain in left foot: Secondary | ICD-10-CM | POA: Diagnosis not present

## 2018-02-03 DIAGNOSIS — M79632 Pain in left forearm: Secondary | ICD-10-CM | POA: Diagnosis not present

## 2018-02-05 DIAGNOSIS — M7742 Metatarsalgia, left foot: Secondary | ICD-10-CM | POA: Diagnosis not present

## 2018-02-05 DIAGNOSIS — M79672 Pain in left foot: Secondary | ICD-10-CM | POA: Diagnosis not present

## 2018-02-05 DIAGNOSIS — M2042 Other hammer toe(s) (acquired), left foot: Secondary | ICD-10-CM | POA: Diagnosis not present

## 2018-03-19 DIAGNOSIS — M2041 Other hammer toe(s) (acquired), right foot: Secondary | ICD-10-CM | POA: Diagnosis not present

## 2018-03-19 DIAGNOSIS — M7741 Metatarsalgia, right foot: Secondary | ICD-10-CM | POA: Diagnosis not present

## 2018-03-19 DIAGNOSIS — M21619 Bunion of unspecified foot: Secondary | ICD-10-CM | POA: Diagnosis not present

## 2018-05-01 ENCOUNTER — Emergency Department (HOSPITAL_COMMUNITY)
Admission: EM | Admit: 2018-05-01 | Discharge: 2018-05-01 | Disposition: A | Discharge status: Home / Self Care | Payer: Medicare Other | Attending: Emergency Medicine | Admitting: Emergency Medicine

## 2018-05-01 ENCOUNTER — Other Ambulatory Visit: Payer: Self-pay

## 2018-05-01 ENCOUNTER — Encounter (HOSPITAL_COMMUNITY): Payer: Self-pay

## 2018-05-01 DIAGNOSIS — J45909 Unspecified asthma, uncomplicated: Secondary | ICD-10-CM | POA: Insufficient documentation

## 2018-05-01 DIAGNOSIS — I10 Essential (primary) hypertension: Secondary | ICD-10-CM | POA: Diagnosis not present

## 2018-05-01 DIAGNOSIS — Z79899 Other long term (current) drug therapy: Secondary | ICD-10-CM | POA: Diagnosis not present

## 2018-05-01 DIAGNOSIS — I4891 Unspecified atrial fibrillation: Secondary | ICD-10-CM | POA: Diagnosis not present

## 2018-05-01 DIAGNOSIS — R002 Palpitations: Secondary | ICD-10-CM | POA: Diagnosis present

## 2018-05-01 DIAGNOSIS — Z7982 Long term (current) use of aspirin: Secondary | ICD-10-CM | POA: Insufficient documentation

## 2018-05-01 LAB — BASIC METABOLIC PANEL
Anion gap: 9 (ref 5–15)
BUN: 15 mg/dL (ref 8–23)
CHLORIDE: 105 mmol/L (ref 98–111)
CO2: 25 mmol/L (ref 22–32)
CREATININE: 0.58 mg/dL (ref 0.44–1.00)
Calcium: 9.7 mg/dL (ref 8.9–10.3)
GFR calc Af Amer: >60 mL/min (ref >60)
Glucose, Bld: 99 mg/dL (ref 70–99)
Potassium: 3.6 mmol/L (ref 3.5–5.1)
Sodium: 139 mmol/L (ref 135–145)

## 2018-05-01 LAB — MAGNESIUM: MAGNESIUM: 2.2 mg/dL (ref 1.7–2.4)

## 2018-05-01 LAB — CBC
HCT: 41.3 % (ref 36.0–46.0)
HEMOGLOBIN: 13.6 g/dL (ref 12.0–15.0)
MCH: 31.6 pg (ref 26.0–34.0)
MCHC: 32.9 g/dL (ref 30.0–36.0)
MCV: 96 fL (ref 80.0–100.0)
Platelets: 341 10*3/uL (ref 150–400)
RBC: 4.3 MIL/uL (ref 3.87–5.11)
RDW: 12.7 % (ref 11.5–15.5)
WBC: 10.5 10*3/uL (ref 4.0–10.5)
nRBC: 0 % (ref 0.0–0.2)

## 2018-05-01 MED ORDER — PROPOFOL 10 MG/ML IV BOLUS
INTRAVENOUS | Status: AC | PRN
  Administered 2018-05-01: 40 mg via INTRAVENOUS

## 2018-05-01 MED ORDER — PROPOFOL 10 MG/ML IV BOLUS
40.0000 mg | Freq: Once | INTRAVENOUS | Status: AC
Start: 2018-05-01 — End: 2018-05-01
  Administered 2018-05-01: 40 mg via INTRAVENOUS
  Filled 2018-05-01: qty 20

## 2018-05-01 MED ORDER — APIXABAN 5 MG PO TABS: 5.0000 mg | ORAL_TABLET | Freq: Two times a day (BID) | ORAL | 0 refills | Status: DC

## 2018-05-01 MED ORDER — APIXABAN 5 MG PO TABS
5.0000 mg | ORAL_TABLET | Freq: Two times a day (BID) | ORAL | Status: DC
  Administered 2018-05-01: 5 mg via ORAL
  Filled 2018-05-01: qty 1

## 2018-05-01 NOTE — ED Provider Notes (Signed)
[facility] EMERGENCY DEPARTMENT Provider Note   CSN: [id number] Arrival date & time: 05/01/18  0038     History   Chief Complaint Chief Complaint  Patient presents with  . Tachycardia    HPI April Guerra is a 72 y.o. female.  The history is provided by the patient.  Palpitations   This is a new problem. The current episode started 3 to 5 hours ago. The problem occurs constantly. The problem has not changed since onset.The problem is associated with an unknown factor. Pertinent negatives include no diaphoresis, no fever, no chest pain, no syncope, no vomiting and no shortness of breath. Treatments tried: metoprolol. The treatment provided no relief.  PT presents with palpitations.  She reports onset about 3 hours ago.  No chest pain/shortness of breath.  No syncope.  She reports similar episode several years ago, but was never diagnosed atrial fibrillation.  Tonight she took an extra dose of metoprolol when her heart was racing. Past Medical History:  Diagnosis Date  . Asthma   . H/O seasonal allergies   . Hypercholesteremia   . Hypertension     Patient Active Problem List   Diagnosis Date Noted  . Pseudoaneurysm ([place]) 01/12/2014  . 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 BREAST SURGERY    . CARDIAC CATHETERIZATION    . CHOLECYSTECTOMY    . COLONOSCOPY N/A 08/14/2015   Procedure: COLONOSCOPY;  Surgeon: Aviva Signs, MD;  [facility];  Service: Gastroenterology;  Laterality: N/A;  . THROMBECTOMY FEMORAL ARTERY Right 12/17/2013   Procedure: REPAIR OF RIGHT FEMORAL ARTERY PSEUDOANEURYSM;  Surgeon: Elam Dutch, MD;  Location: [facility];  Service: Vascular;  Laterality: Right;     OB History   None      Home Medications    Prior to Admission medications   Medication Sig Start Date End Date Taking? Authorizing Provider  acetaminophen (TYLENOL) 325 MG tablet Take 325 mg by mouth every 6  (six) hours as needed.   Yes [provider]  amLODipine (NORVASC) 10 MG tablet Take 10 mg by mouth daily.   Yes [provider]  aspirin EC 81 MG tablet Take 81 mg by mouth daily.   Yes [provider]  calcium carbonate (OS-CAL) 600 MG TABS tablet Take 600 mg by mouth daily with breakfast.    Yes [provider]  cetirizine (ZYRTEC) 10 MG tablet Take 10 mg by mouth daily.   Yes [provider]  cholecalciferol (VITAMIN D) 1000 UNITS tablet Take 1,000 Units by mouth daily.    Yes [provider]  cyclobenzaprine (FLEXERIL) 5 MG tablet Take 5 mg by mouth daily as needed for muscle spasms.   Yes [provider]  lisinopril (PRINIVIL,ZESTRIL) 20 MG tablet Take 20 mg by mouth daily.   Yes [provider]  Melatonin 5 MG CAPS Take 1 capsule by mouth at bedtime.   Yes [provider]  metoprolol succinate (TOPROL-XL) 100 MG 24 hr tablet Take 100 mg by mouth daily. Take with or immediately following a meal.   Yes [provider]  Multiple Vitamins-Minerals (CENTRUM SILVER ADULT 50+ PO) Take 1 tablet by mouth daily.   Yes [provider]  ranitidine (ZANTAC) 150 MG tablet Take 150 mg by mouth daily as needed for heartburn.    Yes [provider]  simvastatin (ZOCOR) 40 MG tablet Take 0.5 tablets (20 mg total) by mouth  daily. Patient taking differently: Take 40 mg by mouth daily.  12/19/13  Yes Rhyne, Samantha J, PA-C  vitamin C (ASCORBIC ACID) 500 MG tablet Take 500 mg by mouth daily.   Yes [provider]  acetaminophen (TYLENOL) 500 MG tablet Take 500 mg by mouth at bedtime.     [provider]  Coenzyme Q10 (COQ10) 100 MG CAPS Take 1 capsule by mouth daily.    [provider]  HYDROcodone-acetaminophen (NORCO/VICODIN) 5-325 MG per tablet Take 1 tablet by mouth daily as needed for moderate pain. 12/19/13   Rhyne, Hulen Shouts, PA-C  naproxen sodium (ANAPROX) 220 MG tablet  Take 440 mg by mouth daily.    [provider]    Family History Family History  Problem Relation Age of Onset  . Hypertension Mother   . Heart failure Mother     Social History Social History   Tobacco Use  . Smoking status: Never Smoker  . Smokeless tobacco: Never Used  Substance Use Topics  . Alcohol use: No  . Drug use: No     Allergies   Patient has no known allergies.   Review of Systems Review of Systems  Constitutional: Negative for diaphoresis and fever.  Respiratory: Negative for shortness of breath.   Cardiovascular: Positive for palpitations. Negative for chest pain and syncope.  Gastrointestinal: Negative for vomiting.  Neurological: Negative for syncope.  Hematological: Does not bruise/bleed easily.  All other systems reviewed and are negative.    Physical Exam Updated Vital Signs BP (!) 140/110 (BP Location: Left Arm)   Pulse 63   Temp 97.7 F (36.5 C) (Oral)   Resp 18   Ht 1.499 m (4\' 11" )   Wt 72.6 kg   SpO2 99%   BMI 32.32 kg/m   Physical Exam  CONSTITUTIONAL: Well developed/well nourished HEAD: Normocephalic/atraumatic EYES: EOMI/PERRL ENMT: Mucous membranes moist NECK: supple no meningeal signs SPINE/BACK:entire spine nontender CV: tachycardic/irregular LUNGS: Lungs are clear to auscultation bilaterally, no apparent distress ABDOMEN: soft, nontender, no rebound or guarding, bowel sounds noted throughout abdomen GU:no cva tenderness NEURO: Pt is awake/alert/appropriate, moves all extremitiesx4.  No facial droop.   EXTREMITIES: pulses normal/equal, full ROM SKIN: warm, color normal PSYCH: no abnormalities of mood noted, alert and oriented to situation  ED Treatments / Results  Labs (all labs ordered are listed, but only abnormal results are displayed) Labs Reviewed  BASIC METABOLIC PANEL  MAGNESIUM  CBC    EKG EKG Interpretation  Date/Time:  Saturday May 01 2018 00:50:56 EST Ventricular Rate:  151 PR  Interval:    QRS Duration: 84 QT Interval:  266 QTC Calculation: 422 R Axis:   20 Text Interpretation:  Atrial fibrillation with rapid V-rate Probable LVH with secondary repol abnrm changed from prior Confirmed by Ripley Fraise [phone]) on 05/01/2018 12:55:08 AM   EKG Interpretation  Date/Time:  Saturday May 01 2018 03:20:05 EST Ventricular Rate:  79 PR Interval:    QRS Duration: 90 QT Interval:  351 QTC Calculation: 403 R Axis:   24 Text Interpretation:  Sinus rhythm Anterior infarct, old Minimal ST depression, anterolateral leads afib has resolved Confirmed by Ripley Fraise [phone]) on 05/01/2018 3:28:50 AM       Radiology No results found.  Procedures .Sedation Date/Time: 05/01/2018 3:14 AM Performed by: Ripley Fraise, MD Authorized by: Ripley Fraise, MD   Consent:    Consent obtained:  Written   Consent given by:  Patient   Risks discussed:  Respiratory compromise necessitating ventilatory assistance  and intubation   Alternatives discussed:  Analgesia without sedation Universal protocol:    Immediately prior to procedure a time out was called: yes     Patient identity confirmation method:  Arm band, provided demographic data and verbally with patient Indications:    Procedure performed:  Cardioversion   Procedure necessitating sedation performed by:  Physician performing sedation Pre-sedation assessment:    Time since last food or drink:  5   ASA classification: class 2 - patient with mild systemic disease     Neck mobility: normal     Mallampati score:  I - soft palate, uvula, fauces, pillars visible   Pre-sedation assessments completed and reviewed: airway patency     Pre-sedation assessment completed:  05/01/2018 3:13 AM Immediate pre-procedure details:    Reassessment: Patient reassessed immediately prior to procedure     Reviewed: vital signs and NPO status     Verified: bag valve mask available, emergency equipment available and oxygen available    Procedure details (see MAR for exact dosages):    Preoxygenation:  Nasal cannula   Sedation:  Propofol   Intra-procedure monitoring:  Blood pressure monitoring, cardiac monitor, continuous pulse oximetry, frequent LOC assessments and frequent vital sign checks   Intra-procedure events: none     Total Provider sedation time (minutes):  17 Post-procedure details:    Post-sedation assessment completed:  05/01/2018 3:30 AM   Attendance: Constant attendance by certified staff until patient recovered     Recovery: Patient returned to pre-procedure baseline     Post-sedation assessments completed and reviewed: airway patency, mental status and respiratory function     Patient is stable for discharge or admission: yes     Patient tolerance:  Tolerated well, no immediate complications .Cardioversion Date/Time: 05/01/2018 3:15 AM Performed by: Ripley Fraise, MD Authorized by: Ripley Fraise, MD   Consent:    Consent obtained:  Written   Consent given by:  Patient   Alternatives discussed:  No treatment and rate-control medication Pre-procedure details:    Cardioversion basis:  Emergent   Rhythm:  Atrial fibrillation   Electrode placement:  Anterior-posterior Patient sedated: Yes. Refer to sedation procedure documentation for details of sedation.  Attempt one:    Cardioversion mode:  Synchronous   Shock (Joules):  100   Shock outcome:  Conversion to normal sinus rhythm Post-procedure details:    Patient status:  Awake   Patient tolerance of procedure:  Tolerated well, no immediate complications      Medications Ordered in ED Medications  apixaban (ELIQUIS) tablet 5 mg (5 mg Oral Given 05/01/18 0349)  propofol (DIPRIVAN) 10 mg/mL bolus/IV push (40 mg Intravenous Given 05/01/18 0318)  propofol (DIPRIVAN) 10 mg/mL bolus/IV push 40 mg (40 mg Intravenous Given 05/01/18 0318)     Initial Impression / Assessment and Plan / ED Course  I have reviewed the triage vital signs and the  nursing notes.  Pertinent labs & imaging results that were available during my care of the patient were reviewed by me and considered in my medical decision making (see chart for details).     1:16 AM This patients CHA2DS2-VASc Score and unadjusted Ischemic Stroke Rate (% per year) is equal to 3.2 % stroke rate/year from a score of 3  Above score calculated as 1 point each if present [CHF, HTN, DM, Vascular=MI/PAD/Aortic Plaque, Age if 65-74, or Female] Above score calculated as 2 points each if present [Age > 75, or Stroke/TIA/TE]  Presents with new onset atrial fibrillation in the  past 3 hours.  I discussed the risk and benefits of cardioversion.  She is considering this at this time. 3:29 AM Tolerated sedation and cardioversion well.  She is now awake and alert.  No focal weakness noted.  Heart rate is in the 70s to 80s and a sinus rhythm 4:24 AM Patient is at baseline, taking p.o. fluids and in no acute distress. BP 113/77   Pulse 67   Temp 97.7 F (36.5 C) (Oral)   Resp 18   Ht 1.499 m (4\' 11" )   Wt 72.6 kg   SpO2 98%   BMI 32.32 kg/m  She has been given Eliquis, and will start this at home.  She has been referred to atrial fibrillation clinic.  I feel she is safe and appropriate for discharge home.  Final Clinical Impressions(s) / ED Diagnoses   Final diagnoses:  Atrial fibrillation with RVR [facility])    ED Discharge Orders         Ordered    Amb referral to AFIB Clinic     05/01/18 0054    apixaban (ELIQUIS) 5 MG TABS tablet  2 times daily     05/01/18 2119           Ripley Fraise, MD 05/01/18 0425

## 2018-05-01 NOTE — ED Notes (Signed)
ED Provider at bedside. 

## 2018-05-01 NOTE — ED Triage Notes (Signed)
Pt arrived from home via POV c/o rapid heart rate. Pt reports it began apprx 2200 last night. Pt reports this occurred once before 2-3 years ago. Pt states she took a whole Metoprolol at home apprx 25mins ago prior to ED arrival.

## 2018-05-04 ENCOUNTER — Ambulatory Visit (HOSPITAL_COMMUNITY)
Admission: RE | Admit: 2018-05-04 | Discharge: 2018-05-04 | Disposition: A | Discharge status: Home / Self Care | Payer: Medicare Other | Source: Ambulatory Visit | Attending: Nurse Practitioner | Admitting: Nurse Practitioner

## 2018-05-04 ENCOUNTER — Encounter (HOSPITAL_COMMUNITY): Payer: Self-pay | Admitting: Nurse Practitioner

## 2018-05-04 VITALS — BP 126/74 | HR 66 | Ht 59.0 in | Wt 163.0 lb

## 2018-05-04 DIAGNOSIS — I119 Hypertensive heart disease without heart failure: Secondary | ICD-10-CM | POA: Diagnosis not present

## 2018-05-04 DIAGNOSIS — I480 Paroxysmal atrial fibrillation: Secondary | ICD-10-CM | POA: Diagnosis not present

## 2018-05-04 DIAGNOSIS — Z7901 Long term (current) use of anticoagulants: Secondary | ICD-10-CM | POA: Insufficient documentation

## 2018-05-04 DIAGNOSIS — Z79899 Other long term (current) drug therapy: Secondary | ICD-10-CM | POA: Diagnosis not present

## 2018-05-04 DIAGNOSIS — I4891 Unspecified atrial fibrillation: Secondary | ICD-10-CM | POA: Diagnosis present

## 2018-05-04 DIAGNOSIS — E7800 Pure hypercholesterolemia, unspecified: Secondary | ICD-10-CM | POA: Diagnosis not present

## 2018-05-04 DIAGNOSIS — Z9049 Acquired absence of other specified parts of digestive tract: Secondary | ICD-10-CM | POA: Diagnosis not present

## 2018-05-04 DIAGNOSIS — J45909 Unspecified asthma, uncomplicated: Secondary | ICD-10-CM | POA: Insufficient documentation

## 2018-05-04 DIAGNOSIS — Z8249 Family history of ischemic heart disease and other diseases of the circulatory system: Secondary | ICD-10-CM | POA: Diagnosis not present

## 2018-05-04 MED ORDER — DILTIAZEM HCL 30 MG PO TABS: ORAL_TABLET | ORAL | 3 refills | Status: DC

## 2018-05-04 NOTE — Progress Notes (Signed)
Primary Care Physician: Rory Percy, MD Referring Physician: Forestine Na ER f/u    April Guerra is a 72 y.o. female with a h/o HTN, high cholesterol that had new onset of afib, apparently came on without a clear trigger. She was successfully cardioverted. She was placed on eliquis. She does not drink alcohol, does not smoke, minimal caffeine. She snores but her husband does not believe that she has apnea. She is sedentary.   Today, she denies symptoms of palpitations, chest pain, shortness of breath, orthopnea, PND, lower extremity edema, dizziness, presyncope, syncope, or neurologic sequela. The patient is tolerating medications without difficulties and is otherwise without complaint today.   Past Medical History:  Diagnosis Date  . Asthma   . H/O seasonal allergies   . Hypercholesteremia   . Hypertension    Past Surgical History:  Procedure Laterality Date  . BREAST SURGERY    . CARDIAC CATHETERIZATION    . CHOLECYSTECTOMY    . COLONOSCOPY N/A 08/14/2015   Procedure: COLONOSCOPY;  Surgeon: Aviva Signs, MD;  [facility];  Service: Gastroenterology;  Laterality: N/A;  . THROMBECTOMY FEMORAL ARTERY Right 12/17/2013   Procedure: REPAIR OF RIGHT FEMORAL ARTERY PSEUDOANEURYSM;  Surgeon: Elam Dutch, MD;  Location: [place];  Service: Vascular;  Laterality: Right;    Current Outpatient Medications  Medication Sig Dispense Refill  . acetaminophen (TYLENOL) 325 MG tablet Take 325 mg by mouth every 6 (six) hours as needed.    Marland Kitchen amLODipine (NORVASC) 10 MG tablet Take 10 mg by mouth daily.    Marland Kitchen apixaban (ELIQUIS) 5 MG TABS tablet Take 1 tablet (5 mg total) by mouth 2 (two) times daily. 60 tablet 0  . calcium carbonate (OS-CAL) 600 MG TABS tablet Take 600 mg by mouth 2 (two) times daily.     . cetirizine (ZYRTEC) 10 MG tablet Take 10 mg by mouth daily.    . cholecalciferol (VITAMIN D) 1000 UNITS tablet Take 1,000 Units by mouth daily.     . Coenzyme Q10 (COQ10) 100 MG CAPS  Take 1 capsule by mouth daily.    . cyclobenzaprine (FLEXERIL) 5 MG tablet Take 5 mg by mouth daily as needed for muscle spasms.    Marland Kitchen lisinopril (PRINIVIL,ZESTRIL) 20 MG tablet Take 20 mg by mouth daily.    . Magnesium 100 MG TABS Take by mouth.    . Melatonin 5 MG CAPS Take 1 capsule by mouth at bedtime.    . metoprolol succinate (TOPROL-XL) 100 MG 24 hr tablet Take 100 mg by mouth daily. Take with or immediately following a meal.    . Multiple Vitamins-Minerals (CENTRUM SILVER ADULT 50+ PO) Take 1 tablet by mouth daily.    . ranitidine (ZANTAC) 150 MG tablet Take 150 mg by mouth daily as needed for heartburn.     . simvastatin (ZOCOR) 40 MG tablet Take 0.5 tablets (20 mg total) by mouth daily. (Patient taking differently: Take 40 mg by mouth daily. ) 30 tablet 2  . vitamin C (ASCORBIC ACID) 500 MG tablet Take 500 mg by mouth daily.    Marland Kitchen diltiazem (CARDIZEM) 30 MG tablet Take 1 tablet every 4 hours AS NEEDED for AFIB heart rate >100 30 tablet 3   No current facility-administered medications for this encounter.     No Known Allergies  Social History   Socioeconomic History  . Marital status: Married    Spouse name: Not on file  . Number of children: Not on file  .  Years of education: Not on file  . Highest education level: Not on file  Occupational History  . Not on file  Social Needs  . Financial resource strain: Not on file  . Food insecurity:    Worry: Not on file    Inability: Not on file  . Transportation needs:    Medical: Not on file    Non-medical: Not on file  Tobacco Use  . Smoking status: Never Smoker  . Smokeless tobacco: Never Used  Substance and Sexual Activity  . Alcohol use: No  . Drug use: No  . Sexual activity: Not Currently  Lifestyle  . Physical activity:    Days per week: Not on file    Minutes per session: Not on file  . Stress: Not on file  Relationships  . Social connections:    Talks on phone: Not on file    Gets together: Not on file     Attends religious service: Not on file    Active member of club or organization: Not on file    Attends meetings of clubs or organizations: Not on file    Relationship status: Not on file  . Intimate partner violence:    Fear of current or ex partner: Not on file    Emotionally abused: Not on file    Physically abused: Not on file    Forced sexual activity: Not on file  Other Topics Concern  . Not on file  Social History Narrative  . Not on file    Family History  Problem Relation Age of Onset  . Hypertension Mother   . Heart failure Mother     ROS- All systems are reviewed and negative except as per the HPI above  Physical Exam: Vitals:   05/04/18 1543  BP: 126/74  Pulse: 66  Weight: 73.9 kg  Height: 4\' 11"  (1.499 m)   Wt Readings from Last 3 Encounters:  05/04/18 73.9 kg  05/01/18 72.6 kg  08/14/15 73.5 kg    Labs: Lab Results  Component Value Date   NA 139 05/01/2018   K 3.6 05/01/2018   CL 105 05/01/2018   CO2 25 05/01/2018   GLUCOSE 99 05/01/2018   BUN 15 05/01/2018   CREATININE 0.58 05/01/2018   CALCIUM 9.7 05/01/2018   MG 2.2 05/01/2018   No results found for: INR No results found for: CHOL, HDL, LDLCALC, TRIG   GEN- The patient is well appearing, alert and oriented x 3 today.   Head- normocephalic, atraumatic Eyes-  Sclera clear, conjunctiva pink Ears- hearing intact Oropharynx- clear Neck- supple, no JVP Lymph- no cervical lymphadenopathy Lungs- Clear to ausculation bilaterally, normal work of breathing Heart- Regular rate and rhythm, no murmurs, rubs or gallops, PMI not laterally displaced GI- soft, NT, ND, + BS Extremities- no clubbing, cyanosis, or edema MS- no significant deformity or atrophy Skin- no rash or lesion Psych- euthymic mood, full affect Neuro- strength and sensation are intact  EKG-NSR at 66 bpm, PR int 172 ms, qrs int 82 ms, qtc 415 ms    Assessment and Plan: 1. New onset paroxysmal afib  General education of  afib Triggers discussed  Echo ordered  30 mg cardizem to be used as pill in pocket if episodes of afib return Encouraged to develop  walking routine  2. CHA2DS2VASc score of 3 Continue eliquis 5 mg bid Bleeding precautions discussed  Advised not to take NSAIDS when on anticoagulation   3. HTN  Stable  F/u in 3  weeks  Geroge Baseman. Mercedes Fort, [facility] [address] [place], [place] [address] [phone]

## 2018-05-06 ENCOUNTER — Ambulatory Visit (HOSPITAL_COMMUNITY)
Admission: RE | Admit: 2018-05-06 | Discharge: 2018-05-06 | Disposition: A | Discharge status: Home / Self Care | Payer: Medicare Other | Source: Ambulatory Visit | Attending: Nurse Practitioner | Admitting: Nurse Practitioner

## 2018-05-06 DIAGNOSIS — I480 Paroxysmal atrial fibrillation: Secondary | ICD-10-CM | POA: Diagnosis not present

## 2018-05-06 DIAGNOSIS — I119 Hypertensive heart disease without heart failure: Secondary | ICD-10-CM | POA: Diagnosis not present

## 2018-05-06 DIAGNOSIS — I340 Nonrheumatic mitral (valve) insufficiency: Secondary | ICD-10-CM | POA: Diagnosis not present

## 2018-05-06 DIAGNOSIS — I4891 Unspecified atrial fibrillation: Secondary | ICD-10-CM | POA: Diagnosis present

## 2018-05-06 DIAGNOSIS — E785 Hyperlipidemia, unspecified: Secondary | ICD-10-CM | POA: Insufficient documentation

## 2018-05-06 NOTE — Progress Notes (Signed)
  Echocardiogram 2D Echocardiogram has been performed.  April Guerra 05/06/2018, 2:11 PM

## 2018-05-07 ENCOUNTER — Ambulatory Visit (HOSPITAL_COMMUNITY): Payer: Medicare Other | Admitting: Nurse Practitioner

## 2018-05-11 ENCOUNTER — Other Ambulatory Visit (HOSPITAL_COMMUNITY): Payer: Self-pay | Admitting: *Deleted

## 2018-05-11 MED ORDER — APIXABAN 5 MG PO TABS: 5.0000 mg | ORAL_TABLET | Freq: Two times a day (BID) | ORAL | 2 refills | Status: DC

## 2018-05-27 ENCOUNTER — Ambulatory Visit (HOSPITAL_COMMUNITY)
Admission: RE | Admit: 2018-05-27 | Discharge: 2018-05-27 | Disposition: A | Discharge status: Home / Self Care | Payer: Medicare Other | Source: Ambulatory Visit | Attending: Nurse Practitioner | Admitting: Nurse Practitioner

## 2018-05-27 ENCOUNTER — Encounter (HOSPITAL_COMMUNITY): Payer: Self-pay | Admitting: Nurse Practitioner

## 2018-05-27 VITALS — BP 162/84 | HR 58 | Ht 59.0 in | Wt 163.0 lb

## 2018-05-27 DIAGNOSIS — I10 Essential (primary) hypertension: Secondary | ICD-10-CM | POA: Insufficient documentation

## 2018-05-27 DIAGNOSIS — Z9049 Acquired absence of other specified parts of digestive tract: Secondary | ICD-10-CM | POA: Diagnosis not present

## 2018-05-27 DIAGNOSIS — I517 Cardiomegaly: Secondary | ICD-10-CM | POA: Insufficient documentation

## 2018-05-27 DIAGNOSIS — Z79899 Other long term (current) drug therapy: Secondary | ICD-10-CM | POA: Insufficient documentation

## 2018-05-27 DIAGNOSIS — R001 Bradycardia, unspecified: Secondary | ICD-10-CM | POA: Diagnosis not present

## 2018-05-27 DIAGNOSIS — Z8249 Family history of ischemic heart disease and other diseases of the circulatory system: Secondary | ICD-10-CM | POA: Insufficient documentation

## 2018-05-27 DIAGNOSIS — E7800 Pure hypercholesterolemia, unspecified: Secondary | ICD-10-CM | POA: Insufficient documentation

## 2018-05-27 DIAGNOSIS — Z9889 Other specified postprocedural states: Secondary | ICD-10-CM | POA: Insufficient documentation

## 2018-05-27 DIAGNOSIS — I480 Paroxysmal atrial fibrillation: Secondary | ICD-10-CM

## 2018-05-27 DIAGNOSIS — Z7901 Long term (current) use of anticoagulants: Secondary | ICD-10-CM | POA: Diagnosis not present

## 2018-05-27 LAB — CBC
HCT: 39.8 % (ref 36.0–46.0)
HEMOGLOBIN: 13.1 g/dL (ref 12.0–15.0)
MCH: 32 pg (ref 26.0–34.0)
MCHC: 32.9 g/dL (ref 30.0–36.0)
MCV: 97.1 fL (ref 80.0–100.0)
NRBC: 0 % (ref 0.0–0.2)
PLATELETS: 325 10*3/uL (ref 150–400)
RBC: 4.1 MIL/uL (ref 3.87–5.11)
RDW: 12.7 % (ref 11.5–15.5)
WBC: 5.7 10*3/uL (ref 4.0–10.5)

## 2018-05-27 NOTE — Progress Notes (Signed)
Primary Care Physician: Rory Percy, MD Referring Physician: Forestine Na ER f/u    April Guerra is a 72 y.o. female with a h/o HTN, high cholesterol that had new onset of afib, apparently came on without a clear trigger. She was successfully cardioverted. She was placed on eliquis. She does not drink alcohol, does not smoke, minimal caffeine. She snores but her husband does not believe that she has apnea. She is sedentary.  F/u 12/5, in afib clinic. No reoccurrence of afib. Doing well on DOAC, no bleeding issues.Echo showed mod LVH, EF 60-65%.   Today, she denies symptoms of palpitations, chest pain, shortness of breath, orthopnea, PND, lower extremity edema, dizziness, presyncope, syncope, or neurologic sequela. The patient is tolerating medications without difficulties and is otherwise without complaint today.   Past Medical History:  Diagnosis Date  . Asthma   . H/O seasonal allergies   . Hypercholesteremia   . Hypertension    Past Surgical History:  Procedure Laterality Date  . BREAST SURGERY    . CARDIAC CATHETERIZATION    . CHOLECYSTECTOMY    . COLONOSCOPY N/A 08/14/2015   Procedure: COLONOSCOPY;  Surgeon: Aviva Signs, MD;  [facility];  Service: Gastroenterology;  Laterality: N/A;  . THROMBECTOMY FEMORAL ARTERY Right 12/17/2013   Procedure: REPAIR OF RIGHT FEMORAL ARTERY PSEUDOANEURYSM;  Surgeon: Elam Dutch, MD;  Location: [place];  Service: Vascular;  Laterality: Right;    Current Outpatient Medications  Medication Sig Dispense Refill  . acetaminophen (TYLENOL) 500 MG tablet Take 500 mg by mouth every 6 (six) hours as needed.    Marland Kitchen amLODipine (NORVASC) 10 MG tablet Take 10 mg by mouth daily.    Marland Kitchen apixaban (ELIQUIS) 5 MG TABS tablet Take 1 tablet (5 mg total) by mouth 2 (two) times daily. 180 tablet 2  . calcium carbonate (OS-CAL) 600 MG TABS tablet Take 600 mg by mouth 2 (two) times daily.     . cetirizine (ZYRTEC) 10 MG tablet Take 10 mg by mouth daily.     . cholecalciferol (VITAMIN D) 1000 UNITS tablet Take 1,000 Units by mouth daily.     . Coenzyme Q10 (COQ10) 100 MG CAPS Take 1 capsule by mouth daily.    . cyclobenzaprine (FLEXERIL) 5 MG tablet Take 5 mg by mouth daily as needed for muscle spasms.    Marland Kitchen diltiazem (CARDIZEM) 30 MG tablet Take 1 tablet every 4 hours AS NEEDED for AFIB heart rate >100 30 tablet 3  . lisinopril (PRINIVIL,ZESTRIL) 20 MG tablet Take 20 mg by mouth daily.    . Magnesium 100 MG TABS Take by mouth.    . Melatonin 5 MG CAPS Take 1 capsule by mouth at bedtime.    . metoprolol succinate (TOPROL-XL) 100 MG 24 hr tablet Take 100 mg by mouth daily. Take with or immediately following a meal.    . Multiple Vitamins-Minerals (CENTRUM SILVER ADULT 50+ PO) Take 1 tablet by mouth daily.    . ranitidine (ZANTAC) 150 MG tablet Take 150 mg by mouth daily as needed for heartburn.     . simvastatin (ZOCOR) 40 MG tablet Take 0.5 tablets (20 mg total) by mouth daily. (Patient taking differently: Take 40 mg by mouth daily. ) 30 tablet 2  . vitamin C (ASCORBIC ACID) 500 MG tablet Take 500 mg by mouth daily.     No current facility-administered medications for this encounter.     No Known Allergies  Social History   Socioeconomic History  .  Marital status: Married    Spouse name: Not on file  . Number of children: Not on file  . Years of education: Not on file  . Highest education level: Not on file  Occupational History  . Not on file  Social Needs  . Financial resource strain: Not on file  . Food insecurity:    Worry: Not on file    Inability: Not on file  . Transportation needs:    Medical: Not on file    Non-medical: Not on file  Tobacco Use  . Smoking status: Never Smoker  . Smokeless tobacco: Never Used  Substance and Sexual Activity  . Alcohol use: No  . Drug use: No  . Sexual activity: Not Currently  Lifestyle  . Physical activity:    Days per week: Not on file    Minutes per session: Not on file  .  Stress: Not on file  Relationships  . Social connections:    Talks on phone: Not on file    Gets together: Not on file    Attends religious service: Not on file    Active member of club or organization: Not on file    Attends meetings of clubs or organizations: Not on file    Relationship status: Not on file  . Intimate partner violence:    Fear of current or ex partner: Not on file    Emotionally abused: Not on file    Physically abused: Not on file    Forced sexual activity: Not on file  Other Topics Concern  . Not on file  Social History Narrative  . Not on file    Family History  Problem Relation Age of Onset  . Hypertension Mother   . Heart failure Mother     ROS- All systems are reviewed and negative except as per the HPI above  Physical Exam: Vitals:   05/27/18 0951  BP: (!) 162/84  Pulse: (!) 58  Weight: 73.9 kg  Height: 4\' 11"  (1.499 m)   Wt Readings from Last 3 Encounters:  05/27/18 73.9 kg  05/04/18 73.9 kg  05/01/18 72.6 kg    Labs: Lab Results  Component Value Date   NA 139 05/01/2018   K 3.6 05/01/2018   CL 105 05/01/2018   CO2 25 05/01/2018   GLUCOSE 99 05/01/2018   BUN 15 05/01/2018   CREATININE 0.58 05/01/2018   CALCIUM 9.7 05/01/2018   MG 2.2 05/01/2018   No results found for: INR No results found for: CHOL, HDL, LDLCALC, TRIG   GEN- The patient is well appearing, alert and oriented x 3 today.   Head- normocephalic, atraumatic Eyes-  Sclera clear, conjunctiva pink Ears- hearing intact Oropharynx- clear Neck- supple, no JVP Lymph- no cervical lymphadenopathy Lungs- Clear to ausculation bilaterally, normal work of breathing Heart- Regular rate and rhythm, no murmurs, rubs or gallops, PMI not laterally displaced GI- soft, NT, ND, + BS Extremities- no clubbing, cyanosis, or edema MS- no significant deformity or atrophy Skin- no rash or lesion Psych- euthymic mood, full affect Neuro- strength and sensation are intact  EKG-NSR at  58 bpm, PR int 168 ms, qrs int 84 ms, qtc 406 ms   Assessment and Plan: 1. New onset paroxysmal afib  General education of afib reviewed Triggers discussed  Echo reviewed with pt 30 mg cardizem to be used as pill in pocket if episodes of afib return Encouraged to develop  walking routine  2. CHA2DS2VASc score of 3 Continue eliquis 5  mg bid Bleeding precautions discussed  Advised not to take NSAIDS when on anticoagulation  Cbc ordered  3. HTN  Elevated today  States that it is not usually elevated Will recheck later today at home  F/u in afib clinic as needed  Marin. Imri Lor, [facility] [address] [place], [place] [address] [phone]

## 2018-08-23 DIAGNOSIS — M79672 Pain in left foot: Secondary | ICD-10-CM | POA: Diagnosis not present

## 2018-08-23 DIAGNOSIS — E7800 Pure hypercholesterolemia, unspecified: Secondary | ICD-10-CM | POA: Diagnosis not present

## 2018-08-23 DIAGNOSIS — I10 Essential (primary) hypertension: Secondary | ICD-10-CM | POA: Diagnosis not present

## 2018-08-23 DIAGNOSIS — R5382 Chronic fatigue, unspecified: Secondary | ICD-10-CM | POA: Diagnosis not present

## 2018-08-23 DIAGNOSIS — K210 Gastro-esophageal reflux disease with esophagitis: Secondary | ICD-10-CM | POA: Diagnosis not present

## 2018-08-25 DIAGNOSIS — Z0000 Encounter for general adult medical examination without abnormal findings: Secondary | ICD-10-CM | POA: Diagnosis not present

## 2018-11-23 DIAGNOSIS — Z780 Asymptomatic menopausal state: Secondary | ICD-10-CM | POA: Diagnosis not present

## 2018-11-23 DIAGNOSIS — M810 Age-related osteoporosis without current pathological fracture: Secondary | ICD-10-CM | POA: Diagnosis not present

## 2018-11-26 ENCOUNTER — Other Ambulatory Visit: Payer: Self-pay

## 2018-11-26 NOTE — Patient Outreach (Signed)
[facility]) Care Management  11/26/2018  April Guerra Nov 28, 1945 [id number]   Medication Adherence call to April Guerra HIPPA Compliant Voice message left with a call back number. April Guerra is showing past due on Simvastatin 40 mg and Lisinopril 20 mg under [place].   [place] Management Direct Dial [phone]  Fax [phone] [email or web address]

## 2018-12-18 DIAGNOSIS — M1810 Unilateral primary osteoarthritis of first carpometacarpal joint, unspecified hand: Secondary | ICD-10-CM | POA: Diagnosis not present

## 2018-12-18 DIAGNOSIS — M545 Low back pain: Secondary | ICD-10-CM | POA: Diagnosis not present

## 2019-02-07 DIAGNOSIS — M9903 Segmental and somatic dysfunction of lumbar region: Secondary | ICD-10-CM | POA: Diagnosis not present

## 2019-02-07 DIAGNOSIS — M47816 Spondylosis without myelopathy or radiculopathy, lumbar region: Secondary | ICD-10-CM | POA: Diagnosis not present

## 2019-02-07 DIAGNOSIS — M545 Low back pain: Secondary | ICD-10-CM | POA: Diagnosis not present

## 2019-02-10 DIAGNOSIS — M9903 Segmental and somatic dysfunction of lumbar region: Secondary | ICD-10-CM | POA: Diagnosis not present

## 2019-02-10 DIAGNOSIS — M545 Low back pain: Secondary | ICD-10-CM | POA: Diagnosis not present

## 2019-02-10 DIAGNOSIS — M47816 Spondylosis without myelopathy or radiculopathy, lumbar region: Secondary | ICD-10-CM | POA: Diagnosis not present

## 2019-02-14 DIAGNOSIS — M47816 Spondylosis without myelopathy or radiculopathy, lumbar region: Secondary | ICD-10-CM | POA: Diagnosis not present

## 2019-02-14 DIAGNOSIS — M9903 Segmental and somatic dysfunction of lumbar region: Secondary | ICD-10-CM | POA: Diagnosis not present

## 2019-02-14 DIAGNOSIS — M545 Low back pain: Secondary | ICD-10-CM | POA: Diagnosis not present

## 2019-02-17 DIAGNOSIS — M9903 Segmental and somatic dysfunction of lumbar region: Secondary | ICD-10-CM | POA: Diagnosis not present

## 2019-02-17 DIAGNOSIS — M545 Low back pain: Secondary | ICD-10-CM | POA: Diagnosis not present

## 2019-02-17 DIAGNOSIS — M47816 Spondylosis without myelopathy or radiculopathy, lumbar region: Secondary | ICD-10-CM | POA: Diagnosis not present

## 2019-02-23 DIAGNOSIS — M9903 Segmental and somatic dysfunction of lumbar region: Secondary | ICD-10-CM | POA: Diagnosis not present

## 2019-02-23 DIAGNOSIS — M47816 Spondylosis without myelopathy or radiculopathy, lumbar region: Secondary | ICD-10-CM | POA: Diagnosis not present

## 2019-02-23 DIAGNOSIS — M545 Low back pain: Secondary | ICD-10-CM | POA: Diagnosis not present

## 2019-03-02 DIAGNOSIS — M9903 Segmental and somatic dysfunction of lumbar region: Secondary | ICD-10-CM | POA: Diagnosis not present

## 2019-03-02 DIAGNOSIS — M545 Low back pain: Secondary | ICD-10-CM | POA: Diagnosis not present

## 2019-03-02 DIAGNOSIS — M47816 Spondylosis without myelopathy or radiculopathy, lumbar region: Secondary | ICD-10-CM | POA: Diagnosis not present

## 2019-03-09 ENCOUNTER — Other Ambulatory Visit (HOSPITAL_COMMUNITY): Payer: Self-pay | Admitting: Nurse Practitioner

## 2019-03-09 DIAGNOSIS — Z23 Encounter for immunization: Secondary | ICD-10-CM | POA: Diagnosis not present

## 2019-03-11 ENCOUNTER — Other Ambulatory Visit: Payer: Self-pay

## 2019-03-11 NOTE — Patient Outreach (Signed)
[facility]) Care Management  03/11/2019  MOSHE BOAK 19-Mar-1946 [id number]   Medication Adherence call to Mrs. Shavelle Tep [place] Identifiers Verify spoke with patient she is past due on Lisinopril 20 mg patient explain she take 1 tablet daily patient ask to call [facility] to order all her medication from [facility] pharmacy will mail out with in 5-7 days.patient also ask to order husband medication. Mrs. Ellefsen is showing past due under [place].   [place] Management Direct Dial [phone]  Fax [phone] [email or web address]

## 2019-03-24 DIAGNOSIS — M47816 Spondylosis without myelopathy or radiculopathy, lumbar region: Secondary | ICD-10-CM | POA: Diagnosis not present

## 2019-03-24 DIAGNOSIS — M545 Low back pain: Secondary | ICD-10-CM | POA: Diagnosis not present

## 2019-03-24 DIAGNOSIS — M9903 Segmental and somatic dysfunction of lumbar region: Secondary | ICD-10-CM | POA: Diagnosis not present

## 2019-04-07 DIAGNOSIS — Z1231 Encounter for screening mammogram for malignant neoplasm of breast: Secondary | ICD-10-CM | POA: Diagnosis not present

## 2019-04-18 DIAGNOSIS — G5762 Lesion of plantar nerve, left lower limb: Secondary | ICD-10-CM | POA: Diagnosis not present

## 2019-04-18 DIAGNOSIS — M79672 Pain in left foot: Secondary | ICD-10-CM | POA: Diagnosis not present

## 2019-04-29 ENCOUNTER — Emergency Department (HOSPITAL_COMMUNITY)
Admission: EM | Admit: 2019-04-29 | Discharge: 2019-04-29 | Disposition: A | Discharge status: Home / Self Care | Payer: Medicare Other | Attending: Emergency Medicine | Admitting: Emergency Medicine

## 2019-04-29 ENCOUNTER — Other Ambulatory Visit: Payer: Self-pay

## 2019-04-29 ENCOUNTER — Encounter (HOSPITAL_COMMUNITY): Payer: Self-pay | Admitting: Emergency Medicine

## 2019-04-29 DIAGNOSIS — Z7901 Long term (current) use of anticoagulants: Secondary | ICD-10-CM | POA: Diagnosis not present

## 2019-04-29 DIAGNOSIS — J45909 Unspecified asthma, uncomplicated: Secondary | ICD-10-CM | POA: Diagnosis not present

## 2019-04-29 DIAGNOSIS — Z79899 Other long term (current) drug therapy: Secondary | ICD-10-CM | POA: Diagnosis not present

## 2019-04-29 DIAGNOSIS — I4891 Unspecified atrial fibrillation: Secondary | ICD-10-CM | POA: Diagnosis not present

## 2019-04-29 DIAGNOSIS — I10 Essential (primary) hypertension: Secondary | ICD-10-CM | POA: Diagnosis not present

## 2019-04-29 DIAGNOSIS — R42 Dizziness and giddiness: Secondary | ICD-10-CM | POA: Diagnosis not present

## 2019-04-29 DIAGNOSIS — R002 Palpitations: Secondary | ICD-10-CM | POA: Diagnosis present

## 2019-04-29 DIAGNOSIS — R000 Tachycardia, unspecified: Secondary | ICD-10-CM | POA: Diagnosis not present

## 2019-04-29 HISTORY — DX: Unspecified atrial fibrillation: I48.91

## 2019-04-29 LAB — BASIC METABOLIC PANEL
Anion gap: 10 (ref 5–15)
BUN: 18 mg/dL (ref 8–23)
CO2: 22 mmol/L (ref 22–32)
Calcium: 9.6 mg/dL (ref 8.9–10.3)
Chloride: 104 mmol/L (ref 98–111)
Creatinine, Ser: 0.73 mg/dL (ref 0.44–1.00)
GFR calc Af Amer: >60 mL/min (ref >60)
GFR calc non Af Amer: >60 mL/min (ref >60)
Glucose, Bld: 102 mg/dL — ABNORMAL HIGH (ref 70–99)
Potassium: 3.8 mmol/L (ref 3.5–5.1)
Sodium: 136 mmol/L (ref 135–145)

## 2019-04-29 LAB — CBC WITH DIFFERENTIAL/PLATELET
Abs Immature Granulocytes: 0.01 10*3/uL (ref 0.00–0.07)
Basophils Absolute: 0 10*3/uL (ref 0.0–0.1)
Basophils Relative: 0 %
Eosinophils Absolute: 0.1 10*3/uL (ref 0.0–0.5)
Eosinophils Relative: 1 %
HCT: 42.9 % (ref 36.0–46.0)
Hemoglobin: 14.3 g/dL (ref 12.0–15.0)
Immature Granulocytes: 0 %
Lymphocytes Relative: 21 %
Lymphs Abs: 1.6 10*3/uL (ref 0.7–4.0)
MCH: 32.7 pg (ref 26.0–34.0)
MCHC: 33.3 g/dL (ref 30.0–36.0)
MCV: 98.2 fL (ref 80.0–100.0)
Monocytes Absolute: 0.8 10*3/uL (ref 0.1–1.0)
Monocytes Relative: 11 %
Neutro Abs: 5.2 10*3/uL (ref 1.7–7.7)
Neutrophils Relative %: 67 %
Platelets: 403 10*3/uL — ABNORMAL HIGH (ref 150–400)
RBC: 4.37 MIL/uL (ref 3.87–5.11)
RDW: 12.7 % (ref 11.5–15.5)
WBC: 7.8 10*3/uL (ref 4.0–10.5)
nRBC: 0 % (ref 0.0–0.2)

## 2019-04-29 LAB — MAGNESIUM: Magnesium: 2.2 mg/dL (ref 1.7–2.4)

## 2019-04-29 MED ORDER — DILTIAZEM LOAD VIA INFUSION
20.0000 mg | Freq: Once | INTRAVENOUS | Status: AC
  Administered 2019-04-29: 20 mg via INTRAVENOUS
  Filled 2019-04-29: qty 20

## 2019-04-29 MED ORDER — METOPROLOL SUCCINATE ER 50 MG PO TB24: 50.0000 mg | ORAL_TABLET | Freq: Every day | ORAL | 0 refills | Status: DC

## 2019-04-29 MED ORDER — DILTIAZEM HCL-DEXTROSE 125-5 MG/125ML-% IV SOLN (PREMIX)
5.0000 mg/h | INTRAVENOUS | Status: DC
  Administered 2019-04-29: 5 mg/h via INTRAVENOUS
  Filled 2019-04-29: qty 125

## 2019-04-29 NOTE — ED Triage Notes (Signed)
States her heart rate has felt like it was fast for the past 2 days

## 2019-04-29 NOTE — Discharge Instructions (Addendum)
Continue to take your previously prescribed metoprolol in the morning.  Take an additional dose in the evening as I am prescribing you today.  You need to make an appointment to follow-up with your cardiologist within the next week.

## 2019-04-29 NOTE — ED Provider Notes (Signed)
[facility] EMERGENCY DEPARTMENT Provider Note   CSN: [id number] Arrival date & time: 04/29/19  1335     History   Chief Complaint Chief Complaint  Patient presents with  . Tachycardia    HPI April Guerra is a 73 y.o. female.     HPI   73 year old female with palpitations.  She feels like her heart is racing fast and irregular.  She has a past history of atrial fibrillation.  She feels like she may be in it currently.  Intermittent symptoms the past 2 days.  More sustained today.  Feels somewhat lightheaded with activity.  No pain.  No acute dyspnea.  No unusual swelling.  She reports compliance with all her medications.  Past Medical History:  Diagnosis Date  . A-fib ([place])   . Asthma   . H/O seasonal allergies   . Hypercholesteremia   . Hypertension     Patient Active Problem List   Diagnosis Date Noted  . Pseudoaneurysm ([place]) 01/12/2014  . 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 BREAST SURGERY    . CARDIAC CATHETERIZATION    . CHOLECYSTECTOMY    . COLONOSCOPY N/A 08/14/2015   Procedure: COLONOSCOPY;  Surgeon: Aviva Signs, MD;  [facility];  Service: Gastroenterology;  Laterality: N/A;  . THROMBECTOMY FEMORAL ARTERY Right 12/17/2013   Procedure: REPAIR OF RIGHT FEMORAL ARTERY PSEUDOANEURYSM;  Surgeon: Elam Dutch, MD;  Location: [place];  Service: Vascular;  Laterality: Right;     OB History   No obstetric history on file.      Home Medications    Prior to Admission medications   Medication Sig Start Date End Date Taking? Authorizing Provider  acetaminophen (TYLENOL) 500 MG tablet Take 500 mg by mouth every 6 (six) hours as needed.    [provider]  amLODipine (NORVASC) 10 MG tablet Take 10 mg by mouth daily.    [provider]  calcium carbonate (OS-CAL) 600 MG TABS tablet Take 600 mg by mouth 2 (two) times daily.     [provider]  cetirizine  (ZYRTEC) 10 MG tablet Take 10 mg by mouth daily.    [provider]  cholecalciferol (VITAMIN D) 1000 UNITS tablet Take 1,000 Units by mouth daily.     [provider]  Coenzyme Q10 (COQ10) 100 MG CAPS Take 1 capsule by mouth daily.    [provider]  cyclobenzaprine (FLEXERIL) 5 MG tablet Take 5 mg by mouth daily as needed for muscle spasms.    [provider]  diltiazem (CARDIZEM) 30 MG tablet Take 1 tablet every 4 hours AS NEEDED for AFIB heart rate >100 05/04/18   Sherran Needs, NP  ELIQUIS 5 MG TABS tablet Take 1 tablet by mouth twice daily 03/10/19   Sherran Needs, NP  lisinopril (PRINIVIL,ZESTRIL) 20 MG tablet Take 20 mg by mouth daily.    [provider]  Magnesium 100 MG TABS Take by mouth.    [provider]  Melatonin 5 MG CAPS Take 1 capsule by mouth at bedtime.    [provider]  metoprolol succinate (TOPROL-XL) 100 MG 24 hr tablet Take 100 mg by mouth daily. Take with or immediately following a meal.    [provider]  Multiple Vitamins-Minerals (CENTRUM SILVER ADULT 50+ PO) Take 1 tablet by mouth daily.    [provider]  ranitidine (ZANTAC) 150 MG tablet Take 150 mg  by mouth daily as needed for heartburn.     [provider]  simvastatin (ZOCOR) 40 MG tablet Take 0.5 tablets (20 mg total) by mouth daily. Patient taking differently: Take 40 mg by mouth daily.  12/19/13   Rhyne, Hulen Shouts, PA-C  vitamin C (ASCORBIC ACID) 500 MG tablet Take 500 mg by mouth daily.    [provider]    Family History Family History  Problem Relation Age of Onset  . Hypertension Mother   . Heart failure Mother     Social History Social History   Tobacco Use  . Smoking status: Never Smoker  . Smokeless tobacco: Never Used  Substance Use Topics  . Alcohol use: No  . Drug use: No     Allergies   Patient has no known allergies.   Review of Systems Review of Systems  All systems  reviewed and negative, other than as noted in HPI. Physical Exam Updated Vital Signs BP (!) 131/95 (BP Location: Right Arm)   Pulse (!) 127   Temp 98.1 F (36.7 C) (Oral)   Resp 19   Ht 4\' 10"  (1.473 m)   Wt 72.6 kg   SpO2 99%   BMI 33.44 kg/m   Physical Exam Vitals signs and nursing note reviewed.  Constitutional:      General: She is not in acute distress.    Appearance: She is well-developed.  HENT:     Head: Normocephalic and atraumatic.  Eyes:     General:        Right eye: No discharge.        Left eye: No discharge.     Conjunctiva/sclera: Conjunctivae normal.  Neck:     Musculoskeletal: Neck supple.  Cardiovascular:     Rate and Rhythm: Tachycardia present. Rhythm irregular.     Heart sounds: Normal heart sounds. No murmur. No friction rub. No gallop.   Pulmonary:     Effort: Pulmonary effort is normal. No respiratory distress.     Breath sounds: Normal breath sounds.  Abdominal:     General: There is no distension.     Palpations: Abdomen is soft.     Tenderness: There is no abdominal tenderness.  Musculoskeletal:        General: No tenderness.  Skin:    General: Skin is warm and dry.  Neurological:     Mental Status: She is alert.  Psychiatric:        Behavior: Behavior normal.        Thought Content: Thought content normal.      ED Treatments / Results  Labs (all labs ordered are listed, but only abnormal results are displayed) Labs Reviewed - No data to display  EKG EKG Interpretation  Date/Time:  Friday April 29 2019 15:34:32 EST Ventricular Rate:  72 PR Interval:    QRS Duration: 90 QT Interval:  380 QTC Calculation: 416 R Axis:   15 Text Interpretation: Atrial fibrillation Consider left ventricular hypertrophy Anterior Q waves, possibly due to LVH Since last tracing rate slower Confirmed by Aletta Edouard [phone]) on 04/30/2019 11:38:22 AM   Radiology No results found.  Procedures Procedures (including critical care time)   CRITICAL CARE Performed by: Virgel Manifold Total critical care time: 35 minutes Critical care time was exclusive of separately billable procedures and treating other patients. Critical care was necessary to treat or prevent imminent or life-threatening deterioration. Critical care was time spent personally by me on the following activities: development of treatment plan with  patient and/or surrogate as well as nursing, discussions with consultants, evaluation of patient's response to treatment, examination of patient, obtaining history from patient or surrogate, ordering and performing treatments and interventions, ordering and review of laboratory studies, ordering and review of radiographic studies, pulse oximetry and re-evaluation of patient's condition.   Medications Ordered in ED Medications - No data to display   Initial Impression / Assessment and Plan / ED Course  I have reviewed the triage vital signs and the nursing notes.  Pertinent labs & imaging results that were available during my care of the patient were reviewed by me and considered in my medical decision making (see chart for details).        73 year old female with A. fib with RVR. Hx of the same on eliquis and reports compliance. Recommended cardioversion.  She declined.  Discussed the benefits/alternatives.  Still declining.  She was rate controlled after cardizem bolus. Feels better. She is already on metoprolol.  Will increase until she can follow-up with cards to discuss further. Continue anticoagulation.    Final Clinical Impressions(s) / ED Diagnoses   Final diagnoses:  Atrial fibrillation with RVR [facility])    ED Discharge Orders    None       Virgel Manifold, MD 05/01/19 [phone]

## 2019-05-06 ENCOUNTER — Telehealth (HOSPITAL_COMMUNITY): Payer: Self-pay | Admitting: *Deleted

## 2019-05-06 NOTE — Telephone Encounter (Signed)
Patient called in after recent ER visit. Since increasing metoprolol per ER instructions has felt well. HR in the 60s according to her BP cuff its regular. Pt has appt with PCP next week - if feels cardiology visit needed she will call back.

## 2019-05-09 DIAGNOSIS — I10 Essential (primary) hypertension: Secondary | ICD-10-CM | POA: Diagnosis not present

## 2019-05-09 DIAGNOSIS — R5382 Chronic fatigue, unspecified: Secondary | ICD-10-CM | POA: Diagnosis not present

## 2019-05-09 DIAGNOSIS — K2100 Gastro-esophageal reflux disease with esophagitis, without bleeding: Secondary | ICD-10-CM | POA: Diagnosis not present

## 2019-05-09 DIAGNOSIS — E7800 Pure hypercholesterolemia, unspecified: Secondary | ICD-10-CM | POA: Diagnosis not present

## 2019-06-15 ENCOUNTER — Other Ambulatory Visit (HOSPITAL_COMMUNITY): Payer: Self-pay | Admitting: Nurse Practitioner

## 2019-07-15 ENCOUNTER — Other Ambulatory Visit (HOSPITAL_COMMUNITY): Payer: Self-pay | Admitting: *Deleted

## 2019-07-15 MED ORDER — APIXABAN 5 MG PO TABS: 5.0000 mg | ORAL_TABLET | Freq: Two times a day (BID) | ORAL | 0 refills | Status: DC

## 2019-07-22 DIAGNOSIS — I10 Essential (primary) hypertension: Secondary | ICD-10-CM | POA: Diagnosis not present

## 2019-07-22 DIAGNOSIS — K2100 Gastro-esophageal reflux disease with esophagitis, without bleeding: Secondary | ICD-10-CM | POA: Diagnosis not present

## 2019-08-19 DIAGNOSIS — I10 Essential (primary) hypertension: Secondary | ICD-10-CM | POA: Diagnosis not present

## 2019-08-26 DIAGNOSIS — R5382 Chronic fatigue, unspecified: Secondary | ICD-10-CM | POA: Diagnosis not present

## 2019-08-26 DIAGNOSIS — I10 Essential (primary) hypertension: Secondary | ICD-10-CM | POA: Diagnosis not present

## 2019-08-26 DIAGNOSIS — E7800 Pure hypercholesterolemia, unspecified: Secondary | ICD-10-CM | POA: Diagnosis not present

## 2019-08-26 DIAGNOSIS — K2100 Gastro-esophageal reflux disease with esophagitis, without bleeding: Secondary | ICD-10-CM | POA: Diagnosis not present

## 2019-08-30 DIAGNOSIS — Z0000 Encounter for general adult medical examination without abnormal findings: Secondary | ICD-10-CM | POA: Diagnosis not present

## 2019-08-30 DIAGNOSIS — M545 Low back pain: Secondary | ICD-10-CM | POA: Diagnosis not present

## 2019-08-30 DIAGNOSIS — I10 Essential (primary) hypertension: Secondary | ICD-10-CM | POA: Diagnosis not present

## 2019-08-30 DIAGNOSIS — E7800 Pure hypercholesterolemia, unspecified: Secondary | ICD-10-CM | POA: Diagnosis not present

## 2019-08-30 DIAGNOSIS — M7742 Metatarsalgia, left foot: Secondary | ICD-10-CM | POA: Diagnosis not present

## 2019-09-15 DIAGNOSIS — M4316 Spondylolisthesis, lumbar region: Secondary | ICD-10-CM | POA: Diagnosis not present

## 2019-09-15 DIAGNOSIS — D6869 Other thrombophilia: Secondary | ICD-10-CM | POA: Diagnosis not present

## 2019-09-15 DIAGNOSIS — M5136 Other intervertebral disc degeneration, lumbar region: Secondary | ICD-10-CM | POA: Insufficient documentation

## 2019-09-15 DIAGNOSIS — I4891 Unspecified atrial fibrillation: Secondary | ICD-10-CM | POA: Diagnosis not present

## 2019-09-15 DIAGNOSIS — M5416 Radiculopathy, lumbar region: Secondary | ICD-10-CM | POA: Diagnosis not present

## 2019-09-15 DIAGNOSIS — M51369 Other intervertebral disc degeneration, lumbar region without mention of lumbar back pain or lower extremity pain: Secondary | ICD-10-CM | POA: Insufficient documentation

## 2019-09-21 DIAGNOSIS — E7849 Other hyperlipidemia: Secondary | ICD-10-CM | POA: Diagnosis not present

## 2019-09-21 DIAGNOSIS — I10 Essential (primary) hypertension: Secondary | ICD-10-CM | POA: Diagnosis not present

## 2019-10-11 DIAGNOSIS — M5136 Other intervertebral disc degeneration, lumbar region: Secondary | ICD-10-CM | POA: Diagnosis not present

## 2019-10-25 DIAGNOSIS — M5136 Other intervertebral disc degeneration, lumbar region: Secondary | ICD-10-CM | POA: Diagnosis not present

## 2019-10-25 DIAGNOSIS — M5416 Radiculopathy, lumbar region: Secondary | ICD-10-CM | POA: Diagnosis not present

## 2019-10-25 DIAGNOSIS — M4316 Spondylolisthesis, lumbar region: Secondary | ICD-10-CM | POA: Diagnosis not present

## 2019-11-07 DIAGNOSIS — H524 Presbyopia: Secondary | ICD-10-CM | POA: Diagnosis not present

## 2019-11-21 DIAGNOSIS — I4891 Unspecified atrial fibrillation: Secondary | ICD-10-CM | POA: Diagnosis not present

## 2019-11-21 DIAGNOSIS — M1810 Unilateral primary osteoarthritis of first carpometacarpal joint, unspecified hand: Secondary | ICD-10-CM | POA: Diagnosis not present

## 2019-11-21 DIAGNOSIS — I10 Essential (primary) hypertension: Secondary | ICD-10-CM | POA: Diagnosis not present

## 2019-12-01 DIAGNOSIS — S20469A Insect bite (nonvenomous) of unspecified back wall of thorax, initial encounter: Secondary | ICD-10-CM | POA: Diagnosis not present

## 2020-01-12 DIAGNOSIS — R300 Dysuria: Secondary | ICD-10-CM | POA: Diagnosis not present

## 2020-02-04 DIAGNOSIS — J019 Acute sinusitis, unspecified: Secondary | ICD-10-CM | POA: Diagnosis not present

## 2020-02-04 DIAGNOSIS — H6691 Otitis media, unspecified, right ear: Secondary | ICD-10-CM | POA: Diagnosis not present

## 2020-02-04 DIAGNOSIS — R05 Cough: Secondary | ICD-10-CM | POA: Diagnosis not present

## 2020-03-22 DIAGNOSIS — M1810 Unilateral primary osteoarthritis of first carpometacarpal joint, unspecified hand: Secondary | ICD-10-CM | POA: Diagnosis not present

## 2020-03-22 DIAGNOSIS — I4891 Unspecified atrial fibrillation: Secondary | ICD-10-CM | POA: Diagnosis not present

## 2020-03-22 DIAGNOSIS — R300 Dysuria: Secondary | ICD-10-CM | POA: Diagnosis not present

## 2020-03-22 DIAGNOSIS — I10 Essential (primary) hypertension: Secondary | ICD-10-CM | POA: Diagnosis not present

## 2020-03-22 DIAGNOSIS — Z7689 Persons encountering health services in other specified circumstances: Secondary | ICD-10-CM | POA: Diagnosis not present

## 2020-04-02 DIAGNOSIS — G5762 Lesion of plantar nerve, left lower limb: Secondary | ICD-10-CM | POA: Diagnosis not present

## 2020-04-05 DIAGNOSIS — R3914 Feeling of incomplete bladder emptying: Secondary | ICD-10-CM | POA: Diagnosis not present

## 2020-04-05 DIAGNOSIS — R350 Frequency of micturition: Secondary | ICD-10-CM | POA: Diagnosis not present

## 2020-04-05 DIAGNOSIS — R1031 Right lower quadrant pain: Secondary | ICD-10-CM | POA: Diagnosis not present

## 2020-04-06 ENCOUNTER — Other Ambulatory Visit: Payer: Self-pay | Admitting: Urology

## 2020-04-06 ENCOUNTER — Other Ambulatory Visit (HOSPITAL_COMMUNITY): Payer: Self-pay | Admitting: Urology

## 2020-04-06 DIAGNOSIS — R1031 Right lower quadrant pain: Secondary | ICD-10-CM

## 2020-04-06 DIAGNOSIS — R1900 Intra-abdominal and pelvic swelling, mass and lump, unspecified site: Secondary | ICD-10-CM

## 2020-04-10 DIAGNOSIS — R1031 Right lower quadrant pain: Secondary | ICD-10-CM | POA: Diagnosis not present

## 2020-04-11 DIAGNOSIS — R1031 Right lower quadrant pain: Secondary | ICD-10-CM | POA: Diagnosis not present

## 2020-04-11 DIAGNOSIS — C651 Malignant neoplasm of right renal pelvis: Secondary | ICD-10-CM | POA: Diagnosis not present

## 2020-04-12 ENCOUNTER — Ambulatory Visit (HOSPITAL_COMMUNITY)
Admission: RE | Admit: 2020-04-12 | Discharge: 2020-04-12 | Disposition: A | Discharge status: Home / Self Care | Payer: Medicare Other | Source: Ambulatory Visit | Attending: Urology | Admitting: Urology

## 2020-04-12 ENCOUNTER — Other Ambulatory Visit: Payer: Self-pay

## 2020-04-12 DIAGNOSIS — Z780 Asymptomatic menopausal state: Secondary | ICD-10-CM | POA: Diagnosis not present

## 2020-04-12 DIAGNOSIS — R1031 Right lower quadrant pain: Secondary | ICD-10-CM

## 2020-04-12 DIAGNOSIS — R1909 Other intra-abdominal and pelvic swelling, mass and lump: Secondary | ICD-10-CM | POA: Diagnosis not present

## 2020-04-13 ENCOUNTER — Ambulatory Visit
Admission: RE | Admit: 2020-04-13 | Discharge: 2020-04-13 | Disposition: A | Discharge status: Home / Self Care | Payer: Self-pay | Source: Ambulatory Visit | Attending: Gynecologic Oncology | Admitting: Gynecologic Oncology

## 2020-04-13 ENCOUNTER — Other Ambulatory Visit: Payer: Self-pay

## 2020-04-13 ENCOUNTER — Telehealth: Payer: Self-pay | Admitting: *Deleted

## 2020-04-13 DIAGNOSIS — R109 Unspecified abdominal pain: Secondary | ICD-10-CM

## 2020-04-13 DIAGNOSIS — R1900 Intra-abdominal and pelvic swelling, mass and lump, unspecified site: Secondary | ICD-10-CM

## 2020-04-13 NOTE — Telephone Encounter (Signed)
Called the patient and scheduled a new patient appt. Patient schedule for an appt on 10/29 at 9:45 am with Dr. Denman George. Patient giving the address and phone number for the clinic. Patient also given the policy for mask and visitors

## 2020-04-16 ENCOUNTER — Telehealth: Payer: Self-pay | Admitting: *Deleted

## 2020-04-16 NOTE — Telephone Encounter (Signed)
Told Ms Scipio that she needs to contact Dr. Jeffie Pollock regarding symptoms as she has not been seen in our office per Joylene John, NP.

## 2020-04-16 NOTE — Telephone Encounter (Signed)
Patient called and stated "I am scheduled to see Dr Denman George on Friday. I started having some vaginal period type bleeding since Saturday. I am on Eliquis. I am not soaking pads and have no clots. I do have pain and cramping. I did have an [place] on Thursday. I was uncomfortable." Explained that the message would be giving to [facility] APP and someone would call her back

## 2020-04-17 DIAGNOSIS — R350 Frequency of micturition: Secondary | ICD-10-CM | POA: Diagnosis not present

## 2020-04-17 DIAGNOSIS — R311 Benign essential microscopic hematuria: Secondary | ICD-10-CM | POA: Diagnosis not present

## 2020-04-17 DIAGNOSIS — R310 Gross hematuria: Secondary | ICD-10-CM | POA: Diagnosis not present

## 2020-04-19 ENCOUNTER — Encounter: Payer: Self-pay | Admitting: Gynecologic Oncology

## 2020-04-19 ENCOUNTER — Encounter (HOSPITAL_COMMUNITY): Payer: Self-pay

## 2020-04-19 ENCOUNTER — Ambulatory Visit (HOSPITAL_COMMUNITY): Payer: Medicare Other

## 2020-04-19 ENCOUNTER — Other Ambulatory Visit (HOSPITAL_COMMUNITY): Payer: Medicare Other

## 2020-04-19 DIAGNOSIS — I10 Essential (primary) hypertension: Secondary | ICD-10-CM | POA: Diagnosis not present

## 2020-04-19 DIAGNOSIS — I4891 Unspecified atrial fibrillation: Secondary | ICD-10-CM | POA: Diagnosis not present

## 2020-04-20 ENCOUNTER — Encounter (INDEPENDENT_AMBULATORY_CARE_PROVIDER_SITE_OTHER): Payer: Self-pay

## 2020-04-20 ENCOUNTER — Inpatient Hospital Stay (HOSPITAL_BASED_OUTPATIENT_CLINIC_OR_DEPARTMENT_OTHER): Payer: Medicare Other | Admitting: Gynecologic Oncology

## 2020-04-20 ENCOUNTER — Inpatient Hospital Stay: Payer: Medicare Other | Attending: Gynecologic Oncology

## 2020-04-20 ENCOUNTER — Other Ambulatory Visit: Payer: Self-pay

## 2020-04-20 ENCOUNTER — Other Ambulatory Visit: Payer: Self-pay | Admitting: Gynecologic Oncology

## 2020-04-20 ENCOUNTER — Telehealth: Payer: Self-pay | Admitting: *Deleted

## 2020-04-20 VITALS — BP 152/70 | HR 65 | Temp 97.8°F | Resp 18 | Ht <= 58 in | Wt 164.0 lb

## 2020-04-20 DIAGNOSIS — R971 Elevated cancer antigen 125 [CA 125]: Secondary | ICD-10-CM | POA: Insufficient documentation

## 2020-04-20 DIAGNOSIS — R9389 Abnormal findings on diagnostic imaging of other specified body structures: Secondary | ICD-10-CM

## 2020-04-20 DIAGNOSIS — R1900 Intra-abdominal and pelvic swelling, mass and lump, unspecified site: Secondary | ICD-10-CM

## 2020-04-20 DIAGNOSIS — Z7901 Long term (current) use of anticoagulants: Secondary | ICD-10-CM | POA: Insufficient documentation

## 2020-04-20 DIAGNOSIS — Z79899 Other long term (current) drug therapy: Secondary | ICD-10-CM | POA: Insufficient documentation

## 2020-04-20 DIAGNOSIS — N950 Postmenopausal bleeding: Secondary | ICD-10-CM

## 2020-04-20 DIAGNOSIS — E669 Obesity, unspecified: Secondary | ICD-10-CM | POA: Diagnosis not present

## 2020-04-20 DIAGNOSIS — I480 Paroxysmal atrial fibrillation: Secondary | ICD-10-CM | POA: Diagnosis not present

## 2020-04-20 DIAGNOSIS — Z6834 Body mass index (BMI) 34.0-34.9, adult: Secondary | ICD-10-CM | POA: Insufficient documentation

## 2020-04-20 DIAGNOSIS — E7800 Pure hypercholesterolemia, unspecified: Secondary | ICD-10-CM | POA: Insufficient documentation

## 2020-04-20 DIAGNOSIS — J45909 Unspecified asthma, uncomplicated: Secondary | ICD-10-CM | POA: Insufficient documentation

## 2020-04-20 DIAGNOSIS — I10 Essential (primary) hypertension: Secondary | ICD-10-CM | POA: Insufficient documentation

## 2020-04-20 MED ORDER — OXYCODONE HCL 5 MG PO TABS: 5.0000 mg | ORAL_TABLET | Freq: Four times a day (QID) | ORAL | 0 refills | Status: DC | PRN

## 2020-04-20 NOTE — Patient Instructions (Signed)
Preparing for your Surgery  Plan for surgery on May 03, 2020 with Dr. Everitt Amber at [facility] will be scheduled for a robotic assisted total laparoscopic hysterectomy (removal of the uterus and cervix), bilateral salpingo-oophorectomy (removal of both ovaries and fallopian tubes), mini laparotomy (larger incision on your abdomen), possible staging if a cancer is identified.   STOP TAKING YOUR ELIQUIS 4 DAYS BEFORE SURGERY (LAST DOSE ON NOV 6). STOP FISH OIL AND TURMERIC NOW. YOU CAN RESUME YOUR ELIQUIS 1 WEEK AFTER SURGERY (START TAKING May 11, 2020).  We will contact you with the results of your endometrial biopsy from today. We will also contact you with the results of your lab work from today (tumor markers CA 125 and inhibin)  Pre-operative Testing -You will receive a phone call from presurgical testing at [facility] to arrange for a pre-operative appointment, lab appointment, and COVID test. The COVID test normally happens 3 days prior to the surgery and they ask that you self quarantine after the test up until surgery to decrease chance of exposure.  -Bring your insurance card, copy of an advanced directive if applicable, medication list  -At that visit, you will be asked to sign a consent for a possible blood transfusion in case a transfusion becomes necessary during surgery.  The need for a blood transfusion is rare but having consent is a necessary part of your care.     -You should not be taking blood thinners or aspirin at least ten days prior to surgery unless instructed by your surgeon.  -Do not take supplements such as fish oil (omega 3), red yeast rice, turmeric before your surgery.   Day Before Surgery at [place] will be asked to take in a light diet the day before surgery. You will be advised you can have clear liquids up until 3 hours before your surgery.    Eat a light diet the day before surgery.  Examples including soups, broths, toast,  yogurt, mashed potatoes.  AVOID GAS PRODUCING FOODS. Things to avoid include carbonated beverages (fizzy beverages), raw fruits and raw vegetables, or beans.   If your bowels are filled with gas, your surgeon will have difficulty visualizing your pelvic organs which increases your surgical risks.  Your role in recovery Your role is to become active as soon as directed by your doctor, while still giving yourself time to heal.  Rest when you feel tired. You will be asked to do the following in order to speed your recovery:  - Cough and breathe deeply. This helps to clear and expand your lungs and can prevent pneumonia after surgery.  - [place]. Do mild physical activity. Walking or moving your legs help your circulation and body functions return to normal. Do not try to get up or walk alone the first time after surgery.   -If you develop swelling on one leg or the other, pain in the back of your leg, redness/warmth in one of your legs, please call the office or go to the Emergency Room to have a doppler to rule out a blood clot. For shortness of breath, chest pain-seek care in the Emergency Room as soon as possible. - Actively manage your pain. Managing your pain lets you move in comfort. We will ask you to rate your pain on a scale of zero to 10. It is your responsibility to tell your doctor or nurse where and how much you hurt so your pain can  be treated.  Special Considerations -If you are diabetic, you may be placed on insulin after surgery to have closer control over your blood sugars to promote healing and recovery.  This does not mean that you will be discharged on insulin.  If applicable, your oral antidiabetics will be resumed when you are tolerating a solid diet.  -Your final pathology results from surgery should be available around one week after surgery and the results will be relayed to you when available.  -Dr. Lahoma Crocker is the surgeon that assists your GYN  Oncologist with surgery.  If you end up staying the night, the next day after your surgery you will either see Dr. Denman George, Dr. Berline Lopes, or Dr. Lahoma Crocker.  -FMLA forms can be faxed to [phone] and please allow 5-7 business days for completion.  Pain Management After Surgery -You have been prescribed your pain medication before surgery so that you can have these available when you are discharged from the hospital. The pain medication is for use ONLY AFTER surgery and a new prescription will not be given.   -Make sure that you have Tylenol and Aleve at home to use on a regular basis after surgery for pain control. We recommend alternating the medications every six to eight hours since they work differently and are processed in the body differently for pain relief.  -Review the attached handout on narcotic use and their risks and side effects.   Bowel Regimen -You can continue taking the sennakot that you take at home.  It is important to prevent constipation and drink adequate amounts of liquids. You can stop taking this medication when you are not taking pain medication and you are back on your normal bowel routine.  Risks of Surgery Risks of surgery are low but include bleeding, infection, damage to surrounding structures, re-operation, blood clots, and very rarely death.   Blood Transfusion Information (For the consent to be signed before surgery)  We will be checking your blood type before surgery so in case of emergencies, we will know what type of blood you would need.                                            WHAT IS A BLOOD TRANSFUSION?  A transfusion is the replacement of blood or some of its parts. Blood is made up of multiple cells which provide different functions.  Red blood cells carry oxygen and are used for blood loss replacement.  White blood cells fight against infection.  Platelets control bleeding.  Plasma helps clot blood.  Other blood products are  available for specialized needs, such as hemophilia or other clotting disorders. BEFORE THE TRANSFUSION  Who gives blood for transfusions?   You may be able to donate blood to be used at a later date on yourself (autologous donation).  Relatives can be asked to donate blood. This is generally not any safer than if you have received blood from a stranger. The same precautions are taken to ensure safety when a relative's blood is donated.  Healthy volunteers who are fully evaluated to make sure their blood is safe. This is blood bank blood. Transfusion therapy is the safest it has ever been in the practice of medicine. Before blood is taken from a donor, a complete history is taken to make sure that person has no history of diseases nor engages in  risky social behavior (examples are intravenous drug use or sexual activity with multiple partners). The donor's travel history is screened to minimize risk of transmitting infections, such as malaria. The donated blood is tested for signs of infectious diseases, such as HIV and hepatitis. The blood is then tested to be sure it is compatible with you in order to minimize the chance of a transfusion reaction. If you or a relative donates blood, this is often done in anticipation of surgery and is not appropriate for emergency situations. It takes many days to process the donated blood. RISKS AND COMPLICATIONS Although transfusion therapy is very safe and saves many lives, the main dangers of transfusion include:   Getting an infectious disease.  Developing a transfusion reaction. This is an allergic reaction to something in the blood you were given. Every precaution is taken to prevent this. The decision to have a blood transfusion has been considered carefully by your caregiver before blood is given. Blood is not given unless the benefits outweigh the risks.  AFTER SURGERY INSTRUCTIONS  Activity: 1. Be up and out of the bed during the day.  Take a nap if  needed.  You may walk up steps but be careful and use the hand rail.  Stair climbing will tire you more than you think, you may need to stop part way and rest.   2. No lifting or straining for 6 weeks over 10 pounds. No pushing, pulling, straining for 6 weeks.  3. No driving for 1 week(s) if you were cleared to drive before surgery.  Do not drive if you are taking narcotic pain medicine and make sure that your reaction time has returned.   4. You can shower as soon as the next day after surgery. Shower daily.  Use your regular soap and water (not directly on the incision) and pat your incision(s) dry afterwards; don't rub.  No tub baths or submerging your body in water until cleared by your surgeon. If you have the soap that was given to you by pre-surgical testing that was used before surgery, you do not need to use it afterwards because this can irritate your incisions.   5. No sexual activity and nothing in the vagina for 8 weeks.  6. You may experience a small amount of clear drainage from your incisions, which is normal.  If the drainage persists, increases, or changes color please call the office.  7. Do not use creams, lotions, or ointments such as neosporin on your incisions after surgery until advised by your surgeon because they can cause removal of the dermabond glue on your incisions.    8. You may experience vaginal spotting after surgery or around the 6-8 week mark from surgery when the stitches at the top of the vagina begin to dissolve.  The spotting is normal but if you experience heavy bleeding, call our office.  9. Take Tylenol or Aleve first for pain and only use narcotic pain medication for severe pain not relieved by the Tylenol or Aleve.  Monitor your Tylenol intake to a max of 4,000 mg in a 24 hour period. You can alternate these medications after surgery.  Diet: 1. Low sodium Heart Healthy Diet is recommended but you are cleared to resume your normal (before surgery) diet  after your procedure.  2. It is safe to use a laxative, such as Miralax/Colace/or Sennnakot, if you have difficulty moving your bowels. You can continue taking the sennakot you take at home.  Wound Care:  1. Keep clean and dry.  Shower daily.  Reasons to call the Doctor:  Fever - Oral temperature greater than 100.4 degrees Fahrenheit  Foul-smelling vaginal discharge  Difficulty urinating  Nausea and vomiting  Increased pain at the site of the incision that is unrelieved with pain medicine.  Difficulty breathing with or without chest pain  New calf pain especially if only on one side  Sudden, continuing increased vaginal bleeding with or without clots.   Contacts: For questions or concerns you should contact:  Dr. Everitt Amber at [phone]  Joylene John, NP at [phone]  After Hours: call [phone] and have the GYN Oncologist paged/contacted (after 5 pm or on the weekends)   Endometrial Biopsy, Care After This sheet gives you information about how to care for yourself after your procedure. Your health care provider may also give you more specific instructions. If you have problems or questions, contact your health care provider. What can I expect after the procedure? After the procedure, it is common to have:  Mild cramping.  A small amount of vaginal bleeding for a few days. This is normal. Follow these instructions at home:   Take over-the-counter and prescription medicines only as told by your health care provider.  Do not douche, use tampons, or have sexual intercourse until your health care provider approves.  Return to your normal activities as told by your health care provider. Ask your health care provider what activities are safe for you.  Follow instructions from your health care provider about any activity restrictions, such as restrictions on strenuous exercise or heavy lifting. Contact a health care provider if:  You have heavy bleeding, or  bleed for longer than 2 days after the procedure.  You have bad smelling discharge from your vagina.  You have a fever or chills.  You have a burning sensation when urinating or you have difficulty urinating.  You have severe pain in your lower abdomen. Get help right away if:  You have severe cramps in your stomach or back.  You pass large blood clots.  Your bleeding increases.  You become weak or light-headed, or you pass out. Summary  After the procedure, it is common to have mild cramping and a small amount of vaginal bleeding for a few days.  Do not douche, use tampons, or have sexual intercourse until your health care provider approves.  Return to your normal activities as told by your health care provider. Ask your health care provider what activities are safe for you. This information is not intended to replace advice given to you by your health care provider. Make sure you discuss any questions you have with your health care provider. Document Revised: 05/22/2017 Document Reviewed: 06/25/2016 Elsevier Patient Education  [place].

## 2020-04-20 NOTE — Progress Notes (Signed)
Consult Note: Gyn-Onc  Consult was requested by Dr. Jeffie Pollock for the evaluation of April Guerra 74 y.o. female  CC:  Chief Complaint  Patient presents with  . Pelvic mass    Assessment/Plan:  April Guerra  is a 74 y.o.  year old with postmenopausal bleeding and a complex pelvic mass.  While the cystic mass has some features that are reassuring, overall the complexity is worrisome for possible occult cancer.  I favor proceeding with surgery, particular because she is very symptomatic from this mass compressing her bladder and because of my concern for underlying malignancy.  We will follow-up today's CA-125 value, though the patient was informed this is of low specificity.  I recommend surgery with a robotic assisted total hysterectomy BSO.  She will require mini laparotomy for specimen removal.  If malignancy is identified on frozen section of the ovary, she will require staging procedure as indicated by the histology.  We will evaluate the result from today's endometrial biopsy.  If this shows an endometrial cancer, she will require sentinel lymph node biopsy procedure at the time of her staging.  I counseled the patient regarding risks of surgery.  She is at an increased risk for bleeding complications due to her history of Eliquis use.  Her Eliquis is prescribed for atrial fibrillation without heart failure, therefore we will discontinue this 4 days preoperatively and hold for an additional 1 week postoperatively restarting if she has had no evidence of bleeding complications.  She has been instructed discontinue fish oil and turmeric preoperatively.  I explained risks of infection and damage to adjacent structures.  She understands if malignancy is present and she has adherence of the tumor to her bladder or intestinal structures she is at increased risk for complication and damage of these organs.  Surgery is being scheduled for early November.   HPI: Ms April Guerra is a 74 year old  P4 who was seen in consultation at the request of Dr Jeffie Pollock for evaluation of a complex ovarian mass and postmenopausal bleeding.  The patient reported approximately 18-month history of progressive urinary frequency.  This was initially worked up by testing and treating empirically for urinary tract infections.  When that failed to resolve her symptoms she was referred to a urologist, Dr. Jeffie Pollock, who performed a pelvic examination which palpated a mass in the pelvis.  Due to concern that her urinary frequency symptoms were secondary to extrinsic compression a CT scan of the abdomen and pelvis was ordered.  Of note her urinary analysis was fairly unremarkable.  CT abdomen and pelvis performed at [facility] on 04/11/2020 showed a 14.5 x 14 x 12 cm solid and cystic right ovarian mass worrisome for ovarian carcinoma.  There were no findings for intraperitoneal metastatic disease.  There is no lymphadenopathy present.  A transvaginal ultrasound on 04/12/2020 confirmed this finding of an ovarian mass.  The uterus itself measured 7.2 x 3.2 x 4.6 cm with a 6 mm endometrium.  The right ovary was not visualized.  The left ovary was not visualized.  There was a large complex cystic mass filling the pelvis measuring 14.5 x 10.9 x 13.4 cm.  The majority of this was a complicated cystic locule.  However inferiorly was complex solid and cystic intracystic mass identified estimated to be 5.7 x 6.5 x 5 cm with multiple septations.  There was blood flow within the mural nodule.  Her medical history is most significant for paroxysmal atrial fibrillation.  She takes  Eliquis for this.  She has had cardiac conversion in the past.  She has obesity with a BMI of 34 kg per metered squared.  Additionally her medical history is significant for hypertension..  Her surgical history is most significant for a benign breast biopsy and a laparoscopic cholecystectomy.  Her gynecologic history is remarkable for 4 prior vaginal  deliveries.  Her family cancer history is unremarkable for cancer history.  She lives with her husband.  She is a retired Marine scientist.  Current Meds:  Outpatient Encounter Medications as of 04/20/2020  Medication Sig  . acetaminophen (TYLENOL) 500 MG tablet Take 500 mg by mouth every 6 (six) hours as needed.  Marland Kitchen amLODipine (NORVASC) 10 MG tablet Take 10 mg by mouth daily.  Marland Kitchen apixaban (ELIQUIS) 5 MG TABS tablet Take 1 tablet (5 mg total) by mouth 2 (two) times daily.  . calcium carbonate (OS-CAL) 600 MG TABS tablet Take 600 mg by mouth 2 (two) times daily.   . cetirizine (ZYRTEC) 10 MG tablet Take 10 mg by mouth daily.  . Coenzyme Q10 (COQ10) 100 MG CAPS Take 1 capsule by mouth daily.  . cyclobenzaprine (FLEXERIL) 5 MG tablet Take 5 mg by mouth daily as needed for muscle spasms.  Marland Kitchen diltiazem (CARDIZEM) 30 MG tablet Take 1 tablet every 4 hours AS NEEDED for AFIB heart rate >100  . famotidine (PEPCID) 40 MG tablet Take 40 mg by mouth daily as needed.   Marland Kitchen lisinopril (PRINIVIL,ZESTRIL) 20 MG tablet Take 20 mg by mouth daily.  . Magnesium 100 MG TABS Take 50 mg by mouth at bedtime as needed.   . Melatonin 5 MG CAPS Take 1 capsule by mouth at bedtime as needed.   . metoprolol succinate (TOPROL-XL) 100 MG 24 hr tablet Take 100 mg by mouth daily. Take with or immediately following a meal.  . Multiple Vitamins-Minerals (CENTRUM SILVER ADULT 50+ PO) Take 1 tablet by mouth daily.  . simvastatin (ZOCOR) 40 MG tablet Take 0.5 tablets (20 mg total) by mouth daily. (Patient taking differently: Take 40 mg by mouth daily at 6 PM. Takes 1 (40mg ) tablet daily at hs)  . vitamin C (ASCORBIC ACID) 500 MG tablet Take 500 mg by mouth daily.  . [DISCONTINUED] cholecalciferol (VITAMIN D) 1000 UNITS tablet Take 1,000 Units by mouth daily.   Marland Kitchen albuterol (VENTOLIN HFA) 108 (90 Base) MCG/ACT inhaler Inhale 2 puffs into the lungs every 4 (four) hours as needed.  . cyclobenzaprine (FLEXERIL) 10 MG tablet Take 10 mg by mouth at  bedtime.  . fluticasone (FLONASE) 50 MCG/ACT nasal spray Place into both nostrils daily.  Marland Kitchen oxyCODONE (OXY IR/ROXICODONE) 5 MG immediate release tablet Take 1 tablet (5 mg total) by mouth every 6 (six) hours as needed for severe pain. For AFTER surgery only, start with 0.5 tablet, do not take and drive  . triamcinolone cream (KENALOG) 0.1 % SMARTSIG:1 Application Topical 2-3 Times Daily  . [DISCONTINUED] metoprolol succinate (TOPROL-XL) 50 MG 24 hr tablet Take 1 tablet (50 mg total) by mouth daily.  . [DISCONTINUED] ranitidine (ZANTAC) 150 MG tablet Take 150 mg by mouth daily as needed for heartburn.    No facility-administered encounter medications on file as of 04/20/2020.    Allergy: No Known Allergies  Social Hx:   Social History   Socioeconomic History  . Marital status: Married    Spouse name: Not on file  . Number of children: Not on file  . Years of education: Not on file  . Highest  education level: Not on file  Occupational History  . Occupation: nurse  Tobacco Use  . Smoking status: Never Smoker  . Smokeless tobacco: Never Used  Vaping Use  . Vaping Use: Never used  Substance and Sexual Activity  . Alcohol use: No  . Drug use: No  . Sexual activity: Not Currently  Other Topics Concern  . Not on file  Social History Narrative  . Not on file   Social Determinants of Health   Financial Resource Strain:   . Difficulty of Paying Living Expenses: Not on file  Food Insecurity:   . Worried About Charity fundraiser in the Last Year: Not on file  . Ran Out of Food in the Last Year: Not on file  Transportation Needs:   . Lack of Transportation (Medical): Not on file  . Lack of Transportation (Non-Medical): Not on file  Physical Activity:   . Days of Exercise per Week: Not on file  . Minutes of Exercise per Session: Not on file  Stress:   . Feeling of Stress : Not on file  Social Connections:   . Frequency of Communication with Friends and Family: Not on file  .  Frequency of Social Gatherings with Friends and Family: Not on file  . Attends Religious Services: Not on file  . Active Member of Clubs or Organizations: Not on file  . Attends Archivist Meetings: Not on file  . Marital Status: Not on file  Intimate Partner Violence:   . Fear of Current or Ex-Partner: Not on file  . Emotionally Abused: Not on file  . Physically Abused: Not on file  . Sexually Abused: Not on file    Past Surgical Hx:  Past Surgical History:  Procedure Laterality Date  . BREAST SURGERY    . CARDIAC CATHETERIZATION    . CHOLECYSTECTOMY    . COLONOSCOPY N/A 08/14/2015   Procedure: COLONOSCOPY;  Surgeon: Aviva Signs, MD;  Location: [facility];  Service: Gastroenterology;  Laterality: N/A;  . THROMBECTOMY FEMORAL ARTERY Right 12/17/2013   Procedure: REPAIR OF RIGHT FEMORAL ARTERY PSEUDOANEURYSM;  Surgeon: Elam Dutch, MD;  Location: [facility];  Service: Vascular;  Laterality: Right;    Past Medical Hx:  Past Medical History:  Diagnosis Date  . A-fib ([place])   . Asthma   . H/O seasonal allergies   . Hypercholesteremia   . Hypertension     Past Gynecological History:  See HPI (SVD x 4) No LMP recorded. Patient is postmenopausal.  Family Hx:  Family History  Problem Relation Age of Onset  . Hypertension Mother   . Heart failure Mother   . Asthma Mother   . Benign prostatic hyperplasia Father   . Ulcers Father   . Esophageal varices Father   . Atrial fibrillation Sister   . Atrial fibrillation Brother   . Heart disease Brother     Review of Systems:  Constitutional  Feels well,   ENT Normal appearing ears and nares bilaterally Skin/Breast  No rash, sores, jaundice, itching, dryness Cardiovascular  No chest pain, shortness of breath, or edema  Pulmonary  No cough or wheeze.  Gastro Intestinal  No nausea, vomitting, or diarrhoea. No bright red blood per rectum, no abdominal pain, change in bowel movement, or constipation.  Genito  Urinary  No frequency, urgency, dysuria, see HPI Musculo Skeletal  No myalgia, arthralgia, joint swelling or pain  Neurologic  No weakness, numbness, change in gait,  Psychology  No depression, anxiety,  insomnia.   Vitals:  Blood pressure (!) 152/70, pulse 65, temperature 97.8 F (36.6 C), temperature source Tympanic, resp. rate 18, height 4\' 10"  (1.473 m), weight 164 lb (74.4 kg), SpO2 99 %.  Physical Exam: WD in NAD Neck  Supple NROM, without any enlargements.  Lymph Node Survey No cervical supraclavicular or inguinal adenopathy Cardiovascular  Pulse normal rate, regularity and rhythm. S1 and S2 normal.  Lungs  Clear to auscultation bilateraly, without wheezes/crackles/rhonchi. Good air movement.  Skin  No rash/lesions/breakdown  Psychiatry  Alert and oriented to person, place, and time  Abdomen  Normoactive bowel sounds, abdomen soft, non-tender and obese without evidence of hernia.  Back No CVA tenderness Genito Urinary  Vulva/vagina: Normal external female genitalia.  No lesions. No discharge or bleeding.  Bladder/urethra:  No lesions or masses, well supported bladder  Vagina: normal  Cervix: Normal appearing, no lesions.  Uterus: Small, mobile, no parametrial involvement or nodularity.  Adnexa:fullness in upper pelvis Rectal  Good tone, no masses no cul de sac nodularity.  Extremities  No bilateral cyanosis, clubbing or edema.  Procedure Note:  Preop Dx: Postmenopausal bleeding and thickened endometrium Postop Dx: Same Procedure: Endometrial biopsy Surgeon: Dorann Ou, MD EBL: Scant Specimens: Endometrium to histopathology Complications: None Procedure Details: The patient provided verbal consent and verbal timeout was performed.  The speculum was inserted to the vagina and the cervix was visualized.  An endometrial Pipelle was passed to 6 cm depth and aspirated for a scant amount of endometrial tissue.  2 passes from the endometrium took place.  Minimal bleeding  took place.  Specimen was sent for surgical pathology.  The patient tolerated procedure well.   60 minutes of total time was spent for this patient encounter, including preparation, face-to-face counseling with the patient and coordination of care, review of imaging (results and images), communication with the referring provider and documentation of the encounter.   Thereasa Solo, MD  04/20/2020, 2:33 PM

## 2020-04-20 NOTE — Telephone Encounter (Signed)
Error

## 2020-04-20 NOTE — H&P (View-Only) (Signed)
Consult Note: Gyn-Onc  Consult was requested by Dr. Jeffie Pollock for the evaluation of April Guerra 74 y.o. female  CC:  Chief Complaint  Patient presents with  . Pelvic mass    Assessment/Plan:  April Guerra  is a 74 y.o.  year old with postmenopausal bleeding and a complex pelvic mass.  While the cystic mass has some features that are reassuring, overall the complexity is worrisome for possible occult cancer.  I favor proceeding with surgery, particular because she is very symptomatic from this mass compressing her bladder and because of my concern for underlying malignancy.  We will follow-up today's CA-125 value, though the patient was informed this is of low specificity.  I recommend surgery with a robotic assisted total hysterectomy BSO.  She will require mini laparotomy for specimen removal.  If malignancy is identified on frozen section of the ovary, she will require staging procedure as indicated by the histology.  We will evaluate the result from today's endometrial biopsy.  If this shows an endometrial cancer, she will require sentinel lymph node biopsy procedure at the time of her staging.  I counseled the patient regarding risks of surgery.  She is at an increased risk for bleeding complications due to her history of Eliquis use.  Her Eliquis is prescribed for atrial fibrillation without heart failure, therefore we will discontinue this 4 days preoperatively and hold for an additional 1 week postoperatively restarting if she has had no evidence of bleeding complications.  She has been instructed discontinue fish oil and turmeric preoperatively.  I explained risks of infection and damage to adjacent structures.  She understands if malignancy is present and she has adherence of the tumor to her bladder or intestinal structures she is at increased risk for complication and damage of these organs.  Surgery is being scheduled for early November.   HPI: Ms April Guerra is a 74 year old  P4 who was seen in consultation at the request of Dr Jeffie Pollock for evaluation of a complex ovarian mass and postmenopausal bleeding.  The patient reported approximately 33-month history of progressive urinary frequency.  This was initially worked up by testing and treating empirically for urinary tract infections.  When that failed to resolve her symptoms she was referred to a urologist, Dr. Jeffie Pollock, who performed a pelvic examination which palpated a mass in the pelvis.  Due to concern that her urinary frequency symptoms were secondary to extrinsic compression a CT scan of the abdomen and pelvis was ordered.  Of note her urinary analysis was fairly unremarkable.  CT abdomen and pelvis performed at [facility] on 04/11/2020 showed a 14.5 x 14 x 12 cm solid and cystic right ovarian mass worrisome for ovarian carcinoma.  There were no findings for intraperitoneal metastatic disease.  There is no lymphadenopathy present.  A transvaginal ultrasound on 04/12/2020 confirmed this finding of an ovarian mass.  The uterus itself measured 7.2 x 3.2 x 4.6 cm with a 6 mm endometrium.  The right ovary was not visualized.  The left ovary was not visualized.  There was a large complex cystic mass filling the pelvis measuring 14.5 x 10.9 x 13.4 cm.  The majority of this was a complicated cystic locule.  However inferiorly was complex solid and cystic intracystic mass identified estimated to be 5.7 x 6.5 x 5 cm with multiple septations.  There was blood flow within the mural nodule.  Her medical history is most significant for paroxysmal atrial fibrillation.  She takes  Eliquis for this.  She has had cardiac conversion in the past.  She has obesity with a BMI of 34 kg per metered squared.  Additionally her medical history is significant for hypertension..  Her surgical history is most significant for a benign breast biopsy and a laparoscopic cholecystectomy.  Her gynecologic history is remarkable for 4 prior vaginal  deliveries.  Her family cancer history is unremarkable for cancer history.  She lives with her husband.  She is a retired Marine scientist.  Current Meds:  Outpatient Encounter Medications as of 04/20/2020  Medication Sig  . acetaminophen (TYLENOL) 500 MG tablet Take 500 mg by mouth every 6 (six) hours as needed.  Marland Kitchen amLODipine (NORVASC) 10 MG tablet Take 10 mg by mouth daily.  Marland Kitchen apixaban (ELIQUIS) 5 MG TABS tablet Take 1 tablet (5 mg total) by mouth 2 (two) times daily.  . calcium carbonate (OS-CAL) 600 MG TABS tablet Take 600 mg by mouth 2 (two) times daily.   . cetirizine (ZYRTEC) 10 MG tablet Take 10 mg by mouth daily.  . Coenzyme Q10 (COQ10) 100 MG CAPS Take 1 capsule by mouth daily.  . cyclobenzaprine (FLEXERIL) 5 MG tablet Take 5 mg by mouth daily as needed for muscle spasms.  Marland Kitchen diltiazem (CARDIZEM) 30 MG tablet Take 1 tablet every 4 hours AS NEEDED for AFIB heart rate >100  . famotidine (PEPCID) 40 MG tablet Take 40 mg by mouth daily as needed.   Marland Kitchen lisinopril (PRINIVIL,ZESTRIL) 20 MG tablet Take 20 mg by mouth daily.  . Magnesium 100 MG TABS Take 50 mg by mouth at bedtime as needed.   . Melatonin 5 MG CAPS Take 1 capsule by mouth at bedtime as needed.   . metoprolol succinate (TOPROL-XL) 100 MG 24 hr tablet Take 100 mg by mouth daily. Take with or immediately following a meal.  . Multiple Vitamins-Minerals (CENTRUM SILVER ADULT 50+ PO) Take 1 tablet by mouth daily.  . simvastatin (ZOCOR) 40 MG tablet Take 0.5 tablets (20 mg total) by mouth daily. (Patient taking differently: Take 40 mg by mouth daily at 6 PM. Takes 1 (40mg ) tablet daily at hs)  . vitamin C (ASCORBIC ACID) 500 MG tablet Take 500 mg by mouth daily.  . [DISCONTINUED] cholecalciferol (VITAMIN D) 1000 UNITS tablet Take 1,000 Units by mouth daily.   Marland Kitchen albuterol (VENTOLIN HFA) 108 (90 Base) MCG/ACT inhaler Inhale 2 puffs into the lungs every 4 (four) hours as needed.  . cyclobenzaprine (FLEXERIL) 10 MG tablet Take 10 mg by mouth at  bedtime.  . fluticasone (FLONASE) 50 MCG/ACT nasal spray Place into both nostrils daily.  Marland Kitchen oxyCODONE (OXY IR/ROXICODONE) 5 MG immediate release tablet Take 1 tablet (5 mg total) by mouth every 6 (six) hours as needed for severe pain. For AFTER surgery only, start with 0.5 tablet, do not take and drive  . triamcinolone cream (KENALOG) 0.1 % SMARTSIG:1 Application Topical 2-3 Times Daily  . [DISCONTINUED] metoprolol succinate (TOPROL-XL) 50 MG 24 hr tablet Take 1 tablet (50 mg total) by mouth daily.  . [DISCONTINUED] ranitidine (ZANTAC) 150 MG tablet Take 150 mg by mouth daily as needed for heartburn.    No facility-administered encounter medications on file as of 04/20/2020.    Allergy: No Known Allergies  Social Hx:   Social History   Socioeconomic History  . Marital status: Married    Spouse name: Not on file  . Number of children: Not on file  . Years of education: Not on file  . Highest  education level: Not on file  Occupational History  . Occupation: nurse  Tobacco Use  . Smoking status: Never Smoker  . Smokeless tobacco: Never Used  Vaping Use  . Vaping Use: Never used  Substance and Sexual Activity  . Alcohol use: No  . Drug use: No  . Sexual activity: Not Currently  Other Topics Concern  . Not on file  Social History Narrative  . Not on file   Social Determinants of Health   Financial Resource Strain:   . Difficulty of Paying Living Expenses: Not on file  Food Insecurity:   . Worried About Charity fundraiser in the Last Year: Not on file  . Ran Out of Food in the Last Year: Not on file  Transportation Needs:   . Lack of Transportation (Medical): Not on file  . Lack of Transportation (Non-Medical): Not on file  Physical Activity:   . Days of Exercise per Week: Not on file  . Minutes of Exercise per Session: Not on file  Stress:   . Feeling of Stress : Not on file  Social Connections:   . Frequency of Communication with Friends and Family: Not on file  .  Frequency of Social Gatherings with Friends and Family: Not on file  . Attends Religious Services: Not on file  . Active Member of Clubs or Organizations: Not on file  . Attends Archivist Meetings: Not on file  . Marital Status: Not on file  Intimate Partner Violence:   . Fear of Current or Ex-Partner: Not on file  . Emotionally Abused: Not on file  . Physically Abused: Not on file  . Sexually Abused: Not on file    Past Surgical Hx:  Past Surgical History:  Procedure Laterality Date  . BREAST SURGERY    . CARDIAC CATHETERIZATION    . CHOLECYSTECTOMY    . COLONOSCOPY N/A 08/14/2015   Procedure: COLONOSCOPY;  Surgeon: Aviva Signs, MD;  Location: [facility];  Service: Gastroenterology;  Laterality: N/A;  . THROMBECTOMY FEMORAL ARTERY Right 12/17/2013   Procedure: REPAIR OF RIGHT FEMORAL ARTERY PSEUDOANEURYSM;  Surgeon: Elam Dutch, MD;  Location: [facility];  Service: Vascular;  Laterality: Right;    Past Medical Hx:  Past Medical History:  Diagnosis Date  . A-fib ([place])   . Asthma   . H/O seasonal allergies   . Hypercholesteremia   . Hypertension     Past Gynecological History:  See HPI (SVD x 4) No LMP recorded. Patient is postmenopausal.  Family Hx:  Family History  Problem Relation Age of Onset  . Hypertension Mother   . Heart failure Mother   . Asthma Mother   . Benign prostatic hyperplasia Father   . Ulcers Father   . Esophageal varices Father   . Atrial fibrillation Sister   . Atrial fibrillation Brother   . Heart disease Brother     Review of Systems:  Constitutional  Feels well,   ENT Normal appearing ears and nares bilaterally Skin/Breast  No rash, sores, jaundice, itching, dryness Cardiovascular  No chest pain, shortness of breath, or edema  Pulmonary  No cough or wheeze.  Gastro Intestinal  No nausea, vomitting, or diarrhoea. No bright red blood per rectum, no abdominal pain, change in bowel movement, or constipation.  Genito  Urinary  No frequency, urgency, dysuria, see HPI Musculo Skeletal  No myalgia, arthralgia, joint swelling or pain  Neurologic  No weakness, numbness, change in gait,  Psychology  No depression, anxiety,  insomnia.   Vitals:  Blood pressure (!) 152/70, pulse 65, temperature 97.8 F (36.6 C), temperature source Tympanic, resp. rate 18, height 4\' 10"  (1.473 m), weight 164 lb (74.4 kg), SpO2 99 %.  Physical Exam: WD in NAD Neck  Supple NROM, without any enlargements.  Lymph Node Survey No cervical supraclavicular or inguinal adenopathy Cardiovascular  Pulse normal rate, regularity and rhythm. S1 and S2 normal.  Lungs  Clear to auscultation bilateraly, without wheezes/crackles/rhonchi. Good air movement.  Skin  No rash/lesions/breakdown  Psychiatry  Alert and oriented to person, place, and time  Abdomen  Normoactive bowel sounds, abdomen soft, non-tender and obese without evidence of hernia.  Back No CVA tenderness Genito Urinary  Vulva/vagina: Normal external female genitalia.  No lesions. No discharge or bleeding.  Bladder/urethra:  No lesions or masses, well supported bladder  Vagina: normal  Cervix: Normal appearing, no lesions.  Uterus: Small, mobile, no parametrial involvement or nodularity.  Adnexa:fullness in upper pelvis Rectal  Good tone, no masses no cul de sac nodularity.  Extremities  No bilateral cyanosis, clubbing or edema.  Procedure Note:  Preop Dx: Postmenopausal bleeding and thickened endometrium Postop Dx: Same Procedure: Endometrial biopsy Surgeon: Dorann Ou, MD EBL: Scant Specimens: Endometrium to histopathology Complications: None Procedure Details: The patient provided verbal consent and verbal timeout was performed.  The speculum was inserted to the vagina and the cervix was visualized.  An endometrial Pipelle was passed to 6 cm depth and aspirated for a scant amount of endometrial tissue.  2 passes from the endometrium took place.  Minimal bleeding  took place.  Specimen was sent for surgical pathology.  The patient tolerated procedure well.   60 minutes of total time was spent for this patient encounter, including preparation, face-to-face counseling with the patient and coordination of care, review of imaging (results and images), communication with the referring provider and documentation of the encounter.   Thereasa Solo, MD  04/20/2020, 2:33 PM

## 2020-04-21 DIAGNOSIS — I4891 Unspecified atrial fibrillation: Secondary | ICD-10-CM | POA: Diagnosis not present

## 2020-04-21 DIAGNOSIS — I10 Essential (primary) hypertension: Secondary | ICD-10-CM | POA: Diagnosis not present

## 2020-04-21 DIAGNOSIS — M1810 Unilateral primary osteoarthritis of first carpometacarpal joint, unspecified hand: Secondary | ICD-10-CM | POA: Diagnosis not present

## 2020-04-21 LAB — CA 125: Cancer Antigen (CA) 125: 114 U/mL — ABNORMAL HIGH (ref 0.0–38.1)

## 2020-04-23 LAB — SURGICAL PATHOLOGY

## 2020-04-23 NOTE — Progress Notes (Signed)
DUE TO COVID-19 ONLY ONE VISITOR IS ALLOWED TO COME WITH YOU AND STAY IN THE WAITING ROOM ONLY DURING PRE OP AND PROCEDURE DAY OF SURGERY. THE 1 VISITOR  MAY VISIT WITH YOU AFTER SURGERY IN YOUR PRIVATE ROOM DURING VISITING HOURS ONLY!  YOU NEED TO HAVE A COVID 19 TEST ON__11/01/2020 _____ @_______ , THIS TEST MUST BE DONE BEFORE SURGERY,  COVID TESTING SITE [address] [place] [address], IT IS ON THE RIGHT GOING OUT [address] APPROXIMATELY  2 MINUTES PAST [employer] ON THE RIGHT. ONCE YOUR COVID TEST IS COMPLETED,  PLEASE BEGIN THE QUARANTINE INSTRUCTIONS AS OUTLINED IN YOUR HANDOUT.                April Guerra  04/23/2020   Your procedure is scheduled on: 05/03/2020    Report to [facility] Main  Entrance   Report to admitting at    1030 AM     Call this number if you have problems the morning of surgery [phone]    Remember: Do not eat food , candy gum or mints :After Midnight. You may have clear liquids from midnight until 0930am    CLEAR LIQUID DIET   Foods Allowed                                                                       Coffee and tea, regular and decaf                              Plain Jell-O any favor except red or purple                                            Fruit ices (not with fruit pulp)                                      Iced Popsicles                                     Carbonated beverages, regular and diet                                    Cranberry, grape and apple juices Sports drinks like Gatorade Lightly seasoned clear broth or consume(fat free) Sugar, honey syrup   _____________________________________________________________________    BRUSH YOUR TEETH MORNING OF SURGERY AND RINSE YOUR MOUTH OUT, NO CHEWING GUM CANDY OR MINTS.     Take these medicines the morning of surgery with A SIP OF WATER:  Inhalers as usual and bring, d ltiazem, flonase, metoprolol                                  You may not have any metal on your body including  hair pins and              piercings  Do not wear jewelry, make-up, lotions, powders or perfumes, deodorant             Do not wear nail polish on your fingernails.  Do not shave  48 hours prior to surgery.              Men may shave face and neck.   Do not bring valuables to the hospital. [place].  Contacts, dentures or bridgework may not be worn into surgery.  Leave suitcase in the car. After surgery it may be brought to your room.     Patients discharged the day of surgery will not be allowed to drive home. IF YOU ARE HAVING SURGERY AND GOING HOME THE SAME DAY, YOU MUST HAVE AN ADULT TO DRIVE YOU HOME AND BE WITH YOU FOR 24 HOURS. YOU MAY GO HOME BY TAXI OR UBER OR ORTHERWISE, BUT AN ADULT MUST ACCOMPANY YOU HOME AND STAY WITH YOU FOR 24 HOURS.  Name and phone number of your driver:  Special Instructions: N/A              Please read over the following fact sheets you were given: _____________________________________________________________________  [facility] - Preparing for Surgery Before surgery, you can play an important role.  Because skin is not sterile, your skin needs to be as free of germs as possible.  You can reduce the number of germs on your skin by washing with CHG (chlorahexidine gluconate) soap before surgery.  CHG is an antiseptic cleaner which kills germs and bonds with the skin to continue killing germs even after washing. Please DO NOT use if you have an allergy to CHG or antibacterial soaps.  If your skin becomes reddened/irritated stop using the CHG and inform your nurse when you arrive at Short Stay. Do not shave (including legs and underarms) for at least 48 hours prior to the first CHG shower.  You may shave your face/neck. Please follow these instructions carefully:  1.  Shower with CHG Soap the night before surgery and the  morning of Surgery.  2.  If you choose  to wash your hair, wash your hair first as usual with your  normal  shampoo.  3.  After you shampoo, rinse your hair and body thoroughly to remove the  shampoo.                           4.  Use CHG as you would any other liquid soap.  You can apply chg directly  to the skin and wash                       Gently with a scrungie or clean washcloth.  5.  Apply the CHG Soap to your body ONLY FROM THE NECK DOWN.   Do not use on face/ open                           Wound or open sores. Avoid contact with eyes, ears mouth and genitals (private parts).                       Wash face,  Genitals (private parts) with  your normal soap.             6.  Wash thoroughly, paying special attention to the area where your surgery  will be performed.  7.  Thoroughly rinse your body with warm water from the neck down.  8.  DO NOT shower/wash with your normal soap after using and rinsing off  the CHG Soap.                9.  Pat yourself dry with a clean towel.            10.  Wear clean pajamas.            11.  Place clean sheets on your bed the night of your first shower and do not  sleep with pets. Day of Surgery : Do not apply any lotions/deodorants the morning of surgery.  Please wear clean clothes to the hospital/surgery center.  FAILURE TO FOLLOW THESE INSTRUCTIONS MAY RESULT IN THE CANCELLATION OF YOUR SURGERY PATIENT SIGNATURE_________________________________  NURSE SIGNATURE__________________________________  ________________________________________________________________________

## 2020-04-24 ENCOUNTER — Telehealth: Payer: Self-pay

## 2020-04-24 LAB — INHIBIN B: Inhibin B: 7.8 pg/mL (ref 0.0–16.9)

## 2020-04-24 NOTE — Telephone Encounter (Signed)
LM for April Guerra to call the office for results of lab and pathology from 04-20-20.

## 2020-04-25 ENCOUNTER — Encounter (HOSPITAL_COMMUNITY): Payer: Self-pay

## 2020-04-25 ENCOUNTER — Encounter (HOSPITAL_COMMUNITY)
Admission: RE | Admit: 2020-04-25 | Discharge: 2020-04-25 | Disposition: A | Discharge status: Home / Self Care | Payer: Medicare Other | Source: Ambulatory Visit | Attending: Gynecologic Oncology | Admitting: Gynecologic Oncology

## 2020-04-25 ENCOUNTER — Other Ambulatory Visit: Payer: Self-pay

## 2020-04-25 DIAGNOSIS — Z01818 Encounter for other preprocedural examination: Secondary | ICD-10-CM | POA: Diagnosis not present

## 2020-04-25 HISTORY — DX: Dyspnea, unspecified: R06.00

## 2020-04-25 HISTORY — DX: Anxiety disorder, unspecified: F41.9

## 2020-04-25 HISTORY — DX: Cardiac murmur, unspecified: R01.1

## 2020-04-25 HISTORY — DX: Unspecified osteoarthritis, unspecified site: M19.90

## 2020-04-25 LAB — CBC
HCT: 42.5 % (ref 36.0–46.0)
Hemoglobin: 14.7 g/dL (ref 12.0–15.0)
MCH: 34 pg (ref 26.0–34.0)
MCHC: 34.6 g/dL (ref 30.0–36.0)
MCV: 98.4 fL (ref 80.0–100.0)
Platelets: 319 10*3/uL (ref 150–400)
RBC: 4.32 MIL/uL (ref 3.87–5.11)
RDW: 12.6 % (ref 11.5–15.5)
WBC: 6.9 10*3/uL (ref 4.0–10.5)
nRBC: 0 % (ref 0.0–0.2)

## 2020-04-25 LAB — COMPREHENSIVE METABOLIC PANEL
ALT: 21 U/L (ref 0–44)
AST: 24 U/L (ref 15–41)
Albumin: 4.7 g/dL (ref 3.5–5.0)
Alkaline Phosphatase: 45 U/L (ref 38–126)
Anion gap: 10 (ref 5–15)
BUN: 15 mg/dL (ref 8–23)
CO2: 25 mmol/L (ref 22–32)
Calcium: 9.5 mg/dL (ref 8.9–10.3)
Chloride: 100 mmol/L (ref 98–111)
Creatinine, Ser: 0.62 mg/dL (ref 0.44–1.00)
GFR, Estimated: >60 mL/min (ref >60)
Glucose, Bld: 95 mg/dL (ref 70–99)
Potassium: 4 mmol/L (ref 3.5–5.1)
Sodium: 135 mmol/L (ref 135–145)
Total Bilirubin: 0.6 mg/dL (ref 0.3–1.2)
Total Protein: 7.7 g/dL (ref 6.5–8.1)

## 2020-04-25 LAB — URINALYSIS, ROUTINE W REFLEX MICROSCOPIC
Bacteria, UA: NONE SEEN
Bilirubin Urine: NEGATIVE
Glucose, UA: NEGATIVE mg/dL
Ketones, ur: NEGATIVE mg/dL
Leukocytes,Ua: NEGATIVE
Nitrite: NEGATIVE
Protein, ur: NEGATIVE mg/dL
Specific Gravity, Urine: 1.005 (ref 1.005–1.030)
pH: 8 (ref 5.0–8.0)

## 2020-04-25 NOTE — Progress Notes (Addendum)
Anesthesia Review:  PCP: DR Rory Percy  11/21/19 and 12/01/19- last office visits  Cardiologist :none  Chest x-ray : EKG :04/25/2020  Last ekg - 04/29/2019  Echo :2019  Cardioversoin - 2019  Stress test: Cardiac Cath :  Activity level: can do a flight of stairs without difficulty  Sleep Study/ CPAP : Fasting Blood Sugar :      / Checks Blood Sugar -- times a day:   Blood Thinner/ Instructions /Last Dose: ASA / Instructions/ Last Dose :  Eliquis- pt states she has preop instructions to stop 4 days prior to surgery per pt  U/A done 04/25/2020 faxed via epic to [facility].

## 2020-04-26 NOTE — Telephone Encounter (Signed)
Told April Guerra that the Endometrial biopsy was benign. The CA-125 was elevated at 114.  The Inhibin B was WNL at 7.8.  The CA-125 and be indicative of an ovarian cancer,but is not definitive. More will be known at surgery when the mass is removed and sent to pathology per [name],NP Pt verbalized understanding.

## 2020-04-27 ENCOUNTER — Telehealth: Payer: Self-pay | Admitting: Oncology

## 2020-04-27 NOTE — Telephone Encounter (Signed)
Called April Guerra with normal inhibin B results.  She verbalized understanding and agreement.

## 2020-04-30 ENCOUNTER — Other Ambulatory Visit (HOSPITAL_COMMUNITY)
Admission: RE | Admit: 2020-04-30 | Discharge: 2020-04-30 | Disposition: A | Discharge status: Home / Self Care | Payer: Medicare Other | Source: Ambulatory Visit | Attending: Gynecologic Oncology | Admitting: Gynecologic Oncology

## 2020-04-30 DIAGNOSIS — Z20822 Contact with and (suspected) exposure to covid-19: Secondary | ICD-10-CM | POA: Insufficient documentation

## 2020-04-30 DIAGNOSIS — Z01812 Encounter for preprocedural laboratory examination: Secondary | ICD-10-CM | POA: Diagnosis not present

## 2020-04-30 LAB — SARS CORONAVIRUS 2 (TAT 6-24 HRS): SARS Coronavirus 2: NEGATIVE

## 2020-05-02 ENCOUNTER — Other Ambulatory Visit (HOSPITAL_COMMUNITY): Payer: Medicare Other

## 2020-05-02 ENCOUNTER — Telehealth: Payer: Self-pay

## 2020-05-02 ENCOUNTER — Ambulatory Visit (HOSPITAL_COMMUNITY): Payer: Medicare Other

## 2020-05-02 NOTE — Telephone Encounter (Signed)
April Guerra stated that she understands her written pre op  instructions. She also followed the instructions noted below for holding medication and when to resume Eliquis per [name],NP: STOP TAKING YOUR ELIQUIS 4 DAYS BEFORE SURGERY (LAST DOSE ON NOV 6). STOP FISH OIL AND TURMERIC NOW. YOU CAN RESUME YOUR ELIQUIS 1 WEEK AFTER SURGERY (START TAKING May 11, 2020).

## 2020-05-03 ENCOUNTER — Ambulatory Visit (HOSPITAL_COMMUNITY): Payer: Medicare Other | Admitting: Anesthesiology

## 2020-05-03 ENCOUNTER — Telehealth: Payer: Self-pay

## 2020-05-03 ENCOUNTER — Ambulatory Visit (HOSPITAL_COMMUNITY): Payer: Medicare Other | Admitting: Physician Assistant

## 2020-05-03 ENCOUNTER — Ambulatory Visit (HOSPITAL_COMMUNITY)
Admission: RE | Admit: 2020-05-03 | Discharge: 2020-05-03 | Disposition: A | Discharge status: Home / Self Care | Payer: Medicare Other | Attending: Gynecologic Oncology | Admitting: Gynecologic Oncology

## 2020-05-03 ENCOUNTER — Other Ambulatory Visit: Payer: Self-pay

## 2020-05-03 ENCOUNTER — Encounter (HOSPITAL_COMMUNITY)
Admission: RE | Disposition: A | Discharge status: Home / Self Care | Payer: Self-pay | Source: Home / Self Care | Attending: Gynecologic Oncology

## 2020-05-03 ENCOUNTER — Encounter (HOSPITAL_COMMUNITY): Payer: Self-pay | Admitting: Gynecologic Oncology

## 2020-05-03 DIAGNOSIS — R1900 Intra-abdominal and pelvic swelling, mass and lump, unspecified site: Secondary | ICD-10-CM | POA: Diagnosis present

## 2020-05-03 DIAGNOSIS — I10 Essential (primary) hypertension: Secondary | ICD-10-CM | POA: Diagnosis not present

## 2020-05-03 DIAGNOSIS — I4891 Unspecified atrial fibrillation: Secondary | ICD-10-CM | POA: Diagnosis not present

## 2020-05-03 DIAGNOSIS — Z7901 Long term (current) use of anticoagulants: Secondary | ICD-10-CM | POA: Diagnosis not present

## 2020-05-03 DIAGNOSIS — Z6834 Body mass index (BMI) 34.0-34.9, adult: Secondary | ICD-10-CM | POA: Insufficient documentation

## 2020-05-03 DIAGNOSIS — E669 Obesity, unspecified: Secondary | ICD-10-CM | POA: Diagnosis not present

## 2020-05-03 DIAGNOSIS — R971 Elevated cancer antigen 125 [CA 125]: Secondary | ICD-10-CM

## 2020-05-03 DIAGNOSIS — C561 Malignant neoplasm of right ovary: Secondary | ICD-10-CM | POA: Diagnosis not present

## 2020-05-03 DIAGNOSIS — I480 Paroxysmal atrial fibrillation: Secondary | ICD-10-CM | POA: Diagnosis not present

## 2020-05-03 DIAGNOSIS — Z79899 Other long term (current) drug therapy: Secondary | ICD-10-CM | POA: Insufficient documentation

## 2020-05-03 DIAGNOSIS — N950 Postmenopausal bleeding: Secondary | ICD-10-CM

## 2020-05-03 DIAGNOSIS — E7800 Pure hypercholesterolemia, unspecified: Secondary | ICD-10-CM | POA: Diagnosis not present

## 2020-05-03 HISTORY — PX: ROBOTIC ASSISTED TOTAL HYSTERECTOMY WITH BILATERAL SALPINGO OOPHERECTOMY: SHX6086

## 2020-05-03 LAB — TYPE AND SCREEN
ABO/RH(D): O POS
Antibody Screen: NEGATIVE

## 2020-05-03 SURGERY — HYSTERECTOMY, TOTAL, ROBOT-ASSISTED, LAPAROSCOPIC, WITH BILATERAL SALPINGO-OOPHORECTOMY
Anesthesia: General

## 2020-05-03 MED ORDER — CELECOXIB 200 MG PO CAPS
200.0000 mg | ORAL_CAPSULE | Freq: Once | ORAL | Status: AC
  Administered 2020-05-03: 200 mg via ORAL
  Filled 2020-05-03: qty 1

## 2020-05-03 MED ORDER — EPHEDRINE 5 MG/ML INJ
INTRAVENOUS | Status: AC
  Filled 2020-05-03: qty 10

## 2020-05-03 MED ORDER — FENTANYL CITRATE (PF) 250 MCG/5ML IJ SOLN
INTRAMUSCULAR | Status: DC | PRN
  Administered 2020-05-03 (×4): 50 ug via INTRAVENOUS
  Administered 2020-05-03: 100 ug via INTRAVENOUS

## 2020-05-03 MED ORDER — LACTATED RINGERS IR SOLN
Status: DC | PRN
  Administered 2020-05-03: 1000 mL

## 2020-05-03 MED ORDER — PROMETHAZINE HCL 25 MG/ML IJ SOLN: 6.2500 mg | INTRAMUSCULAR | Status: DC | PRN

## 2020-05-03 MED ORDER — ENOXAPARIN SODIUM 40 MG/0.4ML ~~LOC~~ SOLN
40.0000 mg | SUBCUTANEOUS | Status: AC
  Administered 2020-05-03: 40 mg via SUBCUTANEOUS
  Filled 2020-05-03: qty 0.4

## 2020-05-03 MED ORDER — FENTANYL CITRATE (PF) 100 MCG/2ML IJ SOLN
INTRAMUSCULAR | Status: AC
  Filled 2020-05-03: qty 2

## 2020-05-03 MED ORDER — SUGAMMADEX SODIUM 200 MG/2ML IV SOLN
INTRAVENOUS | Status: DC | PRN
  Administered 2020-05-03: 200 mg via INTRAVENOUS

## 2020-05-03 MED ORDER — BUPIVACAINE HCL 0.25 % IJ SOLN
INTRAMUSCULAR | Status: AC
  Filled 2020-05-03: qty 1

## 2020-05-03 MED ORDER — LIDOCAINE HCL 2 % IJ SOLN
INTRAMUSCULAR | Status: AC
  Filled 2020-05-03: qty 20

## 2020-05-03 MED ORDER — ENOXAPARIN (LOVENOX) PATIENT EDUCATION KIT
PACK | Freq: Once | Status: DC
  Filled 2020-05-03: qty 1

## 2020-05-03 MED ORDER — BUPIVACAINE HCL 0.25 % IJ SOLN
INTRAMUSCULAR | Status: DC | PRN
  Administered 2020-05-03: 20 mL

## 2020-05-03 MED ORDER — SCOPOLAMINE 1 MG/3DAYS TD PT72
1.0000 | MEDICATED_PATCH | TRANSDERMAL | Status: DC
  Administered 2020-05-03: 1.5 mg via TRANSDERMAL
  Filled 2020-05-03: qty 1

## 2020-05-03 MED ORDER — EPHEDRINE SULFATE-NACL 50-0.9 MG/10ML-% IV SOSY
PREFILLED_SYRINGE | INTRAVENOUS | Status: DC | PRN
  Administered 2020-05-03: 5 mg via INTRAVENOUS

## 2020-05-03 MED ORDER — DEXAMETHASONE SODIUM PHOSPHATE 4 MG/ML IJ SOLN
4.0000 mg | INTRAMUSCULAR | Status: AC
  Administered 2020-05-03: 4 mg via INTRAVENOUS

## 2020-05-03 MED ORDER — CHLORHEXIDINE GLUCONATE 0.12 % MT SOLN
15.0000 mL | Freq: Once | OROMUCOSAL | Status: AC
  Administered 2020-05-03: 15 mL via OROMUCOSAL

## 2020-05-03 MED ORDER — ROCURONIUM BROMIDE 10 MG/ML (PF) SYRINGE
PREFILLED_SYRINGE | INTRAVENOUS | Status: DC | PRN
  Administered 2020-05-03: 10 mg via INTRAVENOUS
  Administered 2020-05-03: 100 mg via INTRAVENOUS

## 2020-05-03 MED ORDER — ORAL CARE MOUTH RINSE: 15.0000 mL | Freq: Once | OROMUCOSAL | Status: AC

## 2020-05-03 MED ORDER — ONDANSETRON HCL 4 MG/2ML IJ SOLN
INTRAMUSCULAR | Status: DC | PRN
  Administered 2020-05-03: 4 mg via INTRAVENOUS

## 2020-05-03 MED ORDER — SODIUM CHLORIDE 0.9% FLUSH: 3.0000 mL | Freq: Two times a day (BID) | INTRAVENOUS | Status: DC

## 2020-05-03 MED ORDER — ROCURONIUM BROMIDE 10 MG/ML (PF) SYRINGE
PREFILLED_SYRINGE | INTRAVENOUS | Status: AC
  Filled 2020-05-03: qty 10

## 2020-05-03 MED ORDER — ENOXAPARIN SODIUM 40 MG/0.4ML ~~LOC~~ SOLN
40.0000 mg | SUBCUTANEOUS | 0 refills | Status: DC
Start: 2020-05-04 — End: 2020-05-09

## 2020-05-03 MED ORDER — ONDANSETRON HCL 4 MG/2ML IJ SOLN
INTRAMUSCULAR | Status: AC
  Filled 2020-05-03: qty 2

## 2020-05-03 MED ORDER — DEXAMETHASONE SODIUM PHOSPHATE 10 MG/ML IJ SOLN
INTRAMUSCULAR | Status: AC
  Filled 2020-05-03: qty 1

## 2020-05-03 MED ORDER — ENOXAPARIN (LOVENOX) PATIENT EDUCATION KIT: 1.0000 | PACK | Freq: Once | 0 refills | Status: AC

## 2020-05-03 MED ORDER — LIDOCAINE 2% (20 MG/ML) 5 ML SYRINGE
INTRAMUSCULAR | Status: AC
  Filled 2020-05-03: qty 5

## 2020-05-03 MED ORDER — ACETAMINOPHEN 500 MG PO TABS: 1000.0000 mg | ORAL_TABLET | ORAL | Status: DC

## 2020-05-03 MED ORDER — FENTANYL CITRATE (PF) 100 MCG/2ML IJ SOLN
25.0000 ug | INTRAMUSCULAR | Status: DC | PRN
  Administered 2020-05-03: 50 ug via INTRAVENOUS

## 2020-05-03 MED ORDER — LACTATED RINGERS IV SOLN: INTRAVENOUS | Status: DC

## 2020-05-03 MED ORDER — PROPOFOL 10 MG/ML IV BOLUS
INTRAVENOUS | Status: AC
  Filled 2020-05-03: qty 20

## 2020-05-03 MED ORDER — GABAPENTIN 100 MG PO CAPS
100.0000 mg | ORAL_CAPSULE | ORAL | Status: AC
  Administered 2020-05-03: 100 mg via ORAL
  Filled 2020-05-03: qty 1

## 2020-05-03 MED ORDER — PROPOFOL 10 MG/ML IV BOLUS
INTRAVENOUS | Status: DC | PRN
  Administered 2020-05-03: 150 mg via INTRAVENOUS
  Administered 2020-05-03: 30 mg via INTRAVENOUS

## 2020-05-03 MED ORDER — CEFAZOLIN SODIUM-DEXTROSE 2-4 GM/100ML-% IV SOLN
2.0000 g | INTRAVENOUS | Status: AC
  Administered 2020-05-03: 2 g via INTRAVENOUS
  Filled 2020-05-03: qty 100

## 2020-05-03 MED ORDER — ACETAMINOPHEN 500 MG PO TABS
1000.0000 mg | ORAL_TABLET | Freq: Once | ORAL | Status: AC
  Administered 2020-05-03: 1000 mg via ORAL
  Filled 2020-05-03: qty 2

## 2020-05-03 MED ORDER — LIDOCAINE 2% (20 MG/ML) 5 ML SYRINGE
INTRAMUSCULAR | Status: DC | PRN
  Administered 2020-05-03: 40 mg via INTRAVENOUS

## 2020-05-03 MED ORDER — BUPIVACAINE LIPOSOME 1.3 % IJ SUSP
20.0000 mL | Freq: Once | INTRAMUSCULAR | Status: DC
  Filled 2020-05-03: qty 20

## 2020-05-03 MED ORDER — LIDOCAINE 20MG/ML (2%) 15 ML SYRINGE OPTIME
INTRAMUSCULAR | Status: DC | PRN
  Administered 2020-05-03: 1 mg/kg/h via INTRAVENOUS

## 2020-05-03 MED ORDER — ENOXAPARIN SODIUM 40 MG/0.4ML ~~LOC~~ SOLN: 40.0000 mg | SUBCUTANEOUS | 0 refills | Status: DC

## 2020-05-03 SURGICAL SUPPLY — 65 items
APPLICATOR SURGIFLO ENDO (HEMOSTASIS) ×2 IMPLANT
BACTOSHIELD CHG 4% 4OZ (MISCELLANEOUS) ×1
BAG LAPAROSCOPIC 12 15 PORT 16 (BASKET) ×1 IMPLANT
BAG RETRIEVAL 12/15 (BASKET) ×2
BLADE SURG SZ10 CARB STEEL (BLADE) ×2 IMPLANT
COVER BACK TABLE 60X90IN (DRAPES) ×2 IMPLANT
COVER TIP SHEARS 8 DVNC (MISCELLANEOUS) ×1 IMPLANT
COVER TIP SHEARS 8MM DA VINCI (MISCELLANEOUS) ×1
COVER WAND RF STERILE (DRAPES) IMPLANT
DECANTER SPIKE VIAL GLASS SM (MISCELLANEOUS) ×2 IMPLANT
DERMABOND ADVANCED (GAUZE/BANDAGES/DRESSINGS) ×1
DERMABOND ADVANCED .7 DNX12 (GAUZE/BANDAGES/DRESSINGS) ×1 IMPLANT
DRAPE ARM DVNC X/XI (DISPOSABLE) ×4 IMPLANT
DRAPE COLUMN DVNC XI (DISPOSABLE) ×1 IMPLANT
DRAPE DA VINCI XI ARM (DISPOSABLE) ×4
DRAPE DA VINCI XI COLUMN (DISPOSABLE) ×1
DRAPE SHEET LG 3/4 BI-LAMINATE (DRAPES) ×2 IMPLANT
DRAPE SURG IRRIG POUCH 19X23 (DRAPES) ×2 IMPLANT
DRSG OPSITE POSTOP 4X6 (GAUZE/BANDAGES/DRESSINGS) IMPLANT
DRSG OPSITE POSTOP 4X8 (GAUZE/BANDAGES/DRESSINGS) IMPLANT
ELECT PENCIL ROCKER SW 15FT (MISCELLANEOUS) ×2 IMPLANT
ELECT REM PT RETURN 15FT ADLT (MISCELLANEOUS) ×2 IMPLANT
GLOVE BIO SURGEON STRL SZ 6 (GLOVE) ×8 IMPLANT
GLOVE BIO SURGEON STRL SZ 6.5 (GLOVE) ×4 IMPLANT
GOWN STRL REUS W/ TWL LRG LVL3 (GOWN DISPOSABLE) ×5 IMPLANT
GOWN STRL REUS W/TWL LRG LVL3 (GOWN DISPOSABLE) ×5
HOLDER FOLEY CATH W/STRAP (MISCELLANEOUS) IMPLANT
IRRIG SUCT STRYKERFLOW 2 WTIP (MISCELLANEOUS) ×2
IRRIGATION SUCT STRKRFLW 2 WTP (MISCELLANEOUS) ×1 IMPLANT
KIT PROCEDURE DA VINCI SI (MISCELLANEOUS)
KIT PROCEDURE DVNC SI (MISCELLANEOUS) IMPLANT
KIT SIGMOIDOSCOPE (SET/KITS/TRAYS/PACK) ×2 IMPLANT
KIT TURNOVER KIT A (KITS) IMPLANT
MANIPULATOR UTERINE 4.5 ZUMI (MISCELLANEOUS) ×2 IMPLANT
NEEDLE HYPO 22GX1.5 SAFETY (NEEDLE) ×2 IMPLANT
NEEDLE SPNL 18GX3.5 QUINCKE PK (NEEDLE) IMPLANT
OBTURATOR OPTICAL STANDARD 8MM (TROCAR) ×1
OBTURATOR OPTICAL STND 8 DVNC (TROCAR) ×1
OBTURATOR OPTICALSTD 8 DVNC (TROCAR) ×1 IMPLANT
PACK ROBOT GYN CUSTOM WL (TRAY / TRAY PROCEDURE) ×2 IMPLANT
PAD POSITIONING PINK XL (MISCELLANEOUS) ×2 IMPLANT
PORT ACCESS TROCAR AIRSEAL 12 (TROCAR) ×1 IMPLANT
PORT ACCESS TROCAR AIRSEAL 5M (TROCAR) ×1
POUCH SPECIMEN RETRIEVAL 10MM (ENDOMECHANICALS) IMPLANT
SCRUB CHG 4% DYNA-HEX 4OZ (MISCELLANEOUS) ×1 IMPLANT
SEAL CANN UNIV 5-8 DVNC XI (MISCELLANEOUS) ×4 IMPLANT
SEAL XI 5MM-8MM UNIVERSAL (MISCELLANEOUS) ×4
SET TRI-LUMEN FLTR TB AIRSEAL (TUBING) ×2 IMPLANT
SPONGE LAP 18X18 RF (DISPOSABLE) ×2 IMPLANT
SURGIFLO W/THROMBIN 8M KIT (HEMOSTASIS) ×2 IMPLANT
SUT MNCRL AB 4-0 PS2 18 (SUTURE) ×2 IMPLANT
SUT PDS AB 1 TP1 96 (SUTURE) ×2 IMPLANT
SUT VIC AB 0 CT1 27 (SUTURE)
SUT VIC AB 0 CT1 27XBRD ANTBC (SUTURE) IMPLANT
SUT VIC AB 2-0 CT1 27 (SUTURE)
SUT VIC AB 2-0 CT1 TAPERPNT 27 (SUTURE) IMPLANT
SUT VIC AB 4-0 PS2 18 (SUTURE) ×6 IMPLANT
SYR 10ML LL (SYRINGE) IMPLANT
SYR 20ML LL LF (SYRINGE) ×4 IMPLANT
TOWEL OR NON WOVEN STRL DISP B (DISPOSABLE) ×2 IMPLANT
TRAP SPECIMEN MUCUS 40CC (MISCELLANEOUS) ×2 IMPLANT
TROCAR XCEL NON-BLD 5MMX100MML (ENDOMECHANICALS) IMPLANT
UNDERPAD 30X36 HEAVY ABSORB (UNDERPADS AND DIAPERS) ×2 IMPLANT
WATER STERILE IRR 1000ML POUR (IV SOLUTION) ×2 IMPLANT
YANKAUER SUCT BULB TIP 10FT TU (MISCELLANEOUS) ×2 IMPLANT

## 2020-05-03 NOTE — Op Note (Signed)
OPERATIVE NOTE 05/03/20  Surgeon: Donaciano Eva   Assistants: Dr Lahoma Crocker (an MD assistant was necessary for tissue manipulation, management of robotic instrumentation, retraction and positioning due to the complexity of the case and hospital policies).   Anesthesia: General endotracheal anesthesia  ASA Class: 3   Pre-operative Diagnosis: pelvic mass, elevated CA 125.  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 Cervical biopsy  Surgeon: Donaciano Eva  Assistant Surgeon: Lahoma Crocker MD  Anesthesia: GET  Urine Output: 100cc  Operative Findings:  : 1 cm friable lesion on the posterior ectocervix which was benign on frozen section.  Grossly normal-appearing 7 cm uterus and grossly normal-appearing left tube and ovary though these were densely adherent to the posterior cul-de-sac in the midline.  The right ovary was replaced by a 15 cm cystic and solid mass.  The mass was solid at its inferior aspect and densely adherent and infiltrated into the right uterosacral ligament and right parametrial tissues.  The rectum was adherent to the posterior uterus and cervix in the midline.  There was no gross upper abdominal disease.  There was trace ascites that was amber in color.  Diaphragms were smooth.  No gross abnormalities to small intestines.  There were some tics present in the sigmoid colon.  No gross residual tumor remaining at the completion of the procedure representing an R0 resection.  Estimated Blood Loss:  100cc      Total IV Fluids: 800 ml         Specimens: cervical biopsy, uterus with cervix and bilateral tubes and ovaries, omentum, right abdominal peritoneum, left abdominal peritoneum         Complications:  None; April Guerra tolerated the procedure well.          Disposition: PACU - hemodynamically stable.  Procedure Details  The April Guerra was seen in the Holding Room. The risks, benefits, complications, treatment options, and expected outcomes were discussed with the April Guerra.  The April Guerra concurred with the proposed plan, giving informed consent.  The site of surgery properly noted/marked. The April Guerra was identified as April Guerra and the procedure verified as a Robotic-assisted hysterectomy with bilateral salpingo oophorectomy. A Time Out was held and the above information confirmed.  After induction of anesthesia, the April Guerra was draped and prepped in the usual sterile manner. Pt was placed in supine position after anesthesia and draped and prepped in the usual sterile manner. The abdominal drape was placed after the CholoraPrep had been allowed to dry for 3 minutes.  Her arms were tucked to her side with all appropriate precautions.  The shoulders were stabilized with padded shoulder blocks applied to the acromium processes.  The April Guerra was placed in the semi-lithotomy position in [place].  The perineum was prepped with Betadine. The April Guerra was then prepped. Foley catheter was placed.  A sterile speculum was placed in the vagina.  The cervix was grasped with a single-tooth tenaculum and dilated with Kennon Rounds dilators.  The ZUMI uterine manipulator with a medium colpotomizer ring was placed without difficulty.  A pneum occluder balloon was placed over the manipulator.  OG tube placement was confirmed and to suction.   Next, a 5 mm skin incision was made 1 cm below the subcostal margin in the midclavicular line.  The 5 mm Optiview port and scope was used for direct entry.  Opening pressure was under 10 mm CO2.  The abdomen was insufflated and the findings were noted as above.   At this point and all points during the procedure, the April Guerra's intra-abdominal pressure did not exceed 15 mmHg. Next, a 10 mm skin incision was made above the umbilicus and a right and  left port was placed about 10 cm lateral to the robot port on the right and left side.  A fourth arm was placed in the left lower quadrant 2 cm above and superior and medial to the anterior superior iliac spine.  All ports were placed under direct visualization.  The April Guerra was placed in steep Trendelenburg.  Bowel was folded away into the upper abdomen.  The robot was docked in the normal manner.  The omentum was carefully dissected from its adhesion to the undersurface of her prior umbilical incision where she had had a cholecystectomy.  The right round ligament was transected and the right retroperitoneal space was entered by making incision along the peritoneum parallel to the right ovarian vessels.  The right ureter was identified in the right retroperitoneal space and the pararectal space was developed with gentle blunt dissection.  The right ovarian vessels were skeletonized above the ureter and bipolar fulgurated and transected.  The right ovary was then elevated.  At that point it became apparent that the solid component of the ovarian mass was densely adherent to the rectum, posterior uterus, right uterosacral, right ovarian fossa, and infiltrating into the right parametrial tissues.  The right ureter was skeletonized from its attachments to the right broad ligament and was mobilized 360 degrees which facilitated movement and dissection of the ureter off of the retroperitoneal fibrosis secondary to the tumor infiltration.  The robotic scissors and bipolar were used to dissect and transect the infiltrative tissues in the retroperitoneum.  The right uterine artery was fulgurated and transected at its origin after skeletonization from the nearby ureter.  The anterior peritoneum was dissected from the uterus and the bladder flap was taken down to level below the [facility] colpotomizer ring.  This facilitated skeletonization of the uterine vessels at the level of the isthmus on the right which were then  transected and the cardinal ligaments on the right were transected.  The midline dissection took place posteriorly to mobilize the rectum from its attachments to the tumor and to the posterior uterus and cervix posteriorly.  The rectovaginal septum was carefully developed with blunt dissection.  Care was made to avoid entry into the rectum.   The left-sided dissection took place by opening the left round ligament and the retroperitoneum on the left was opened parallel to the left ovarian vessels.  These vessels were skeletonized above the ureter which was carefully identified in the left pararectal space.  The left ovarian vessels were then bipolar fulgurated and transected.  The left broad ligament was dissected posteriorly towards the prior plane of dissection.  There was some attachment of the left ovary to the posterior uterus and left uterosacral ligament which required some dissection and mobilization of the left ureter from its adhesions and fibrosis investing it close in approximation to the dissection plane.  The left ureter was free from injury or involvement in the dissection.  The anterior dissection on the left was completed which skeletonized the left uterine vessels at the level of the uterine isthmus.  These were then fulgurated with bipolar energy and transected and the left cardinal ligaments were transected.  The dissection in the right parametrium then took place with care to dissected the pararectal  fat and assured hemostasis with bipolar surgical device.  The ureter on the right was mobilized laterally during the dissection.  The right side of the vagina was tubularized free of the parametrial fat which facilitated performing a colpotomy with the monopolar device.  The uterine specimen with its attached bilateral tubes and ovaries were removed through the vagina.  During the dissection of the right posterior tumor there was unavoidable rupture of the cystic component of the mass due to the  densely infiltrative nature of the tumor.  There was no gross residual tumor left in the pelvis at the completion of the dissection.  There were no suspicious lymph nodes in the retroperitoneum.  Specimen was sent for frozen section.  The endometrium was benign.  The right tube and ovary revealed a high-grade serous carcinoma.  Given that the tumor was at least a stage II, lymphadenectomy was considered futile, therefore an omentectomy and abdominal peritoneal biopsies were performed in order to assure that there was no upper abdominal involvement and stage III disease.  The omentectomy was then performed. The omentum was delivered into the mid abdomen and elevated. The parietal peritonum that attaches the omentum to the transverse colon was incised facilitating separation of the mid portion of the omentum from the transverse colon. The right sided omentum with its vascular pedicles was then skeletonized in its attachments to the right transverse colon. These vascular pedicles were bipolar sealed and transected. Observation of the colonic wall occurred throughout. The dissection then moved progressively along the colon the the left splenic flexure of the transverse colon. The bipolar sealing forceps and the scissors were used to skeletonize vascular omental pedicles, seal them and transect them until the entire infracolic omentum had been freed from its transverse colonic attachments to the splenic flexure. The omentum was then placed in an endocatch bag and retrieved from the left upper quadrant port at the completion of the procedure.  The left and right paracolic gutters were inspected and a small piece (1 cm) of representative peritoneum was taken sharply with the robotic scissors as represented biopsies of the peritoneum.  The pelvic dissection was carefully inspected including the anterior rectal wall and no breach of the rectum had been visualized.  A rigid proctoscope took scope was inserted into  the rectum and the rectum was insufflated with gas after crossclamping the sigmoid colon at the pelvic brim with one of the robotic instruments.  Dr. Johney Maine performed direct visualization of the rectal mucosa and the bubble test was performed under saline irrigation with no evidence of breach of the rectal wall.  Pedicles were inspected and excellent hemostasis was achieved.    The colpotomy at the vaginal cuff was closed with Vicryl on a CT1 needle in a running manner.  Irrigation was used and excellent hemostasis was achieved.  At this point in the procedure was completed.  Robotic instruments were removed under direct visulaization.  The robot was undocked. The 10 mm ports were closed with Vicryl on a UR-5 needle and the fascia was closed with 0 Vicryl on a UR-5 needle.  The skin was closed with 4-0 Vicryl in a subcuticular manner.  Dermabond was applied.  Sponge, lap and needle counts correct x 2.  The April Guerra was taken to the recovery room in stable condition.  The vagina was swabbed with  minimal bleeding noted.   All instrument and needle counts were correct x  3.   The April Guerra was transferred to the recovery room in stable  condition.  Donaciano Eva, MD

## 2020-05-03 NOTE — Anesthesia Postprocedure Evaluation (Signed)
Anesthesia Post Note  Patient: NADINE RYLE  Procedure(s) Performed: XI ROBOTIC ASSISTED TOTAL HYSTERECTOMY WITH BILATERAL SALPINGO OOPHORECTOMY, OMENTECTOMY,RADICAL TUMOR DEBULKING, RIGID PROSTOSCOPY (N/A )     Patient location during evaluation: PACU Anesthesia Type: General Level of consciousness: sedated Pain management: pain level controlled Vital Signs Assessment: post-procedure vital signs reviewed and stable Respiratory status: spontaneous breathing and respiratory function stable Cardiovascular status: stable Postop Assessment: no apparent nausea or vomiting Anesthetic complications: no   No complications documented.  Last Vitals:  Vitals:   05/03/20 1700 05/03/20 1715  BP: (!) 157/73 (!) 156/80  Pulse: 65 68  Resp: 16   Temp: (!) 36.3 C   SpO2: 98% 97%    Last Pain:  Vitals:   05/03/20 1700  TempSrc:   PainSc: 4                  Candace Begue DANIEL

## 2020-05-03 NOTE — Discharge Instructions (Signed)
Return to work: 4 weeks (2 weeks with physical restrictions).  YOU WILL NEED TO STAY OFF OF YOUR ELIQUIS FOR ONE WEEK AFTER SURGERY. STARTING TOMORROW, YOU WILL NEED TO ADMINISTER LOVENOX 40 MG INJECTION IN YOUR ABDOMEN IN THE REGION OF THE LOVE HANDLES ONCE A DAY FOR 7 DAYS. AFTER 7 DAYS FROM SURGERY, YOU WILL NEED TO RESUME YOUR ELIQUIS AND YOU SHOULD TAKE NO MORE LOVENOX INJECTIONS. YOU WILL NEED TO ADMINISTER THE INJECTION AT THE SAME TIME EACH DAY AND YOU WILL ALTERNATE SIDES OF YOUR ABDOMEN (LEFT ON ONE DAY, THEN RIGHT ON THE NEXT).  Activity: 1. Be up and out of the bed during the day.  Take a nap if needed.  You may walk up steps but be careful and use the hand rail.  Stair climbing will tire you more than you think, you may need to stop part way and rest.   2. No lifting or straining for 4 weeks.  3. No driving for 1 weeks.  Do Not drive if you are taking narcotic pain medicine.  4. Shower daily.  Use soap and water on your incision and pat dry; don't rub.   5. No sexual activity and nothing in the vagina for 8 weeks.  Medications:  -Take Lovenox subcutaneously once daily for 7 days.  See above for specific instructions.  - Take ibuprofen and tylenol first line for pain control. Take these regularly (every 6 hours) to decrease the build up of pain.  - If necessary, for severe pain not relieved by ibuprofen, contact Dr Serita Grit office and you will be prescribed oxycodone.  - While taking oxycodone, you should take sennakot every night to reduce the likelihood of constipation. If this causes diarrhea, stop its use.  Diet: 1. Low sodium Heart Healthy Diet is recommended.  2. It is safe to use a laxative if you have difficulty moving your bowels.   Wound Care: 1. Keep clean and dry.  Shower daily.  Reasons to call the Doctor:   Fever - Oral temperature greater than 100.4 degrees Fahrenheit  Foul-smelling vaginal discharge  Difficulty urinating  Nausea and  vomiting  Increased pain at the site of the incision that is unrelieved with pain medicine.  Difficulty breathing with or without chest pain  New calf pain especially if only on one side  Sudden, continuing increased vaginal bleeding with or without clots.   Follow-up: 1. See Everitt Amber in 4 weeks.  Contacts: For questions or concerns you should contact:  Dr. Everitt Amber at [phone] After hours and on week-ends call [phone] and ask to speak to the physician on call for Gynecologic Oncology  After Your Surgery  The information in this section will tell you what to expect after your surgery, both during your stay and after you leave. You will learn how to safely recover from your surgery. Write down any questions you have and be sure to ask your doctor or nurse.  What to Expect When you wake up after your surgery, you will be in the [facility] (PACU) or your recovery room. A nurse will be monitoring your body temperature, blood pressure, pulse, and oxygen levels. You may have a urinary catheter in your bladder to help monitor the amount of urine you are making. It should come out before you go home. You will also have compression boots on your lower legs to help your circulation. Your pain medication will be given through an IV line or in tablet form.  If you are having pain, tell your nurse. Your nurse will tell you how to recover from your surgery. Below are examples of ways you can help yourself recover safely. . You will be encouraged to walk with the help of your nurse or physical therapist. We will give you medication to relieve pain. Walking helps reduce the risk for blood clots and pneumonia. It also helps to stimulate your bowels so they begin working again. . Use your incentive spirometer. This will help your lungs expand, which prevents pneumonia.   Commonly Asked Questions  Will I have pain after surgery? Yes, you will have some pain after your  surgery, especially in the first few days. Your doctor and nurse will ask you about your pain often. You will be given medication to manage your pain as needed. If your pain is not relieved, please tell your doctor or nurse. It is important to control your pain so you can cough, breathe deeply, use your incentive spirometer, and get out of bed and walk.  Will I be able to eat? Yes, you will be able to eat a regular diet or eat as tolerated. You should start with foods that are soft and easy to digest such as apple sauce and chicken noodle soup. Eat small meals frequently, and then advance to regular foods. If you experience bloating, gas, or cramps, limit high-fiber foods, including whole grain breads and cereal, nuts, seeds, salads, fresh fruit, broccoli, cabbage, and cauliflower. Will I have pain when I am home? The length of time each person has pain or discomfort varies. You may still have some pain when you go home and will probably be taking pain medication. Follow the guidelines below. . Take your medications as directed and as needed. . Call your doctor if the medication prescribed for you doesn't relieve your pain. . Don't drive or drink alcohol while you're taking prescription pain medication. . As your incision heals, you will have less pain and need less pain medication. A mild pain reliever such as acetaminophen (Tylenol) or ibuprofen (Advil) will relieve aches and discomfort. However, large quantities of acetaminophen may be harmful to your liver. Don't take more acetaminophen than the amount directed on the bottle or as instructed by your doctor or nurse. . Pain medication should help you as you resume your normal activities. Take enough medication to do your exercises comfortably. Pain medication is most effective 30 to 45 minutes after taking it. Marland Kitchen Keep track of when you take your pain medication. Taking it when your pain first begins is more effective than waiting for the pain to get  worse. Pain medication may cause constipation (having fewer bowel movements than what is normal for you).  How can I prevent constipation? . Go to the bathroom at the same time every day. Your body will get used to going at that time. . If you feel the urge to go, don't put it off. Try to use the bathroom 5 to 15 minutes after meals. . After breakfast is a good time to move your bowels. The reflexes in your colon are strongest at this time. . Exercise, if you can. Walking is an excellent form of exercise. . Drink 8 (8-ounce) glasses (2 liters) of liquids daily, if you can. Drink water, juices, soups, ice cream shakes, and other drinks that don't have caffeine. Drinks with caffeine, such as coffee and soda, pull fluid out of the body. . Slowly increase the fiber in your diet to 25 to 35  grams per day. Fruits, vegetables, whole grains, and cereals contain fiber. If you have an ostomy or have had recent bowel surgery, check with your doctor or nurse before making any changes in your diet. . Both over-the-counter and prescription medications are available to treat constipation. Start with 1 of the following over-the-counter medications first: o Docusate sodium (Colace) 100 mg. Take ___1__ capsules _2____ times a day. This is a stool softener that causes few side effects. Don't take it with mineral oil. o Polyethylene glycol (MiraLAX) 17 grams daily. o Senna (Senokot) 2 tablets at bedtime. This is a stimulant laxative, which can cause cramping. . If you haven't had a bowel movement in 2 days, call your doctor or nurse.  Can I shower? Yes, you should shower 24 hours after your surgery. Be sure to shower every day. Taking a warm shower is relaxing and can help decrease muscle aches. Use soap when you shower and gently wash your incision. Pat the areas dry with a towel after showering, and leave your incision uncovered (unless there is drainage). Call your doctor if you see any redness or drainage from  your incision. Don't take tub baths until you discuss it with your doctor at the first appointment after your surgery. How do I care for my incisions? You will have several small incisions on your abdomen. The incisions are closed with Steri-Strips or Dermabond. You may also have square white dressings on your incisions (Primapore). You can remove these in the shower 24 hours after your surgery. You should clean your incisions with soap and water. If you go home with Steri-Strips on your incision, they will loosen and may fall off by themselves. If they haven't fallen off within 10 days, you can remove them. If you go home with Dermabond over your sutures (stitches), it will also loosen and peel off.  What are the most common symptoms after a hysterectomy? It's common for you to have some vaginal spotting or light bleeding. You should monitor this with a pad or a panty liner. If you have having heavy bleeding (bleeding through a pad or liner every 1 to 2 hours), call your doctor right away. It's also common to have some discomfort after surgery from the air that was pumped into your abdomen during surgery. To help with this, walk, drink plenty of liquids and make sure to take the stool softeners you received.  When is it safe for me to drive? You may resume driving 2 weeks after surgery, as long as you are not taking pain medication that may make you drowsy.  When can I resume sexual activity? Do not place anything in your vagina or have vaginal intercourse for 8 weeks after your surgery. Some people will need to wait longer than 8 weeks, so speak with your doctor before resuming sexual intercourse.  Will I be able to travel? Yes, you can travel. If you are traveling by plane within a few weeks after your surgery, make sure you get up and walk every hour. Be sure to stretch your legs, drink plenty of liquids, and keep your feet elevated when possible.  Will I need any supplies? Most people do  not need any supplies after the surgery. In the rare case that you do need supplies, such as tubes or drains, your nurse will order them for you.  When can I return to work? The time it takes to return to work depends on the type of work you do, the type of surgery  you had, and how fast your body heals. Most people can return to work about 2 to 4 weeks after the surgery.  What exercises can I do? Exercise will help you gain strength and feel better. Walking and stair climbing are excellent forms of exercise. Gradually increase the distance you walk. Climb stairs slowly, resting or stopping as needed. Ask your doctor or nurse before starting more strenuous exercises.  When can I lift heavy objects? Most people should not lift anything heavier than 10 pounds (4.5 kilograms) for at least 4 weeks after surgery. Speak with your doctor about when you can do heavy lifting.  How can I cope with my feelings? After surgery for a serious illness, you may have new and upsetting feelings. Many people say they felt weepy, sad, worried, nervous, irritable, and angry at one time or another. You may find that you can't control some of these feelings. If this happens, it's a good idea to seek emotional support. The first step in coping is to talk about how you feel. Family and friends can help. Your nurse, doctor, and social worker can reassure, support, and guide you. It's always a good idea to let these professionals know how you, your family, and your friends are feeling emotionally. Many resources are available to patients and their families. Whether you're in the hospital or at home, the nurses, doctors, and social workers are here to help you and your family and friends handle the emotional aspects of your illness.  When is my first appointment after surgery? Your first appointment after surgery will be 2 to 4 weeks after surgery. Your nurse will give you instructions on how to make this appointment, including  the phone number to call.  What if I have other questions? If you have any questions or concerns, please talk with your doctor or nurse. You can reach them Monday through Friday from 9:00 am to 5:00 pm. After 5:00 pm, during the weekend, and on holidays, call [phone] and ask for the doctor on call for your doctor.  . Have a temperature of 101 F (38.3 C) or higher . Have pain that does not get better with pain medication . Have redness, drainage, or swelling from your incisions  Enoxaparin injection What is this medicine? ENOXAPARIN (ee nox a PA rin) is used after knee, hip, or abdominal surgeries to prevent blood clotting. It is also used to treat existing blood clots in the lungs or in the veins. This medicine may be used for other purposes; ask your health care provider or pharmacist if you have questions. COMMON BRAND NAME(S): Lovenox What should I tell my health care provider before I take this medicine? They need to know if you have any of these conditions:  bleeding disorders, hemorrhage, or hemophilia  infection of the heart or heart valves  kidney or liver disease  previous stroke  prosthetic heart valve  recent surgery or delivery of a baby  ulcer in the stomach or intestine, diverticulitis, or other bowel disease  an unusual or allergic reaction to enoxaparin, heparin, pork or pork products, other medicines, foods, dyes, or preservatives  pregnant or trying to get pregnant  breast-feeding How should I use this medicine? This medicine is for injection under the skin. It is usually given by a health-care professional. You or a family member may be trained on how to give the injections. If you are to give yourself injections, make sure you understand how to use the syringe, measure the dose  if necessary, and give the injection. To avoid bruising, do not rub the site where this medicine has been injected. Do not take your medicine more often than directed. Do not  stop taking except on the advice of your doctor or health care professional. Make sure you receive a puncture-resistant container to dispose of the needles and syringes once you have finished with them. Do not reuse these items. Return the container to your doctor or health care professional for proper disposal. Talk to your pediatrician regarding the use of this medicine in children. Special care may be needed. Overdosage: If you think you have taken too much of this medicine contact a poison control center or emergency room at once. NOTE: This medicine is only for you. Do not share this medicine with others. What if I miss a dose? If you miss a dose, take it as soon as you can. If it is almost time for your next dose, take only that dose. Do not take double or extra doses. What may interact with this medicine?  aspirin and aspirin-like medicines  certain medicines that treat or prevent blood clots  dipyridamole  NSAIDs, medicines for pain and inflammation, like ibuprofen or naproxen This list may not describe all possible interactions. Give your health care provider a list of all the medicines, herbs, non-prescription drugs, or dietary supplements you use. Also tell them if you smoke, drink alcohol, or use illegal drugs. Some items may interact with your medicine. What should I watch for while using this medicine? Visit your healthcare professional for regular checks on your progress. You may need blood work done while you are taking this medicine. Your condition will be monitored carefully while you are receiving this medicine. It is important not to miss any appointments. If you are going to need surgery or other procedure, tell your healthcare professional that you are using this medicine. Using this medicine for a long time may weaken your bones and increase the risk of bone fractures. Avoid sports and activities that might cause injury while you are using this medicine. Severe falls or  injuries can cause unseen bleeding. Be careful when using sharp tools or knives. Consider using an Copy. Take special care brushing or flossing your teeth. Report any injuries, bruising, or red spots on the skin to your healthcare professional. Wear a medical ID bracelet or chain. Carry a card that describes your disease and details of your medicine and dosage times. What side effects may I notice from receiving this medicine? Side effects that you should report to your doctor or health care professional as soon as possible:  allergic reactions like skin rash, itching or hives, swelling of the face, lips, or tongue  bone pain  signs and symptoms of bleeding such as bloody or black, tarry stools; red or dark-brown urine; spitting up blood or brown material that looks like coffee grounds; red spots on the skin; unusual bruising or bleeding from the eye, gums, or nose  signs and symptoms of a blood clot such as chest pain; shortness of breath; pain, swelling, or warmth in the leg  signs and symptoms of a stroke such as changes in vision; confusion; trouble speaking or understanding; severe headaches; sudden numbness or weakness of the face, arm or leg; trouble walking; dizziness; loss of coordination Side effects that usually do not require medical attention (report to your doctor or health care professional if they continue or are bothersome):  hair loss  pain, redness, or irritation at  site where injected This list may not describe all possible side effects. Call your doctor for medical advice about side effects. You may report side effects to FDA at 1-800-FDA-1088. Where should I keep my medicine? Keep out of the reach of children. Store at room temperature between 15 and 30 degrees C (59 and 86 degrees F). Do not freeze. If your injections have been specially prepared, you may need to store them in the refrigerator. Ask your pharmacist. Throw away any unused medicine after the  expiration date. NOTE: This sheet is a summary. It may not cover all possible information. If you have questions about this medicine, talk to your doctor, pharmacist, or health care provider.  2020 Elsevier/Gold Standard (2017-06-04 11:25:34)

## 2020-05-03 NOTE — Telephone Encounter (Signed)
TC to [facility]. Order for Lovenox 40mg /0.10ml injection changed from 14 day supply to 7 day supply. Order given to Waverly Ferrari, pharmacist.

## 2020-05-03 NOTE — Interval H&P Note (Signed)
History and Physical Interval Note:  05/03/2020 12:16 PM  April Guerra  has presented today for surgery, with the diagnosis of POST MENOPAUSAL BLEEDING AND OVARIAN MASS.  The various methods of treatment have been discussed with the patient and family. After consideration of risks, benefits and other options for treatment, the patient has consented to  Procedure(s): XI ROBOTIC ASSISTED TOTAL HYSTERECTOMY WITH BILATERAL SALPINGO OOPHORECTOMY, AND MINI LAPAROTOMY POSSIBLE STAGING (N/A) as a surgical intervention.  The patient's history has been reviewed, patient examined, no change in status, stable for surgery.  I have reviewed the patient's chart and labs. Her CA 125 was mildly elevated. Questions were answered to the patient's satisfaction.     Thereasa Solo

## 2020-05-03 NOTE — Anesthesia Preprocedure Evaluation (Addendum)
Anesthesia Evaluation  Patient identified by MRN, date of birth, ID band Patient awake    Reviewed: Allergy & Precautions, NPO status , Patient's Chart, lab work & pertinent test results, reviewed documented beta blocker date and time   History of Anesthesia Complications Negative for: history of anesthetic complications  Airway Mallampati: II  TM Distance: >3 FB Neck ROM: Full    Dental no notable dental hx. (+) Dental Advisory Given   Pulmonary asthma ,    Pulmonary exam normal        Cardiovascular hypertension, Pt. on medications and Pt. on home beta blockers Normal cardiovascular exam+ dysrhythmias Atrial Fibrillation      Neuro/Psych negative neurological ROS     GI/Hepatic negative GI ROS, Neg liver ROS,   Endo/Other  negative endocrine ROS  Renal/GU negative Renal ROS     Musculoskeletal negative musculoskeletal ROS (+)   Abdominal   Peds  Hematology negative hematology ROS (+)   Anesthesia Other Findings   Reproductive/Obstetrics                            Anesthesia Physical Anesthesia Plan  ASA: III  Anesthesia Plan: General   Post-op Pain Management:    Induction: Intravenous  PONV Risk Score and Plan: 4 or greater and Ondansetron, Dexamethasone and Diphenhydramine  Airway Management Planned: Oral ETT  Additional Equipment:   Intra-op Plan:   Post-operative Plan: Extubation in OR  Informed Consent: I have reviewed the patients History and Physical, chart, labs and discussed the procedure including the risks, benefits and alternatives for the proposed anesthesia with the patient or authorized representative who has indicated his/her understanding and acceptance.     Dental advisory given  Plan Discussed with: Anesthesiologist and CRNA  Anesthesia Plan Comments:        Anesthesia Quick Evaluation

## 2020-05-03 NOTE — Transfer of Care (Signed)
Immediate Anesthesia Transfer of Care Note  Patient: April Guerra  Procedure(s) Performed: XI ROBOTIC ASSISTED TOTAL HYSTERECTOMY WITH BILATERAL SALPINGO OOPHORECTOMY, OMENTECTOMY,RADICAL TUMOR DEBULKING, RIGID PROSTOSCOPY (N/A )  Patient Location: PACU  Anesthesia Type:General  Level of Consciousness: drowsy  Airway & Oxygen Therapy: Patient Spontanous Breathing and Patient connected to face mask oxygen  Post-op Assessment: Report given to RN and Post -op Vital signs reviewed and stable  Post vital signs: Reviewed and stable  Last Vitals:  Vitals Value Taken Time  BP 183/88 05/03/20 1546  Temp    Pulse 65 05/03/20 1548  Resp 16 05/03/20 1548  SpO2 99 % 05/03/20 1548  Vitals shown include unvalidated device data.  Last Pain:  Vitals:   05/03/20 1107  TempSrc:   PainSc: 0-No pain      Patients Stated Pain Goal: 3 (50/51/07 1252)  Complications: No complications documented.

## 2020-05-03 NOTE — Anesthesia Procedure Notes (Signed)
Procedure Name: Intubation Date/Time: 05/03/2020 12:51 PM Performed by: Eben Burow, CRNA Pre-anesthesia Checklist: Patient identified, Emergency Drugs available, Suction available, Patient being monitored and Timeout performed Patient Re-evaluated:Patient Re-evaluated prior to induction Oxygen Delivery Method: Circle system utilized Preoxygenation: Pre-oxygenation with 100% oxygen Induction Type: IV induction Ventilation: Mask ventilation without difficulty Laryngoscope Size: Mac and 4 Grade View: Grade II Tube type: Oral Tube size: 7.0 mm Number of attempts: 1 Airway Equipment and Method: Stylet Placement Confirmation: ETT inserted through vocal cords under direct vision,  positive ETCO2 and breath sounds checked- equal and bilateral Secured at: 22 cm Tube secured with: Tape Dental Injury: Teeth and Oropharynx as per pre-operative assessment

## 2020-05-04 ENCOUNTER — Encounter (HOSPITAL_COMMUNITY): Payer: Self-pay | Admitting: Gynecologic Oncology

## 2020-05-04 ENCOUNTER — Telehealth: Payer: Self-pay

## 2020-05-04 NOTE — Telephone Encounter (Signed)
April Guerra states that she is eating,drinking, and urinating well. Passing gas.Using colace for bowels. Instructed her to use bid until she has a good BM. Afebrile. Incisions: The LUQ incision is oozing a little blood.  Pt applying a pressure dressing to the are. The other incisions ar D&I She is using tylenol for pain.  Has not needed the Oxycodone. She is holding her Eliquis for one week and will begin the Lovenox 40 mg today daily for 7 days and then resume the Eliquis the 05-11-20 as instructed. Pt aware of post op appointments as well as the office number [phone] and after hours number [phone] to call if she has any questions or concerns

## 2020-05-08 ENCOUNTER — Encounter (HOSPITAL_COMMUNITY)
Admission: EM | Disposition: A | Discharge status: Home / Self Care | Payer: Self-pay | Source: Home / Self Care | Attending: Emergency Medicine

## 2020-05-08 ENCOUNTER — Other Ambulatory Visit: Payer: Self-pay

## 2020-05-08 ENCOUNTER — Observation Stay (HOSPITAL_COMMUNITY)
Admission: EM | Admit: 2020-05-08 | Discharge: 2020-05-09 | Disposition: A | Discharge status: Home / Self Care | Payer: Medicare Other | Attending: Family Medicine | Admitting: Family Medicine

## 2020-05-08 ENCOUNTER — Inpatient Hospital Stay (HOSPITAL_COMMUNITY): Payer: Medicare Other | Admitting: Anesthesiology

## 2020-05-08 ENCOUNTER — Encounter (HOSPITAL_COMMUNITY): Payer: Self-pay

## 2020-05-08 DIAGNOSIS — D649 Anemia, unspecified: Secondary | ICD-10-CM | POA: Diagnosis not present

## 2020-05-08 DIAGNOSIS — K259 Gastric ulcer, unspecified as acute or chronic, without hemorrhage or perforation: Secondary | ICD-10-CM | POA: Diagnosis not present

## 2020-05-08 DIAGNOSIS — C561 Malignant neoplasm of right ovary: Secondary | ICD-10-CM | POA: Diagnosis present

## 2020-05-08 DIAGNOSIS — K921 Melena: Secondary | ICD-10-CM | POA: Diagnosis not present

## 2020-05-08 DIAGNOSIS — R110 Nausea: Secondary | ICD-10-CM | POA: Diagnosis not present

## 2020-05-08 DIAGNOSIS — K2950 Unspecified chronic gastritis without bleeding: Secondary | ICD-10-CM | POA: Diagnosis not present

## 2020-05-08 DIAGNOSIS — K920 Hematemesis: Secondary | ICD-10-CM | POA: Diagnosis present

## 2020-05-08 DIAGNOSIS — I10 Essential (primary) hypertension: Secondary | ICD-10-CM | POA: Diagnosis not present

## 2020-05-08 DIAGNOSIS — R971 Elevated cancer antigen 125 [CA 125]: Secondary | ICD-10-CM | POA: Diagnosis present

## 2020-05-08 DIAGNOSIS — Z79899 Other long term (current) drug therapy: Secondary | ICD-10-CM | POA: Insufficient documentation

## 2020-05-08 DIAGNOSIS — K922 Gastrointestinal hemorrhage, unspecified: Secondary | ICD-10-CM | POA: Diagnosis not present

## 2020-05-08 DIAGNOSIS — K254 Chronic or unspecified gastric ulcer with hemorrhage: Secondary | ICD-10-CM | POA: Diagnosis not present

## 2020-05-08 DIAGNOSIS — R6889 Other general symptoms and signs: Secondary | ICD-10-CM | POA: Diagnosis not present

## 2020-05-08 DIAGNOSIS — Z7901 Long term (current) use of anticoagulants: Secondary | ICD-10-CM

## 2020-05-08 DIAGNOSIS — K625 Hemorrhage of anus and rectum: Principal | ICD-10-CM | POA: Insufficient documentation

## 2020-05-08 DIAGNOSIS — I724 Aneurysm of artery of lower extremity: Secondary | ICD-10-CM | POA: Diagnosis present

## 2020-05-08 DIAGNOSIS — J45909 Unspecified asthma, uncomplicated: Secondary | ICD-10-CM | POA: Diagnosis not present

## 2020-05-08 DIAGNOSIS — Z20822 Contact with and (suspected) exposure to covid-19: Secondary | ICD-10-CM | POA: Insufficient documentation

## 2020-05-08 DIAGNOSIS — E7800 Pure hypercholesterolemia, unspecified: Secondary | ICD-10-CM | POA: Diagnosis present

## 2020-05-08 DIAGNOSIS — Z743 Need for continuous supervision: Secondary | ICD-10-CM | POA: Diagnosis not present

## 2020-05-08 DIAGNOSIS — R1111 Vomiting without nausea: Secondary | ICD-10-CM | POA: Diagnosis not present

## 2020-05-08 DIAGNOSIS — I4891 Unspecified atrial fibrillation: Secondary | ICD-10-CM | POA: Diagnosis not present

## 2020-05-08 DIAGNOSIS — R58 Hemorrhage, not elsewhere classified: Secondary | ICD-10-CM | POA: Diagnosis not present

## 2020-05-08 DIAGNOSIS — K3189 Other diseases of stomach and duodenum: Secondary | ICD-10-CM | POA: Diagnosis not present

## 2020-05-08 HISTORY — PX: BIOPSY: SHX5522

## 2020-05-08 HISTORY — PX: ESOPHAGOGASTRODUODENOSCOPY (EGD) WITH PROPOFOL: SHX5813

## 2020-05-08 LAB — TYPE AND SCREEN
ABO/RH(D): O POS
Antibody Screen: NEGATIVE

## 2020-05-08 LAB — COMPREHENSIVE METABOLIC PANEL
ALT: 62 U/L — ABNORMAL HIGH (ref 0–44)
AST: 62 U/L — ABNORMAL HIGH (ref 15–41)
Albumin: 3.8 g/dL (ref 3.5–5.0)
Alkaline Phosphatase: 46 U/L (ref 38–126)
Anion gap: 8 (ref 5–15)
BUN: 38 mg/dL — ABNORMAL HIGH (ref 8–23)
CO2: 23 mmol/L (ref 22–32)
Calcium: 8.9 mg/dL (ref 8.9–10.3)
Chloride: 100 mmol/L (ref 98–111)
Creatinine, Ser: 0.52 mg/dL (ref 0.44–1.00)
GFR, Estimated: >60 mL/min (ref >60)
Glucose, Bld: 108 mg/dL — ABNORMAL HIGH (ref 70–99)
Potassium: 4.4 mmol/L (ref 3.5–5.1)
Sodium: 131 mmol/L — ABNORMAL LOW (ref 135–145)
Total Bilirubin: 0.8 mg/dL (ref 0.3–1.2)
Total Protein: 6.8 g/dL (ref 6.5–8.1)

## 2020-05-08 LAB — CBC WITH DIFFERENTIAL/PLATELET
Abs Immature Granulocytes: 0.13 10*3/uL — ABNORMAL HIGH (ref 0.00–0.07)
Basophils Absolute: 0 10*3/uL (ref 0.0–0.1)
Basophils Relative: 0 %
Eosinophils Absolute: 0.1 10*3/uL (ref 0.0–0.5)
Eosinophils Relative: 1 %
HCT: 28.2 % — ABNORMAL LOW (ref 36.0–46.0)
Hemoglobin: 9.4 g/dL — ABNORMAL LOW (ref 12.0–15.0)
Immature Granulocytes: 2 %
Lymphocytes Relative: 13 %
Lymphs Abs: 1.2 10*3/uL (ref 0.7–4.0)
MCH: 33.8 pg (ref 26.0–34.0)
MCHC: 33.3 g/dL (ref 30.0–36.0)
MCV: 101.4 fL — ABNORMAL HIGH (ref 80.0–100.0)
Monocytes Absolute: 0.7 10*3/uL (ref 0.1–1.0)
Monocytes Relative: 7 %
Neutro Abs: 6.8 10*3/uL (ref 1.7–7.7)
Neutrophils Relative %: 77 %
Platelets: 336 10*3/uL (ref 150–400)
RBC: 2.78 MIL/uL — ABNORMAL LOW (ref 3.87–5.11)
RDW: 13.2 % (ref 11.5–15.5)
WBC: 8.9 10*3/uL (ref 4.0–10.5)
nRBC: 0 % (ref 0.0–0.2)

## 2020-05-08 LAB — PROTIME-INR
INR: 1.1 (ref 0.8–1.2)
Prothrombin Time: 13.3 seconds (ref 11.4–15.2)

## 2020-05-08 LAB — POC OCCULT BLOOD, ED: Fecal Occult Bld: POSITIVE — AB

## 2020-05-08 LAB — RESPIRATORY PANEL BY RT PCR (FLU A&B, COVID)
Influenza A by PCR: NEGATIVE
Influenza B by PCR: NEGATIVE
SARS Coronavirus 2 by RT PCR: NEGATIVE

## 2020-05-08 LAB — CYTOLOGY - NON PAP

## 2020-05-08 SURGERY — ESOPHAGOGASTRODUODENOSCOPY (EGD) WITH PROPOFOL
Anesthesia: General

## 2020-05-08 MED ORDER — METOCLOPRAMIDE HCL 5 MG/ML IJ SOLN
10.0000 mg | Freq: Once | INTRAMUSCULAR | Status: AC
  Administered 2020-05-08: 10 mg via INTRAVENOUS
  Filled 2020-05-08: qty 2

## 2020-05-08 MED ORDER — ONDANSETRON HCL 4 MG PO TABS: 4.0000 mg | ORAL_TABLET | Freq: Four times a day (QID) | ORAL | Status: DC | PRN

## 2020-05-08 MED ORDER — PANTOPRAZOLE SODIUM 40 MG IV SOLR
40.0000 mg | Freq: Two times a day (BID) | INTRAVENOUS | Status: DC
  Filled 2020-05-08: qty 40

## 2020-05-08 MED ORDER — LACTATED RINGERS IV SOLN
Freq: Once | INTRAVENOUS | Status: AC
  Administered 2020-05-08: 1000 mL via INTRAVENOUS

## 2020-05-08 MED ORDER — PANTOPRAZOLE SODIUM 40 MG IV SOLR
40.0000 mg | Freq: Once | INTRAVENOUS | Status: AC
  Administered 2020-05-08: 40 mg via INTRAVENOUS
  Filled 2020-05-08: qty 40

## 2020-05-08 MED ORDER — ATORVASTATIN CALCIUM 20 MG PO TABS
20.0000 mg | ORAL_TABLET | Freq: Every day | ORAL | Status: DC
  Administered 2020-05-08: 20 mg via ORAL
  Filled 2020-05-08: qty 1

## 2020-05-08 MED ORDER — PANTOPRAZOLE SODIUM 40 MG IV SOLR
40.0000 mg | Freq: Two times a day (BID) | INTRAVENOUS | Status: DC
  Administered 2020-05-08 – 2020-05-09 (×3): 40 mg via INTRAVENOUS
  Filled 2020-05-08 (×3): qty 40

## 2020-05-08 MED ORDER — ACETAMINOPHEN 650 MG RE SUPP: 650.0000 mg | Freq: Four times a day (QID) | RECTAL | Status: DC | PRN

## 2020-05-08 MED ORDER — PROPOFOL 500 MG/50ML IV EMUL
INTRAVENOUS | Status: DC | PRN
  Administered 2020-05-08: 150 ug/kg/min via INTRAVENOUS

## 2020-05-08 MED ORDER — METOPROLOL TARTRATE 5 MG/5ML IV SOLN
5.0000 mg | INTRAVENOUS | Status: AC | PRN
  Administered 2020-05-08: 5 mg via INTRAVENOUS
  Filled 2020-05-08: qty 5

## 2020-05-08 MED ORDER — SODIUM CHLORIDE 0.9 % IV SOLN: INTRAVENOUS | Status: DC

## 2020-05-08 MED ORDER — SODIUM CHLORIDE 0.9 % IV BOLUS
1000.0000 mL | Freq: Once | INTRAVENOUS | Status: AC
  Administered 2020-05-08: 1000 mL via INTRAVENOUS

## 2020-05-08 MED ORDER — ONDANSETRON HCL 4 MG/2ML IJ SOLN: 4.0000 mg | Freq: Four times a day (QID) | INTRAMUSCULAR | Status: DC | PRN

## 2020-05-08 MED ORDER — SALINE SPRAY 0.65 % NA SOLN
1.0000 | Freq: Every day | NASAL | Status: DC
  Administered 2020-05-09: 1 via NASAL
  Filled 2020-05-08 (×2): qty 44

## 2020-05-08 MED ORDER — LIDOCAINE VISCOUS HCL 2 % MT SOLN
15.0000 mL | Freq: Once | OROMUCOSAL | Status: AC
  Administered 2020-05-08: 15 mL via OROMUCOSAL

## 2020-05-08 MED ORDER — FAMOTIDINE 20 MG PO TABS: 40.0000 mg | ORAL_TABLET | Freq: Every day | ORAL | Status: DC

## 2020-05-08 MED ORDER — ACETAMINOPHEN 325 MG PO TABS
650.0000 mg | ORAL_TABLET | Freq: Four times a day (QID) | ORAL | Status: DC | PRN
  Administered 2020-05-09: 650 mg via ORAL
  Filled 2020-05-08 (×2): qty 2

## 2020-05-08 MED ORDER — FLUTICASONE PROPIONATE 50 MCG/ACT NA SUSP
1.0000 | Freq: Every day | NASAL | Status: DC
  Administered 2020-05-09: 1 via NASAL
  Filled 2020-05-08: qty 16

## 2020-05-08 MED ORDER — METOPROLOL SUCCINATE ER 50 MG PO TB24
100.0000 mg | ORAL_TABLET | Freq: Every morning | ORAL | Status: DC
  Administered 2020-05-09: 100 mg via ORAL
  Filled 2020-05-08 (×2): qty 2

## 2020-05-08 MED ORDER — ALBUTEROL SULFATE (2.5 MG/3ML) 0.083% IN NEBU: 3.0000 mL | INHALATION_SOLUTION | RESPIRATORY_TRACT | Status: DC | PRN

## 2020-05-08 MED ORDER — LACTATED RINGERS IV SOLN: INTRAVENOUS | Status: DC | PRN

## 2020-05-08 MED ORDER — SIMVASTATIN 10 MG PO TABS: 40.0000 mg | ORAL_TABLET | Freq: Every day | ORAL | Status: DC

## 2020-05-08 MED ORDER — LORAZEPAM 2 MG/ML IJ SOLN
0.5000 mg | Freq: Once | INTRAMUSCULAR | Status: AC
  Administered 2020-05-08: 0.5 mg via INTRAVENOUS
  Filled 2020-05-08: qty 1

## 2020-05-08 MED ORDER — AMLODIPINE BESYLATE 5 MG PO TABS
10.0000 mg | ORAL_TABLET | Freq: Every day | ORAL | Status: DC
  Administered 2020-05-09: 10 mg via ORAL
  Filled 2020-05-08: qty 2

## 2020-05-08 MED ORDER — ONDANSETRON HCL 4 MG/2ML IJ SOLN
4.0000 mg | Freq: Once | INTRAMUSCULAR | Status: AC
  Administered 2020-05-08: 4 mg via INTRAVENOUS
  Filled 2020-05-08: qty 2

## 2020-05-08 NOTE — Op Note (Signed)
[facility] Patient Name: April Guerra Procedure Date: 05/08/2020 12:42 PM MRN: [id number] Date of Birth: 04-06-1946 Attending MD: Maylon Peppers ,  CSN: [id number] Age: 74 Admit Type: Inpatient Procedure:                Upper GI endoscopy Indications:              Melena Providers:                Maylon Peppers, Janeece Riggers, RN, Nelma Rothman,                            Technician Referring MD:              Medicines:                Monitored Anesthesia Care Complications:            No immediate complications. Estimated Blood Loss:     Estimated blood loss: none. Procedure:                Pre-Anesthesia Assessment:                           - Prior to the procedure, a History and Physical                            was performed, and patient medications, allergies                            and sensitivities were reviewed. The patient's                            tolerance of previous anesthesia was reviewed.                           - The risks and benefits of the procedure and the                            sedation options and risks were discussed with the                            patient. All questions were answered and informed                            consent was obtained.                           - ASA Grade Assessment: III - A patient with severe                            systemic disease.                           After obtaining informed consent, the endoscope was                            passed under direct vision. Throughout the  procedure, the patient's blood pressure, pulse, and                            oxygen saturations were monitored continuously. The                            [id number]-[id number] ([id number]) scope was introduced through the                            mouth, and advanced to the second part of duodenum.                            The upper GI endoscopy was accomplished without                            difficulty. The  patient tolerated the procedure                            well. Scope In: 1:04:54 PM Scope Out: 1:19:22 PM Total Procedure Duration: 0 hours 14 minutes 28 seconds  Findings:      The examined esophagus was normal.      One non-bleeding cratered gastric ulcer with a clean ulcer base (Forrest       Class III) with presence of a central eschar was found on the anterior       wall of the stomach and on the lesser curvature of the stomach. The       lesion was 10 mm in largest dimension and had heaped-up borders.       Biopsies from the edges were taken with a cold forceps for histology.       Biopsies from antrum and body were also obtained to rule out H. pylori.      Mucosal changes characterized by coin shaped erythematous areas without       ulceration or bleeding (x6) were found in the gastric body and on the       greater curvature of the stomach.      The examined duodenum was normal. Impression:               - Normal esophagus.                           - Non-bleeding gastric ulcer with a clean ulcer                            base but heaped-up margins and eschar (Forrest                            Class III). Biopsied.                           - Coin shaped erythematous areas without ulceration                            or bleeding mucosa in the gastric body and greater  curvature.                           - Normal examined duodenum. Moderate Sedation:      Per Anesthesia Care Recommendation:           - Return patient to hospital ward for ongoing care.                           - Clear liquid diet today.                           - Await pathology results.                           - Continue pantoprazole 40 mg BID IV, will need to                            take it for at least 3 months.                           - No ibuprofen, naproxen, or other non-steroidal                            anti-inflammatory drugs.                           - Check  H.pylori serology, treat if positive,                           - Repeat upper endoscopy in 3 months for                            surveillance. Procedure Code(s):        --- Professional ---                           [phone], GC, Esophagogastroduodenoscopy, flexible,                            transoral; with biopsy, single or multiple Diagnosis Code(s):        --- Professional ---                           K25.9, Gastric ulcer, unspecified as acute or                            chronic, without hemorrhage or perforation                           K92.1, Melena (includes Hematochezia) CPT copyright 2019 American Medical Association. All rights reserved. The codes documented in this report are preliminary and upon coder review may  be revised to meet current compliance requirements. Maylon Peppers, MD Maylon Peppers,  05/08/2020 1:32:35 PM This report has been signed electronically. Number of Addenda: 0

## 2020-05-08 NOTE — Progress Notes (Signed)
HR 100-120s.  Messaged DR Wynetta Emery to advise. Pt denies chest tightness/pain or pressure. dnies nausea or sob. Pt is nondiaphoretic. Pt states just feels palpitations.

## 2020-05-08 NOTE — H&P (Signed)
History and Physical  April Guerra:[id number] DOB: [birth year] DOA: 05/08/2020  PCP: Rory Percy, MD  Patient coming from: Home   I have personally briefly reviewed patient's old medical records in [place]  Chief Complaint: GI bleed  HPI: April Guerra is a 74 y.o. female with medical history significant chronic atrial fibrillation on apixaban who recently had a total hysterectomy done for ovarian cancer had been off apixaban for more than 10 days and was on Lovenox 40 mg daily injections for DVT prophylaxis post surgery, she has hypertension tension hyperlipidemia asthma osteoarthritis and generalized anxiety disorder.  She has been doing fairly well postop until she started noticing nausea and stomach upset and having 1 black tarry stool.  She started having emesis with bright red blood and bile was also seen.  She takes Pepcid 40 mg daily for acid reflux symptoms.  ED Course: Patient arrived with good vital signs that were stable and she was given Zofran and IV Protonix and IV fluids.  Her hemoglobin was down to 9.4 from 12 prior to surgery.  She was noted to be fecal occult positive.  She was evaluated by the GI service and she will need to have upper endoscopy performed and further observation for need for transfusion as she is actively bleeding.    Review of Systems: As per HPI otherwise 10 point review of systems negative.    Past Medical History:  Diagnosis Date  . A-fib ([place])   . Anxiety   . Arthritis   . Asthma    hx of   . Dyspnea    with exertion   . H/O seasonal allergies   . Heart murmur    as aa child   . Hypercholesteremia   . Hypertension     Past Surgical History:  Procedure Laterality Date  . BREAST SURGERY    . CARDIAC CATHETERIZATION    . CHOLECYSTECTOMY    . COLONOSCOPY N/A 08/14/2015   Procedure: COLONOSCOPY;  Surgeon: Aviva Signs, MD;  Location: [facility];  Service: Gastroenterology;  Laterality: N/A;  . ROBOTIC  ASSISTED TOTAL HYSTERECTOMY WITH BILATERAL SALPINGO OOPHERECTOMY N/A 05/03/2020   Procedure: XI ROBOTIC ASSISTED TOTAL HYSTERECTOMY WITH BILATERAL SALPINGO OOPHORECTOMY, OMENTECTOMY,RADICAL TUMOR DEBULKING, RIGID PROSTOSCOPY;  Surgeon: Everitt Amber, MD;  Location: [facility];  Service: Gynecology;  Laterality: N/A;  . THROMBECTOMY FEMORAL ARTERY Right 12/17/2013   Procedure: REPAIR OF RIGHT FEMORAL ARTERY PSEUDOANEURYSM;  Surgeon: Elam Dutch, MD;  Location: [facility];  Service: Vascular;  Laterality: Right;     reports that she has never smoked. She has never used smokeless tobacco. She reports that she does not drink alcohol and does not use drugs.  Allergies  Allergen Reactions  . Morphine And Related Nausea And Vomiting    Family History  Problem Relation Age of Onset  . Hypertension Mother   . Heart failure Mother   . Asthma Mother   . Benign prostatic hyperplasia Father   . Ulcers Father   . Esophageal varices Father   . Atrial fibrillation Sister   . Atrial fibrillation Brother   . Heart disease Brother      Prior to Admission medications   Medication Sig Start Date End Date Taking? Authorizing Provider  A&D OINT Apply 1 application topically daily.    [provider]  acetaminophen (TYLENOL) 500 MG tablet Take 1,000 mg by mouth every 6 (six) hours as needed for moderate pain.  [provider]  albuterol (VENTOLIN HFA) 108 (90 Base) MCG/ACT inhaler Inhale 2 puffs into the lungs every 4 (four) hours as needed for wheezing or shortness of breath.  02/04/20   [provider]  amLODipine (NORVASC) 10 MG tablet Take 10 mg by mouth daily with lunch.     [provider]  Calcium Carb-Cholecalciferol (CALCIUM 600/VITAMIN D PO) Take 1 tablet by mouth in the morning and at bedtime.    [provider]  cetirizine (ZYRTEC) 10 MG tablet Take 10 mg by mouth daily.    [provider]  Cholecalciferol (VITAMIN D) 125 MCG (5000 UT) CAPS Take  5,000 Units by mouth daily.    [provider]  Coenzyme Q10 (COQ10) 100 MG CAPS Take 100 mg by mouth at bedtime.     [provider]  cyclobenzaprine (FLEXERIL) 10 MG tablet Take 5 mg by mouth daily as needed for muscle spasms.  04/22/19   [provider]  diltiazem (CARDIZEM) 30 MG tablet Take 1 tablet every 4 hours AS NEEDED for AFIB heart rate >100 Patient taking differently: Take 30 mg by mouth every 4 (four) hours as needed (AFIB heart rate > 100).  05/04/18   Sherran Needs, NP  enoxaparin (LOVENOX) 40 MG/0.4ML injection Inject 0.4 mLs (40 mg total) into the skin daily for 7 days. 05/04/20 05/11/20  Joylene John D, NP  famotidine (PEPCID) 40 MG tablet Take 40 mg by mouth daily.  03/11/19   [provider]  fluticasone (FLONASE) 50 MCG/ACT nasal spray Place 1 spray into both nostrils daily.  11/11/19   [provider]  lisinopril (ZESTRIL) 30 MG tablet Take 30 mg by mouth in the morning.     [provider]  Magnesium 100 MG TABS Take 50 mg by mouth at bedtime as needed (cramps).     [provider]  melatonin 5 MG TABS Take 2.5 mg by mouth at bedtime.     [provider]  metoprolol succinate (TOPROL-XL) 100 MG 24 hr tablet Take 100 mg by mouth in the morning. Take with or immediately following a meal.     [provider]  Multiple Vitamins-Minerals (CENTRUM SILVER ADULT 50+ PO) Take 1 tablet by mouth daily.    [provider]  oxyCODONE (OXY IR/ROXICODONE) 5 MG immediate release tablet Take 1 tablet (5 mg total) by mouth every 6 (six) hours as needed for severe pain. For AFTER surgery only, start with 0.5 tablet, do not take and drive 78/58/85   Cross, Lenna Sciara D, NP  senna (SENOKOT) 8.6 MG tablet Take 1 tablet by mouth in the morning.    [provider]  simvastatin (ZOCOR) 40 MG tablet Take 0.5 tablets (20 mg total) by mouth daily. Patient taking differently: Take 40 mg by mouth at bedtime.   12/19/13   Rhyne, Hulen Shouts, PA-C  sodium chloride (OCEAN) 0.65 % SOLN nasal spray Place 1 spray into both nostrils daily.    [provider]  vitamin C (ASCORBIC ACID) 500 MG tablet Take 500 mg by mouth daily.    [provider]    Physical Exam: Vitals:   05/08/20 0745 05/08/20 0800 05/08/20 0815 05/08/20 0830  BP:  140/72  (!) 147/71  Pulse: 94 93 85 79  Resp: 17 14 18 13   Temp:      TempSrc:      SpO2: 94% 100% 100% 98%  Weight:      Height:  Constitutional: pt appears pale, NAD, calm, comfortable Eyes: PERRL, lids and conjunctivae normal ENMT: Mucous membranes are moist. Posterior pharynx clear of any exudate or lesions.Normal dentition.  Neck: normal, supple, no masses, no thyromegaly Respiratory: clear to auscultation bilaterally, no wheezing, no crackles. Normal respiratory effort. No accessory muscle use.  Cardiovascular: Regular rate and rhythm, no murmurs / rubs / gallops. No extremity edema. 2+ pedal pulses. No carotid bruits.  Abdomen: no tenderness, no masses palpated. No hepatosplenomegaly. Bowel sounds positive.  Musculoskeletal: no clubbing / cyanosis. No joint deformity upper and lower extremities. Good ROM, no contractures. Normal muscle tone.  Skin: no rashes, lesions, ulcers. No induration Neurologic: CN 2-12 grossly intact. Sensation intact, DTR normal. Strength 5/5 in all 4.  Psychiatric: Normal judgment and insight. Alert and oriented x 3. Normal mood.   Labs on Admission: I have personally reviewed following labs and imaging studies  CBC: Recent Labs  Lab 05/08/20 0747  WBC 8.9  NEUTROABS 6.8  HGB 9.4*  HCT 28.2*  MCV 101.4*  PLT 062   Basic Metabolic Panel: Recent Labs  Lab 05/08/20 0747  NA 131*  K 4.4  CL 100  CO2 23  GLUCOSE 108*  BUN 38*  CREATININE 0.52  CALCIUM 8.9   GFR: Estimated Creatinine Clearance: 53 mL/min (by C-G formula based on SCr of 0.52 mg/dL). Liver Function Tests: Recent Labs  Lab  05/08/20 0747  AST 62*  ALT 62*  ALKPHOS 46  BILITOT 0.8  PROT 6.8  ALBUMIN 3.8   No results for input(s): LIPASE, AMYLASE in the last 168 hours. No results for input(s): AMMONIA in the last 168 hours. Coagulation Profile: Recent Labs  Lab 05/08/20 0747  INR 1.1   Cardiac Enzymes: No results for input(s): CKTOTAL, CKMB, CKMBINDEX, TROPONINI in the last 168 hours. BNP (last 3 results) No results for input(s): PROBNP in the last 8760 hours. HbA1C: No results for input(s): HGBA1C in the last 72 hours. CBG: No results for input(s): GLUCAP in the last 168 hours. Lipid Profile: No results for input(s): CHOL, HDL, LDLCALC, TRIG, CHOLHDL, LDLDIRECT in the last 72 hours. Thyroid Function Tests: No results for input(s): TSH, T4TOTAL, FREET4, T3FREE, THYROIDAB in the last 72 hours. Anemia Panel: No results for input(s): VITAMINB12, FOLATE, FERRITIN, TIBC, IRON, RETICCTPCT in the last 72 hours. Urine analysis:    Component Value Date/Time   COLORURINE STRAW (A) 04/25/2020 0909   APPEARANCEUR CLEAR 04/25/2020 0909   LABSPEC 1.005 04/25/2020 0909   PHURINE 8.0 04/25/2020 0909   GLUCOSEU NEGATIVE 04/25/2020 0909   HGBUR SMALL (A) 04/25/2020 0909   BILIRUBINUR NEGATIVE 04/25/2020 0909   KETONESUR NEGATIVE 04/25/2020 0909   PROTEINUR NEGATIVE 04/25/2020 0909   UROBILINOGEN 0.2 12/16/2013 2335   NITRITE NEGATIVE 04/25/2020 0909   LEUKOCYTESUR NEGATIVE 04/25/2020 0909   Radiological Exams on Admission: No results found.  Assessment/Plan Principal Problem:   Acute upper GI bleed Active Problems:   Pseudoaneurysm of femoral artery (HCC)   Chronic anticoagulation   Elevated CA-125   Right ovarian epithelial cancer (HCC)   Melena   Hematemesis   Hypertension   Hypercholesteremia   A-fib ([place])  1. Acute upper GI bleeding - hold all anticoagulation for now, continue IV protonix and supportive measures.  Appreciate GI evaluation.  Pt to go for upper endoscopy later today.  Type  and screen.   2. Post op s/p total hysterectomy - holding lovenox for now.  She was supposed to be able to start back on apixaban on  11/19 from her original postop plan.   3. Atrial fibrillation - temporarily holding anticoagulation during work up for GI bleed.    DVT prophylaxis: SCD Code Status: Full   Family Communication: husband at bedside   Disposition Plan: home   Consults called: gI   Admission status: INP   Marisol Glazer MD [facility] How to contact the [facility] Attending or Consulting provider [place] or covering provider during after hours [place], for this patient?  1. Check the care team in [facility] and look for a) attending/consulting [facility] provider listed and b) the [facility] team listed 2. Log into [email or web address] and use [place]'s universal password to access. If you do not have the password, please contact the hospital operator. 3. Locate the [facility] provider you are looking for under [facility] and page to a number that you can be directly reached. 4. If you still have difficulty reaching the provider, please page the [facility] (Director on Call) for the Hospitalists listed on amion for assistance.   If 7PM-7AM, please contact night-coverage [email or web address] Password [facility]  05/08/2020, 9:29 AM

## 2020-05-08 NOTE — ED Notes (Signed)
PA from GI at bedside.

## 2020-05-08 NOTE — ED Triage Notes (Signed)
Pt presents to ED via [facility] for black tarry stool, nausea and vomited this morning was dark started this am.

## 2020-05-08 NOTE — Progress Notes (Signed)
Took over care for patient at 2:00pm from [facility].

## 2020-05-08 NOTE — Anesthesia Preprocedure Evaluation (Addendum)
Anesthesia Evaluation  Patient identified by MRN, date of birth, ID band Patient awake    Reviewed: Allergy & Precautions, NPO status , Patient's Chart, lab work & pertinent test results  History of Anesthesia Complications Negative for: history of anesthetic complications  Airway Mallampati: II  TM Distance: >3 FB Neck ROM: Full    Dental no notable dental hx. (+) Dental Advisory Given, Teeth Intact Crowns :   Pulmonary shortness of breath, asthma ,    Pulmonary exam normal breath sounds clear to auscultation       Cardiovascular Exercise Tolerance: Good hypertension, Pt. on medications + Peripheral Vascular Disease  Normal cardiovascular exam+ dysrhythmias Atrial Fibrillation + Valvular Problems/Murmurs  Rhythm:Regular Rate:Normal - Systolic murmurs, - Diastolic murmurs, - Friction Rub, - Carotid Bruit, - Peripheral Edema and - Systolic Click Echo - 4259 -LVEF 60-65%, moderate LVH, normal wall motion, normal diastolic  function, mild MR, mild LAE, upper normal RA size, normal IVC.   08-May-2020 07:07:36 [facility] ROUTINE RECORD Sinus rhythm Lateral infarct, acute >>> Acute MI <<< Confirmed by Milton Ferguson [phone]) on 05/08/2020 7:27:29 AM   Neuro/Psych Anxiety negative neurological ROS     GI/Hepatic Neg liver ROS, Upper GI bleeding?   Endo/Other  negative endocrine ROS  Renal/GU negative Renal ROS  negative genitourinary   Musculoskeletal  (+) Arthritis ,   Abdominal   Peds negative pediatric ROS (+)  Hematology negative hematology ROS (+) anemia ,   Anesthesia Other Findings Pseudoaneurysm of femoral artery (HCC) Pseudoaneurysm (HCC) Pelvic mass Postmenopausal bleeding Thickened endometrium Chronic anticoagulation Elevated CA-125 Right ovarian epithelial cancer (HCC) Acute upper GI bleed Melena Hematemesis Hypertension Hypercholesteremia A-fib (HCC)     Reproductive/Obstetrics negative OB ROS                            Anesthesia Physical Anesthesia Plan  ASA: III and emergent  Anesthesia Plan: General   Post-op Pain Management:    Induction: Intravenous  PONV Risk Score and Plan: TIVA  Airway Management Planned: Nasal Cannula and Natural Airway  Additional Equipment:   Intra-op Plan:   Post-operative Plan: Possible Post-op intubation/ventilation  Informed Consent: I have reviewed the patients History and Physical, chart, labs and discussed the procedure including the risks, benefits and alternatives for the proposed anesthesia with the patient or authorized representative who has indicated his/her understanding and acceptance.     Dental advisory given  Plan Discussed with: CRNA and Surgeon  Anesthesia Plan Comments:         Anesthesia Quick Evaluation

## 2020-05-08 NOTE — Consult Note (Addendum)
@LOGO @   Referring Provider: Dr. Roderic Palau  Primary Care Physician:  Rory Percy, MD Primary Gastroenterologist:  Dr. Jenetta Downer  Date of Admission: 05/08/20 Date of Consultation: 05/08/20  Reason for Consultation: Hematemesis, melena, anemia, heme positive stool  HPI:  April Guerra is a 74 y.o. year old female with history of atrial fibrillation chronically on Eliquis but currently on Lovenox following recent robotic assisted total hysterectomy with bilateral salpingo-oophorectomy on 11/11, HTN, HLD, asthma, anxiety, and arthritis who presented to the emergency room today due to new onset of black emesis x1 with some bright red blood as well as black tarry stools x1.  Hematemesis and melena occurred this morning at 3:30 AM.  No recurrent symptoms.  She does have some mild residual nausea.  Denies abdominal pain.  She has never had any similar symptoms.  She does have history of acid reflux that is well controlled on Pepcid 40 mg daily.  Denies dysphagia.  No NSAIDs.  No alcohol or drug use.  No prior EGD.  Colonoscopy in 2017 with diverticulosis, otherwise normal with recommendations to repeat in 10 years.  She last ate at 7 PM.  She is not had anything to drink since 1 AM.  Her last dose of Lovenox was at 1 PM yesterday.  She had mild lightheadedness and weakness this morning after her episode of emesis and melena, but this has resolved.  No chest pain or heart palpitations.  No shortness of breath or cough.  In the ED: She remained hemodynamically stable.  She received 1 L IV fluids, Zofran, Reglan 10 mg, and IV Protonix 40 mg.  Hemoglobin was found to be 9.4 (L), platelets within normal limits, sodium slightly low at 131, BUN elevated at 38, AST and ALT mildly elevated at 62/62, INR 1.1.  Fecal occult blood positive.  Respiratory panel negative.  Past Medical History:  Diagnosis Date  . A-fib ([place])   . Anxiety   . Arthritis   . Asthma    hx of   . Dyspnea    with exertion   . H/O  seasonal allergies   . Heart murmur    as aa child   . Hypercholesteremia   . Hypertension     Past Surgical History:  Procedure Laterality Date  . BREAST SURGERY    . CARDIAC CATHETERIZATION    . CHOLECYSTECTOMY    . COLONOSCOPY N/A 08/14/2015   Procedure: COLONOSCOPY;  Surgeon: Aviva Signs, MD;  Location: [facility];  Service: Gastroenterology;  Laterality: N/A;  . ROBOTIC ASSISTED TOTAL HYSTERECTOMY WITH BILATERAL SALPINGO OOPHERECTOMY N/A 05/03/2020   Procedure: XI ROBOTIC ASSISTED TOTAL HYSTERECTOMY WITH BILATERAL SALPINGO OOPHORECTOMY, OMENTECTOMY,RADICAL TUMOR DEBULKING, RIGID PROSTOSCOPY;  Surgeon: Everitt Amber, MD;  Location: [facility];  Service: Gynecology;  Laterality: N/A;  . THROMBECTOMY FEMORAL ARTERY Right 12/17/2013   Procedure: REPAIR OF RIGHT FEMORAL ARTERY PSEUDOANEURYSM;  Surgeon: Elam Dutch, MD;  Location: [facility];  Service: Vascular;  Laterality: Right;    Prior to Admission medications   Medication Sig Start Date End Date Taking? Authorizing Provider  amLODipine (NORVASC) 10 MG tablet Take 10 mg by mouth daily with lunch.    Yes [provider]  Calcium Carb-Cholecalciferol (CALCIUM 600/VITAMIN D PO) Take 1 tablet by mouth in the morning and at bedtime.   Yes [provider]  cetirizine (ZYRTEC) 10 MG tablet Take 10 mg by mouth daily.   Yes [provider]  Cholecalciferol (VITAMIN D) 125 MCG (5000 UT) CAPS Take 5,000  Units by mouth daily.   Yes [provider]  Coenzyme Q10 (COQ10) 100 MG CAPS Take 100 mg by mouth at bedtime.    Yes [provider]  cyclobenzaprine (FLEXERIL) 10 MG tablet Take 5 mg by mouth daily as needed for muscle spasms.  04/22/19  Yes [provider]  diltiazem (CARDIZEM) 30 MG tablet Take 1 tablet every 4 hours AS NEEDED for AFIB heart rate >100 Patient taking differently: Take 30 mg by mouth every 4 (four) hours as needed (AFIB heart rate > 100).  05/04/18  Yes Sherran Needs, NP   enoxaparin (LOVENOX) 40 MG/0.4ML injection Inject 0.4 mLs (40 mg total) into the skin daily for 7 days. 05/04/20 05/11/20 Yes Cross, Melissa D, NP  famotidine (PEPCID) 40 MG tablet Take 40 mg by mouth daily.  03/11/19  Yes [provider]  fluticasone (FLONASE) 50 MCG/ACT nasal spray Place 1 spray into both nostrils daily.  11/11/19  Yes [provider]  lisinopril (ZESTRIL) 30 MG tablet Take 30 mg by mouth in the morning.    Yes [provider]  Magnesium 100 MG TABS Take 50 mg by mouth at bedtime as needed (cramps).    Yes [provider]  melatonin 5 MG TABS Take 2.5 mg by mouth at bedtime.    Yes [provider]  metoprolol succinate (TOPROL-XL) 100 MG 24 hr tablet Take 100 mg by mouth in the morning. Take with or immediately following a meal.    Yes [provider]  Multiple Vitamins-Minerals (CENTRUM SILVER ADULT 50+ PO) Take 1 tablet by mouth daily.   Yes [provider]  senna (SENOKOT) 8.6 MG tablet Take 1 tablet by mouth in the morning.   Yes [provider]  simvastatin (ZOCOR) 40 MG tablet Take 0.5 tablets (20 mg total) by mouth daily. Patient taking differently: Take 40 mg by mouth at bedtime.  12/19/13  Yes Rhyne, Samantha J, PA-C  sodium chloride (OCEAN) 0.65 % SOLN nasal spray Place 1 spray into both nostrils daily.   Yes [provider]  vitamin C (ASCORBIC ACID) 500 MG tablet Take 500 mg by mouth daily.   Yes [provider]  A&D OINT Apply 1 application topically daily.    [provider]  acetaminophen (TYLENOL) 500 MG tablet Take 1,000 mg by mouth every 6 (six) hours as needed for moderate pain.     [provider]  albuterol (VENTOLIN HFA) 108 (90 Base) MCG/ACT inhaler Inhale 2 puffs into the lungs every 4 (four) hours as needed for wheezing or shortness of breath.  02/04/20   [provider]  apixaban (ELIQUIS) 5 MG TABS tablet Take 5 mg by mouth in the morning and  at bedtime.    [provider]  oxyCODONE (OXY IR/ROXICODONE) 5 MG immediate release tablet Take 1 tablet (5 mg total) by mouth every 6 (six) hours as needed for severe pain. For AFTER surgery only, start with 0.5 tablet, do not take and drive Patient not taking: Reported on 05/08/2020 04/20/20   Dorothyann Gibbs, NP    Current Facility-Administered Medications  Medication Dose Route Frequency Provider Last Rate Last Admin  . pantoprazole (PROTONIX) injection 40 mg  40 mg Intravenous Q12H Johnson, Clanford L, MD       Current Outpatient Medications  Medication Sig Dispense Refill  . acetaminophen (TYLENOL) 500 MG tablet Take 1,000 mg by mouth every 6 (six) hours as needed for moderate pain.     Marland Kitchen  albuterol (VENTOLIN HFA) 108 (90 Base) MCG/ACT inhaler Inhale 2 puffs into the lungs every 4 (four) hours as needed for wheezing or shortness of breath.     Marland Kitchen amLODipine (NORVASC) 10 MG tablet Take 10 mg by mouth daily with lunch.     . Calcium Carb-Cholecalciferol (CALCIUM 600/VITAMIN D PO) Take 1 tablet by mouth in the morning and at bedtime.    . cetirizine (ZYRTEC) 10 MG tablet Take 10 mg by mouth daily.    . Cholecalciferol (VITAMIN D) 125 MCG (5000 UT) CAPS Take 5,000 Units by mouth daily.    . Coenzyme Q10 (COQ10) 100 MG CAPS Take 100 mg by mouth at bedtime.     . cyclobenzaprine (FLEXERIL) 10 MG tablet Take 5 mg by mouth daily as needed for muscle spasms.     Marland Kitchen diltiazem (CARDIZEM) 30 MG tablet Take 1 tablet every 4 hours AS NEEDED for AFIB heart rate >100 (Patient taking differently: Take 30 mg by mouth every 4 (four) hours as needed (AFIB heart rate > 100). ) 30 tablet 3  . enoxaparin (LOVENOX) 40 MG/0.4ML injection Inject 0.4 mLs (40 mg total) into the skin daily for 7 days. 2.8 mL 0  . famotidine (PEPCID) 40 MG tablet Take 40 mg by mouth daily.     . fluticasone (FLONASE) 50 MCG/ACT nasal spray Place 1 spray into both nostrils daily.     Marland Kitchen lisinopril (ZESTRIL) 30 MG tablet Take 30  mg by mouth in the morning.     . Magnesium 100 MG TABS Take 50 mg by mouth at bedtime as needed (cramps).     . melatonin 5 MG TABS Take 2.5 mg by mouth at bedtime.     . metoprolol succinate (TOPROL-XL) 100 MG 24 hr tablet Take 100 mg by mouth in the morning. Take with or immediately following a meal.     . Multiple Vitamins-Minerals (CENTRUM SILVER ADULT 50+ PO) Take 1 tablet by mouth daily.    Marland Kitchen senna (SENOKOT) 8.6 MG tablet Take 1 tablet by mouth in the morning.    . simvastatin (ZOCOR) 40 MG tablet Take 0.5 tablets (20 mg total) by mouth daily. (Patient taking differently: Take 40 mg by mouth at bedtime. ) 30 tablet 2  . sodium chloride (OCEAN) 0.65 % SOLN nasal spray Place 1 spray into both nostrils daily.    . vitamin C (ASCORBIC ACID) 500 MG tablet Take 500 mg by mouth daily.    Marland Kitchen apixaban (ELIQUIS) 5 MG TABS tablet Take 5 mg by mouth in the morning and at bedtime.    Marland Kitchen oxyCODONE (OXY IR/ROXICODONE) 5 MG immediate release tablet Take 1 tablet (5 mg total) by mouth every 6 (six) hours as needed for severe pain. For AFTER surgery only, start with 0.5 tablet, do not take and drive (Patient not taking: Reported on 05/08/2020) 10 tablet 0    Allergies as of 05/08/2020 - Review Complete 05/08/2020  Allergen Reaction Noted  . Morphine and related Nausea And Vomiting 05/03/2020    Family History  Problem Relation Age of Onset  . Hypertension Mother   . Heart failure Mother   . Asthma Mother   . Ulcers Mother   . Benign prostatic hyperplasia Father   . Ulcers Father   . Esophageal varices Father   . Atrial fibrillation Sister   . Atrial fibrillation Brother   . Heart disease Brother   . Colon cancer Neg Hx     Social History   Socioeconomic History  .  Marital status: Married    Spouse name: Not on file  . Number of children: Not on file  . Years of education: Not on file  . Highest education level: Not on file  Occupational History  . Occupation: nurse  Tobacco Use  .  Smoking status: Never Smoker  . Smokeless tobacco: Never Used  Vaping Use  . Vaping Use: Never used  Substance and Sexual Activity  . Alcohol use: No  . Drug use: No  . Sexual activity: Not Currently  Other Topics Concern  . Not on file  Social History Narrative  . Not on file   Social Determinants of Health   Financial Resource Strain:   . Difficulty of Paying Living Expenses: Not on file  Food Insecurity:   . Worried About Charity fundraiser in the Last Year: Not on file  . Ran Out of Food in the Last Year: Not on file  Transportation Needs:   . Lack of Transportation (Medical): Not on file  . Lack of Transportation (Non-Medical): Not on file  Physical Activity:   . Days of Exercise per Week: Not on file  . Minutes of Exercise per Session: Not on file  Stress:   . Feeling of Stress : Not on file  Social Connections:   . Frequency of Communication with Friends and Family: Not on file  . Frequency of Social Gatherings with Friends and Family: Not on file  . Attends Religious Services: Not on file  . Active Member of Clubs or Organizations: Not on file  . Attends Archivist Meetings: Not on file  . Marital Status: Not on file  Intimate Partner Violence:   . Fear of Current or Ex-Partner: Not on file  . Emotionally Abused: Not on file  . Physically Abused: Not on file  . Sexually Abused: Not on file    Review of Systems: Gen: Denies fever, chills, cold or flulike symptoms. CV: See HPI Resp: See HPI GI: See HPI Heme: See HPI  Physical Exam: Vital signs in last 24 hours: Temp:  [98.2 F (36.8 C)] 98.2 F (36.8 C) (11/16 0708) Pulse Rate:  [79-95] 90 (11/16 1005) Resp:  [13-29] 16 (11/16 1005) BP: (140-161)/(70-84) 161/77 (11/16 1005) SpO2:  [94 %-100 %] 99 % (11/16 1005) Weight:  [72.6 kg] 72.6 kg (11/16 0700)   General:   Alert,  Well-developed, well-nourished, pleasant and cooperative in NAD Head:  Normocephalic and atraumatic. Eyes:  Sclera  clear, no icterus.   Conjunctiva pink. Ears:  Normal auditory acuity. Lungs:  Clear throughout to auscultation.   No wheezes, crackles, or rhonchi. No acute distress. Heart:  Regular rate and rhythm; no murmurs, clicks, rubs,  or gallops. Abdomen:  Soft,and nondistended. Diffuse ecchymosis with well-healing incision sites from recent surgery.  Mild diffuse tenderness which patient reports is secondary to her surgery and is improving.  No masses, hepatosplenomegaly or hernias noted. Normal bowel sounds, without guarding, and without rebound.   Rectal:  Deferred  Msk:  Symmetrical without gross deformities. Normal posture. Extremities:  Without edema. Neurologic:  Alert and  oriented x4;  grossly normal neurologically. Psych:  Normal mood and affect.  Intake/Output from previous day: No intake/output data recorded. Intake/Output this shift: Total I/O In: 1000 [IV Piggyback:1000] Out: -   Lab Results: Recent Labs    05/08/20 0747  WBC 8.9  HGB 9.4*  HCT 28.2*  PLT 336   BMET Recent Labs    05/08/20 0747  NA 131*  K 4.4  CL 100  CO2 23  GLUCOSE 108*  BUN 38*  CREATININE 0.52  CALCIUM 8.9   LFT Recent Labs    05/08/20 0747  PROT 6.8  ALBUMIN 3.8  AST 62*  ALT 62*  ALKPHOS 46  BILITOT 0.8   PT/INR Recent Labs    05/08/20 0747  LABPROT 13.3  INR 1.1    Impression: 74 y.o. year old female with history of atrial fibrillation chronically on Eliquis but currently on Lovenox following recent robotic assisted total hysterectomy with bilateral salpingo-oophorectomy on 11/11, HTN, HLD, asthma, anxiety, and arthritis who presented to the emergency room today due to new onset of black emesis x1 with some bright red blood as well as black tarry stools x1 which occurred at 3:30 this morning. No additional emesis or melena.  Denies abdominal pain.  In the ED, she was found to have hemoglobin 9.4, down from 14.7 on 11/3, BUN elevated at 38, FOBT positive.  She remained  hemodynamically stable.  She denies NSAIDs, alcohol use, history of drug use, or any similar symptoms in the past.  She does have history of GERD but this is well controlled on Pepcid 40 mg daily.  Denies dysphagia, BRBPR, or any other significant upper or lower GI symptoms.  Suspect upper GI bleed.  Differentials include gastric ulcer, gastritis, duodenitis, AVMs, or other etiology.  She is going to need EGD for further evaluation.  Notably, her last dose of Lovenox was yesterday at 1 PM.  Last meal was at 7 PM yesterday.  Last liquid intake was 1 AM this morning.  I have discussed case with Dr. Jenetta Downer with plans to proceed with EGD today.  Of note, not sure decline in hemoglobin from 14.7 to 9.4 is all secondary to  GI bleed as she did not have hemoglobin following recent surgery.   Plan: Proceed with EGD with propofol with Dr. Jenetta Downer today. IV PPI twice daily. Keep n.p.o. for now. Monitor for overt GI bleeding Monitor H/H and transfuse for hemoglobin less than 7 Continue to hold lovenox Further recommendations to follow.   LOS: 0 days    05/08/2020, 11:09 AM   Aliene Altes, [facility]

## 2020-05-08 NOTE — Transfer of Care (Signed)
Immediate Anesthesia Transfer of Care Note  Patient: April Guerra  Procedure(s) Performed: ESOPHAGOGASTRODUODENOSCOPY (EGD) WITH PROPOFOL (N/A ) BIOPSY  Patient Location: PACU  Anesthesia Type:General  Level of Consciousness: awake, alert , oriented and patient cooperative  Airway & Oxygen Therapy: Patient Spontanous Breathing  Post-op Assessment: Report given to RN, Post -op Vital signs reviewed and stable and Patient moving all extremities X 4  Post vital signs: Reviewed and stable  Last Vitals:  Vitals Value Taken Time  BP    Temp    Pulse    Resp    SpO2      Last Pain:  Vitals:   05/08/20 1220  TempSrc: Oral  PainSc: 0-No pain      Patients Stated Pain Goal: 6 (08/20/38 6986)  Complications: No complications documented.

## 2020-05-08 NOTE — Brief Op Note (Signed)
05/08/2020  1:32 PM  PATIENT:  April Guerra  74 y.o. female  PRE-OPERATIVE DIAGNOSIS:  melena/hematemesis  POST-OPERATIVE DIAGNOSIS:  hiatal herna, gastric ulcer  PROCEDURE:  Procedure(s): ESOPHAGOGASTRODUODENOSCOPY (EGD) WITH PROPOFOL (N/A) BIOPSY  SURGEON:  Surgeon(s) and Role:    * Harvel Quale, MD - Primary  Performed EGD today under propofol sedation.  Patient was found to have a 1 cm nonbleeding ulcer without central eschar, presence of heaped up borders.  Tissue was friable.  Biopsies were taken from the edges of the ulcer and from antrum and body to rule out H. pylori.  There were also presence of coin shaped erythematous lesions in the gastric body without any evidence of active bleeding or ulceration.  Otherwise, esophagus and duodenum were within normal limits.  RECOMMENDATIONS: - Return patient to hospital ward for ongoing care.  - Clear liquid diet today.  - Await pathology results.  - Continue pantoprazole 40 mg BID IV, will need to take it for at least 3 months. - No ibuprofen, naproxen, or other non-steroidal anti-inflammatory drugs.  - Check H.pylori serology, treat if positive, - Repeat upper endoscopy in 3 months for surveillance.   Maylon Peppers, MD Gastroenterology and Hepatology [facility]

## 2020-05-08 NOTE — ED Provider Notes (Signed)
[facility] EMERGENCY DEPARTMENT Provider Note   CSN: [id number] Arrival date & time: 05/08/20  4431     History Chief Complaint  Patient presents with  . Rectal Bleeding    April Guerra is a 74 y.o. female.  Patient complains of vomiting blood and having black stool today this started at 3 AM this morning.  Patient is on Lovenox for atrial fib.  She had surgery for hysterectomy and oophorectomy approximately 10 to 14 days ago.  The history is provided by the patient and medical records. No language interpreter was used.  Rectal Bleeding Quality:  Black and tarry Amount:  Moderate Timing:  Constant Chronicity:  New Context: not anal fissures   Similar prior episodes: no   Relieved by:  Nothing Worsened by:  Nothing Ineffective treatments:  None tried Associated symptoms: no abdominal pain   Risk factors: anticoagulant use        Past Medical History:  Diagnosis Date  . A-fib ([place])   . Anxiety   . Arthritis   . Asthma    hx of   . Dyspnea    with exertion   . H/O seasonal allergies   . Heart murmur    as aa child   . Hypercholesteremia   . Hypertension     Patient Active Problem List   Diagnosis Date Noted  . Acute upper GI bleed 05/08/2020  . Melena 05/08/2020  . Hematemesis 05/08/2020  . Hypertension   . Hypercholesteremia   . A-fib ([place])   . Elevated CA-125 05/03/2020  . Right ovarian epithelial cancer ([place])   . Pelvic mass 04/20/2020  . Postmenopausal bleeding 04/20/2020  . Thickened endometrium 04/20/2020  . Chronic anticoagulation 04/20/2020  . Pseudoaneurysm ([place]) 01/12/2014  . Pseudoaneurysm of femoral artery ([place]) 12/16/2013    Past Surgical History:  Procedure Laterality Date  . BREAST SURGERY    . CARDIAC CATHETERIZATION    . CHOLECYSTECTOMY    . COLONOSCOPY N/A 08/14/2015   Procedure: COLONOSCOPY;  Surgeon: Aviva Signs, MD;  Location: [facility];  Service: Gastroenterology;  Laterality: N/A;  . ROBOTIC ASSISTED TOTAL  HYSTERECTOMY WITH BILATERAL SALPINGO OOPHERECTOMY N/A 05/03/2020   Procedure: XI ROBOTIC ASSISTED TOTAL HYSTERECTOMY WITH BILATERAL SALPINGO OOPHORECTOMY, OMENTECTOMY,RADICAL TUMOR DEBULKING, RIGID PROSTOSCOPY;  Surgeon: Everitt Amber, MD;  Location: [facility] ORS;  Service: Gynecology;  Laterality: N/A;  . THROMBECTOMY FEMORAL ARTERY Right 12/17/2013   Procedure: REPAIR OF RIGHT FEMORAL ARTERY PSEUDOANEURYSM;  Surgeon: Elam Dutch, MD;  Location: [place];  Service: Vascular;  Laterality: Right;     OB History   No obstetric history on file.     Family History  Problem Relation Age of Onset  . Hypertension Mother   . Heart failure Mother   . Asthma Mother   . Ulcers Mother   . Benign prostatic hyperplasia Father   . Ulcers Father   . Esophageal varices Father   . Atrial fibrillation Sister   . Atrial fibrillation Brother   . Heart disease Brother   . Colon cancer Neg Hx     Social History   Tobacco Use  . Smoking status: Never Smoker  . Smokeless tobacco: Never Used  Vaping Use  . Vaping Use: Never used  Substance Use Topics  . Alcohol use: No  . Drug use: No    Home Medications Prior to Admission medications   Medication Sig Start Date End Date Taking? Authorizing Provider  acetaminophen (TYLENOL) 500 MG tablet Take 1,000 mg by mouth  every 6 (six) hours as needed for moderate pain.    Yes [provider]  albuterol (VENTOLIN HFA) 108 (90 Base) MCG/ACT inhaler Inhale 2 puffs into the lungs every 4 (four) hours as needed for wheezing or shortness of breath.  02/04/20  Yes [provider]  amLODipine (NORVASC) 10 MG tablet Take 10 mg by mouth daily with lunch.    Yes [provider]  Calcium Carb-Cholecalciferol (CALCIUM 600/VITAMIN D PO) Take 1 tablet by mouth in the morning and at bedtime.   Yes [provider]  cetirizine (ZYRTEC) 10 MG tablet Take 10 mg by mouth daily.   Yes [provider]  Cholecalciferol (VITAMIN D) 125 MCG (5000  UT) CAPS Take 5,000 Units by mouth daily.   Yes [provider]  Coenzyme Q10 (COQ10) 100 MG CAPS Take 100 mg by mouth at bedtime.    Yes [provider]  cyclobenzaprine (FLEXERIL) 10 MG tablet Take 5 mg by mouth daily as needed for muscle spasms.  04/22/19  Yes [provider]  diltiazem (CARDIZEM) 30 MG tablet Take 1 tablet every 4 hours AS NEEDED for AFIB heart rate >100 Patient taking differently: Take 30 mg by mouth every 4 (four) hours as needed (AFIB heart rate > 100).  05/04/18  Yes Sherran Needs, NP  enoxaparin (LOVENOX) 40 MG/0.4ML injection Inject 0.4 mLs (40 mg total) into the skin daily for 7 days. 05/04/20 05/11/20 Yes Cross, Melissa D, NP  famotidine (PEPCID) 40 MG tablet Take 40 mg by mouth daily.  03/11/19  Yes [provider]  fluticasone (FLONASE) 50 MCG/ACT nasal spray Place 1 spray into both nostrils daily.  11/11/19  Yes [provider]  lisinopril (ZESTRIL) 30 MG tablet Take 30 mg by mouth in the morning.    Yes [provider]  Magnesium 100 MG TABS Take 50 mg by mouth at bedtime as needed (cramps).    Yes [provider]  melatonin 5 MG TABS Take 2.5 mg by mouth at bedtime.    Yes [provider]  metoprolol succinate (TOPROL-XL) 100 MG 24 hr tablet Take 100 mg by mouth in the morning. Take with or immediately following a meal.    Yes [provider]  Multiple Vitamins-Minerals (CENTRUM SILVER ADULT 50+ PO) Take 1 tablet by mouth daily.   Yes [provider]  senna (SENOKOT) 8.6 MG tablet Take 1 tablet by mouth in the morning.   Yes [provider]  simvastatin (ZOCOR) 40 MG tablet Take 0.5 tablets (20 mg total) by mouth daily. Patient taking differently: Take 40 mg by mouth at bedtime.  12/19/13  Yes Rhyne, Samantha J, PA-C  sodium chloride (OCEAN) 0.65 % SOLN nasal spray Place 1 spray into both nostrils daily.   Yes [provider]  vitamin C (ASCORBIC ACID) 500 MG  tablet Take 500 mg by mouth daily.   Yes [provider]  apixaban (ELIQUIS) 5 MG TABS tablet Take 5 mg by mouth in the morning and at bedtime.    [provider]  oxyCODONE (OXY IR/ROXICODONE) 5 MG immediate release tablet Take 1 tablet (5 mg total) by mouth every 6 (six) hours as needed for severe pain. For AFTER surgery only, start with 0.5 tablet, do not take and drive Patient not taking: Reported on 05/08/2020 04/20/20   Joylene John D, NP    Allergies    Morphine and related  Review of Systems   Review of Systems  Constitutional: Negative for  appetite change and fatigue.  HENT: Negative for congestion, ear discharge and sinus pressure.   Eyes: Negative for discharge.  Respiratory: Negative for cough.   Cardiovascular: Negative for chest pain.  Gastrointestinal: Positive for hematochezia. Negative for abdominal pain and diarrhea.       Vomiting blood and black stools  Genitourinary: Negative for frequency and hematuria.  Musculoskeletal: Negative for back pain.  Skin: Negative for rash.  Neurological: Negative for seizures and headaches.  Psychiatric/Behavioral: Negative for hallucinations.    Physical Exam Updated Vital Signs BP (!) 161/77   Pulse 90   Temp 98.2 F (36.8 C) (Oral)   Resp 16   Ht 4\' 10"  (1.473 m)   Wt 72.6 kg   SpO2 99%   BMI 33.44 kg/m   Physical Exam Vitals and nursing note reviewed.  Constitutional:      Appearance: She is well-developed.  HENT:     Head: Normocephalic.     Nose: Nose normal.  Eyes:     General: No scleral icterus.    Conjunctiva/sclera: Conjunctivae normal.  Neck:     Thyroid: No thyromegaly.  Cardiovascular:     Rate and Rhythm: Normal rate and regular rhythm.     Heart sounds: No murmur heard.  No friction rub. No gallop.   Pulmonary:     Breath sounds: No stridor. No wheezing or rales.  Chest:     Chest wall: No tenderness.  Abdominal:     General: There is no distension.     Tenderness:  There is no abdominal tenderness. There is no rebound.  Genitourinary:    Comments: Black stools are heme positive Musculoskeletal:        General: Normal range of motion.     Cervical back: Neck supple.  Lymphadenopathy:     Cervical: No cervical adenopathy.  Skin:    Findings: No erythema or rash.  Neurological:     Mental Status: She is alert and oriented to person, place, and time.     Motor: No abnormal muscle tone.     Coordination: Coordination normal.  Psychiatric:        Behavior: Behavior normal.     ED Results / Procedures / Treatments   Labs (all labs ordered are listed, but only abnormal results are displayed) Labs Reviewed  CBC WITH DIFFERENTIAL/PLATELET - Abnormal; Notable for the following components:      Result Value   RBC 2.78 (*)    Hemoglobin 9.4 (*)    HCT 28.2 (*)    MCV 101.4 (*)    Abs Immature Granulocytes 0.13 (*)    All other components within normal limits  COMPREHENSIVE METABOLIC PANEL - Abnormal; Notable for the following components:   Sodium 131 (*)    Glucose, Bld 108 (*)    BUN 38 (*)    AST 62 (*)    ALT 62 (*)    All other components within normal limits  POC OCCULT BLOOD, ED - Abnormal; Notable for the following components:   Fecal Occult Bld POSITIVE (*)    All other components within normal limits  RESPIRATORY PANEL BY RT PCR (FLU A&B, COVID)  PROTIME-INR  OCCULT BLOOD X 1 CARD TO LAB, STOOL  TYPE AND SCREEN    EKG EKG Interpretation  Date/Time:  Tuesday May 08 2020 07:07:36 EST Ventricular Rate:  87 PR Interval:    QRS Duration: 81 QT Interval:  360 QTC Calculation: 433 R Axis:   27 Text Interpretation: Sinus rhythm Lateral  infarct, acute >>> Acute MI <<< Confirmed by Milton Ferguson [id number]) on 05/08/2020 7:27:29 AM   Radiology No results found.  Procedures Procedures (including critical care time)  Medications Ordered in ED Medications  pantoprazole (PROTONIX) injection 40 mg (has no administration in  time range)  pantoprazole (PROTONIX) injection 40 mg (40 mg Intravenous Given 05/08/20 0759)  sodium chloride 0.9 % bolus 1,000 mL (0 mLs Intravenous Stopped 05/08/20 0915)  ondansetron (ZOFRAN) injection 4 mg (4 mg Intravenous Given 05/08/20 0821)  metoCLOPramide (REGLAN) injection 10 mg (10 mg Intravenous Given 05/08/20 1002)    ED Course  I have reviewed the triage vital signs and the nursing notes.  Pertinent labs & imaging results that were available during my care of the patient were reviewed by me and considered in my medical decision making (see chart for details). CRITICAL CARE Performed by: Milton Ferguson Total critical care time:40 minutes Critical care time was exclusive of separately billable procedures and treating other patients. Critical care was necessary to treat or prevent imminent or life-threatening deterioration. Critical care was time spent personally by me on the following activities: development of treatment plan with patient and/or surrogate as well as nursing, discussions with consultants, evaluation of patient's response to treatment, examination of patient, obtaining history from patient or surrogate, ordering and performing treatments and interventions, ordering and review of laboratory studies, ordering and review of radiographic studies, pulse oximetry and re-evaluation of patient's condition.    MDM Rules/Calculators/A&P                          Patient with an upper GI bleed.  Patient will be seen by GI and probably have endoscopy today.  Hospitalist will admit       This patient presents to the ED for concern of vomiting blood, this involves an extensive number of treatment options, and is a complaint that carries with it a high risk of complications and morbidity.  The differential diagnosis includes upper GI bleed   Lab Tests:   I Ordered, reviewed, and interpreted labs, which included CBC and chemistries and patient has elevated BUN with  hemoglobin that is low at 9.4  Medicines ordered:   I ordered medication Protonix for gastritis  Imaging Studies ordered:   Additional history obtained:   Additional history obtained from spouse  Previous records obtained and reviewed.  Consultations Obtained:   I consulted GI and hospitalist and discussed lab and imaging findings  Reevaluation:  After the interventions stated above, I reevaluated the patient and found mildly improved  Critical Interventions:  .   Final Clinical Impression(s) / ED Diagnoses Final diagnoses:  Rectal bleeding    Rx / DC Orders ED Discharge Orders    None       Milton Ferguson, MD 05/08/20 1018

## 2020-05-08 NOTE — Anesthesia Postprocedure Evaluation (Signed)
Anesthesia Post Note  Patient: April Guerra  Procedure(s) Performed: ESOPHAGOGASTRODUODENOSCOPY (EGD) WITH PROPOFOL (N/A ) BIOPSY  Patient location during evaluation: PACU Anesthesia Type: General Level of consciousness: awake, oriented, awake and alert and patient cooperative Pain management: satisfactory to patient Vital Signs Assessment: post-procedure vital signs reviewed and stable Respiratory status: spontaneous breathing, respiratory function stable and nonlabored ventilation Cardiovascular status: stable Postop Assessment: no apparent nausea or vomiting Anesthetic complications: no   No complications documented.   Last Vitals:  Vitals:   05/08/20 1130 05/08/20 1220  BP: (!) 141/63 (!) 170/70  Pulse: 85 92  Resp: 15 14  Temp:  37 C  SpO2: 94% 99%    Last Pain:  Vitals:   05/08/20 1220  TempSrc: Oral  PainSc: 0-No pain                 Rudy Domek

## 2020-05-09 ENCOUNTER — Other Ambulatory Visit: Payer: Self-pay

## 2020-05-09 ENCOUNTER — Telehealth: Payer: Self-pay

## 2020-05-09 DIAGNOSIS — K922 Gastrointestinal hemorrhage, unspecified: Secondary | ICD-10-CM | POA: Diagnosis present

## 2020-05-09 DIAGNOSIS — Z7901 Long term (current) use of anticoagulants: Secondary | ICD-10-CM | POA: Diagnosis not present

## 2020-05-09 DIAGNOSIS — K920 Hematemesis: Secondary | ICD-10-CM | POA: Diagnosis not present

## 2020-05-09 DIAGNOSIS — I10 Essential (primary) hypertension: Secondary | ICD-10-CM | POA: Diagnosis not present

## 2020-05-09 DIAGNOSIS — I4891 Unspecified atrial fibrillation: Secondary | ICD-10-CM | POA: Diagnosis not present

## 2020-05-09 LAB — BASIC METABOLIC PANEL
Anion gap: 7 (ref 5–15)
BUN: 14 mg/dL (ref 8–23)
CO2: 25 mmol/L (ref 22–32)
Calcium: 8.3 mg/dL — ABNORMAL LOW (ref 8.9–10.3)
Chloride: 104 mmol/L (ref 98–111)
Creatinine, Ser: 0.53 mg/dL (ref 0.44–1.00)
GFR, Estimated: >60 mL/min (ref >60)
Glucose, Bld: 93 mg/dL (ref 70–99)
Potassium: 3.7 mmol/L (ref 3.5–5.1)
Sodium: 136 mmol/L (ref 135–145)

## 2020-05-09 LAB — CBC
HCT: 21.5 % — ABNORMAL LOW (ref 36.0–46.0)
HCT: 23.7 % — ABNORMAL LOW (ref 36.0–46.0)
Hemoglobin: 7.1 g/dL — ABNORMAL LOW (ref 12.0–15.0)
Hemoglobin: 7.7 g/dL — ABNORMAL LOW (ref 12.0–15.0)
MCH: 34.3 pg — ABNORMAL HIGH (ref 26.0–34.0)
MCH: 34.2 pg — ABNORMAL HIGH (ref 26.0–34.0)
MCHC: 33 g/dL (ref 30.0–36.0)
MCHC: 32.5 g/dL (ref 30.0–36.0)
MCV: 103.9 fL — ABNORMAL HIGH (ref 80.0–100.0)
MCV: 105.3 fL — ABNORMAL HIGH (ref 80.0–100.0)
Platelets: 272 10*3/uL (ref 150–400)
Platelets: 313 10*3/uL (ref 150–400)
RBC: 2.07 MIL/uL — ABNORMAL LOW (ref 3.87–5.11)
RBC: 2.25 MIL/uL — ABNORMAL LOW (ref 3.87–5.11)
RDW: 13.6 % (ref 11.5–15.5)
RDW: 13.6 % (ref 11.5–15.5)
WBC: 8.9 10*3/uL (ref 4.0–10.5)
WBC: 12.3 10*3/uL — ABNORMAL HIGH (ref 4.0–10.5)
nRBC: 0 % (ref 0.0–0.2)
nRBC: 0 % (ref 0.0–0.2)

## 2020-05-09 LAB — MAGNESIUM: Magnesium: 2 mg/dL (ref 1.7–2.4)

## 2020-05-09 LAB — SURGICAL PATHOLOGY

## 2020-05-09 MED ORDER — ONDANSETRON HCL 4 MG PO TABS: 4.0000 mg | ORAL_TABLET | Freq: Four times a day (QID) | ORAL | 0 refills | Status: DC | PRN

## 2020-05-09 MED ORDER — LIDOCAINE VISCOUS HCL 2 % MT SOLN
OROMUCOSAL | Status: AC
  Filled 2020-05-09: qty 15

## 2020-05-09 MED ORDER — AMLODIPINE BESYLATE 10 MG PO TABS
10.0000 mg | ORAL_TABLET | Freq: Every day | ORAL | 3 refills | Status: DC
Start: 2020-05-09 — End: 2023-12-11

## 2020-05-09 MED ORDER — PANTOPRAZOLE SODIUM 40 MG PO TBEC: 40.0000 mg | DELAYED_RELEASE_TABLET | Freq: Two times a day (BID) | ORAL | 1 refills | Status: DC

## 2020-05-09 MED ORDER — ALBUTEROL SULFATE HFA 108 (90 BASE) MCG/ACT IN AERS: 2.0000 | INHALATION_SPRAY | RESPIRATORY_TRACT | 2 refills | Status: DC | PRN

## 2020-05-09 MED ORDER — LISINOPRIL 30 MG PO TABS
30.0000 mg | ORAL_TABLET | Freq: Every morning | ORAL | 3 refills | Status: DC
Start: 2020-05-14 — End: 2022-11-03

## 2020-05-09 NOTE — Care Management CC44 (Signed)
Condition Code 44 Documentation Completed  Patient Details  Name: April Guerra MRN: [id number] Date of Birth: 1946/05/18   Condition Code 44 given:  Yes Patient signature on Condition Code 44 notice:  Yes Documentation of 2 MD's agreement:  Yes Code 44 added to claim:  Yes    Natasha Bence, LCSW 05/09/2020, 4:31 PM

## 2020-05-09 NOTE — Care Management Obs Status (Signed)
[facility] NOTIFICATION   Patient Details  Name: April Guerra MRN: [id number] Date of Birth: 12-05-1945   Medicare Observation Status Notification Given:  Yes    Natasha Bence, LCSW 05/09/2020, 4:29 PM

## 2020-05-09 NOTE — Discharge Summary (Signed)
April Guerra, is a 74 y.o. female  DOB 1945/08/07  MRN [id number].  Admission date:  05/08/2020  Admitting Physician  Lynne Takemoto Denton Brick, MD  Discharge Date:  05/09/2020   Primary MD  Rory Percy, MD  Recommendations for primary care physician for things to follow:   1)Please stop Lovenox/enoxaparin injections and also stop Eliquis/apixaban pills until you are seen by Dr. Denman George for your postop visit on May 24, 2020 2) please repeat CBC blood test with Dr. Olevia Perches office in the next 4 to 6 days 3)Avoid ibuprofen/Advil/Aleve/Motrin/Goody Powders/Naproxen/BC powders/Meloxicam/Diclofenac/Indomethacin and other Nonsteroidal anti-inflammatory medications as these will make you more likely to bleed and can cause stomach ulcers, can also cause Kidney problems.  4) hold lisinopril until Monday, Okay to restart it around Monday, 05/14/2020 if blood pressure stable  Admission Diagnosis  Rectal bleeding [K62.5] Acute upper GI bleed [K92.2] Acute GI bleeding [K92.2]   Discharge Diagnosis  Rectal bleeding [K62.5] Acute upper GI bleed [K92.2] Acute GI bleeding [K92.2]    Principal Problem:   Acute upper GI bleed Active Problems:   Pseudoaneurysm of femoral artery (HCC)   Chronic anticoagulation   Elevated CA-125   Right ovarian epithelial cancer (HCC)   Melena   Hematemesis   Hypertension   Hypercholesteremia   A-fib (HCC)   Acute GI bleeding      Past Medical History:  Diagnosis Date  . A-fib ([place])   . Anxiety   . Arthritis   . Asthma    hx of   . Dyspnea    with exertion   . H/O seasonal allergies   . Heart murmur    as aa child   . Hypercholesteremia   . Hypertension     Past Surgical History:  Procedure Laterality Date  . BREAST SURGERY    . CARDIAC CATHETERIZATION    . CHOLECYSTECTOMY    . COLONOSCOPY N/A 08/14/2015   Procedure: COLONOSCOPY;  Surgeon: Aviva Signs, MD;   Location: [facility];  Service: Gastroenterology;  Laterality: N/A;  . ROBOTIC ASSISTED TOTAL HYSTERECTOMY WITH BILATERAL SALPINGO OOPHERECTOMY N/A 05/03/2020   Procedure: XI ROBOTIC ASSISTED TOTAL HYSTERECTOMY WITH BILATERAL SALPINGO OOPHORECTOMY, OMENTECTOMY,RADICAL TUMOR DEBULKING, RIGID PROSTOSCOPY;  Surgeon: Everitt Amber, MD;  Location: [facility];  Service: Gynecology;  Laterality: N/A;  . THROMBECTOMY FEMORAL ARTERY Right 12/17/2013   Procedure: REPAIR OF RIGHT FEMORAL ARTERY PSEUDOANEURYSM;  Surgeon: Elam Dutch, MD;  Location: [facility];  Service: Vascular;  Laterality: Right;      HPI  from the history and physical done on the day of admission:   Chief Complaint: GI bleed  HPI: April Guerra is a 74 y.o. female with medical history significant chronic atrial fibrillation on apixaban who recently had a total hysterectomy done for ovarian cancer had been off apixaban for more than 10 days and was on Lovenox 40 mg daily injections for DVT prophylaxis post surgery, she has hypertension tension hyperlipidemia asthma osteoarthritis and generalized anxiety disorder.  She has been doing fairly well postop until she  started noticing nausea and stomach upset and having 1 black tarry stool.  She started having emesis with bright red blood and bile was also seen.  She takes Pepcid 40 mg daily for acid reflux symptoms.  ED Course: Patient arrived with good vital signs that were stable and she was given Zofran and IV Protonix and IV fluids.  Her hemoglobin was down to 9.4 from 12 prior to surgery.  She was noted to be fecal occult positive.  She was evaluated by the GI service and she will need to have upper endoscopy performed and further observation for need for transfusion as she is actively bleeding.    Review of Systems: As per HPI otherwise 10 point review of systems negative.    Hospital Course:       1)Acute upper GI bleeding -EGD on 05/08/2020 showed  1 cm nonbleeding ulcer without  central eschar, presence of heaped up borders.  Tissue was friable.  Biopsies were taken from the edges of the ulcer and from antrum and body to rule out H. pylori.  There were also presence of coin shaped erythematous lesions in the gastric body without any evidence of active bleeding or ulceration.  Otherwise, esophagus and duodenum were within normal limits. -Treated with IV Protonix and discharged on p.o. Protonix -Avoid NSAIDs -Patient advised to stop Lovenox/enoxaparin injections and also stop Eliquis/apixaban pills until she is  seen by Dr. Denman George for postop visit on May 24, 2020  repeat CBC blood test with Dr. Olevia Perches office in the next 4 to 6 days advised   2)Post op s/p total hysterectomy - holding lovenox for now.  She was supposed to be able to start back on apixaban on 11/19 from her original postop plan.   -Follow-up with gynecologist Dr. Denman George on 05/24/2020  3)Chronic Atrial fibrillation - temporarily holding anticoagulation during work up for GI bleed.  -Please see #1 above regarding need to hold anticoagulation  Discharge Condition: Stable  Follow UP--- GI physician Dr Laural Golden and GYN oncologist Dr. Denman George   Consults obtained - Gi  Diet and Activity recommendation:  As advised  Discharge Instructions    Discharge Instructions    Call MD for:  difficulty breathing, headache or visual disturbances   Complete by: As directed    Call MD for:  persistant dizziness or light-headedness   Complete by: As directed    Call MD for:  persistant nausea and vomiting   Complete by: As directed    Call MD for:  severe uncontrolled pain   Complete by: As directed    Call MD for:  temperature >100.4   Complete by: As directed    Diet - low sodium heart healthy   Complete by: As directed    Discharge instructions   Complete by: As directed    1) please stop Lovenox/enoxaparin injections and also stop Eliquis/apixaban pills until you are seen by Dr. Denman George for your postop visit on  May 24, 2020 2) please repeat CBC blood test with Dr. Olevia Perches office in the next 4 to 6 days 3)Avoid ibuprofen/Advil/Aleve/Motrin/Goody Powders/Naproxen/BC powders/Meloxicam/Diclofenac/Indomethacin and other Nonsteroidal anti-inflammatory medications as these will make you more likely to bleed and can cause stomach ulcers, can also cause Kidney problems.  4) hold lisinopril until Monday, Okay to restart it around Monday, 05/14/2020 if blood pressure stable   Increase activity slowly   Complete by: As directed         Discharge Medications     Allergies as of 05/09/2020  Reactions   Morphine And Related Nausea And Vomiting      Medication List    STOP taking these medications   Eliquis 5 MG Tabs tablet Generic drug: apixaban   enoxaparin 40 MG/0.4ML injection Commonly known as: LOVENOX   famotidine 40 MG tablet Commonly known as: PEPCID     TAKE these medications   acetaminophen 500 MG tablet Commonly known as: TYLENOL Take 1,000 mg by mouth every 6 (six) hours as needed for moderate pain.   albuterol 108 (90 Base) MCG/ACT inhaler Commonly known as: VENTOLIN HFA Inhale 2 puffs into the lungs every 4 (four) hours as needed for wheezing or shortness of breath.   amLODipine 10 MG tablet Commonly known as: NORVASC Take 1 tablet (10 mg total) by mouth daily with lunch. For BP What changed: additional instructions   CALCIUM 600/VITAMIN D PO Take 1 tablet by mouth in the morning and at bedtime.   CENTRUM SILVER ADULT 50+ PO Take 1 tablet by mouth daily.   cetirizine 10 MG tablet Commonly known as: ZYRTEC Take 10 mg by mouth daily.   CoQ10 100 MG Caps Take 100 mg by mouth at bedtime.   cyclobenzaprine 10 MG tablet Commonly known as: FLEXERIL Take 5 mg by mouth daily as needed for muscle spasms.   diltiazem 30 MG tablet Commonly known as: Cardizem Take 1 tablet every 4 hours AS NEEDED for AFIB heart rate >100 What changed:   how much to take  how to  take this  when to take this  reasons to take this  additional instructions   fluticasone 50 MCG/ACT nasal spray Commonly known as: FLONASE Place 1 spray into both nostrils daily.   lisinopril 30 MG tablet Commonly known as: ZESTRIL Take 1 tablet (30 mg total) by mouth in the morning. Okay to restart around Monday, 05/14/2020 if blood pressure stable Start taking on: May 14, 2020 What changed:   additional instructions  These instructions start on May 14, 2020. If you are unsure what to do until then, ask your doctor or other care provider.   Magnesium 100 MG Tabs Take 50 mg by mouth at bedtime as needed (cramps).   melatonin 5 MG Tabs Take 2.5 mg by mouth at bedtime.   metoprolol succinate 100 MG 24 hr tablet Commonly known as: TOPROL-XL Take 100 mg by mouth in the morning. Take with or immediately following a meal.   ondansetron 4 MG tablet Commonly known as: ZOFRAN Take 1 tablet (4 mg total) by mouth every 6 (six) hours as needed for nausea.   oxyCODONE 5 MG immediate release tablet Commonly known as: Oxy IR/ROXICODONE Take 1 tablet (5 mg total) by mouth every 6 (six) hours as needed for severe pain. For AFTER surgery only, start with 0.5 tablet, do not take and drive   pantoprazole 40 MG tablet Commonly known as: Protonix Take 1 tablet (40 mg total) by mouth 2 (two) times daily before a meal.   senna 8.6 MG tablet Commonly known as: SENOKOT Take 1 tablet by mouth in the morning.   simvastatin 40 MG tablet Commonly known as: ZOCOR Take 0.5 tablets (20 mg total) by mouth daily. What changed:   how much to take  when to take this   sodium chloride 0.65 % Soln nasal spray Commonly known as: OCEAN Place 1 spray into both nostrils daily.   vitamin C 500 MG tablet Commonly known as: ASCORBIC ACID Take 500 mg by mouth daily.   Vitamin D  125 MCG (5000 UT) Caps Take 5,000 Units by mouth daily.      Major procedures and Radiology Reports -  PLEASE review detailed and final reports for all details, in brief -  US PELVIC COMPLETE WITH TRANSVAGINAL  Result Date: 04/12/2020 CLINICAL DATA:  RIGHT lower quadrant pain, abnormal CT demonstrating a RIGHT pelvic mass; postmenopausal EXAM: TRANSABDOMINAL AND TRANSVAGINAL ULTRASOUND OF PELVIS TECHNIQUE: Both transabdominal and transvaginal ultrasound examinations of the pelvis were performed. Transabdominal technique was performed for global imaging of the pelvis including uterus, ovaries, adnexal regions, and pelvic cul-de-sac. It was necessary to proceed with endovaginal exam following the transabdominal exam to visualize the uterus, endometrium, and ovaries, and to characterize a cystic mass. COMPARISON:  CT abdomen and pelvis 04/11/2020 FINDINGS: Uterus Measurements: 7.2 x 3.2 x 4.6 cm = volume: 55 mL. Mildly retroflexed. Heterogeneous myometrium. No focal mass Endometrium Thickness: 6 mm.  No endometrial fluid or focal abnormality Right ovary No normal appearing RIGHT ovary visualized, see below Left ovary No normal appearing LEFT ovary visualized, see below Other findings Large complex cystic mass identified filling the pelvis, 14.5 x 10.9 x 13.4 cm. Majority of the lesion is a complicated cystic locule which contains diffuse low level internal echoes and minimal wall irregularity. Inferiorly, a complex solid and cystic intracystic mass is identified, estimated 5.7 x 6.5 x 5.0 cm. Multiple septations are seen of varying thickness with a large area of mural nodularity. Blood flow seen within the mural nodule. Appearance is consistent with an ovarian neoplasm, suspect of RIGHT ovarian origin based on position on prior CT. No normal appearing ovarian tissue is visualized on either side. No free pelvic fluid. IMPRESSION: Large complicated cystic lesion within the pelvis 14.5 x 10.9 x 13.4 cm containing septations, scattered internal echoes, and a large mural nodule. Lesion is most consistent with an ovarian  neoplasm, favor RIGHT ovarian origin. Unremarkable uterus and endometrial complex. Neither ovary is visualized. These results will be called to the ordering clinician or representative by the Radiologist Assistant, and communication documented in the PACS or [employer]. Electronically Signed   By: Lavonia Dana M.D.   On: 04/12/2020 16:25    Micro Results   Recent Results (from the past 240 hour(s))  SARS CORONAVIRUS 2 (TAT 6-24 HRS) Nasopharyngeal Nasopharyngeal Swab     Status: None   Collection Time: 04/30/20 10:23 AM   Specimen: Nasopharyngeal Swab  Result Value Ref Range Status   SARS Coronavirus 2 NEGATIVE NEGATIVE Final    Comment: (NOTE) SARS-CoV-2 target nucleic acids are NOT DETECTED.  The SARS-CoV-2 RNA is generally detectable in upper and lower respiratory specimens during the acute phase of infection. Negative results do not preclude SARS-CoV-2 infection, do not rule out co-infections with other pathogens, and should not be used as the sole basis for treatment or other patient management decisions. Negative results must be combined with clinical observations, patient history, and epidemiological information. The expected result is Negative.  Fact Sheet for Patients: SugarRoll.be  Fact Sheet for Healthcare Providers: https://www.woods-mathews.com/  This test is not yet approved or cleared by the [place] FDA and  has been authorized for detection and/or diagnosis of SARS-CoV-2 by FDA under an Emergency Use Authorization (EUA). This EUA will remain  in effect (meaning this test can be used) for the duration of the COVID-19 declaration under Se ction 564(b)(1) of the Act, 21 U.S.C. section 360bbb-3(b)(1), unless the authorization is terminated or revoked sooner.  Performed at [facility], [place]  [address] [place], [place] [address]   Respiratory Panel by RT PCR (Flu A&B, Covid) - Nasopharyngeal Swab     Status:  None   Collection Time: 05/08/20  8:36 AM   Specimen: Nasopharyngeal Swab  Result Value Ref Range Status   SARS Coronavirus 2 by RT PCR NEGATIVE NEGATIVE Final    Comment: (NOTE) SARS-CoV-2 target nucleic acids are NOT DETECTED.  The SARS-CoV-2 RNA is generally detectable in upper respiratoy specimens during the acute phase of infection. The lowest concentration of SARS-CoV-2 viral copies this assay can detect is 131 copies/mL. A negative result does not preclude SARS-Cov-2 infection and should not be used as the sole basis for treatment or other patient management decisions. A negative result may occur with  improper specimen collection/handling, submission of specimen other than nasopharyngeal swab, presence of viral mutation(s) within the areas targeted by this assay, and inadequate number of viral copies (<131 copies/mL). A negative result must be combined with clinical observations, patient history, and epidemiological information. The expected result is Negative.  Fact Sheet for Patients:  PinkCheek.be  Fact Sheet for Healthcare Providers:  GravelBags.it  This test is no t yet approved or cleared by the [place] FDA and  has been authorized for detection and/or diagnosis of SARS-CoV-2 by FDA under an Emergency Use Authorization (EUA). This EUA will remain  in effect (meaning this test can be used) for the duration of the COVID-19 declaration under Section 564(b)(1) of the Act, 21 U.S.C. section 360bbb-3(b)(1), unless the authorization is terminated or revoked sooner.     Influenza A by PCR NEGATIVE NEGATIVE Final   Influenza B by PCR NEGATIVE NEGATIVE Final    Comment: (NOTE) The Xpert Xpress SARS-CoV-2/FLU/RSV assay is intended as an aid in  the diagnosis of influenza from Nasopharyngeal swab specimens and  should not be used as a sole basis for treatment. Nasal washings and  aspirates are unacceptable for  Xpert Xpress SARS-CoV-2/FLU/RSV  testing.  Fact Sheet for Patients: PinkCheek.be  Fact Sheet for Healthcare Providers: GravelBags.it  This test is not yet approved or cleared by the [place] FDA and  has been authorized for detection and/or diagnosis of SARS-CoV-2 by  FDA under an Emergency Use Authorization (EUA). This EUA will remain  in effect (meaning this test can be used) for the duration of the  Covid-19 declaration under Section 564(b)(1) of the Act, 21  U.S.C. section 360bbb-3(b)(1), unless the authorization is  terminated or revoked. Performed at [facility], [address] [place], [place] [address]      Today   Subjective    April Guerra today has no new complaints, no further bleeding concerns -Tolerating oral intake well          Patient has been seen and examined prior to discharge   Objective   Blood pressure 124/62, pulse 79, temperature 99.6 F (37.6 C), resp. rate 20, height 4\' 10"  (1.473 m), weight 72.6 kg, SpO2 100 %.   Intake/Output Summary (Last 24 hours) at 05/09/2020 1720 Last data filed at 05/09/2020 0900 Gross per 24 hour  Intake 480 ml  Output --  Net 480 ml   Exam Gen:- Awake Alert, no acute distress  HEENT:- [place].AT, No sclera icterus Neck-Supple Neck,No JVD,.  Lungs-  CTAB , good air movement bilaterally  CV- S1, S2 normal, regular Abd-  +ve B.Sounds, Abd Soft, No tenderness, significant bruising and ecchymosis of anterior abdominal wall and suprapubic area presumably from recent GYN surgery and subcu Lovenox injection PTA  Extremity/Skin:- No  edema,   good pulses Psych-affect is appropriate, oriented x3 Neuro-no new focal deficits, no tremors    Data Review   CBC w Diff:  Lab Results  Component Value Date   WBC 12.3 (H) 05/09/2020   HGB 7.7 (L) 05/09/2020   HCT 23.7 (L) 05/09/2020   PLT 313 05/09/2020   LYMPHOPCT 13 05/08/2020   MONOPCT 7 05/08/2020   EOSPCT 1  05/08/2020   BASOPCT 0 05/08/2020    CMP:  Lab Results  Component Value Date   NA 136 05/09/2020   K 3.7 05/09/2020   CL 104 05/09/2020   CO2 25 05/09/2020   BUN 14 05/09/2020   CREATININE 0.53 05/09/2020   PROT 6.8 05/08/2020   ALBUMIN 3.8 05/08/2020   BILITOT 0.8 05/08/2020   ALKPHOS 46 05/08/2020   AST 62 (H) 05/08/2020   ALT 62 (H) 05/08/2020  .   Total Discharge time is about 33 minutes  Roxan Hockey M.D on 05/09/2020 at 5:20 PM  Go to [email or web address] -  for contact info  [facility] - Office  [phone]

## 2020-05-09 NOTE — Telephone Encounter (Signed)
Told daughter that Dr. Denman George is aware of the admission and is following the treatment plan in the epic system.

## 2020-05-09 NOTE — Progress Notes (Signed)
GI Inpatient Follow-up Note  Subjective: daughter at bedside and helps w/ hx. Has had 5-6 black tarry stools over past 12 hours, no change in frequency from yesterday. Tolerated popcicle, broth and juice for breakfast. Denies nausea/vomiting. Only has incisional abd pain. Reports taking miralax prior to admission but none since. Denies nsaid use prior to admission. Non smoker. No alcohol.   Scheduled Inpatient Medications:  . amLODipine  10 mg Oral Q lunch  . atorvastatin  20 mg Oral Daily  . fluticasone  1 spray Each Nare Daily  . metoprolol succinate  100 mg Oral q AM  . pantoprazole (PROTONIX) IV  40 mg Intravenous Q12H  . sodium chloride  1 spray Each Nare Daily    Continuous Inpatient Infusions:    PRN Inpatient Medications:  acetaminophen **OR** acetaminophen, albuterol, ondansetron **OR** ondansetron (ZOFRAN) IV  Review of Systems: Constitutional: Weight is stable.  Eyes: No changes in vision. ENT: No oral lesions, sore throat.  GI: see HPI.  Heme/Lymph: No easy bruising.  CV: No chest pain.  GU: No hematuria.  Integumentary: No rashes.  Neuro: No headaches.  Psych: No depression/anxiety.  Endocrine: No heat/cold intolerance.  Allergic/Immunologic: No urticaria.  Resp: No cough, SOB.  Musculoskeletal: No joint swelling.    Physical Examination: BP 124/62 (BP Location: Right Arm)   Pulse 79   Temp 99.6 F (37.6 C)   Resp 20   Ht 4\' 10"  (1.473 m)   Wt 72.6 kg   SpO2 100%   BMI 33.44 kg/m  Gen: NAD, alert and oriented x 4 HEENT: PEERLA, EOMI, Neck: supple, no JVD or thyromegaly Chest: CTA bilaterally, no wheezes, crackles, or other adventitious sounds CV: RRR, no m/g/c/r Abd: soft, severe abd bruising lower abd, well healing abd scars, non tender, active bowel sounds.  Ext: no edema, well perfused with 2+ pulses, Skin: no rash or lesions noted Lymph: no LAD  Data: Lab Results  Component Value Date   WBC 8.9 05/09/2020   HGB 7.1 (L) 05/09/2020   HCT  21.5 (L) 05/09/2020   MCV 103.9 (H) 05/09/2020   PLT 272 05/09/2020   Recent Labs  Lab 05/08/20 0747 05/09/20 0626  HGB 9.4* 7.1*   Lab Results  Component Value Date   NA 136 05/09/2020   K 3.7 05/09/2020   CL 104 05/09/2020   CO2 25 05/09/2020   BUN 14 05/09/2020   CREATININE 0.53 05/09/2020   Lab Results  Component Value Date   ALT 62 (H) 05/08/2020   AST 62 (H) 05/08/2020   ALKPHOS 46 05/08/2020   BILITOT 0.8 05/08/2020   Recent Labs  Lab 05/08/20 0747  INR 1.1   Assessment/Plan: Ms. Blizard is a 74 y.o. female  past medical history of atrial fibrillation on Eliquis (stopped 2 weeks ago), hypertension, hyperlipidemia, asthma, anxiety, arthritis, and recent resection of 15cm ovarian tumor on 05/03/2020 (underwent robotic assisted total hysterectomy and bilateral salpingo-oophorectomy) admitted on 05/08/20 for 1 episode of both coffee ground emesis w/ scant fresh blood and melena x 1 associated w/ lightheadedness.    1. GI Bleed - Hgb 9.4 on admission (baseline 14). EGD showed 1cm non bleeding ulcer without central eschar, presence of heaped up borders, friable tissue, and coin shaped erythematous lesions in the gastric body without any evidence of active bleeding. Recommended to continue PPI BID IV, Hgb dropped from 9.4 yesterday to 7.1 this AM with continued black tarry stools. Last colonoscopy 2017 with diverticulosis   Recommendations: -Repeat H/H now  -  Consider bleeding scan or CT-A this afternoon pending H/H repeat -Following pending H pylori test & EGD path results -Continue PPI BID in interim  Case discussed with Dr Laural Golden Please call with questions or concerns.    Ronney Asters, PA-C [facility]

## 2020-05-09 NOTE — Discharge Instructions (Signed)
1) please stop Lovenox/enoxaparin injections and also stop Eliquis/apixaban pills until you are seen by Dr. Denman Guerra for your postop visit on May 24, 2020 2) please repeat CBC blood test with Dr. Olevia Perches office in the next 4 to 6 days 3)Avoid ibuprofen/Advil/Aleve/Motrin/Goody Powders/Naproxen/BC powders/Meloxicam/Diclofenac/Indomethacin and other Nonsteroidal anti-inflammatory medications as these will make you more likely to bleed and can cause stomach ulcers, can also cause Kidney problems.  4) hold lisinopril until Monday, Okay to restart it around Monday, 05/14/2020 if blood pressure stable

## 2020-05-09 NOTE — Telephone Encounter (Signed)
April Guerra stated that she is not vomiting.  She is still just taking in mostly liquids. She has had 6 tarry stools today. Urinating well. She had orthostatic VS which looked good.  She does not feel dizzy. CBC will be repeated ~2 pm today. She has been up ambulating. Her incisions are D&I.  She has a lot of diffuse bruising on her abdomen.  Some of the bruising has settled in the lower abdomen.  The bruising is turning to yellow in some areas. Her abdomen is soft. The LUQ incisional area is sore. Pain is 3-4/10.  Told her she could use tylenol for discomfort.  Told her that the robot arm in  left upper incision does a lot of the work during surgery.  Told her Dr. Denman George said no more Lovenox.  Dr.Rossi recommends restarting Her Lovenox 1 month after surgery which would be 05-31-20. Dr. Denman George will discuss this further at her post op visit on 05-24-20.  April Guerra would like to have chemotherapy at [facility] since it is closer to home.  Told her that this office will arrange the appointment and call her with the information.

## 2020-05-09 NOTE — TOC Transition Note (Signed)
Transition of Care [facility]) - CM/SW Discharge Note   Patient Details  Name: April Guerra MRN: [id number] Date of Birth: 1946/05/09  [facility] CM/SW Contact:  Natasha Bence, LCSW Phone Number: 05/09/2020, 5:39 PM   Clinical Narrative:    Code 44 delivered. Patient is medically stable and cleared for discharge. No additional TOC needs. TOC signing off.         Patient Goals and CMS Choice        Discharge Placement                       Discharge Plan and Services                                     Social Determinants of Health (SDOH) Interventions     Readmission Risk Interventions No flowsheet data found.

## 2020-05-10 ENCOUNTER — Encounter: Payer: Self-pay | Admitting: Oncology

## 2020-05-10 DIAGNOSIS — C561 Malignant neoplasm of right ovary: Secondary | ICD-10-CM

## 2020-05-10 LAB — H. PYLORI ANTIBODY, IGG: H Pylori IgG: 0.62 Index Value (ref 0.00–0.79)

## 2020-05-12 ENCOUNTER — Emergency Department (HOSPITAL_COMMUNITY)
Admission: EM | Admit: 2020-05-12 | Discharge: 2020-05-12 | Disposition: A | Discharge status: Home / Self Care | Payer: Medicare Other | Attending: Emergency Medicine | Admitting: Emergency Medicine

## 2020-05-12 ENCOUNTER — Encounter (HOSPITAL_COMMUNITY): Payer: Self-pay

## 2020-05-12 ENCOUNTER — Other Ambulatory Visit: Payer: Self-pay

## 2020-05-12 DIAGNOSIS — I10 Essential (primary) hypertension: Secondary | ICD-10-CM | POA: Diagnosis not present

## 2020-05-12 DIAGNOSIS — S301XXA Contusion of abdominal wall, initial encounter: Secondary | ICD-10-CM | POA: Insufficient documentation

## 2020-05-12 DIAGNOSIS — J45909 Unspecified asthma, uncomplicated: Secondary | ICD-10-CM | POA: Insufficient documentation

## 2020-05-12 DIAGNOSIS — Z79899 Other long term (current) drug therapy: Secondary | ICD-10-CM | POA: Insufficient documentation

## 2020-05-12 DIAGNOSIS — Z7951 Long term (current) use of inhaled steroids: Secondary | ICD-10-CM | POA: Insufficient documentation

## 2020-05-12 DIAGNOSIS — D649 Anemia, unspecified: Secondary | ICD-10-CM | POA: Insufficient documentation

## 2020-05-12 DIAGNOSIS — X58XXXA Exposure to other specified factors, initial encounter: Secondary | ICD-10-CM | POA: Insufficient documentation

## 2020-05-12 DIAGNOSIS — R531 Weakness: Secondary | ICD-10-CM | POA: Diagnosis not present

## 2020-05-12 DIAGNOSIS — Z8543 Personal history of malignant neoplasm of ovary: Secondary | ICD-10-CM | POA: Diagnosis not present

## 2020-05-12 HISTORY — DX: Malignant (primary) neoplasm, unspecified: C80.1

## 2020-05-12 LAB — COMPREHENSIVE METABOLIC PANEL
ALT: 37 U/L (ref 0–44)
AST: 29 U/L (ref 15–41)
Albumin: 3.8 g/dL (ref 3.5–5.0)
Alkaline Phosphatase: 44 U/L (ref 38–126)
Anion gap: 7 (ref 5–15)
BUN: 8 mg/dL (ref 8–23)
CO2: 26 mmol/L (ref 22–32)
Calcium: 9.2 mg/dL (ref 8.9–10.3)
Chloride: 103 mmol/L (ref 98–111)
Creatinine, Ser: 0.51 mg/dL (ref 0.44–1.00)
GFR, Estimated: >60 mL/min (ref >60)
Glucose, Bld: 97 mg/dL (ref 70–99)
Potassium: 4.1 mmol/L (ref 3.5–5.1)
Sodium: 136 mmol/L (ref 135–145)
Total Bilirubin: 0.6 mg/dL (ref 0.3–1.2)
Total Protein: 6.5 g/dL (ref 6.5–8.1)

## 2020-05-12 LAB — CBC WITH DIFFERENTIAL/PLATELET
Abs Immature Granulocytes: 0.27 10*3/uL — ABNORMAL HIGH (ref 0.00–0.07)
Basophils Absolute: 0.1 10*3/uL (ref 0.0–0.1)
Basophils Relative: 1 %
Eosinophils Absolute: 0.3 10*3/uL (ref 0.0–0.5)
Eosinophils Relative: 3 %
HCT: 22.9 % — ABNORMAL LOW (ref 36.0–46.0)
Hemoglobin: 7.6 g/dL — ABNORMAL LOW (ref 12.0–15.0)
Immature Granulocytes: 3 %
Lymphocytes Relative: 18 %
Lymphs Abs: 1.7 10*3/uL (ref 0.7–4.0)
MCH: 35.2 pg — ABNORMAL HIGH (ref 26.0–34.0)
MCHC: 33.2 g/dL (ref 30.0–36.0)
MCV: 106 fL — ABNORMAL HIGH (ref 80.0–100.0)
Monocytes Absolute: 1.1 10*3/uL — ABNORMAL HIGH (ref 0.1–1.0)
Monocytes Relative: 11 %
Neutro Abs: 6 10*3/uL (ref 1.7–7.7)
Neutrophils Relative %: 64 %
Platelets: 367 10*3/uL (ref 150–400)
RBC: 2.16 MIL/uL — ABNORMAL LOW (ref 3.87–5.11)
RDW: 14.6 % (ref 11.5–15.5)
WBC: 9.4 10*3/uL (ref 4.0–10.5)
nRBC: 0.3 % — ABNORMAL HIGH (ref 0.0–0.2)

## 2020-05-12 LAB — TYPE AND SCREEN
ABO/RH(D): O POS
Antibody Screen: NEGATIVE

## 2020-05-12 LAB — URINALYSIS, ROUTINE W REFLEX MICROSCOPIC
Bilirubin Urine: NEGATIVE
Glucose, UA: NEGATIVE mg/dL
Hgb urine dipstick: NEGATIVE
Ketones, ur: NEGATIVE mg/dL
Leukocytes,Ua: NEGATIVE
Nitrite: NEGATIVE
Protein, ur: NEGATIVE mg/dL
Specific Gravity, Urine: 1.001 — ABNORMAL LOW (ref 1.005–1.030)
pH: 7 (ref 5.0–8.0)

## 2020-05-12 NOTE — ED Notes (Signed)
Ambulate to Br with help, tolerated well.

## 2020-05-12 NOTE — Discharge Instructions (Addendum)
Your testing today was reassuring, your hemoglobin level was 7.6 which is essentially unchanged from when you were in the hospital.  If you should develop increasing abdominal pain nausea or bleeding please return immediately.  You may let your gastroenterologist know about your blood draw so that they do not have to recheck it on Monday.  Please continue the Protonix  Rest and fluids

## 2020-05-12 NOTE — ED Provider Notes (Signed)
[facility] EMERGENCY DEPARTMENT Provider Note   CSN: [id number] Arrival date & time: 05/12/20  1020     History Chief Complaint  Patient presents with  . Weakness    April Guerra is a 74 y.o. female.  HPI   Pt was admitted to hospitalist - had hysterectomy with ovaries removed - found to have CA on the R ovary - had post op course complicated by some bleeding - specifically GI bleeding - had PUD with bleeding ulcer on EGD, stopped spontaneously and the pt had improved BM's - with no more melena - no more vomiting blood - she had hgb that trended up and was able to be d/c without transfusion and without intervention other than Protonix bid.  She now feels generalized weakness, she is not vertiginous but has thought her hgb may be lower given her sx - no SOB, no CP - abd pain is gradually improving and her bruising on the abdomina is improving.    Sx are mild - intermittent - not worse with standing - no urinary sx - no GI bleeding that she has seen.    Hemoglobin  Date Value Ref Range Status  05/12/2020 7.6 (L) 12.0 - 15.0 g/dL Final  05/09/2020 7.7 (L) 12.0 - 15.0 g/dL Final  05/09/2020 7.1 (L) 12.0 - 15.0 g/dL Final  05/08/2020 9.4 (L) 12.0 - 15.0 g/dL Final     Past Medical History:  Diagnosis Date  . A-fib ([place])   . Anxiety   . Arthritis   . Asthma    hx of   . Cancer ([place])    ovarian  . Dyspnea    with exertion   . H/O seasonal allergies   . Heart murmur    as aa child   . Hypercholesteremia   . Hypertension     Patient Active Problem List   Diagnosis Date Noted  . Acute GI bleeding 05/09/2020  . Acute upper GI bleed 05/08/2020  . Melena 05/08/2020  . Hematemesis 05/08/2020  . Hypertension   . Hypercholesteremia   . A-fib ([place])   . Anemia   . Elevated CA-125 05/03/2020  . Right ovarian epithelial cancer ([place])   . Pelvic mass 04/20/2020  . Postmenopausal bleeding 04/20/2020  . Thickened endometrium 04/20/2020  . Chronic anticoagulation 04/20/2020   . Pseudoaneurysm ([place]) 01/12/2014  . Pseudoaneurysm of femoral artery ([place]) 12/16/2013    Past Surgical History:  Procedure Laterality Date  . BREAST SURGERY    . CARDIAC CATHETERIZATION    . CHOLECYSTECTOMY    . COLONOSCOPY N/A 08/14/2015   Procedure: COLONOSCOPY;  Surgeon: Aviva Signs, MD;  Location: [facility] SUITE;  Service: Gastroenterology;  Laterality: N/A;  . ROBOTIC ASSISTED TOTAL HYSTERECTOMY WITH BILATERAL SALPINGO OOPHERECTOMY N/A 05/03/2020   Procedure: XI ROBOTIC ASSISTED TOTAL HYSTERECTOMY WITH BILATERAL SALPINGO OOPHORECTOMY, OMENTECTOMY,RADICAL TUMOR DEBULKING, RIGID PROSTOSCOPY;  Surgeon: Everitt Amber, MD;  Location: [facility] ORS;  Service: Gynecology;  Laterality: N/A;  . THROMBECTOMY FEMORAL ARTERY Right 12/17/2013   Procedure: REPAIR OF RIGHT FEMORAL ARTERY PSEUDOANEURYSM;  Surgeon: Elam Dutch, MD;  Location: [place];  Service: Vascular;  Laterality: Right;     OB History   No obstetric history on file.     Family History  Problem Relation Age of Onset  . Hypertension Mother   . Heart failure Mother   . Asthma Mother   . Ulcers Mother   . Benign prostatic hyperplasia Father   . Ulcers Father   . Esophageal varices Father   .  Atrial fibrillation Sister   . Atrial fibrillation Brother   . Heart disease Brother   . Colon cancer Neg Hx     Social History   Tobacco Use  . Smoking status: Never Smoker  . Smokeless tobacco: Never Used  Vaping Use  . Vaping Use: Never used  Substance Use Topics  . Alcohol use: No  . Drug use: No    Home Medications Prior to Admission medications   Medication Sig Start Date End Date Taking? Authorizing Provider  acetaminophen (TYLENOL) 500 MG tablet Take 1,000 mg by mouth every 6 (six) hours as needed for moderate pain.     [provider]  albuterol (VENTOLIN HFA) 108 (90 Base) MCG/ACT inhaler Inhale 2 puffs into the lungs every 4 (four) hours as needed for wheezing or shortness of breath. 05/09/20   Roxan Hockey, MD  amLODipine (NORVASC) 10 MG tablet Take 1 tablet (10 mg total) by mouth daily with lunch. For BP 05/09/20   Roxan Hockey, MD  Calcium Carb-Cholecalciferol (CALCIUM 600/VITAMIN D PO) Take 1 tablet by mouth in the morning and at bedtime.    [provider]  cetirizine (ZYRTEC) 10 MG tablet Take 10 mg by mouth daily.    [provider]  Cholecalciferol (VITAMIN D) 125 MCG (5000 UT) CAPS Take 5,000 Units by mouth daily.    [provider]  Coenzyme Q10 (COQ10) 100 MG CAPS Take 100 mg by mouth at bedtime.     [provider]  cyclobenzaprine (FLEXERIL) 10 MG tablet Take 5 mg by mouth daily as needed for muscle spasms.  04/22/19   [provider]  diltiazem (CARDIZEM) 30 MG tablet Take 1 tablet every 4 hours AS NEEDED for AFIB heart rate >100 Patient taking differently: Take 30 mg by mouth every 4 (four) hours as needed (AFIB heart rate > 100).  05/04/18   Sherran Needs, NP  fluticasone (FLONASE) 50 MCG/ACT nasal spray Place 1 spray into both nostrils daily.  11/11/19   [provider]  lisinopril (ZESTRIL) 30 MG tablet Take 1 tablet (30 mg total) by mouth in the morning. Okay to restart around Monday, 05/14/2020 if blood pressure stable 05/14/20   Roxan Hockey, MD  Magnesium 100 MG TABS Take 50 mg by mouth at bedtime as needed (cramps).     [provider]  melatonin 5 MG TABS Take 2.5 mg by mouth at bedtime.     [provider]  metoprolol succinate (TOPROL-XL) 100 MG 24 hr tablet Take 100 mg by mouth in the morning. Take with or immediately following a meal.     [provider]  Multiple Vitamins-Minerals (CENTRUM SILVER ADULT 50+ PO) Take 1 tablet by mouth daily.    [provider]  ondansetron (ZOFRAN) 4 MG tablet Take 1 tablet (4 mg total) by mouth every 6 (six) hours as needed for nausea. 05/09/20   Roxan Hockey, MD  oxyCODONE (OXY IR/ROXICODONE) 5 MG immediate release tablet Take 1  tablet (5 mg total) by mouth every 6 (six) hours as needed for severe pain. For AFTER surgery only, start with 0.5 tablet, do not take and drive Patient not taking: Reported on 05/08/2020 04/20/20   Joylene John D, NP  pantoprazole (PROTONIX) 40 MG tablet Take 1 tablet (40 mg total) by mouth 2 (two) times daily before a meal. 05/09/20 05/09/21  Roxan Hockey, MD  senna (SENOKOT) 8.6 MG tablet Take 1 tablet by mouth in the morning.    [provider]  simvastatin (ZOCOR) 40 MG tablet Take 0.5 tablets (20 mg total) by mouth daily. Patient taking differently: Take 40 mg by mouth at bedtime.  12/19/13   Rhyne, Hulen Shouts, PA-C  sodium chloride (OCEAN) 0.65 % SOLN nasal spray Place 1 spray into both nostrils daily.    [provider]  vitamin C (ASCORBIC ACID) 500 MG tablet Take 500 mg by mouth daily.    [provider]    Allergies    Morphine and related  Review of Systems   Review of Systems  All other systems reviewed and are negative.   Physical Exam Updated Vital Signs BP (!) 152/65   Pulse 92   Temp (!) 97.5 F (36.4 C) (Oral)   Resp 19   Ht 1.473 m (4\' 10" )   Wt 72 kg   SpO2 100%   BMI 33.17 kg/m   Physical Exam Vitals and nursing note reviewed.  Constitutional:      General: She is not in acute distress.    Appearance: She is well-developed.  HENT:     Head: Normocephalic and atraumatic.     Mouth/Throat:     Pharynx: No oropharyngeal exudate.  Eyes:     General: No scleral icterus.       Right eye: No discharge.        Left eye: No discharge.     Pupils: Pupils are equal, round, and reactive to light.     Comments: Pale conj  Neck:     Thyroid: No thyromegaly.     Vascular: No JVD.  Cardiovascular:     Rate and Rhythm: Normal rate and regular rhythm.     Heart sounds: Normal heart sounds. No murmur heard.  No friction rub. No gallop.   Pulmonary:     Effort: Pulmonary effort is normal. No respiratory distress.     Breath  sounds: Normal breath sounds. No wheezing or rales.  Abdominal:     General: Bowel sounds are normal. There is no distension.     Palpations: Abdomen is soft. There is no mass.     Tenderness: There is no abdominal tenderness.     Comments: Abdominal bruising - fading - y ellow and purple, minimal ttp to the abd wall.  Musculoskeletal:        General: No tenderness. Normal range of motion.     Cervical back: Normal range of motion and neck supple.     Comments: No edema  Lymphadenopathy:     Cervical: No cervical adenopathy.  Skin:    General: Skin is warm and dry.     Findings: No erythema or rash.     Comments: Bruising as described  Neurological:     Mental Status: She is alert.     Coordination: Coordination normal.     Comments: Normal speech and gait  Psychiatric:        Behavior: Behavior normal.     ED Results / Procedures / Treatments   Labs (all labs ordered are listed, but only abnormal results are displayed) Labs Reviewed  CBC WITH DIFFERENTIAL/PLATELET - Abnormal; Notable for the following components:      Result Value   RBC 2.16 (*)    Hemoglobin 7.6 (*)    HCT 22.9 (*)    MCV 106.0 (*)    MCH 35.2 (*)    nRBC 0.3 (*)    Monocytes Absolute 1.1 (*)    Abs Immature Granulocytes 0.27 (*)    All  other components within normal limits  URINALYSIS, ROUTINE W REFLEX MICROSCOPIC - Abnormal; Notable for the following components:   Color, Urine STRAW (*)    Specific Gravity, Urine 1.001 (*)    All other components within normal limits  COMPREHENSIVE METABOLIC PANEL  TYPE AND SCREEN    EKG EKG Interpretation  Date/Time:  Saturday May 12 2020 10:51:35 EST Ventricular Rate:  68 PR Interval:    QRS Duration: 92 QT Interval:  390 QTC Calculation: 415 R Axis:   17 Text Interpretation: Sinus rhythm Left ventricular hypertrophy Borderline T abnormalities, inferior leads Since last tracing T wave abnormality improved. Confirmed by Noemi Chapel [phone]) on  05/12/2020 11:09:30 AM   Radiology No results found.  Procedures Procedures (including critical care time)  Medications Ordered in ED Medications - No data to display  ED Course  I have reviewed the triage vital signs and the nursing notes.  Pertinent labs & imaging results that were available during my care of the patient were reviewed by me and considered in my medical decision making (see chart for details).    MDM Rules/Calculators/A&P                          Check hemoglobin and urine -  Check BMP - pt agreeable Stable appearing with BP that is slightly elevated - she was recently taken off lisinopril when in hospital.  Still taking amlodipine and metoprolol.  Labs unremarkable, vital signs have improved, blood pressures in the 130s, hemoglobin is stable, metabolic panel unremarkable and urinalysis clean.  Patient updated, stable vital signs, stable for discharge, agreeable  Final Clinical Impression(s) / ED Diagnoses Final diagnoses:  Anemia, unspecified type    Rx / DC Orders ED Discharge Orders    None       Noemi Chapel, MD 05/12/20 1243

## 2020-05-12 NOTE — ED Notes (Signed)
Ambulated to BR with assistance.  Gait steady, tolerated well.  Denies dizziness but but has leg weakness.

## 2020-05-12 NOTE — ED Triage Notes (Signed)
Pt reports was had a recent GI bleed and was discharged from the hospital a few days ago.  Reports generalized weakness and feeling like she's going to pass out.  Denies vomiting or diarrhea.  Reports nausea.

## 2020-05-14 ENCOUNTER — Other Ambulatory Visit: Payer: Self-pay | Admitting: Oncology

## 2020-05-14 ENCOUNTER — Telehealth: Payer: Self-pay | Admitting: Oncology

## 2020-05-14 ENCOUNTER — Other Ambulatory Visit (INDEPENDENT_AMBULATORY_CARE_PROVIDER_SITE_OTHER): Payer: Self-pay | Admitting: *Deleted

## 2020-05-14 DIAGNOSIS — C561 Malignant neoplasm of right ovary: Secondary | ICD-10-CM

## 2020-05-14 NOTE — Telephone Encounter (Signed)
Called April Guerra and advised her of GYN Cancer Conference recommendations from this morning.  Discussed genetic counseling and she would like to proceed with having it scheduled at [facility].  Also discussed repeating her blood work this week to check her HGB level.  Appointment will be scheduled for 9:00 on 05/16/20.    She also said she is feeling better, still has bruising but not as sore.  She is urinating better and had a normal bowel movement yesterday and today.

## 2020-05-14 NOTE — Progress Notes (Signed)
Gynecologic Oncology Multi-Disciplinary Disposition Conference Note  Date of the Conference: 04/13/2020  Patient Name: April Guerra  Referring Provider: Dr. Jeffie Pollock Primary GYN Oncologist: Dr. 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   This Multidisciplinary conference took place involving physicians from Gynecologic Oncology, Medical Oncology, Radiation Oncology, Pathology, Radiology along with the Gynecologic Oncology Nurse Practitioner and RN.  Comprehensive assessment of the patient's malignancy, staging, need for surgery, chemotherapy, radiation therapy, and need for further testing were reviewed. Supportive measures, both inpatient and following discharge were also discussed. The recommended plan of care is documented. Greater than 35 minutes were spent correlating and coordinating this patient's care.

## 2020-05-14 NOTE — Addendum Note (Signed)
Addended by: Elmo Putt R on: 05/14/2020 11:29 AM   Modules accepted: Orders

## 2020-05-15 ENCOUNTER — Other Ambulatory Visit (HOSPITAL_COMMUNITY): Payer: Self-pay

## 2020-05-15 DIAGNOSIS — C561 Malignant neoplasm of right ovary: Secondary | ICD-10-CM

## 2020-05-16 ENCOUNTER — Other Ambulatory Visit (HOSPITAL_COMMUNITY): Payer: Self-pay | Admitting: *Deleted

## 2020-05-16 ENCOUNTER — Inpatient Hospital Stay (HOSPITAL_COMMUNITY): Payer: Medicare Other | Attending: Hematology

## 2020-05-16 ENCOUNTER — Other Ambulatory Visit: Payer: Self-pay

## 2020-05-16 DIAGNOSIS — C561 Malignant neoplasm of right ovary: Secondary | ICD-10-CM

## 2020-05-18 ENCOUNTER — Telehealth: Payer: Self-pay

## 2020-05-18 NOTE — Telephone Encounter (Signed)
TC from patient to report 2 incisions leaking.  Ms. Vandervoort stated left most outer incision had leakage of dark red blood approximately dime sized 2-3 days ago but appears "sealed" now w/ no further leakage.  Incision "2nd from left" had dark red blood approximately dime sized overnight.  Patient reports skin glue has fallen off, incision is clean, dry and only had drainage previously noted.  Patient denied any redness, not warm to touch, no pain or discomfort.  Patient reassured some leakage can be normal and given s/s to report.   Patient stated she feels better since ED visit on 05/12/2020 and is no longer having tarry stools or vomiting. Patient stated she stopped Lovenox after 4th injection and has NOT resumed her Eliquis. She does report having occasional "pulsing heartbeat in her head" and can feel her heart racing when at rest.  Patient instructed to call her atrial fibrillation clinic to let them know of her new diagnosis of ulcer, recent surgery and holding her Eliquis.  Patient stated she would call them today and give them an update of her symptoms and recent history.  Patient will repeat lab work on Monday, November 29th and come for  Post op appointment with Dr. Denman George on 05/24/2020.  Patient given after hours/weekend number to call with any questions or concerns.

## 2020-05-21 ENCOUNTER — Inpatient Hospital Stay (HOSPITAL_COMMUNITY): Payer: Medicare Other

## 2020-05-21 ENCOUNTER — Encounter (HOSPITAL_COMMUNITY): Payer: Self-pay | Admitting: Gastroenterology

## 2020-05-21 ENCOUNTER — Other Ambulatory Visit: Payer: Self-pay

## 2020-05-21 ENCOUNTER — Telehealth (HOSPITAL_COMMUNITY): Payer: Self-pay | Admitting: *Deleted

## 2020-05-21 DIAGNOSIS — C561 Malignant neoplasm of right ovary: Secondary | ICD-10-CM | POA: Diagnosis not present

## 2020-05-21 LAB — CBC WITH DIFFERENTIAL/PLATELET
Abs Immature Granulocytes: 0.05 10*3/uL (ref 0.00–0.07)
Basophils Absolute: 0 10*3/uL (ref 0.0–0.1)
Basophils Relative: 0 %
Eosinophils Absolute: 0.3 10*3/uL (ref 0.0–0.5)
Eosinophils Relative: 3 %
HCT: 30.1 % — ABNORMAL LOW (ref 36.0–46.0)
Hemoglobin: 9.9 g/dL — ABNORMAL LOW (ref 12.0–15.0)
Immature Granulocytes: 1 %
Lymphocytes Relative: 21 %
Lymphs Abs: 1.7 10*3/uL (ref 0.7–4.0)
MCH: 35.1 pg — ABNORMAL HIGH (ref 26.0–34.0)
MCHC: 32.9 g/dL (ref 30.0–36.0)
MCV: 106.7 fL — ABNORMAL HIGH (ref 80.0–100.0)
Monocytes Absolute: 1 10*3/uL (ref 0.1–1.0)
Monocytes Relative: 13 %
Neutro Abs: 4.9 10*3/uL (ref 1.7–7.7)
Neutrophils Relative %: 62 %
Platelets: 559 10*3/uL — ABNORMAL HIGH (ref 150–400)
RBC: 2.82 MIL/uL — ABNORMAL LOW (ref 3.87–5.11)
RDW: 15.5 % (ref 11.5–15.5)
WBC: 7.9 10*3/uL (ref 4.0–10.5)
nRBC: 0 % (ref 0.0–0.2)

## 2020-05-21 NOTE — Telephone Encounter (Signed)
Patient called stating she needs to know when to restart her Eliquis post GI bleed -- per Roderic Palau NP - this recommendation should come from GI doctor. Pt states she had her CBC rechecked today and awaiting callback for the results.

## 2020-05-22 DIAGNOSIS — I4891 Unspecified atrial fibrillation: Secondary | ICD-10-CM | POA: Diagnosis not present

## 2020-05-22 DIAGNOSIS — I10 Essential (primary) hypertension: Secondary | ICD-10-CM | POA: Diagnosis not present

## 2020-05-22 DIAGNOSIS — M1810 Unilateral primary osteoarthritis of first carpometacarpal joint, unspecified hand: Secondary | ICD-10-CM | POA: Diagnosis not present

## 2020-05-24 ENCOUNTER — Other Ambulatory Visit: Payer: Self-pay

## 2020-05-24 ENCOUNTER — Encounter: Payer: Self-pay | Admitting: Gynecologic Oncology

## 2020-05-24 ENCOUNTER — Encounter: Payer: Medicare Other | Admitting: Gynecologic Oncology

## 2020-05-24 ENCOUNTER — Inpatient Hospital Stay: Payer: Medicare Other | Attending: Gynecologic Oncology | Admitting: Gynecologic Oncology

## 2020-05-24 VITALS — BP 165/82 | HR 76 | Temp 97.9°F | Resp 18 | Ht <= 58 in | Wt 167.0 lb

## 2020-05-24 DIAGNOSIS — Z90710 Acquired absence of both cervix and uterus: Secondary | ICD-10-CM

## 2020-05-24 DIAGNOSIS — C561 Malignant neoplasm of right ovary: Secondary | ICD-10-CM | POA: Diagnosis not present

## 2020-05-24 DIAGNOSIS — Z90722 Acquired absence of ovaries, bilateral: Secondary | ICD-10-CM | POA: Diagnosis not present

## 2020-05-24 DIAGNOSIS — Z7189 Other specified counseling: Secondary | ICD-10-CM

## 2020-05-24 NOTE — Progress Notes (Signed)
Consult Note: Gyn-Onc  Consult was requested by Dr. Jeffie Pollock for the evaluation of April Guerra 74 y.o. female  CC:  Chief Complaint  Patient presents with  . Pelvic mass    Assessment/Plan:  [name]  is a 74 y.o.  year old with a history of stage II clear cell ovarian cancer s/p robotic assisted total hysterectomy, BSO, omentectomy, staging on 05/03/20.  Postop GI bleed - resolved with improving Hb. Encouraged patient to reach out to her primary care doctor, Dr. Nadara Mustard or Dr. Quintin Alto to discuss resuming Eliquis.  I discussed with April Guerra and her husband the diagnosis of stage II ovarian cancer.  I explained that it was stage II due to his dense adhesions and attachments to the surrounding pelvic sidewall and sigmoid mesentery.  I explained the need for adjuvant chemotherapy for an early stage grade 3 ovarian cancer and the associated improved survival and this is given.  I am recommending 6 cycles of carboplatin paclitaxel chemotherapy.  I am recommending consultation with genetics and testing for HRD and BRCA mutations.  If these are found she would be a candidate for PARP inhibitor maintenance therapy.  She is doing well postop and is cleared to commence chemotherapy at the discretion of medical oncology.  HPI: Ms April Guerra is a 74 year old P4 who was seen in consultation at the request of Dr Jeffie Pollock for evaluation of a complex ovarian mass and postmenopausal bleeding.  The patient reported approximately 31-monthhistory of progressive urinary frequency.  This was initially worked up by testing and treating empirically for urinary tract infections.  When that failed to resolve her symptoms she was referred to a urologist, Dr. WJeffie Pollock who performed a pelvic examination which palpated a mass in the pelvis.  Due to concern that her urinary frequency symptoms were secondary to extrinsic compression a CT scan of the abdomen and pelvis was ordered.  Of note her urinary analysis was fairly  unremarkable.  [facility] showed a 14.5 x 14 x 12 cm solid and cystic right ovarian mass worrisome for ovarian carcinoma.  There were no findings for intraperitoneal metastatic disease.  There is no lymphadenopathy present.  A transvaginal ultrasound on 04/12/2020 confirmed this finding of an ovarian mass.  The uterus itself measured 7.2 x 3.2 x 4.6 cm with a 6 mm endometrium.  The right ovary was not visualized.  The left ovary was not visualized.  There was a large complex cystic mass filling the pelvis measuring 14.5 x 10.9 x 13.4 cm.  The majority of this was a complicated cystic locule.  However inferiorly was complex solid and cystic intracystic mass identified estimated to be 5.7 x 6.5 x 5 cm with multiple septations.  There was blood flow within the mural nodule.  Interval Hx:  CA 125 tumor marker was elevated at 114 on 04/20/20.  On 05/03/2020 she underwent a robotic assisted total hysterectomy with bilateral salpingo-oophorectomy, omentectomy, radical tumor debulking, and omentectomy.  Intraoperative findings were significant for a grossly normal-appearing 7 cm uterus and grossly normal-appearing left tube and ovary.  The right ovary was replaced by 15 cm cystic and solid mass which was solid at its inferior aspect and densely adherent and infiltrating into the right uterosacral ligament and right parametrial tissues.  The rectum was adherent to the posterior uterus and cervix in the midline.  There was no gross upper abdominal disease.  There was trace ascites that was  amber in color.  There was unavoidable cyst ruptured during the procedure due to its infiltrative nature.  Diaphragms were smooth.  No gross abnormalities was seen in the intestines.  There was no gross residual tumor at the completion of the procedure representing a complete, R0 resection.  Final pathology revealed a clear cell carcinoma of the right ovary measuring 13.2 cm.   The carcinoma was confined to the ovary without involvement of the ovarian surface.  The left ovary tube and uterus were all benign.  The omentum was benign as were the peritoneal biopsies for staging.  Peritoneal washings were negative.  Due to the infiltrative nature of the tumor into the pelvic sidewall she was determined to have a stage II cancer, grade 3 clear cell.  Due to the locally infiltrative nature of the tumor and its high-grade she was recommended to have adjuvant chemotherapy for 6 cycles of carboplatin and paclitaxel in accordance with NCCN guidelines.  She was also recommended to have genetic testing for HRD and BRCA mutation.  Postoperatively she developed an upper GI bleed and was seen at [facility] where an endoscopy was performed which identified a gastric ulcer that was not bleeding at the time.  Her hemoglobin remained stable at 7.  She was discharged after 1 day.  Recheck of her hemoglobin 1 week subsequently was reassuringly elevated at 9.  She was taken off her Eliquis at the time of her GI bleed.  Of note she had been put on Eliquis in the past due to history of paroxysmal atrial fibrillation.  Current Meds:  Outpatient Encounter Medications as of 05/24/2020  Medication Sig  . acetaminophen (TYLENOL) 500 MG tablet Take 1,000 mg by mouth every 6 (six) hours as needed for moderate pain.   Marland Kitchen albuterol (VENTOLIN HFA) 108 (90 Base) MCG/ACT inhaler Inhale 2 puffs into the lungs every 4 (four) hours as needed for wheezing or shortness of breath.  Marland Kitchen amLODipine (NORVASC) 10 MG tablet Take 1 tablet (10 mg total) by mouth daily with lunch. For BP  . Calcium Carb-Cholecalciferol (CALCIUM 600/VITAMIN D PO) Take 1 tablet by mouth in the morning and at bedtime.  . cetirizine (ZYRTEC) 10 MG tablet Take 10 mg by mouth daily.  . Cholecalciferol (VITAMIN D) 125 MCG (5000 UT) CAPS Take 5,000 Units by mouth daily.  . Coenzyme Q10 (COQ10) 100 MG CAPS Take 100 mg by mouth at bedtime.   .  cyclobenzaprine (FLEXERIL) 10 MG tablet Take 5 mg by mouth daily as needed for muscle spasms.   Marland Kitchen diltiazem (CARDIZEM) 30 MG tablet Take 1 tablet every 4 hours AS NEEDED for AFIB heart rate >100 (Patient taking differently: Take 30 mg by mouth every 4 (four) hours as needed (AFIB heart rate > 100). )  . fluticasone (FLONASE) 50 MCG/ACT nasal spray Place 1 spray into both nostrils daily.   Marland Kitchen lisinopril (ZESTRIL) 30 MG tablet Take 1 tablet (30 mg total) by mouth in the morning. Okay to restart around Monday, 05/14/2020 if blood pressure stable  . Magnesium 100 MG TABS Take 50 mg by mouth at bedtime as needed (cramps).   . melatonin 5 MG TABS Take 2.5 mg by mouth at bedtime.   . metoprolol succinate (TOPROL-XL) 100 MG 24 hr tablet Take 100 mg by mouth in the morning. Take with or immediately following a meal.   . Multiple Vitamins-Minerals (CENTRUM SILVER ADULT 50+ PO) Take 1 tablet by mouth daily.  . ondansetron (ZOFRAN) 4 MG tablet Take  1 tablet (4 mg total) by mouth every 6 (six) hours as needed for nausea.  . pantoprazole (PROTONIX) 40 MG tablet Take 1 tablet (40 mg total) by mouth 2 (two) times daily before a meal.  . senna (SENOKOT) 8.6 MG tablet Take 1 tablet by mouth in the morning.  . simvastatin (ZOCOR) 40 MG tablet Take 0.5 tablets (20 mg total) by mouth daily. (Patient taking differently: Take 40 mg by mouth at bedtime. )  . sodium chloride (OCEAN) 0.65 % SOLN nasal spray Place 1 spray into both nostrils daily.  Marland Kitchen triamcinolone (KENALOG) 0.1 % SMARTSIG:1 Application Topical 2-3 Times Daily  . vitamin C (ASCORBIC ACID) 500 MG tablet Take 500 mg by mouth daily.  Marland Kitchen oxyCODONE (OXY IR/ROXICODONE) 5 MG immediate release tablet Take 1 tablet (5 mg total) by mouth every 6 (six) hours as needed for severe pain. For AFTER surgery only, start with 0.5 tablet, do not take and drive (Patient not taking: Reported on 05/08/2020)  . [DISCONTINUED] lisinopril (ZESTRIL) 20 MG tablet Take 20 mg by mouth daily.    No facility-administered encounter medications on file as of 05/24/2020.    Allergy:  Allergies  Allergen Reactions  . Morphine And Related Nausea And Vomiting    Social Hx:   Social History   Socioeconomic History  . Marital status: Married    Spouse name: Not on file  . Number of children: Not on file  . Years of education: Not on file  . Highest education level: Not on file  Occupational History  . Occupation: nurse  Tobacco Use  . Smoking status: Never Smoker  . Smokeless tobacco: Never Used  Vaping Use  . Vaping Use: Never used  Substance and Sexual Activity  . Alcohol use: No  . Drug use: No  . Sexual activity: Not Currently  Other Topics Concern  . Not on file  Social History Narrative  . Not on file   Social Determinants of Health   Financial Resource Strain:   . Difficulty of Paying Living Expenses: Not on file  Food Insecurity:   . Worried About Charity fundraiser in the Last Year: Not on file  . Ran Out of Food in the Last Year: Not on file  Transportation Needs:   . Lack of Transportation (Medical): Not on file  . Lack of Transportation (Non-Medical): Not on file  Physical Activity:   . Days of Exercise per Week: Not on file  . Minutes of Exercise per Session: Not on file  Stress:   . Feeling of Stress : Not on file  Social Connections:   . Frequency of Communication with Friends and Family: Not on file  . Frequency of Social Gatherings with Friends and Family: Not on file  . Attends Religious Services: Not on file  . Active Member of Clubs or Organizations: Not on file  . Attends Archivist Meetings: Not on file  . Marital Status: Not on file  Intimate Partner Violence:   . Fear of Current or Ex-Partner: Not on file  . Emotionally Abused: Not on file  . Physically Abused: Not on file  . Sexually Abused: Not on file    Past Surgical Hx:  Past Surgical History:  Procedure Laterality Date  . BIOPSY  05/08/2020   Procedure:  BIOPSY;  Surgeon: Harvel Quale, MD;  [facility]: [facility];  Service: Gastroenterology;;  . BREAST SURGERY    . CARDIAC CATHETERIZATION    . CHOLECYSTECTOMY    .  COLONOSCOPY N/A 08/14/2015   Procedure: COLONOSCOPY;  Surgeon: Aviva Signs, MD;  Location: [facility];  Service: Gastroenterology;  Laterality: N/A;  . ESOPHAGOGASTRODUODENOSCOPY (EGD) WITH PROPOFOL N/A 05/08/2020   Procedure: ESOPHAGOGASTRODUODENOSCOPY (EGD) WITH PROPOFOL;  Surgeon: Harvel Quale, MD;  Location: [facility];  Service: Gastroenterology;  Laterality: N/A;  . ROBOTIC ASSISTED TOTAL HYSTERECTOMY WITH BILATERAL SALPINGO OOPHERECTOMY N/A 05/03/2020   Procedure: XI ROBOTIC ASSISTED TOTAL HYSTERECTOMY WITH BILATERAL SALPINGO OOPHORECTOMY, OMENTECTOMY,RADICAL TUMOR DEBULKING, RIGID PROSTOSCOPY;  Surgeon: Everitt Amber, MD;  Location: [facility];  Service: Gynecology;  Laterality: N/A;  . THROMBECTOMY FEMORAL ARTERY Right 12/17/2013   Procedure: REPAIR OF RIGHT FEMORAL ARTERY PSEUDOANEURYSM;  Surgeon: Elam Dutch, MD;  Location: [place];  Service: Vascular;  Laterality: Right;    Past Medical Hx:  Past Medical History:  Diagnosis Date  . A-fib ([place])   . Anxiety   . Arthritis   . Asthma    hx of   . Cancer ([place])    ovarian  . Dyspnea    with exertion   . H/O seasonal allergies   . Heart murmur    as aa child   . Hypercholesteremia   . Hypertension     Past Gynecological History:  See HPI (SVD x 4) No LMP recorded. Patient has had a hysterectomy.  Family Hx:  Family History  Problem Relation Age of Onset  . Hypertension Mother   . Heart failure Mother   . Asthma Mother   . Ulcers Mother   . Benign prostatic hyperplasia Father   . Ulcers Father   . Esophageal varices Father   . Atrial fibrillation Sister   . Atrial fibrillation Brother   . Heart disease Brother   . Colon cancer Neg Hx     Review of Systems:  Constitutional  Feels well,   ENT Normal appearing ears and  nares bilaterally Skin/Breast  No rash, sores, jaundice, itching, dryness Cardiovascular  No chest pain, shortness of breath, or edema  Pulmonary  No cough or wheeze.  Gastro Intestinal  No nausea, vomitting, or diarrhoea. No bright red blood per rectum, no abdominal pain, change in bowel movement, or constipation.  Genito Urinary  No frequency, urgency, dysuria, see HPI Musculo Skeletal  No myalgia, arthralgia, joint swelling or pain  Neurologic  No weakness, numbness, change in gait,  Psychology  No depression, anxiety, insomnia.   Vitals:  Blood pressure (!) 165/82, pulse 76, temperature 97.9 F (36.6 C), temperature source Tympanic, resp. rate 18, height 4' 10"  (1.473 m), weight 167 lb (75.8 kg), SpO2 100 %.  Physical Exam: WD in NAD Neck  Supple NROM, without any enlargements.  Lymph Node Survey No cervical supraclavicular or inguinal adenopathy Cardiovascular  Pulse normal rate, regularity and rhythm. S1 and S2 normal.  Lungs  Clear to auscultation bilateraly, without wheezes/crackles/rhonchi. Good air movement.  Skin  No rash/lesions/breakdown  Psychiatry  Alert and oriented to person, place, and time  Abdomen  Normoactive bowel sounds, abdomen soft, non-tender and obese without evidence of hernia. Well healed incisions with echymoses in skin and hematomas under incisions. Back No CVA tenderness Genito Urinary  Vaginal cuff healing normally, no lesions, no gapping, no blood.  Rectal  deferred Extremities  No bilateral cyanosis, clubbing or edema.   30 minutes of direct face to face counseling time was spent with the patient. This included discussion about prognosis, therapy recommendations and postoperative side effects and are beyond the scope of routine postoperative care.  Thereasa Solo, MD  05/24/2020, 5:01 PM

## 2020-05-24 NOTE — Patient Instructions (Signed)
Dr Denman George is recommending that you reach out to see Dr Nadara Mustard or Dr Quintin Alto to discuss restarting your Eliquis.   Dr Denman George is recommending that you receive 6 doses of chemotherapy with carboplatin and paclitaxel. Dr Delton Coombes is the medical oncologist at [facility] who will administer this.   Dr Denman George will see you for follow-up in the office at regular intervals following completion of chemotherapy.

## 2020-05-28 ENCOUNTER — Telehealth (INDEPENDENT_AMBULATORY_CARE_PROVIDER_SITE_OTHER): Payer: Self-pay | Admitting: *Deleted

## 2020-05-28 DIAGNOSIS — D500 Iron deficiency anemia secondary to blood loss (chronic): Secondary | ICD-10-CM | POA: Diagnosis not present

## 2020-05-28 DIAGNOSIS — K922 Gastrointestinal hemorrhage, unspecified: Secondary | ICD-10-CM | POA: Diagnosis not present

## 2020-05-28 DIAGNOSIS — I4891 Unspecified atrial fibrillation: Secondary | ICD-10-CM | POA: Diagnosis not present

## 2020-05-28 NOTE — Telephone Encounter (Signed)
Protonix should not cause constipation, but if she is having these problems, she can take Miralax daily  Maylon Peppers, MD Gastroenterology and Hepatology [facility]

## 2020-05-28 NOTE — Assessment & Plan Note (Signed)
Initial presentation: Pelvic mass (UTI symptoms) CT abdomen and pelvis performed at [facility] on 04/11/2020 showed a 14.5 x 14 x 12 cm solid and cystic right ovarian mass worrisome for ovarian carcinoma.  There were no findings for intraperitoneal metastatic disease. CA 125 tumor marker was elevated at 114 on 04/20/20.  Stage II clear cell ovarian cancer s/p robotic assisted total hysterectomy, BSO, omentectomy, staging on 05/03/20.  05/03/2020 she underwent a robotic assisted total hysterectomy with bilateral salpingo-oophorectomy, omentectomy, radical tumor debulking, and omentectomy  clear cell carcinoma of the right ovary measuring 13.2 cm.  The carcinoma was confined to the ovary without involvement of the ovarian surface.  The left ovary tube and uterus were all benign.  The omentum was benign as were the peritoneal biopsies for staging.  Peritoneal washings were negative.  Due to the infiltrative nature of the tumor into the pelvic sidewall she was determined to have a stage II cancer  Upper GI Bleed: Post op: Taken off Eliquis. Early stage grade 3 Recommendation: Adjuvant chemo with 6 cycles of Taxol and carboplatin

## 2020-05-28 NOTE — Telephone Encounter (Signed)
pt of [facility] has ulcer prescribed protonix wants to know if this can constipate you, having real problem with that, can she take miralax - please call [phone]

## 2020-05-28 NOTE — Telephone Encounter (Signed)
Patient was called and a voice message was left on her voice mail with Dr.Castaneda's recommendation.

## 2020-05-29 ENCOUNTER — Telehealth (INDEPENDENT_AMBULATORY_CARE_PROVIDER_SITE_OTHER): Payer: Self-pay | Admitting: Gastroenterology

## 2020-05-29 ENCOUNTER — Encounter (HOSPITAL_COMMUNITY): Payer: Self-pay | Admitting: Hematology and Oncology

## 2020-05-29 ENCOUNTER — Encounter (HOSPITAL_COMMUNITY): Payer: Self-pay

## 2020-05-29 ENCOUNTER — Inpatient Hospital Stay (HOSPITAL_COMMUNITY): Payer: Medicare Other | Attending: Hematology | Admitting: Hematology and Oncology

## 2020-05-29 ENCOUNTER — Other Ambulatory Visit (HOSPITAL_COMMUNITY): Payer: Self-pay

## 2020-05-29 ENCOUNTER — Other Ambulatory Visit: Payer: Self-pay

## 2020-05-29 ENCOUNTER — Inpatient Hospital Stay (HOSPITAL_COMMUNITY): Payer: Medicare Other

## 2020-05-29 VITALS — BP 164/89 | HR 69 | Temp 96.8°F | Resp 18 | Ht <= 58 in | Wt 163.8 lb

## 2020-05-29 DIAGNOSIS — R971 Elevated cancer antigen 125 [CA 125]: Secondary | ICD-10-CM | POA: Diagnosis not present

## 2020-05-29 DIAGNOSIS — Z23 Encounter for immunization: Secondary | ICD-10-CM | POA: Insufficient documentation

## 2020-05-29 DIAGNOSIS — Z90722 Acquired absence of ovaries, bilateral: Secondary | ICD-10-CM | POA: Diagnosis not present

## 2020-05-29 DIAGNOSIS — Z90710 Acquired absence of both cervix and uterus: Secondary | ICD-10-CM | POA: Insufficient documentation

## 2020-05-29 DIAGNOSIS — C561 Malignant neoplasm of right ovary: Secondary | ICD-10-CM | POA: Diagnosis not present

## 2020-05-29 LAB — COMPREHENSIVE METABOLIC PANEL
ALT: 20 U/L (ref 0–44)
AST: 22 U/L (ref 15–41)
Albumin: 4.4 g/dL (ref 3.5–5.0)
Alkaline Phosphatase: 50 U/L (ref 38–126)
Anion gap: 10 (ref 5–15)
BUN: 14 mg/dL (ref 8–23)
CO2: 23 mmol/L (ref 22–32)
Calcium: 9.3 mg/dL (ref 8.9–10.3)
Chloride: 99 mmol/L (ref 98–111)
Creatinine, Ser: 0.52 mg/dL (ref 0.44–1.00)
GFR, Estimated: >60 mL/min (ref >60)
Glucose, Bld: 93 mg/dL (ref 70–99)
Potassium: 4.2 mmol/L (ref 3.5–5.1)
Sodium: 132 mmol/L — ABNORMAL LOW (ref 135–145)
Total Bilirubin: 0.5 mg/dL (ref 0.3–1.2)
Total Protein: 7.4 g/dL (ref 6.5–8.1)

## 2020-05-29 LAB — CBC WITH DIFFERENTIAL/PLATELET
Abs Immature Granulocytes: 0.02 10*3/uL (ref 0.00–0.07)
Basophils Absolute: 0 10*3/uL (ref 0.0–0.1)
Basophils Relative: 1 %
Eosinophils Absolute: 0.2 10*3/uL (ref 0.0–0.5)
Eosinophils Relative: 4 %
HCT: 31.7 % — ABNORMAL LOW (ref 36.0–46.0)
Hemoglobin: 10.1 g/dL — ABNORMAL LOW (ref 12.0–15.0)
Immature Granulocytes: 0 %
Lymphocytes Relative: 25 %
Lymphs Abs: 1.6 10*3/uL (ref 0.7–4.0)
MCH: 33.6 pg (ref 26.0–34.0)
MCHC: 31.9 g/dL (ref 30.0–36.0)
MCV: 105.3 fL — ABNORMAL HIGH (ref 80.0–100.0)
Monocytes Absolute: 0.7 10*3/uL (ref 0.1–1.0)
Monocytes Relative: 12 %
Neutro Abs: 3.8 10*3/uL (ref 1.7–7.7)
Neutrophils Relative %: 58 %
Platelets: 449 10*3/uL — ABNORMAL HIGH (ref 150–400)
RBC: 3.01 MIL/uL — ABNORMAL LOW (ref 3.87–5.11)
RDW: 14.3 % (ref 11.5–15.5)
WBC: 6.4 10*3/uL (ref 4.0–10.5)
nRBC: 0 % (ref 0.0–0.2)

## 2020-05-29 LAB — IRON AND TIBC
Iron: 74 ug/dL (ref 28–170)
Saturation Ratios: 17 % (ref 10.4–31.8)
TIBC: 444 ug/dL (ref 250–450)
UIBC: 370 ug/dL

## 2020-05-29 LAB — FERRITIN: Ferritin: 40 ng/mL (ref 11–307)

## 2020-05-29 MED ORDER — DEXAMETHASONE 4 MG PO TABS: 4.0000 mg | ORAL_TABLET | Freq: Every day | ORAL | 0 refills | Status: DC

## 2020-05-29 MED ORDER — LIDOCAINE-PRILOCAINE 2.5-2.5 % EX CREA: TOPICAL_CREAM | CUTANEOUS | 3 refills | Status: DC

## 2020-05-29 MED ORDER — PROCHLORPERAZINE MALEATE 10 MG PO TABS: 10.0000 mg | ORAL_TABLET | Freq: Four times a day (QID) | ORAL | 1 refills | Status: DC | PRN

## 2020-05-29 MED ORDER — INFLUENZA VAC A&B SA ADJ QUAD 0.5 ML IM PRSY
0.5000 mL | PREFILLED_SYRINGE | Freq: Once | INTRAMUSCULAR | Status: AC
  Administered 2020-05-29: 0.5 mL via INTRAMUSCULAR
  Filled 2020-05-29: qty 0.5

## 2020-05-29 MED ORDER — ONDANSETRON HCL 8 MG PO TABS: 8.0000 mg | ORAL_TABLET | Freq: Two times a day (BID) | ORAL | 1 refills | Status: DC | PRN

## 2020-05-29 NOTE — Progress Notes (Signed)
I met with patient today during initial visit with Dr. Lindi Adie. I introduced myself and explained my role in the patient's care. Written information provided regarding port-a-cath as well as recommended chemotherapy regimen as discussed by Dr. Lindi Adie. I provided my contact information and encouraged the patient to call with questions.

## 2020-05-29 NOTE — Telephone Encounter (Signed)
Patient called office stated she had a procedure done in mid November - was taken off of elequist - wants to know if she can start back taking it again - please advise - ph# [phone]

## 2020-05-29 NOTE — Patient Instructions (Signed)
[place] at [facility] Discharge Instructions  You were seen and examined today by Dr. Lindi Adie. Dr. Lindi Adie discussed your past medical history, family history of ovarian cancer and the events that led to you being here today. You have been diagnosed with Stage II Ovarian Cancer. Much like Dr. Denman George, Dr. Lindi Adie has recommended chemotherapy due to the size of your tumor. This will prevent the cancer from returning.  Dr. Lindi Adie has recommended a combination of two medications known as Taxol and Carboplatin. This is given in the clinic once every 3 weeks for 6 cycles. Common side effects of treatment includes hair loss, nausea (you will get anti nausea medications in the clinic and a prescription to take as needed at home), decreased blood counts (you are given a booster injection here at the clinic during each treatment cycle - usually two days after treatment), fatigue, taste changes (increase water intake, increase proteins and decrease carbohydrates), and peripheral neuropathy (this can be a reversible side effect - please tell us if you experience that). There is a risk of allergic reaction related to the initial infusion but we will administer premedications to prevent that. The booster injection (Neulasta or similar, this will depend on your insurance approval) can cause some bone discomfort, this is typically relieved by tylenol and claritin.  The safest way to administer chemotherapy is through a Port-A-Cath. Once we know when the port will be placed, we will arrange for you to start chemotherapy.   Thank you for choosing [place] at [facility] to provide your oncology and hematology care.  To afford each patient quality time with our provider, please arrive at least 15 minutes before your scheduled appointment time.   If you have a lab appointment with the [facility] please come in thru the Main Entrance and check in at the main information  desk.  You need to re-schedule your appointment should you arrive 10 or more minutes late.  We strive to give you quality time with our providers, and arriving late affects you and other patients whose appointments are after yours.  Also, if you no show three or more times for appointments you may be dismissed from the clinic at the providers discretion.     Again, thank you for choosing [facility].  Our hope is that these requests will decrease the amount of time that you wait before being seen by our physicians.       _____________________________________________________________  Should you have questions after your visit to [facility], please contact our office at [phone] and follow the prompts.  Our office hours are 8:00 a.m. and 4:30 p.m. Monday - Friday.  Please note that voicemails left after 4:00 p.m. may not be returned until the following business day.  We are closed weekends and major holidays.  You do have access to a nurse 24-7, just call the main number to the clinic [phone] and do not press any options, hold on the line and a nurse will answer the phone.    For prescription refill requests, have your pharmacy contact our office and allow 72 hours.    Due to Covid, you will need to wear a mask upon entering the hospital. If you do not have a mask, a mask will be given to you at the Main Entrance upon arrival. For doctor visits, patients may have 1 support person age 74 or older with them. For treatment visits, patients  can not have anyone with them due to social distancing guidelines and our immunocompromised population.

## 2020-05-29 NOTE — Progress Notes (Signed)
Patient tolerated flu vaccine with no complaints voiced.  Site clean and dry with no bruising or swelling noted at site.  Band aid applied.  VSS with discharge and left ambulatory with no s/s of distress noted. 

## 2020-05-29 NOTE — Progress Notes (Signed)
START ON PATHWAY REGIMEN - Ovarian     A cycle is every 21 days:     Paclitaxel      Carboplatin   **Always confirm dose/schedule in your pharmacy ordering system**  Patient Characteristics: Postoperative without Neoadjuvant Therapy (Pathologic Staging), Newly Diagnosed, Adjuvant Therapy, Any Stage I, Grade 3 BRCA Mutation Status: Absent Therapeutic Status: Postoperative without Neoadjuvant Therapy (Pathologic Staging) AJCC 8 Stage Grouping: Unknown AJCC M Category: cM0 AJCC T Category: pT1a AJCC N Category: pNX Tumor Grade: 3 Intent of Therapy: Curative Intent, Discussed with Patient

## 2020-05-29 NOTE — Progress Notes (Signed)
[facility] NOTE  Patient Care Team: Rory Percy, MD as PCP - General (Family Medicine)  CHIEF COMPLAINTS/PURPOSE OF CONSULTATION:  Newly diagnosed Ovarian cancer  HISTORY OF PRESENTING ILLNESS:  April Guerra 74 y.o. female is here because of recent diagnosis of right sided ovarian cancer.  She originally presented with symptoms of urinary frequency and UTI symptoms and extensive work-up led to identification of a large ovarian cyst.  The elevation of CA-125 raises a concern for malignancy.  She underwent a robotic total hysterectomy with bilateral salpingo-oophorectomy omentectomy and tumor debulking surgery on 05/03/2020.  She was found to have a stage II ovarian cancer.  She was referred to [place] for discussion regarding adjuvant chemotherapy.  She has healed and recovered very well from surgery.  Postoperatively she had a GI bleed and hence Eliquis was discontinued.  She wants to know if she can resume her Eliquis.  She was started on Eliquis for atrial fibrillation.  She is currently in normal sinus rhythm.  She was previously cardioverted.  I reviewed her records extensively and collaborated the history with the patient.  SUMMARY OF ONCOLOGIC HISTORY: Oncology History  Right ovarian epithelial cancer ([place])  05/03/2020 Initial Diagnosis   Right ovarian epithelial cancer ([place])   05/03/2020 Cancer Staging   Staging form: Ovary, Fallopian Tube, and Primary Peritoneal Carcinoma, AJCC 8th Edition - Clinical stage from 05/03/2020: FIGO Stage II, calculated as Stage Unknown (cT2b, cNX, cM0) - Signed by Everitt Amber, MD on 05/24/2020      MEDICAL HISTORY:  Past Medical History:  Diagnosis Date  . A-fib ([place])   . Anxiety   . Arthritis   . Asthma    hx of   . Cancer ([place])    ovarian  . Dyspnea    with exertion   . H/O seasonal allergies   . Heart murmur    as aa child   . Hypercholesteremia   . Hypertension   . Spinal stenosis     SURGICAL HISTORY: Past  Surgical History:  Procedure Laterality Date  . ABDOMINAL HYSTERECTOMY    . BIOPSY  05/08/2020   Procedure: BIOPSY;  Surgeon: Harvel Quale, MD;  [facility];  Service: Gastroenterology;;  . BREAST SURGERY    . CARDIAC CATHETERIZATION    . CHOLECYSTECTOMY    . COLONOSCOPY N/A 08/14/2015   Procedure: COLONOSCOPY;  Surgeon: Aviva Signs, MD;  Location: [facility];  Service: Gastroenterology;  Laterality: N/A;  . ESOPHAGOGASTRODUODENOSCOPY (EGD) WITH PROPOFOL N/A 05/08/2020   Procedure: ESOPHAGOGASTRODUODENOSCOPY (EGD) WITH PROPOFOL;  Surgeon: Harvel Quale, MD;  Location: [facility];  Service: Gastroenterology;  Laterality: N/A;  . ROBOTIC ASSISTED TOTAL HYSTERECTOMY WITH BILATERAL SALPINGO OOPHERECTOMY N/A 05/03/2020   Procedure: XI ROBOTIC ASSISTED TOTAL HYSTERECTOMY WITH BILATERAL SALPINGO OOPHORECTOMY, OMENTECTOMY,RADICAL TUMOR DEBULKING, RIGID PROSTOSCOPY;  Surgeon: Everitt Amber, MD;  Location: [facility];  Service: Gynecology;  Laterality: N/A;  . THROMBECTOMY FEMORAL ARTERY Right 12/17/2013   Procedure: REPAIR OF RIGHT FEMORAL ARTERY PSEUDOANEURYSM;  Surgeon: Elam Dutch, MD;  Location: [facility];  Service: Vascular;  Laterality: Right;    SOCIAL HISTORY: Social History   Socioeconomic History  . Marital status: Married    Spouse name: Not on file  . Number of children: 4  . Years of education: Not on file  . Highest education level: Not on file  Occupational History  . Occupation: retired Marine scientist  Tobacco Use  . Smoking status: Never Smoker  . Smokeless tobacco:  Never Used  Vaping Use  . Vaping Use: Never used  Substance and Sexual Activity  . Alcohol use: No  . Drug use: No  . Sexual activity: Not Currently  Other Topics Concern  . Not on file  Social History Narrative  . Not on file   Social Determinants of Health   Financial Resource Strain: Low Risk   . Difficulty of Paying Living Expenses: Not hard at all  Food Insecurity: No  Food Insecurity  . Worried About Charity fundraiser in the Last Year: Never true  . Ran Out of Food in the Last Year: Never true  Transportation Needs: No Transportation Needs  . Lack of Transportation (Medical): No  . Lack of Transportation (Non-Medical): No  Physical Activity: Insufficiently Active  . Days of Exercise per Week: 7 days  . Minutes of Exercise per Session: 10 min  Stress: No Stress Concern Present  . Feeling of Stress : Not at all  Social Connections: Moderately Integrated  . Frequency of Communication with Friends and Family: More than three times a week  . Frequency of Social Gatherings with Friends and Family: More than three times a week  . Attends Religious Services: More than 4 times per year  . Active Member of Clubs or Organizations: No  . Attends Archivist Meetings: Never  . Marital Status: Married  Human resources officer Violence: Not At Risk  . Fear of Current or Ex-Partner: No  . Emotionally Abused: No  . Physically Abused: No  . Sexually Abused: No    FAMILY HISTORY: Family History  Problem Relation Age of Onset  . Hypertension Mother   . Heart failure Mother   . Asthma Mother   . Ulcers Mother   . Benign prostatic hyperplasia Father   . Ulcers Father   . Esophageal varices Father   . Atrial fibrillation Sister   . Atrial fibrillation Brother   . Heart disease Brother   . Colon cancer Neg Hx     ALLERGIES:  is allergic to morphine and related.  MEDICATIONS:  Current Outpatient Medications  Medication Sig Dispense Refill  . acetaminophen (TYLENOL) 500 MG tablet Take 1,000 mg by mouth every 6 (six) hours as needed for moderate pain.     Marland Kitchen albuterol (VENTOLIN HFA) 108 (90 Base) MCG/ACT inhaler Inhale 2 puffs into the lungs every 4 (four) hours as needed for wheezing or shortness of breath. 18 g 2  . amLODipine (NORVASC) 10 MG tablet Take 1 tablet (10 mg total) by mouth daily with lunch. For BP 30 tablet 3  . Calcium Carb-Cholecalciferol  (CALCIUM 600/VITAMIN D PO) Take 1 tablet by mouth in the morning and at bedtime.    . cetirizine (ZYRTEC) 10 MG tablet Take 10 mg by mouth daily.    . Cholecalciferol (VITAMIN D) 125 MCG (5000 UT) CAPS Take 5,000 Units by mouth daily.    . Coenzyme Q10 (COQ10) 100 MG CAPS Take 100 mg by mouth at bedtime.     . cyclobenzaprine (FLEXERIL) 10 MG tablet Take 5 mg by mouth daily as needed for muscle spasms.     Marland Kitchen diltiazem (CARDIZEM) 30 MG tablet Take 1 tablet every 4 hours AS NEEDED for AFIB heart rate >100 (Patient taking differently: Take 30 mg by mouth every 4 (four) hours as needed (AFIB heart rate > 100). ) 30 tablet 3  . fluticasone (FLONASE) 50 MCG/ACT nasal spray Place 1 spray into both nostrils daily.     Marland Kitchen lisinopril (  ZESTRIL) 30 MG tablet Take 1 tablet (30 mg total) by mouth in the morning. Okay to restart around Monday, 05/14/2020 if blood pressure stable 30 tablet 3  . Magnesium 100 MG TABS Take 50 mg by mouth at bedtime as needed (cramps).     . melatonin 5 MG TABS Take 2.5 mg by mouth at bedtime.     . metoprolol succinate (TOPROL-XL) 100 MG 24 hr tablet Take 100 mg by mouth in the morning. Take with or immediately following a meal.     . Multiple Vitamins-Minerals (CENTRUM SILVER ADULT 50+ PO) Take 1 tablet by mouth daily.    . ondansetron (ZOFRAN) 4 MG tablet Take 1 tablet (4 mg total) by mouth every 6 (six) hours as needed for nausea. 20 tablet 0  . pantoprazole (PROTONIX) 40 MG tablet Take 1 tablet (40 mg total) by mouth 2 (two) times daily before a meal. 60 tablet 1  . senna (SENOKOT) 8.6 MG tablet Take 1 tablet by mouth in the morning.    . simvastatin (ZOCOR) 40 MG tablet Take 0.5 tablets (20 mg total) by mouth daily. (Patient taking differently: Take 40 mg by mouth at bedtime. ) 30 tablet 2  . sodium chloride (OCEAN) 0.65 % SOLN nasal spray Place 1 spray into both nostrils daily.    Marland Kitchen triamcinolone (KENALOG) 0.1 % SMARTSIG:1 Application Topical 2-3 Times Daily    . vitamin C  (ASCORBIC ACID) 500 MG tablet Take 500 mg by mouth daily.     No current facility-administered medications for this visit.    REVIEW OF SYSTEMS:   Constitutional: Denies fevers, chills or abnormal night sweats Eyes: Denies blurriness of vision, double vision or watery eyes Ears, nose, mouth, throat, and face: Denies mucositis or sore throat Respiratory: Denies cough, dyspnea or wheezes Cardiovascular: Denies palpitation, chest discomfort or lower extremity swelling Gastrointestinal:  Denies nausea, heartburn or change in bowel habits Skin: Denies abnormal skin rashes Lymphatics: Denies new lymphadenopathy or easy bruising Neurological:Denies numbness, tingling or new weaknesses Behavioral/Psych: Mood is stable, no new changes  Breast:  Denies any palpable lumps or discharge All other systems were reviewed with the patient and are negative.  PHYSICAL EXAMINATION: ECOG PERFORMANCE STATUS: 1 - Symptomatic but completely ambulatory  Vitals:   05/29/20 0947 05/29/20 1001  BP: (!) 172/79 (!) 164/89  Pulse: 69   Resp: 18   Temp: (!) 96.8 F (36 C)   SpO2: 98%    Filed Weights   05/29/20 0947  Weight: 163 lb 12.8 oz (74.3 kg)    GENERAL:alert, no distress and comfortable SKIN: skin color, texture, turgor are normal, no rashes or significant lesions EYES: normal, conjunctiva are pink and non-injected, sclera clear OROPHARYNX:no exudate, no erythema and lips, buccal mucosa, and tongue normal  NECK: supple, thyroid normal size, non-tender, without nodularity LYMPH:  no palpable lymphadenopathy in the cervical, axillary or inguinal LUNGS: clear to auscultation and percussion with normal breathing effort HEART: regular rate & rhythm and no murmurs and no lower extremity edema ABDOMEN:abdomen soft, non-tender and normal bowel sounds, laparoscopic scars are well-healed.  There is some bruising but it appears to be improving significantly. Musculoskeletal:no cyanosis of digits and no  clubbing  PSYCH: alert & oriented x 3 with fluent speech NEURO: no focal motor/sensory deficits    LABORATORY DATA:  I have reviewed the data as listed Lab Results  Component Value Date   WBC 7.9 05/21/2020   HGB 9.9 (L) 05/21/2020   HCT 30.1 (L) 05/21/2020  MCV 106.7 (H) 05/21/2020   PLT 559 (H) 05/21/2020   Lab Results  Component Value Date   NA 136 05/12/2020   K 4.1 05/12/2020   CL 103 05/12/2020   CO2 26 05/12/2020    RADIOGRAPHIC STUDIES: I have personally reviewed the radiological reports and agreed with the findings in the report.  ASSESSMENT AND PLAN:  Right ovarian epithelial cancer [facility]) Initial presentation: Pelvic mass (UTI symptoms) CT abdomen and pelvis performed at [facility] urology on 04/11/2020 showed a 14.5 x 14 x 12 cm solid and cystic right ovarian mass worrisome for ovarian carcinoma.  There were no findings for intraperitoneal metastatic disease. CA 125 tumor marker was elevated at 114 on 04/20/20.  Clinical stage: Stage II clear cell ovarian cancer s/p robotic assisted total hysterectomy, BSO, omentectomy, staging on 05/03/20. Pathological stage: T1 a NX grade 3: Stage Ia  05/03/2020 she underwent a robotic assisted total hysterectomy with bilateral salpingo-oophorectomy, omentectomy, radical tumor debulking, and omentectomy Pathology review: Clear cell carcinoma of the right ovary measuring 13.2 cm.  The carcinoma was confined to the ovary without involvement of the ovarian surface.  The left ovary tube and uterus were all benign.  The omentum was benign as were the peritoneal biopsies for staging.  Peritoneal washings were negative.  Due to the infiltrative nature of the tumor into the pelvic sidewall she was determined to have a stage II cancer  Upper GI Bleed: Post op: Taken off Eliquis.  We will check a CBC and iron studies today.  If necessary she can get IV iron.   Recommendation: Adjuvant chemo with 6 cycles of Taxol and carboplatin  Chemo  counseling: I discussed risks and benefits of systemic chemotherapy with Taxol and carboplatin.  We discussed the risk of neuropathy, allergic reactions to Taxol.  We also discussed the decrease in blood counts and the requirement for a booster shot to prevent any risk of infection.  We discussed the risk of nausea and vomiting as well as hair loss and fatigue as result of chemotherapy.  Rarely long-term bone marrow damage has also been documented. 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 Return to clinic in a couple of weeks to start chemo after port has been placed.  All questions were answered. The patient knows to call the clinic with any problems, questions or concerns.    Harriette Ohara, MD 05/29/20

## 2020-05-29 NOTE — Telephone Encounter (Signed)
I called the patient to discuss her anticoagulation but she did not answer when I called her to the phone on file.  I left a detailed message explained to her that she had an ulcer that bled in the past and that led to a drop in her hemoglobin.  Her hemoglobin has improved while off the Eliquis.  There is always a risk of rebleeding if the anticoagulation is restarted, which she will need to discuss thoroughly with her cardiologist discussed the benefits and contraindications of it.  We will repeat an endoscopy in 2 months Darius Bump please check if this is scheduled) to assess ulcer healing.  Patient was advised to call back if she has more questions about it.  Maylon Peppers, MD Gastroenterology and Hepatology [facility]

## 2020-05-30 NOTE — Telephone Encounter (Signed)
April Guerra is stating that she is getting her port Monday and next Wednesday she starts Chemo, she is asking if she should do 2 or 3 months follow up, please advise?

## 2020-05-30 NOTE — Telephone Encounter (Signed)
It should be 3 months after her last endoscopy, but it is not strict, between 2-4 months should be OK.  Thanks

## 2020-05-31 ENCOUNTER — Encounter (INDEPENDENT_AMBULATORY_CARE_PROVIDER_SITE_OTHER): Payer: Self-pay

## 2020-05-31 ENCOUNTER — Other Ambulatory Visit (INDEPENDENT_AMBULATORY_CARE_PROVIDER_SITE_OTHER): Payer: Self-pay

## 2020-05-31 DIAGNOSIS — K922 Gastrointestinal hemorrhage, unspecified: Secondary | ICD-10-CM

## 2020-05-31 LAB — FOLATE: Folate: 87.6 ng/mL (ref >5.9)

## 2020-05-31 NOTE — Telephone Encounter (Signed)
April Guerra is scheduled for 08/07/20 and she is aware

## 2020-05-31 NOTE — Telephone Encounter (Signed)
Thanks

## 2020-06-01 ENCOUNTER — Other Ambulatory Visit: Payer: Self-pay | Admitting: Student

## 2020-06-04 ENCOUNTER — Other Ambulatory Visit: Payer: Self-pay

## 2020-06-04 ENCOUNTER — Ambulatory Visit (HOSPITAL_COMMUNITY)
Admission: RE | Admit: 2020-06-04 | Discharge: 2020-06-04 | Disposition: A | Discharge status: Home / Self Care | Payer: Medicare Other | Source: Ambulatory Visit | Attending: Hematology and Oncology | Admitting: Hematology and Oncology

## 2020-06-04 DIAGNOSIS — C561 Malignant neoplasm of right ovary: Secondary | ICD-10-CM | POA: Diagnosis not present

## 2020-06-04 DIAGNOSIS — Z79899 Other long term (current) drug therapy: Secondary | ICD-10-CM | POA: Diagnosis not present

## 2020-06-04 DIAGNOSIS — Z452 Encounter for adjustment and management of vascular access device: Secondary | ICD-10-CM | POA: Diagnosis not present

## 2020-06-04 HISTORY — PX: IR IMAGING GUIDED PORT INSERTION: IMG5740

## 2020-06-04 LAB — CBC
HCT: 36.2 % (ref 36.0–46.0)
Hemoglobin: 11.4 g/dL — ABNORMAL LOW (ref 12.0–15.0)
MCH: 33 pg (ref 26.0–34.0)
MCHC: 31.5 g/dL (ref 30.0–36.0)
MCV: 104.9 fL — ABNORMAL HIGH (ref 80.0–100.0)
Platelets: 345 10*3/uL (ref 150–400)
RBC: 3.45 MIL/uL — ABNORMAL LOW (ref 3.87–5.11)
RDW: 13.7 % (ref 11.5–15.5)
WBC: 6 10*3/uL (ref 4.0–10.5)
nRBC: 0 % (ref 0.0–0.2)

## 2020-06-04 LAB — PROTIME-INR
INR: 0.9 (ref 0.8–1.2)
Prothrombin Time: 12.2 seconds (ref 11.4–15.2)

## 2020-06-04 LAB — APTT: aPTT: 30 seconds (ref 24–36)

## 2020-06-04 MED ORDER — CEFAZOLIN SODIUM-DEXTROSE 2-4 GM/100ML-% IV SOLN
INTRAVENOUS | Status: AC
  Filled 2020-06-04: qty 100

## 2020-06-04 MED ORDER — MIDAZOLAM HCL 2 MG/2ML IJ SOLN
INTRAMUSCULAR | Status: AC
  Filled 2020-06-04: qty 2

## 2020-06-04 MED ORDER — HEPARIN SOD (PORK) LOCK FLUSH 100 UNIT/ML IV SOLN
INTRAVENOUS | Status: AC
  Administered 2020-06-04: 12:00:00 500 [IU]
  Filled 2020-06-04: qty 5

## 2020-06-04 MED ORDER — MIDAZOLAM HCL 2 MG/2ML IJ SOLN
INTRAMUSCULAR | Status: AC | PRN
  Administered 2020-06-04 (×2): 1 mg via INTRAVENOUS

## 2020-06-04 MED ORDER — FENTANYL CITRATE (PF) 100 MCG/2ML IJ SOLN
INTRAMUSCULAR | Status: AC | PRN
  Administered 2020-06-04: 50 ug via INTRAVENOUS
  Administered 2020-06-04: 25 ug via INTRAVENOUS

## 2020-06-04 MED ORDER — FENTANYL CITRATE (PF) 100 MCG/2ML IJ SOLN
INTRAMUSCULAR | Status: AC
  Filled 2020-06-04: qty 2

## 2020-06-04 MED ORDER — SODIUM CHLORIDE 0.9 % IV SOLN: INTRAVENOUS | Status: DC

## 2020-06-04 MED ORDER — CEFAZOLIN SODIUM-DEXTROSE 2-4 GM/100ML-% IV SOLN
2.0000 g | INTRAVENOUS | Status: AC
  Administered 2020-06-04: 12:00:00 2 g via INTRAVENOUS

## 2020-06-04 MED ORDER — LIDOCAINE-EPINEPHRINE 1 %-1:100000 IJ SOLN
INTRAMUSCULAR | Status: AC
  Filled 2020-06-04: qty 1

## 2020-06-04 MED ORDER — LIDOCAINE-EPINEPHRINE 1 %-1:100000 IJ SOLN
INTRAMUSCULAR | Status: AC | PRN
  Administered 2020-06-04: 10 mL

## 2020-06-04 NOTE — H&P (Signed)
Chief Complaint: Patient was seen in consultation today for [facility] a cath placement at the request of [place]  Referring Physician(s): Nicholas Lose  Supervising Physician: Corrie Mckusick  Patient Status: [facility] - Out-pt  History of Present Illness: April Guerra is a 74 y.o. female   Newly diagnosed Ovarian cancer Follows with Dr Lindi Adie  Plan for chemo to start Wed Dec 15  Scheduled today for [facility] placement  Past Medical History:  Diagnosis Date  . A-fib ([place])   . Anxiety   . Arthritis   . Asthma    hx of   . Cancer ([place])    ovarian  . Dyspnea    with exertion   . H/O seasonal allergies   . Heart murmur    as aa child   . Hypercholesteremia   . Hypertension   . Spinal stenosis     Past Surgical History:  Procedure Laterality Date  . ABDOMINAL HYSTERECTOMY    . BIOPSY  05/08/2020   Procedure: BIOPSY;  Surgeon: Harvel Quale, MD;  Location: [facility];  Service: Gastroenterology;;  . BREAST SURGERY    . CARDIAC CATHETERIZATION    . CHOLECYSTECTOMY    . COLONOSCOPY N/A 08/14/2015   Procedure: COLONOSCOPY;  Surgeon: Aviva Signs, MD;  Location: [facility];  Service: Gastroenterology;  Laterality: N/A;  . ESOPHAGOGASTRODUODENOSCOPY (EGD) WITH PROPOFOL N/A 05/08/2020   Procedure: ESOPHAGOGASTRODUODENOSCOPY (EGD) WITH PROPOFOL;  Surgeon: Harvel Quale, MD;  Location: [facility];  Service: Gastroenterology;  Laterality: N/A;  . ROBOTIC ASSISTED TOTAL HYSTERECTOMY WITH BILATERAL SALPINGO OOPHERECTOMY N/A 05/03/2020   Procedure: XI ROBOTIC ASSISTED TOTAL HYSTERECTOMY WITH BILATERAL SALPINGO OOPHORECTOMY, OMENTECTOMY,RADICAL TUMOR DEBULKING, RIGID PROSTOSCOPY;  Surgeon: Everitt Amber, MD;  Location: [facility];  Service: Gynecology;  Laterality: N/A;  . THROMBECTOMY FEMORAL ARTERY Right 12/17/2013   Procedure: REPAIR OF RIGHT FEMORAL ARTERY PSEUDOANEURYSM;  Surgeon: Elam Dutch, MD;  Location: [place];  Service: Vascular;  Laterality: Right;     Allergies: Morphine and related and Zofran [ondansetron]  Medications: Prior to Admission medications   Medication Sig Start Date End Date Taking? Authorizing Provider  acetaminophen (TYLENOL) 500 MG tablet Take 500-1,000 mg by mouth every 6 (six) hours as needed for moderate pain.   Yes [provider]  albuterol (VENTOLIN HFA) 108 (90 Base) MCG/ACT inhaler Inhale 2 puffs into the lungs every 4 (four) hours as needed for wheezing or shortness of breath. 05/09/20  Yes Emokpae, Courage, MD  amLODipine (NORVASC) 10 MG tablet Take 1 tablet (10 mg total) by mouth daily with lunch. For BP 05/09/20  Yes Emokpae, Courage, MD  Calcium Carb-Cholecalciferol (CALCIUM 600/VITAMIN D PO) Take 1 tablet by mouth in the morning and at bedtime.   Yes [provider]  cetirizine (ZYRTEC) 10 MG tablet Take 10 mg by mouth daily.   Yes [provider]  Cholecalciferol (VITAMIN D) 125 MCG (5000 UT) CAPS Take 5,000 Units by mouth daily.   Yes [provider]  Coenzyme Q10 (COQ10) 100 MG CAPS Take 100 mg by mouth at bedtime.    Yes [provider]  cyclobenzaprine (FLEXERIL) 10 MG tablet Take 5 mg by mouth See admin instructions. Take 5 mg at night, may take a second 5 mg dose during the day as needed for muscle spasms 04/22/19  Yes [provider]  diltiazem (CARDIZEM) 30 MG tablet Take 1 tablet every 4 hours AS NEEDED for AFIB heart rate >100 Patient taking differently: Take 30 mg by mouth  every 4 (four) hours as needed (AFIB heart rate > 100). 05/04/18  Yes Sherran Needs, NP  fluticasone (FLONASE) 50 MCG/ACT nasal spray Place 1 spray into both nostrils daily.  11/11/19  Yes [provider]  lidocaine-prilocaine (EMLA) cream Apply to affected area once Patient taking differently: Apply 1 application topically daily as needed (port access). 05/29/20  Yes Nicholas Lose, MD  lisinopril (ZESTRIL) 30 MG tablet Take 1 tablet (30 mg total) by mouth in the  morning. Okay to restart around Monday, 05/14/2020 if blood pressure stable 05/14/20  Yes Emokpae, Courage, MD  Magnesium 100 MG TABS Take 50 mg by mouth at bedtime.   Yes [provider]  melatonin 5 MG TABS Take 2.5 mg by mouth at bedtime.    Yes [provider]  metoprolol succinate (TOPROL-XL) 100 MG 24 hr tablet Take 100 mg by mouth in the morning. Take with or immediately following a meal.   Yes [provider]  Multiple Vitamins-Minerals (CENTRUM SILVER ADULT 50+ PO) Take 1 tablet by mouth daily.   Yes [provider]  Omega-3 Fatty Acids (FISH OIL) 1000 MG CAPS Take 1,000 mg by mouth daily.   Yes [provider]  ondansetron (ZOFRAN) 4 MG tablet Take 1 tablet (4 mg total) by mouth every 6 (six) hours as needed for nausea. Patient taking differently: Take 2 mg by mouth every 6 (six) hours as needed for nausea. 05/09/20  Yes Emokpae, Courage, MD  pantoprazole (PROTONIX) 40 MG tablet Take 1 tablet (40 mg total) by mouth 2 (two) times daily before a meal. 05/09/20 05/09/21 Yes Emokpae, Courage, MD  polyethylene glycol powder (GLYCOLAX/MIRALAX) 17 GM/SCOOP powder Take 17 g by mouth daily with lunch.   Yes [provider]  senna (SENOKOT) 8.6 MG tablet Take 1 tablet by mouth 2 (two) times daily.   Yes [provider]  simvastatin (ZOCOR) 40 MG tablet Take 0.5 tablets (20 mg total) by mouth daily. Patient taking differently: Take 40 mg by mouth at bedtime. 12/19/13  Yes Rhyne, Samantha J, PA-C  sodium chloride (OCEAN) 0.65 % SOLN nasal spray Place 1 spray into both nostrils See admin instructions. Use 1 spray in each nostril in the morning may use a second dose at night as needed for congestion   Yes [provider]  tetrahydrozoline-zinc (VISINE-AC) 0.05-0.25 % ophthalmic solution Place 1 drop into both eyes daily.   Yes [provider]  triamcinolone (KENALOG) 0.1 % Apply 1 application topically daily as needed  (irritation). 05/22/20  Yes [provider]  vitamin C (ASCORBIC ACID) 500 MG tablet Take 500 mg by mouth 2 (two) times daily.   Yes [provider]  Vitamins A & D (VITAMIN A & D) ointment Apply 1 application topically as needed (irritation).   Yes [provider]  dexamethasone (DECADRON) 4 MG tablet Take 1 tablet (4 mg total) by mouth daily. Start the day after carboplatin chemotherapy for 3 days. 05/29/20   Nicholas Lose, MD  ondansetron (ZOFRAN) 8 MG tablet Take 1 tablet (8 mg total) by mouth 2 (two) times daily as needed for refractory nausea / vomiting. Start on day 3 after carboplatin chemo. 05/29/20   Nicholas Lose, MD  prochlorperazine (COMPAZINE) 10 MG tablet Take 1 tablet (10 mg total) by mouth every 6 (six) hours as needed (Nausea or vomiting). 05/29/20   Nicholas Lose, MD     Family History  Problem Relation Age of Onset  . Hypertension Mother   .  Heart failure Mother   . Asthma Mother   . Ulcers Mother   . Benign prostatic hyperplasia Father   . Ulcers Father   . Esophageal varices Father   . Atrial fibrillation Sister   . Atrial fibrillation Brother   . Heart disease Brother   . Colon cancer Neg Hx     Social History   Socioeconomic History  . Marital status: Married    Spouse name: Not on file  . Number of children: 4  . Years of education: Not on file  . Highest education level: Not on file  Occupational History  . Occupation: retired Marine scientist  Tobacco Use  . Smoking status: Never Smoker  . Smokeless tobacco: Never Used  Vaping Use  . Vaping Use: Never used  Substance and Sexual Activity  . Alcohol use: No  . Drug use: No  . Sexual activity: Not Currently  Other Topics Concern  . Not on file  Social History Narrative  . Not on file   Social Determinants of Health   Financial Resource Strain: Low Risk   . Difficulty of Paying Living Expenses: Not hard at all  Food Insecurity: No Food Insecurity  . Worried About Sales executive in the Last Year: Never true  . Ran Out of Food in the Last Year: Never true  Transportation Needs: No Transportation Needs  . Lack of Transportation (Medical): No  . Lack of Transportation (Non-Medical): No  Physical Activity: Insufficiently Active  . Days of Exercise per Week: 7 days  . Minutes of Exercise per Session: 10 min  Stress: No Stress Concern Present  . Feeling of Stress : Not at all  Social Connections: Moderately Integrated  . Frequency of Communication with Friends and Family: More than three times a week  . Frequency of Social Gatherings with Friends and Family: More than three times a week  . Attends Religious Services: More than 4 times per year  . Active Member of Clubs or Organizations: No  . Attends Archivist Meetings: Never  . Marital Status: Married    Review of Systems: A 12 point ROS discussed and pertinent positives are indicated in the HPI above.  All other systems are negative.  Review of Systems  Constitutional: Negative for activity change, fatigue and fever.  Respiratory: Negative for cough and shortness of breath.   Cardiovascular: Negative for chest pain.  Gastrointestinal: Negative for abdominal pain, nausea and vomiting.  Musculoskeletal: Negative for back pain.  Neurological: Negative for weakness.  Psychiatric/Behavioral: Negative for behavioral problems and confusion.    Vital Signs: BP (!) 146/51   Pulse 62   Temp 98.1 F (36.7 C) (Oral)   Resp 14   Ht 4\' 10"  (1.473 m)   Wt 160 lb (72.6 kg)   SpO2 99%   BMI 33.44 kg/m   Physical Exam Vitals reviewed.  HENT:     Mouth/Throat:     Mouth: Mucous membranes are moist.  Cardiovascular:     Rate and Rhythm: Normal rate and regular rhythm.     Heart sounds: Normal heart sounds.  Pulmonary:     Effort: Pulmonary effort is normal.     Breath sounds: Normal breath sounds.  Abdominal:     Palpations: Abdomen is soft.     Tenderness: There is no abdominal tenderness.   Musculoskeletal:        General: Normal range of motion.  Skin:    General: Skin is warm.  Neurological:  Mental Status: She is alert and oriented to person, place, and time.  Psychiatric:        Behavior: Behavior normal.     Imaging: No results found.  Labs:  CBC: Recent Labs    05/09/20 1349 05/12/20 1148 05/21/20 0934 05/29/20 1117  WBC 12.3* 9.4 7.9 6.4  HGB 7.7* 7.6* 9.9* 10.1*  HCT 23.7* 22.9* 30.1* 31.7*  PLT 313 367 559* 449*    COAGS: Recent Labs    05/08/20 0747  INR 1.1    BMP: Recent Labs    05/08/20 0747 05/09/20 0626 05/12/20 1148 05/29/20 1117  NA 131* 136 136 132*  K 4.4 3.7 4.1 4.2  CL 100 104 103 99  CO2 23 25 26 23   GLUCOSE 108* 93 97 93  BUN 38* 14 8 14   CALCIUM 8.9 8.3* 9.2 9.3  CREATININE 0.52 0.53 0.51 0.52  GFRNONAA >60 >60 >60 >60    LIVER FUNCTION TESTS: Recent Labs    04/25/20 0909 05/08/20 0747 05/12/20 1148 05/29/20 1117  BILITOT 0.6 0.8 0.6 0.5  AST 24 62* 29 22  ALT 21 62* 37 20  ALKPHOS 45 46 44 50  PROT 7.7 6.8 6.5 7.4  ALBUMIN 4.7 3.8 3.8 4.4    TUMOR MARKERS: No results for input(s): AFPTM, CEA, CA199, CHROMGRNA in the last 8760 hours.  Assessment and Plan:  New Dx Ovarian Cancer PAC placement scheduled in IR today To begin chemo Wed Dec 15 Risks and benefits of image guided port-a-catheter placement was discussed with the patient including, but not limited to bleeding, infection, pneumothorax, or fibrin sheath development and need for additional procedures.  All of the patient's questions were answered, patient is agreeable to proceed. Consent signed and in chart.    Thank you for this interesting consult.  I greatly enjoyed meeting GABRYELLE WHITMOYER and look forward to participating in their care.  A copy of this report was sent to the requesting provider on this date.  Electronically Signed: Lavonia Drafts, PA-C 06/04/2020, 9:14 AM   I spent a total of  30 Minutes   in face to face in  clinical consultation, greater than 50% of which was counseling/coordinating care for [facility] a cath placement

## 2020-06-04 NOTE — Discharge Instructions (Signed)
Implanted Port Insertion, Care After This sheet gives you information about how to care for yourself after your procedure. Your health care provider may also give you more specific instructions. If you have problems or questions, contact your health care provider. What can I expect after the procedure? After the procedure, it is common to have:  Discomfort at the port insertion site.  Bruising on the skin over the port. This should improve over 3-4 days. Follow these instructions at home: [facility] care  After your port is placed, you will get a manufacturer's information card. The card has information about your port. Keep this card with you at all times.  Take care of the port as told by your health care provider. Ask your health care provider if you or a family member can get training for taking care of the port at home. A home health care nurse may also take care of the port.  Make sure to remember what type of port you have. Incision care      Follow instructions from your health care provider about how to take care of your port insertion site. Make sure you: ? Wash your hands with soap and water before and after you change your bandage (dressing). If soap and water are not available, use hand sanitizer. ? Remove your dressing as told by your health care provider. In 24 Hrs. ? Leave stitches (sutures), skin glue, or adhesive strips in place. These skin closures may need to stay in place for 2 weeks or longer. If adhesive strip edges start to loosen and curl up, you may trim the loose edges. Do not remove adhesive strips completely unless your health care provider tells you to do that.  Check your port insertion site every day for signs of infection. Check for: ? Redness, swelling, or pain. ? Fluid or blood. ? Warmth. ? Pus or a bad smell. Activity  Return to your normal activities as told by your health care provider. Ask your health care provider what activities are safe for  you.  Do not lift anything that is heavier than 10 lb (4.5 kg), or the limit that you are told, until your health care provider says that it is safe. General instructions  Take over-the-counter and prescription medicines only as told by your health care provider.  Do not take baths, swim, or use a hot tub until your health care provider approves. Ask your health care provider if you may take showers. You may only be allowed to take sponge baths.  Do not drive for 24 hours if you were given a sedative during your procedure.  Wear a medical alert bracelet in case of an emergency. This will tell any health care providers that you have a port.  Keep all follow-up visits as told by your health care provider. This is important. Contact a health care provider if:  You cannot flush your port with saline as directed, or you cannot draw blood from the port.  You have a fever or chills.  You have redness, swelling, or pain around your port insertion site.  You have fluid or blood coming from your port insertion site.  Your port insertion site feels warm to the touch.  You have pus or a bad smell coming from the port insertion site. Get help right away if:  You have chest pain or shortness of breath.  You have bleeding from your port that you cannot control. Summary  Take care of the port as told  by your health care provider. Keep the manufacturer's information card with you at all times.  Change your dressing as told by your health care provider.  Contact a health care provider if you have a fever or chills or if you have redness, swelling, or pain around your port insertion site.  Keep all follow-up visits as told by your health care provider. This information is not intended to replace advice given to you by your health care provider. Make sure you discuss any questions you have with your health care provider. Document Revised: 01/05/2018 Document Reviewed: 01/05/2018 Elsevier  Patient Education  [place].

## 2020-06-04 NOTE — Patient Instructions (Addendum)
[facility] Chemotherapy Teaching   You are diagnosed with Stage II ovarian cancer.  You will be treated in the clinic every 3 weeks with a combination of chemotherapy drugs.  Those drugs are paclitaxel (Taxol) and carboplatin.  The intent of treatment is cure.  You will see the doctor regularly throughout treatment.  We will obtain blood work from you prior to every treatment and monitor your results to make sure it is safe to give your treatment. The doctor monitors your response to treatment by the way you are feeling, your blood work, and by obtaining scans periodically.  There will be wait times while you are here for treatment.  It will take about 30 minutes to 1 hour for your lab work to result.  Then there will be wait times while pharmacy mixes your medications.    Medications you will receive in the clinic prior to your chemotherapy medications:  Aloxi:  ALOXI is used in adults to help prevent the nausea and vomiting that happens with certain chemotherapy drugs.  Aloxi is a long acting medication, and will remain in your system about 2 days.   Emend:  This is an anti-nausea medication that is used with Aloxi to help prevent nausea and vomiting caused by chemotherapy.  Dexamethasone:  This is a steroid given prior to chemotherapy to help prevent allergic reactions; it may also help prevent and control nausea and diarrhea.   Pepcid:  This medication is a histamine blocker that helps prevent and allergic reaction to your chemotherapy.   Benadryl:  This is a histamine blocker (different from the Pepcid) that helps prevent allergic/infusion reactions to your chemotherapy. This medication may cause dizziness/drowsiness.    Paclitaxel (Taxol)  About This Drug  Paclitaxel is a drug used to treat cancer. It is given in the vein (IV).  This will take 3 hours to infuse.  This first infusion will take longer because it is increased slowly to monitor for reactions.  The nurse will  be in the room with you for the first 15 minutes of the first infusion.  Possible Side Effects  . Hair loss. Hair loss is often temporary, although with certain medicine, hair loss can sometimes be permanent. Hair loss may happen suddenly or gradually. If you lose hair, you may lose it from your head, face, armpits, pubic area, chest, and/or legs. You may also notice your hair getting thin.  . Swelling of your legs, ankles and/or feet (edema)  . Flushing  . Nausea and throwing up (vomiting)  . Loose bowel movements (diarrhea)  . Bone marrow depression. This is a decrease in the number of white blood cells, red blood cells, and platelets. This may raise your risk of infection, make you tired and weak (fatigue), and raise your risk of bleeding.  . Effects on the nerves are called peripheral neuropathy. You may feel numbness, tingling, or pain in your hands and feet. It may be hard for you to button your clothes, open jars, or walk as usual. The effect on the nerves may get worse with more doses of the drug. These effects get better in some people after the drug is stopped but it does not get better in all people.  . Changes in your liver function  . Bone, joint and muscle pain  . Abnormal EKG  . Allergic reaction: Allergic reactions, including anaphylaxis are rare but may happen in some patients. Signs of allergic reaction to this drug may be swelling of the  face, feeling like your tongue or throat are swelling, trouble breathing, rash, itching, fever, chills, feeling dizzy, and/or feeling that your heart is beating in a fast or not normal way. If this happens, do not take another dose of this drug. You should get urgent medical treatment.  . Infection  . Changes in your kidney function.  Note: Each of the side effects above was reported in 20% or greater of patients treated with paclitaxel. Not all possible side effects are included above.   Warnings and Precautions  . Severe  allergic reactions  . Severe bone marrow depression   Treating Side Effects  . To help with hair loss, wash with a mild shampoo and avoid washing your hair every day.  . Avoid rubbing your scalp, instead, pat your hair or scalp dry  . Avoid coloring your hair  . Limit your use of hair spray, electric curlers, blow dryers, and curling irons.  . If you are interested in getting a wig, talk to your nurse. You can also call the [employer] [facility] at [phone] to find out information about the "Look Good, Feel Better" program close to where you live. It is a free program where women getting chemotherapy can learn about wigs, turbans and scarves as well as makeup techniques and skin and nail care.  . Ask your doctor or nurse about medicines that are available to help stop or lessen diarrhea and/or nausea.  . To help with nausea and vomiting, eat small, frequent meals instead of three large meals a day. Choose foods and drinks that are at room temperature. Ask your nurse or doctor about other helpful tips and medicine that is available to help or stop lessen these symptoms.  . If you get diarrhea, eat low-fiber foods that are high in protein and calories and avoid foods that can irritate your digestive tracts or lead to cramping. Ask your nurse or doctor about medicine that can lessen or stop your diarrhea.  . Mouth care is very important. Your mouth care should consist of routine, gentle cleaning of your teeth or dentures and rinsing your mouth with a mixture of 1/2 teaspoon of salt in 8 ounces of water or  teaspoon of baking soda in 8 ounces of water. This should be done at least after each meal and at bedtime.  . If you have mouth sores, avoid mouthwash that has alcohol. Also avoid alcohol and smoking because they can bother your mouth and throat.  . Drink plenty of fluids (a minimum of eight glasses per day is recommended).  . Take your temperature as your doctor or nurse tells  you, and whenever you feel like you may have a fever.  . Talk to your doctor or nurse about precautions you can take to avoid infections and bleeding.  . Be careful when cooking, walking, and handling sharp objects and hot liquids.  Food and Drug Interactions  . There are no known interactions of paclitaxel with food.  . This drug may interact with other medicines. Tell your doctor and pharmacist about all the medicines and dietary supplements (vitamins, minerals, herbs and others) that you are taking at this time.  . The safety and use of dietary supplements and alternative diets are often not known. Using these might affect your cancer or interfere with your treatment. Until more is known, you should not use dietary supplements or alternative diets without your cancer doctor's help.  When to Call the Doctor  Call your doctor or nurse  if you have any of the following symptoms and/or any new or unusual symptoms:  . Fever of 100.4 F (38 C) or above  . Chills  . Redness, pain, warmth, or swelling at the IV site during the infusion  . Signs of allergic reaction: swelling of the face, feeling like your tongue or throat are swelling, trouble breathing, rash, itching, fever, chills, feeling dizzy, and/or feeling that your heart is beating in a fast or not normal way  . Feeling that your heart is beating in a fast or not normal way (palpitations)  . Weight gain of 5 pounds in one week (fluid retention)  . Decreased urine or very dark urine  . Signs of liver problems: dark urine, pale bowel movements, bad stomach pain, feeling very tired and weak, unusual  itching, or yellowing of the eyes or skin  . Heavy menstrual period that lasts longer than normal  . Easy bruising or bleeding  . Nausea that stops you from eating or drinking, and/or that is not relieved by prescribed medicines.  . Loose bowel movements (diarrhea) more than 4 times a day or diarrhea with weakness or  lightheadedness  . Pain in your mouth or throat that makes it hard to eat or drink  . Lasting loss of appetite or rapid weight loss of five pounds in a week  . Signs of peripheral neuropathy: numbness, tingling, or decreased feeling in fingers or toes; trouble walking or changes in the way you walk; or feeling clumsy when buttoning clothes, opening jars, or other routine activities  . Joint and muscle pain that is not relieved by prescribed medicines  . Extreme fatigue that interferes with normal activities  . While you are getting this drug, please tell your nurse right away if you have any pain, redness, or swelling at the site of the IV infusion.  . If you think you are pregnant.  Reproduction Warnings  . Pregnancy warning: This drug may have harmful effects on the unborn child, it is recommended that effective methods of birth control should be used during your cancer treatment. Let your doctor know right away if you think you may be pregnant.  . Breast feeding warning: Women should not breast feed during treatment because this drug could enter the breastmilk and cause harm to a breast feeding baby.   Carboplatin (Paraplatin, CBDCA)  About This Drug  Carboplatin is used to treat cancer. It is given in the vein through your port a cath.  It will take 30 minutes to infuse. You will receive this medication every 3 weeks.   Possible Side Effects  . Bone marrow suppression. This is a decrease in the number of white blood cells, red blood cells, and platelets. This may raise your risk of infection, make you tired and weak (fatigue), and raise your risk of bleeding.  . Nausea and vomiting (throwing up)  . Weakness  . Changes in your liver function  . Changes in your kidney function  . Electrolyte changes  . Pain  Note: Each of the side effects above was reported in 20% or greater of patients treated with carboplatin. Not all possible side effects are included  above.   Warnings and Precautions  . Severe bone marrow suppression  . Allergic reactions, including anaphylaxis are rare but may happen in some patients. Signs of allergic reaction to this drug may be swelling of the face, feeling like your tongue or throat are swelling, trouble breathing, rash, itching, fever, chills, feeling dizzy,  and/or feeling that your heart is beating in a fast or not normal way. If this happens, do not take another dose of this drug. You should get urgent medical treatment.  . Severe nausea and vomiting  . Effects on the nerves are called peripheral neuropathy. This risk is increased if you are over the age of 34 or if you have received other medicine with risk of peripheral neuropathy. You may feel numbness, tingling, or pain in your hands and feet. It may be hard for you to button your clothes, open jars, or walk as usual. The effect on the nerves may get worse with more doses of the drug. These effects get better in some people after the drug is stopped but it does not get better in all people.  Marland Kitchen Blurred vision, loss of vision or other changes in eyesight  . Decreased hearing  . Skin and tissue irritation including redness, pain, warmth, or swelling at the IV site if the drug leaks out of the vein and into nearby tissue.  . Severe changes in your kidney function, which can cause kidney failure  . Severe changes in your liver function, which can cause liver failure  Note: Some of the side effects above are very rare. If you have concerns and/or questions, please discuss them with your medical team.  Important Information  . This drug may be present in the saliva, tears, sweat, urine, stool, vomit, semen, and vaginal secretions. Talk to your doctor and/or your nurse about the necessary precautions to take during this time.  Treating Side Effects  . Manage tiredness by pacing your activities for the day.  . Be sure to include periods of rest between  energy-draining activities.  . To decrease the risk of infection, wash your hands regularly.  . Avoid close contact with people who have a cold, the flu, or other infections.  . Take your temperature as your doctor or nurse tells you, and whenever you feel like you may have a fever.  . To help decrease the risk of bleeding, use a soft toothbrush. Check with your nurse before using dental floss.  . Be very careful when using knives or tools.  . Use an electric shaver instead of a razor.  . Drink plenty of fluids (a minimum of eight glasses per day is recommended).  . If you throw up or have loose bowel movements, you should drink more fluids so that you do not become dehydrated (lack of water in the body from losing too much fluid).  . To help with nausea and vomiting, eat small, frequent meals instead of three large meals a day.  Choose foods and drinks that are at room temperature. Ask your nurse or doctor about other helpful tips and medicine that is available to help stop or lessen these symptoms.  . If you have numbness and tingling in your hands and feet, be careful when cooking, walking, and handling sharp objects and hot liquids.  Marland Kitchen Keeping your pain under control is important to your well-being. Please tell your doctor or nurse if you are experiencing pain.  Food and Drug Interactions  . There are no known interactions of carboplatin with food.  . This drug may interact with other medicines. Tell your doctor and pharmacist about all the prescription and over-the-counter medicines and dietary supplements (vitamins, minerals, herbs and others) that you are taking at this time. Also, check with your doctor or pharmacist before starting any new prescription or over-the-counter medicines, or  dietary supplements to make sure that there are no interactions.  When to Call the Doctor  Call your doctor or nurse if you have any of these symptoms and/or any new or unusual symptoms:  .  Fever of 100.4 F (38 C) or higher  . Chills  . Tiredness that interferes with your daily activities  . Feeling dizzy or lightheaded  . Easy bleeding or bruising  . Nausea that stops you from eating or drinking and/or is not relieved by prescribed medicines  . Throwing up/vomiting  . Blurred vision or other changes in eyesight  . Decrease in hearing or ringing in the ear  . Signs of allergic reaction: swelling of the face, feeling like your tongue or throat are swelling, trouble breathing, rash, itching, fever, chills, feeling dizzy, and/or feeling that your heart is beating in a fast or not normal way. If this happens, call 911 for emergency care.  . While you are getting this drug, please tell your nurse right away if you have any pain, redness, or swelling at the site of the IV infusion  . Signs of possible liver problems: dark urine, pale bowel movements, bad stomach pain, feeling very tired and weak, unusual itching, or yellowing of the eyes or skin  . Decreased urine, or very dark urine  . Numbness, tingling, or pain in your hands and feet  . Pain that does not go away or is not relieved by prescribed medicine  . If you think you may be pregnant  Reproduction Warnings  . Pregnancy warning: This drug may have harmful effects on the unborn baby. Women of child bearing potential should use effective methods of birth control during your cancer treatment. Let your doctor know right away if you think you may be pregnant.  . Breastfeeding warning: It is not known if this drug passes into breast milk. For this reason, women should not breastfeed during treatment because this drug could enter the breast milk and cause harm to a breastfeeding baby.  . Fertility warning: Human fertility studies have not been done with this drug. Talk with your doctor or nurse if you plan to have children. Ask for information on sperm or egg banking.  SELF CARE ACTIVITIES WHILE RECEIVING  CHEMOTHERAPY:  Hydration Increase your fluid intake 48 hours prior to treatment and drink at least 8 to 12 cups (64 ounces) of water/decaffeinated beverages per day after treatment. You can still have your cup of coffee or soda but these beverages do not count as part of your 8 to 12 cups that you need to drink daily. No alcohol intake.  Medications Continue taking your normal prescription medication as prescribed.  If you start any new herbal or new supplements please let us know first to make sure it is safe.  Mouth Care Have teeth cleaned professionally before starting treatment. Keep dentures and partial plates clean. Use soft toothbrush and do not use mouthwashes that contain alcohol. Biotene is a good mouthwash that is available at most pharmacies or may be ordered by calling [phone]. Use warm salt water gargles (1 teaspoon salt per 1 quart warm water) before and after meals and at bedtime. If you need dental work, please let the doctor know before you go for your appointment so that we can coordinate the best possible time for you in regards to your chemo regimen. You need to also let your dentist know that you are actively taking chemo. We may need to do labs prior to your  dental appointment.  Skin Care Always use sunscreen that has not expired and with SPF (Sun Protection Factor) of 50 or higher. Wear hats to protect your head from the sun. Remember to use sunscreen on your hands, ears, face, & feet.  Use good moisturizing lotions such as udder cream, eucerin, or even Vaseline. Some chemotherapies can cause dry skin, color changes in your skin and nails.    . Avoid long, hot showers or baths. . Use gentle, fragrance-free soaps and laundry detergent. . Use moisturizers, preferably creams or ointments rather than lotions because the thicker consistency is better at preventing skin dehydration. Apply the cream or ointment within 15 minutes of showering. Reapply moisturizer at night, and  moisturize your hands every time after you wash them.  Hair Loss (if your doctor says your hair will fall out)  . If your doctor says that your hair is likely to fall out, decide before you begin chemo whether you want to wear a wig. You may want to shop before treatment to match your hair color. . Hats, turbans, and scarves can also camouflage hair loss, although some people prefer to leave their heads uncovered. If you go bare-headed outdoors, be sure to use sunscreen on your scalp. . Cut your hair short. It eases the inconvenience of shedding lots of hair, but it also can reduce the emotional impact of watching your hair fall out. . Don't perm or color your hair during chemotherapy. Those chemical treatments are already damaging to hair and can enhance hair loss. Once your chemo treatments are done and your hair has grown back, it's OK to resume dyeing or perming hair.  With chemotherapy, hair loss is almost always temporary. But when it grows back, it may be a different color or texture. In older adults who still had hair color before chemotherapy, the new growth may be completely gray.  Often, new hair is very fine and soft.  Infection Prevention Please wash your hands for at least 30 seconds using warm soapy water. Handwashing is the #1 way to prevent the spread of germs. Stay away from sick people or people who are getting over a cold. If you develop respiratory systems such as green/yellow mucus production or productive cough or persistent cough let us know and we will see if you need an antibiotic. It is a good idea to keep a pair of gloves on when going into grocery stores/[employer] to decrease your risk of coming into contact with germs on the carts, etc. Carry alcohol hand gel with you at all times and use it frequently if out in public. If your temperature reaches 100.4 or higher please call the clinic and let us know.  If it is after hours or on the weekend please go to the ER if your  temperature is over 100.4.  Please have your own personal thermometer at home to use.    Sex and bodily fluids If you are going to have sex, a condom must be used to protect the person that isn't taking chemotherapy. Chemo can decrease your libido (sex drive). For a few days after chemotherapy, chemotherapy can be excreted through your bodily fluids.  When using the toilet please close the lid and flush the toilet twice.  Do this for a few day after you have had chemotherapy.   Effects of chemotherapy on your sex life Some changes are simple and won't last long. They won't affect your sex life permanently.  Sometimes you may feel: . too  tired . not strong enough to be very active . sick or sore  . not in the mood . anxious or low  Your anxiety might not seem related to sex. For example, you may be worried about the cancer and how your treatment is going. Or you may be worried about money, or about how you family are coping with your illness.  These things can cause stress, which can affect your interest in sex. It's important to talk to your partner about how you feel.  Remember - the changes to your sex life don't usually last long. There's usually no medical reason to stop having sex during chemo. The drugs won't have any long term physical effects on your performance or enjoyment of sex. Cancer can't be passed on to your partner during sex  Contraception It's important to use reliable contraception during treatment. Avoid getting pregnant while you or your partner are having chemotherapy. This is because the drugs may harm the baby. Sometimes chemotherapy drugs can leave a man or woman infertile.  This means you would not be able to have children in the future. You might want to talk to someone about permanent infertility. It can be very difficult to learn that you may no longer be able to have children. Some people find counselling helpful. There might be ways to preserve your fertility,  although this is easier for men than for women. You may want to speak to a fertility expert. You can talk about sperm banking or harvesting your eggs. You can also ask about other fertility options, such as donor eggs. If you have or have had breast cancer, your doctor might advise you not to take the contraceptive pill. This is because the hormones in it might affect the cancer. It is not known for sure whether or not chemotherapy drugs can be passed on through semen or secretions from the vagina. Because of this some doctors advise people to use a barrier method if you have sex during treatment. This applies to vaginal, anal or oral sex. Generally, doctors advise a barrier method only for the time you are actually having the treatment and for about a week after your treatment. Advice like this can be worrying, but this does not mean that you have to avoid being intimate with your partner. You can still have close contact with your partner and continue to enjoy sex.  Animals If you have cats or birds we just ask that you not change the litter or change the cage.  Please have someone else do this for you while you are on chemotherapy.   Food Safety During and After Cancer Treatment Food safety is important for people both during and after cancer treatment. Cancer and cancer treatments, such as chemotherapy, radiation therapy, and stem cell/bone marrow transplantation, often weaken the immune system. This makes it harder for your body to protect itself from foodborne illness, also called food poisoning. Foodborne illness is caused by eating food that contains harmful bacteria, parasites, or viruses.  Foods to avoid Some foods have a higher risk of becoming tainted with bacteria. These include: Marland Kitchen Unwashed fresh fruit and vegetables, especially leafy vegetables that can hide dirt and other contaminants . Raw sprouts, such as alfalfa sprouts . Raw or undercooked beef, especially ground beef, or other raw or  undercooked meat and poultry . Fatty, fried, or spicy foods immediately before or after treatment.  These can sit heavy on your stomach and make you feel nauseous. . Raw or  undercooked shellfish, such as oysters. . Sushi and sashimi, which often contain raw fish.  . Unpasteurized beverages, such as unpasteurized fruit juices, raw milk, raw yogurt, or cider . Undercooked eggs, such as soft boiled, over easy, and poached; raw, unpasteurized eggs; or foods made with raw egg, such as homemade raw cookie dough and homemade mayonnaise  Simple steps for food safety  Shop smart. . Do not buy food stored or displayed in an unclean area. . Do not buy bruised or damaged fruits or vegetables. . Do not buy cans that have cracks, dents, or bulges. . Pick up foods that can spoil at the end of your shopping trip and store them in a cooler on the way home.  Prepare and clean up foods carefully. . Rinse all fresh fruits and vegetables under running water, and dry them with a clean towel or paper towel. . Clean the top of cans before opening them. . After preparing food, wash your hands for 20 seconds with hot water and soap. Pay special attention to areas between fingers and under nails. . Clean your utensils and dishes with hot water and soap. Marland Kitchen Disinfect your kitchen and cutting boards using 1 teaspoon of liquid, unscented bleach mixed into 1 quart of water.    Dispose of old food. . Eat canned and packaged food before its expiration date (the "use by" or "best before" date). . Consume refrigerated leftovers within 3 to 4 days. After that time, throw out the food. Even if the food does not smell or look spoiled, it still may be unsafe. Some bacteria, such as Listeria, can grow even on foods stored in the refrigerator if they are kept for too long.  Take precautions when eating out. . At restaurants, avoid buffets and salad bars where food sits out for a long time and comes in contact with many people.  Food can become contaminated when someone with a virus, often a norovirus, or another "bug" handles it. . Put any leftover food in a "to-go" container yourself, rather than having the server do it. And, refrigerate leftovers as soon as you get home. . Choose restaurants that are clean and that are willing to prepare your food as you order it cooked.   AT HOME MEDICATIONS:                                                                                                                                                                Compazine/Prochlorperazine 10mg  tablet. Take 1 tablet every 6 hours as needed for nausea/vomiting. (This can make you sleepy)   EMLA cream. Apply a quarter size amount to port site 1 hour prior to chemo. Do not rub in. Cover with plastic wrap.    Diarrhea Sheet   If you  are having loose stools/diarrhea, please purchase Imodium and begin taking as outlined:  At the first sign of poorly formed or loose stools you should begin taking Imodium (loperamide) 2 mg capsules.  Take two tablets (4mg ) followed by one tablet (2mg ) every 2 hours - DO NOT EXCEED 8 tablets in 24 hours.  If it is bedtime and you are having loose stools, take 2 tablets at bedtime, then 2 tablets every 4 hours until morning.   Always call the [facility] if you are having loose stools/diarrhea that you can't get under control.  Loose stools/diarrhea leads to dehydration (loss of water) in your body.  We have other options of trying to get the loose stools/diarrhea to stop but you must let us know!   Constipation Sheet  Colace - 100 mg capsules - take 2 capsules daily.  If this doesn't help then you can increase to 2 capsules twice daily.  Please call if the above does not work for you. Do not go more than 2 days without a bowel movement.  It is very important that you do not become constipated.  It will make you feel sick to your stomach (nausea) and can cause abdominal pain and vomiting.  Nausea  Sheet   Compazine/Prochlorperazine 10mg  tablet. Take 1 tablet every 6 hours as needed for nausea/vomiting (This can make you drowsy).  If you are having persistent nausea (nausea that does not stop) please call the [facility] and let us know the amount of nausea that you are experiencing.  If you begin to vomit, you need to call the [facility] and if it is the weekend and you have vomited more than one time and can't get it to stop-go to the Emergency Room.  Persistent nausea/vomiting can lead to dehydration (loss of fluid in your body) and will make you feel very weak and unwell. Ice chips, sips of clear liquids, foods that are at room temperature, crackers, and toast tend to be better tolerated.   SYMPTOMS TO REPORT AS SOON AS POSSIBLE AFTER TREATMENT:  FEVER GREATER THAN 100.4 F  CHILLS WITH OR WITHOUT FEVER  NAUSEA AND VOMITING THAT IS NOT CONTROLLED WITH YOUR NAUSEA MEDICATION  UNUSUAL SHORTNESS OF BREATH  UNUSUAL BRUISING OR BLEEDING  TENDERNESS IN MOUTH AND THROAT WITH OR WITHOUT PRESENCE OF ULCERS  URINARY PROBLEMS  BOWEL PROBLEMS  UNUSUAL RASH      Wear comfortable clothing and clothing appropriate for easy access to any Portacath or PICC line. Let us know if there is anything that we can do to make your therapy better!    What to do if you need assistance after hours or on the weekends: CALL [phone].  HOLD on the line, do not hang up.  You will hear multiple messages but at the end you will be connected with a nurse triage line.  They will contact the doctor if necessary.  Most of the time they will be able to assist you.  Do not call the hospital operator.      I have been informed and understand all of the instructions given to me and have received a copy. I have been instructed to call the clinic [phone] or my family physician as soon as possible for continued medical care, if indicated. I do not have any more questions at this time but understand  that I may call the [facility] or the Patient Navigator at [phone] during office hours should I have questions or need assistance  in obtaining follow-up care.

## 2020-06-04 NOTE — Procedures (Signed)
Interventional Radiology Procedure Note  Procedure: Placement of a right IJ approach single lumen PowerPort.  Tip is positioned at the superior cavoatrial junction and catheter is ready for immediate use.  Complications: None Recommendations:  - Ok to shower tomorrow - Do not submerge for 7 days - Routine line care   Signed,  Jordynn Perrier S. Danilo Cappiello, DO   

## 2020-06-05 ENCOUNTER — Encounter: Payer: Self-pay | Admitting: Licensed Clinical Social Worker

## 2020-06-05 ENCOUNTER — Inpatient Hospital Stay (HOSPITAL_BASED_OUTPATIENT_CLINIC_OR_DEPARTMENT_OTHER): Payer: Medicare Other | Admitting: Licensed Clinical Social Worker

## 2020-06-05 ENCOUNTER — Inpatient Hospital Stay (HOSPITAL_COMMUNITY): Payer: Medicare Other

## 2020-06-05 DIAGNOSIS — R971 Elevated cancer antigen 125 [CA 125]: Secondary | ICD-10-CM | POA: Diagnosis not present

## 2020-06-05 DIAGNOSIS — C561 Malignant neoplasm of right ovary: Secondary | ICD-10-CM

## 2020-06-05 DIAGNOSIS — Z23 Encounter for immunization: Secondary | ICD-10-CM | POA: Diagnosis not present

## 2020-06-05 LAB — CBC WITH DIFFERENTIAL/PLATELET
Abs Immature Granulocytes: 0.04 10*3/uL (ref 0.00–0.07)
Basophils Absolute: 0 10*3/uL (ref 0.0–0.1)
Basophils Relative: 0 %
Eosinophils Absolute: 0.7 10*3/uL — ABNORMAL HIGH (ref 0.0–0.5)
Eosinophils Relative: 10 %
HCT: 36 % (ref 36.0–46.0)
Hemoglobin: 11.7 g/dL — ABNORMAL LOW (ref 12.0–15.0)
Immature Granulocytes: 1 %
Lymphocytes Relative: 21 %
Lymphs Abs: 1.5 10*3/uL (ref 0.7–4.0)
MCH: 33.8 pg (ref 26.0–34.0)
MCHC: 32.5 g/dL (ref 30.0–36.0)
MCV: 104 fL — ABNORMAL HIGH (ref 80.0–100.0)
Monocytes Absolute: 0.7 10*3/uL (ref 0.1–1.0)
Monocytes Relative: 10 %
Neutro Abs: 4 10*3/uL (ref 1.7–7.7)
Neutrophils Relative %: 58 %
Platelets: 407 10*3/uL — ABNORMAL HIGH (ref 150–400)
RBC: 3.46 MIL/uL — ABNORMAL LOW (ref 3.87–5.11)
RDW: 13.5 % (ref 11.5–15.5)
WBC: 6.9 10*3/uL (ref 4.0–10.5)
nRBC: 0 % (ref 0.0–0.2)

## 2020-06-05 LAB — COMPREHENSIVE METABOLIC PANEL
ALT: 21 U/L (ref 0–44)
AST: 28 U/L (ref 15–41)
Albumin: 4.4 g/dL (ref 3.5–5.0)
Alkaline Phosphatase: 60 U/L (ref 38–126)
Anion gap: 9 (ref 5–15)
BUN: 16 mg/dL (ref 8–23)
CO2: 21 mmol/L — ABNORMAL LOW (ref 22–32)
Calcium: 9.3 mg/dL (ref 8.9–10.3)
Chloride: 99 mmol/L (ref 98–111)
Creatinine, Ser: 0.58 mg/dL (ref 0.44–1.00)
GFR, Estimated: >60 mL/min (ref >60)
Glucose, Bld: 88 mg/dL (ref 70–99)
Potassium: 3.8 mmol/L (ref 3.5–5.1)
Sodium: 129 mmol/L — ABNORMAL LOW (ref 135–145)
Total Bilirubin: 0.3 mg/dL (ref 0.3–1.2)
Total Protein: 7.6 g/dL (ref 6.5–8.1)

## 2020-06-05 NOTE — Progress Notes (Signed)
REFERRING PROVIDER: Dorothyann Gibbs, NP [place],  [place] [address]  PRIMARY PROVIDER:  Rory Percy, MD  PRIMARY REASON FOR VISIT:  1. 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 on 06/05/2020 at 10:00 AM EDT by MyChart video conference and verified that I am speaking with the correct person using two identifiers.    Patient location: home Provider location: [place]:   [name], a 74 y.o. female, was seen for a [facility] cancer genetics consultation at the request of April John, NP due to her recent diagnosis of ovarian cancer.  [name] presents to clinic today to discuss the possibility of a hereditary predisposition to cancer, genetic testing, and to further clarify her future cancer risks, as well as potential cancer risks for family members.   In 2021, at the age of 61, [name] was diagnosed with right ovarian epithelial cancer. She had TAH-BSO on 05/03/2020 and is currently being treated with adjuvant chemotherapy.    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)   05/03/2020 Cancer Staging   Staging form: Ovary, Fallopian Tube, and Primary Peritoneal Carcinoma, AJCC 8th Edition - Clinical stage from 05/03/2020: FIGO Stage II, calculated as Stage Unknown (cT2b, cNX, cM0) - Signed by April Amber, MD on 05/24/2020   06/06/2020 -  Chemotherapy   The patient had dexamethasone (DECADRON) 4 MG tablet, 4 mg (100 % of original dose 4 mg), Oral, Daily, 1 of 1 cycle, Start date: 05/29/2020, End date: -- Dose modification: 4 mg (original dose 4 mg, Cycle 0) palonosetron (ALOXI) injection 0.25 mg, 0.25 mg, Intravenous,  Once, 0 of 6 cycles pegfilgrastim-cbqv (UDENYCA) injection 6 mg, 6 mg, Subcutaneous, Once, 0 of 6 cycles CARBOplatin (PARAPLATIN) 500 mg in sodium chloride 0.9 % 250 mL chemo infusion, 500 mg (100  % of original dose 502.8 mg), Intravenous,  Once, 0 of 6 cycles Dose modification:   (original dose 502.8 mg, Cycle 1) fosaprepitant (EMEND) 150 mg in sodium chloride 0.9 % 145 mL IVPB, 150 mg, Intravenous,  Once, 0 of 6 cycles PACLitaxel (TAXOL) 306 mg in sodium chloride 0.9 % 500 mL chemo infusion (> 20m/m2), 175 mg/m2 = 306 mg, Intravenous,  Once, 0 of 6 cycles  for chemotherapy treatment.       RISK FACTORS:  Menarche was at age [age]  First live birth at age 74  OCP use for approximately 2 years.  Ovaries intact: no.  Hysterectomy: yes.  Menopausal status: postmenopausal.  HRT use: 2 years. Colonoscopy: yes; normal.  Number of breast biopsies: 1.   Past Medical History:  Diagnosis Date  . A-fib ([facility]   . Anxiety   . Arthritis   . Asthma    hx of   . Cancer ([facility]    ovarian  . Dyspnea    with exertion   . H/O seasonal allergies   . Heart murmur    as aa child   . Hypercholesteremia   . Hypertension   . Spinal stenosis     Past Surgical History:  Procedure Laterality Date  . ABDOMINAL HYSTERECTOMY    . BIOPSY  05/08/2020   Procedure: BIOPSY;  Surgeon: April Quale MD;  [facility];  Service: Gastroenterology;;  . BREAST SURGERY    . CARDIAC CATHETERIZATION    . CHOLECYSTECTOMY    .  COLONOSCOPY N/A 08/14/2015   Procedure: COLONOSCOPY;  Surgeon: April Signs, MD;  [facility];  Service: Gastroenterology;  Laterality: N/A;  . ESOPHAGOGASTRODUODENOSCOPY (EGD) WITH PROPOFOL N/A 05/08/2020   Procedure: ESOPHAGOGASTRODUODENOSCOPY (EGD) WITH PROPOFOL;  Surgeon: April Quale, MD;  [facility];  Service: Gastroenterology;  Laterality: N/A;  . IR IMAGING GUIDED PORT INSERTION  06/04/2020  . ROBOTIC ASSISTED TOTAL HYSTERECTOMY WITH BILATERAL SALPINGO OOPHERECTOMY N/A 05/03/2020   Procedure: XI ROBOTIC ASSISTED TOTAL HYSTERECTOMY WITH BILATERAL SALPINGO OOPHORECTOMY, OMENTECTOMY,RADICAL TUMOR DEBULKING, RIGID  PROSTOSCOPY;  Surgeon: April Amber, MD;  Location: [facility];  Service: Gynecology;  Laterality: N/A;  . THROMBECTOMY FEMORAL ARTERY Right 12/17/2013   Procedure: REPAIR OF RIGHT FEMORAL ARTERY PSEUDOANEURYSM;  Surgeon: April Dutch, MD;  Location: [facility];  Service: Vascular;  Laterality: Right;    Social History   Socioeconomic History  . Marital status: Married    Spouse name: Not on file  . Number of children: 4  . Years of education: Not on file  . Highest education level: Not on file  Occupational History  . Occupation: retired Marine scientist  Tobacco Use  . Smoking status: Never Smoker  . Smokeless tobacco: Never Used  Vaping Use  . Vaping Use: Never used  Substance and Sexual Activity  . Alcohol use: No  . Drug use: No  . Sexual activity: Not Currently  Other Topics Concern  . Not on file  Social History Narrative  . Not on file   Social Determinants of Health   Financial Resource Strain: Low Risk   . Difficulty of Paying Living Expenses: Not hard at all  Food Insecurity: No Food Insecurity  . Worried About Charity fundraiser in the Last Year: Never true  . Ran Out of Food in the Last Year: Never true  Transportation Needs: No Transportation Needs  . Lack of Transportation (Medical): No  . Lack of Transportation (Non-Medical): No  Physical Activity: Insufficiently Active  . Days of Exercise per Week: 7 days  . Minutes of Exercise per Session: 10 min  Stress: No Stress Concern Present  . Feeling of Stress : Not at all  Social Connections: Moderately Integrated  . Frequency of Communication with Friends and Family: More than three times a week  . Frequency of Social Gatherings with Friends and Family: More than three times a week  . Attends Religious Services: More than 4 times per year  . Active Member of Clubs or Organizations: No  . Attends Archivist Meetings: Never  . Marital Status: Married     FAMILY HISTORY:  We obtained a detailed, 4-generation  family history.  Significant diagnoses are listed below: Family History  Problem Relation Age of Onset  . Hypertension Mother   . Heart failure Mother   . Asthma Mother   . Ulcers Mother   . Benign prostatic hyperplasia Father   . Ulcers Father   . Esophageal varices Father   . Atrial fibrillation Sister   . Atrial fibrillation Brother   . Heart disease Brother   . Melanoma Maternal Aunt   . Colon cancer Neg Hx    [name] has 1 son and 3 daughters, none have had cancer. She had 3 brothers and 2 sisters, no cancers.   [name] father died at 39 and did not have siblings. No known cancers in paternal grandparents; grandmother died at 3, grandfather died at 44 due to TB.   [name] mother died at 83,  no cancers. Patient had 5 maternal aunts, 2 maternal uncles. One aunt did have melanoma removed from her leg. Maternal grandfather died at 9. Grandmother died at 25 but patient is unaware of her cause of death.  [name] is unaware of previous family history of genetic testing for hereditary cancer risks. Patient's maternal ancestors are of Scottish/Irish descent, and paternal ancestors are of [place] descent. There is no reported Ashkenazi Jewish ancestry. There is no known consanguinity.    GENETIC COUNSELING ASSESSMENT: [name] is a 74 y.o. female with a personal history of ovarian cancer which is somewhat suggestive of a hereditary cancer syndrome and predisposition to cancer. We, therefore, discussed and recommended the following at today's visit.   DISCUSSION: We discussed that approximately 15-20% of ovarian cancer is hereditary  Most cases of hereditary ovarian cancer are associated with BRCA1/BRCA2 genes, although there are other genes associated with hereditary cancer as well. We discussed that testing is beneficial for several reasons including knowing if certain targeted therapies would be appropriate,  knowing about other cancer risks, identifying potential screening and  risk-reduction options that may be appropriate, and to understand if other family members could be at risk for cancer and allow them to undergo genetic testing.   We reviewed the characteristics, features and inheritance patterns of hereditary cancer syndromes. We also discussed genetic testing, including the appropriate family members to test, the process of testing, insurance coverage and turn-around-time for results. We discussed the implications of a negative, positive and/or variant of uncertain significant result. We recommended [name] pursue genetic testing for the [facility] CancerNext+TumorNext+HRD gene panel.   We discussed that genetic testing through [facility] will test for hereditary mutations that could explain her diagnosis of cancer.  However, homologous recombination testing (HRD) is genetic testing performed on the tumor that can determine genetic changes that could influence her management such as eligibility for targeted therapies.  HRD testing is performed in tandem with genetic testing, and typically at no additional cost.    The CancerNext+RNAinsight gene panel offered by [facility] includes sequencing and rearrangement analysis for the following 36 genes: APC*, ATM*, AXIN2, BARD1, BMPR1A, BRCA1*, BRCA2*, BRIP1*, CDH1*, CDK4, CDKN2A, CHEK2*, DICER1, MLH1*, MSH2*, MSH3, MSH6*, MUTYH*, NBN, NF1*, NTHL1, PALB2*, PMS2*, PTEN*, RAD51C*, RAD51D*, RECQL, SMAD4, SMARCA4, STK11 and TP53* (sequencing and deletion/duplication); HOXB13, POLD1 and POLE (sequencing only); EPCAM and GREM1 (deletion/duplication only). DNA and RNA analyses performed for * genes.   Somatic genes analyzed through TumorNext-HRD: ATM, BARD1, BRCA1, BRCA2, BRIP1, CHEK2, MRE11A, NBN, PALB2, RAD51C, RAD51D.  Based on Ms. Hartog's personal history of cancer, she meets medical criteria for genetic testing. Despite that she meets criteria, she may still have an out of pocket cost. We discussed that if her out of pocket cost  for testing is over $100, the laboratory will call and confirm whether she wants to proceed with testing.  If the out of pocket cost of testing is less than $100 she will be billed by the genetic testing laboratory.   PLAN: After considering the risks, benefits, and limitations, Ms. Whidbee provided informed Guerra to pursue genetic testing and the blood sample was sent to [facility] for analysis of the CancerNext+TumorNext-HRD. Results should be available within approximately 6-8 weeks' time, at which point they will be disclosed by telephone to Ms. Welton, as will any additional recommendations warranted by these results. Ms. Kirst will receive a summary of her genetic counseling visit and a copy of her results once available. This information will  also be available in Epic.   Ms. Fikes's questions were answered to her satisfaction today. Our contact information was provided should additional questions or concerns arise. Thank you for the referral and allowing us to share in the care of your patient.   Brianna Cowan, MS, LCGC Genetic Counselor [email or web address] Phone: [phone]  The patient was seen for a total of 35 minutes in face-to-face genetic counseling.  Dr. Finnegan was available for discussion regarding this case.   _______________________________________________________________________ For Office Staff:  Number of people involved in session: 1 Was an Intern/ student involved with case: no  

## 2020-06-05 NOTE — Progress Notes (Signed)

## 2020-06-06 ENCOUNTER — Other Ambulatory Visit (HOSPITAL_COMMUNITY): Payer: Medicare Other

## 2020-06-06 ENCOUNTER — Inpatient Hospital Stay (HOSPITAL_BASED_OUTPATIENT_CLINIC_OR_DEPARTMENT_OTHER): Payer: Medicare Other | Admitting: Oncology

## 2020-06-06 ENCOUNTER — Encounter (HOSPITAL_COMMUNITY): Payer: Self-pay

## 2020-06-06 ENCOUNTER — Inpatient Hospital Stay (HOSPITAL_COMMUNITY): Payer: Medicare Other

## 2020-06-06 ENCOUNTER — Other Ambulatory Visit: Payer: Self-pay

## 2020-06-06 VITALS — BP 177/76 | HR 80 | Temp 97.3°F | Resp 18 | Wt 162.7 lb

## 2020-06-06 VITALS — BP 152/71 | HR 70 | Temp 96.8°F | Resp 18

## 2020-06-06 DIAGNOSIS — C561 Malignant neoplasm of right ovary: Secondary | ICD-10-CM

## 2020-06-06 DIAGNOSIS — Z23 Encounter for immunization: Secondary | ICD-10-CM | POA: Diagnosis not present

## 2020-06-06 DIAGNOSIS — R971 Elevated cancer antigen 125 [CA 125]: Secondary | ICD-10-CM | POA: Diagnosis not present

## 2020-06-06 MED ORDER — HEPARIN SOD (PORK) LOCK FLUSH 100 UNIT/ML IV SOLN
500.0000 [IU] | Freq: Once | INTRAVENOUS | Status: AC | PRN
  Administered 2020-06-06: 16:00:00 500 [IU]

## 2020-06-06 MED ORDER — DIPHENHYDRAMINE HCL 50 MG/ML IJ SOLN
INTRAMUSCULAR | Status: AC
  Filled 2020-06-06: qty 1

## 2020-06-06 MED ORDER — PALONOSETRON HCL INJECTION 0.25 MG/5ML
0.2500 mg | Freq: Once | INTRAVENOUS | Status: AC
  Administered 2020-06-06: 09:00:00 0.25 mg via INTRAVENOUS

## 2020-06-06 MED ORDER — PALONOSETRON HCL INJECTION 0.25 MG/5ML
INTRAVENOUS | Status: AC
  Filled 2020-06-06: qty 5

## 2020-06-06 MED ORDER — FAMOTIDINE IN NACL 20-0.9 MG/50ML-% IV SOLN
INTRAVENOUS | Status: AC
  Filled 2020-06-06: qty 50

## 2020-06-06 MED ORDER — SODIUM CHLORIDE 0.9 % IV SOLN
20.0000 mg | Freq: Once | INTRAVENOUS | Status: AC
  Administered 2020-06-06: 10:00:00 20 mg via INTRAVENOUS
  Filled 2020-06-06: qty 20

## 2020-06-06 MED ORDER — DIPHENHYDRAMINE HCL 50 MG/ML IJ SOLN
25.0000 mg | Freq: Once | INTRAMUSCULAR | Status: AC
  Administered 2020-06-06: 09:00:00 25 mg via INTRAVENOUS

## 2020-06-06 MED ORDER — FAMOTIDINE IN NACL 20-0.9 MG/50ML-% IV SOLN
20.0000 mg | Freq: Once | INTRAVENOUS | Status: AC
  Administered 2020-06-06: 10:00:00 20 mg via INTRAVENOUS

## 2020-06-06 MED ORDER — SODIUM CHLORIDE 0.9 % IV SOLN
150.0000 mg | Freq: Once | INTRAVENOUS | Status: AC
  Administered 2020-06-06: 10:00:00 150 mg via INTRAVENOUS
  Filled 2020-06-06: qty 150

## 2020-06-06 MED ORDER — SODIUM CHLORIDE 0.9 % IV SOLN: Freq: Once | INTRAVENOUS | Status: AC

## 2020-06-06 MED ORDER — SODIUM CHLORIDE 0.9% FLUSH
10.0000 mL | INTRAVENOUS | Status: DC | PRN
  Administered 2020-06-06: 09:00:00 10 mL

## 2020-06-06 MED ORDER — SODIUM CHLORIDE 0.9 % IV SOLN
497.4000 mg | Freq: Once | INTRAVENOUS | Status: AC
  Administered 2020-06-06: 15:00:00 500 mg via INTRAVENOUS
  Filled 2020-06-06: qty 50

## 2020-06-06 MED ORDER — SODIUM CHLORIDE 0.9 % IV SOLN
174.0000 mg/m2 | Freq: Once | INTRAVENOUS | Status: AC
  Administered 2020-06-06: 11:00:00 300 mg via INTRAVENOUS
  Filled 2020-06-06: qty 50

## 2020-06-06 NOTE — Progress Notes (Signed)
Patient here today for D1C1 Carbo Taxol per MD orders.  She is here with her husband.  She was seen today by Dr. Benay Spice and he is okay with her proceeding with treatment.  Her VS are within normal limits and labs are within parameters for treatment.  Port accessed without incidence.    1309:  Patient called out to the nurses that she had to go to the restroom.  Upon entering room patient stated she was hot and sweaty, her upper lip was sweaty.  Her chemotherapy was stopped and normal saline going at 157ml/hour.  She denies any shortness of breath, itching, tongue swelling.    1311:  Dr. Learta Codding at bedside.  Orders received for rate change back to 63 ml / hour for 15 minutes and if patient tolerates okay, she can go back up to 84 ml/hr.  Chemo restarted at 63 ml/hour.  Patient walked to restroom and feels better.    1317 : chemo restarted at 63 ml/hr. Patient sitting on side of bed with feet dangling.  She feels great.  Feeling of hot/sweaty has subsided.  She denies any issues at this time.    1318: saline back to 20 ml/hour, patient states she feels better.    1333:  Patient doing well, no issues at this time. Rate increased to 84 ml/hour.  She was advised to call out should she have any further issues.    Patient port deaccessed.  She was instructed to take her dexamethasone for the next three days.  She is to take it early in the morning so that it doesn't interfere with her sleep.  She was advised to take her compazine as needed for nausea.  She will come back on Friday for her injection and then next week for her nadir check.  She was discharged via wheelchair in stable condition with her husband.  She was instructed to call the cancer center should she have any questions or concerns.

## 2020-06-06 NOTE — Progress Notes (Signed)
April OFFICE PROGRESS NOTE   Diagnosis: Ovarian cancer  INTERVAL HISTORY:   April Guerra is here to begin adjuvant chemotherapy for treatment of stage II ovarian cancer.  She underwent Port-A-Cath placement on 06/04/2020. She was admitted with an acute upper GI bleed on 05/08/2020 secondary to a gastric ulcer. Apixaban was placed on hold. She remains off of anticoagulation therapy. No further bleeding.  Objective:  Vital signs in last 24 hours:  Blood pressure (!) 177/76, pulse 80, temperature (!) 97.3 F (36.3 C), temperature source Temporal, resp. rate 18, weight 162 lb 11.2 oz (73.8 kg), SpO2 100 %.    Resp: Lungs clear bilaterally Cardio: Regular rate and rhythm GI: No hepatosplenomegaly, healed surgical incisions, soft, nontender Vascular: No leg edema    Portacath/PICC-without erythema  Lab Results:  Lab Results  Component Value Date   WBC 6.9 06/05/2020   HGB 11.7 (L) 06/05/2020   HCT 36.0 06/05/2020   MCV 104.0 (H) 06/05/2020   PLT 407 (H) 06/05/2020   NEUTROABS 4.0 06/05/2020    CMP  Lab Results  Component Value Date   NA 129 (L) 06/05/2020   K 3.8 06/05/2020   CL 99 06/05/2020   CO2 21 (L) 06/05/2020   GLUCOSE 88 06/05/2020   BUN 16 06/05/2020   CREATININE 0.58 06/05/2020   CALCIUM 9.3 06/05/2020   PROT 7.6 06/05/2020   ALBUMIN 4.4 06/05/2020   AST 28 06/05/2020   ALT 21 06/05/2020   ALKPHOS 60 06/05/2020   BILITOT 0.3 06/05/2020   GFRNONAA >60 06/05/2020   GFRAA >60 04/29/2019     Imaging:  IR IMAGING GUIDED PORT INSERTION  Result Date: 06/04/2020 INDICATION: 74 year old female referred for port catheter placement EXAM: IMPLANTED PORT A CATH PLACEMENT WITH ULTRASOUND AND FLUOROSCOPIC GUIDANCE MEDICATIONS: 2 g Ancef; The antibiotic was administered within an appropriate time interval prior to skin puncture. ANESTHESIA/SEDATION: Moderate (conscious) sedation was employed during this procedure. A total of Versed 2.0 mg and  Fentanyl 75 mcg was administered intravenously. Moderate Sedation Time: 17 minutes. The patient's level of consciousness and vital signs were monitored continuously by radiology nursing throughout the procedure under my direct supervision. FLUOROSCOPY TIME:  0 minutes, 12 seconds (1 mGy) COMPLICATIONS: None PROCEDURE: The procedure, risks, benefits, and alternatives were explained to the patient. Questions regarding the procedure were encouraged and answered. The patient understands and consents to the procedure. Ultrasound survey was performed with images stored and sent to PACs. The right neck and chest was prepped with chlorhexidine, and draped in the usual sterile fashion using maximum barrier technique (cap and mask, sterile gown, sterile gloves, large sterile sheet, hand hygiene and cutaneous antiseptic). Antibiotic prophylaxis was provided with 2.0g Ancef administered IV one hour prior to skin incision. Local anesthesia was attained by infiltration with 1% lidocaine without epinephrine. Ultrasound demonstrated patency of the right internal jugular vein, and this was documented with an image. Under real-time ultrasound guidance, this vein was accessed with a 21 gauge micropuncture needle and image documentation was performed. A small dermatotomy was made at the access site with an 11 scalpel. A 0.018" wire was advanced into the SVC and used to estimate the length of the internal catheter. The access needle exchanged for a 75F micropuncture vascular sheath. The 0.018" wire was then removed and a 0.035" wire advanced into the IVC. An appropriate location for the subcutaneous reservoir was selected below the clavicle and an incision was made through the skin and underlying soft tissues. The subcutaneous tissues  were then dissected using a combination of blunt and sharp surgical technique and a pocket was formed. A single lumen power injectable portacatheter was then tunneled through the subcutaneous tissues from  the pocket to the dermatotomy and the port reservoir placed within the subcutaneous pocket. The venous access site was then serially dilated and a peel away vascular sheath placed over the wire. The wire was removed and the port catheter advanced into position under fluoroscopic guidance. The catheter tip is positioned in the cavoatrial junction. This was documented with a spot image. The portacatheter was then tested and found to flush and aspirate well. The port was flushed with saline followed by 100 units/mL heparinized saline. The pocket was then closed in two layers using first subdermal inverted interrupted absorbable sutures followed by a running subcuticular suture. The epidermis was then sealed with Dermabond. The dermatotomy at the venous access site was also seal with Dermabond. Patient tolerated the procedure well and remained hemodynamically stable throughout. No complications encountered and no significant blood loss encountered IMPRESSION: Status post right IJ port catheter placement. Catheter ready for use. Signed, Dulcy Fanny. Dellia Nims, RPVI [facility] Radiology Electronically Signed   By: Corrie Mckusick D.O.   On: 06/04/2020 12:49    Medications: I have reviewed the patient's current medications.   Assessment/Plan:  1.  Ovarian cancer-robotic assisted total hysterectomy bilateral salpingo-oophorectomy, omentectomy, radical tumor debulking, and omentectomy 05/03/2020-clear cell carcinoma of the right ovary, 13.2 cm, carcinoma confined to the ovary, stage II based on infiltration into the pelvic sidewall  2.  Port-A-Cath placement 06/04/2020 3.  Atrial fibrillation, now maintained off of anticoagulation therapy 4. Admission with acute GI bleeding 05/08/2020, upper endoscopy confirmed a gastric ulcer-nonbleeding, biopsy negative for malignancy   Disposition: April Guerra appears well. She will complete cycle one Taxol/carboplatin today. I  reviewed potential toxicities associated with this regimen. She is scheduled to complete 3 days of home Decadron prophylaxis. She will use Tylenol as needed for bone pain.  She will return for a nadir CBC. April Guerra will be scheduled for an office visit and cycle two chemotherapy in 3 weeks.  Betsy Coder, MD  06/06/2020  8:34 AM

## 2020-06-06 NOTE — Patient Instructions (Signed)
Happy Birthday!!!   Please take your dexamethasone for the next three days. Take it early in the morning so that it doesn't interfere with your sleep.  You can take Compazine as needed for nausea.  If you need [place] for anything please don't hesitate to call.  We will see you later this week for your injection.

## 2020-06-07 ENCOUNTER — Encounter (HOSPITAL_COMMUNITY): Payer: Self-pay | Admitting: *Deleted

## 2020-06-07 ENCOUNTER — Encounter: Payer: Medicare Other | Admitting: Genetic Counselor

## 2020-06-07 NOTE — Progress Notes (Signed)
24 hour callback:    I attempted to call patient to see how she has tolerated the first 24 hours post chemotherapy treatment.  I tried both her house phone and mobile phone and did not get an answer.  I left her a voicemail and advised that if she has any questions or concerns to please call the cancer center.  I advised of her appointment tomorrow and told her we would assess any needs at that visit.

## 2020-06-08 ENCOUNTER — Inpatient Hospital Stay (HOSPITAL_COMMUNITY): Payer: Medicare Other

## 2020-06-08 ENCOUNTER — Other Ambulatory Visit: Payer: Self-pay

## 2020-06-08 VITALS — BP 161/85 | HR 84 | Temp 96.8°F | Resp 20

## 2020-06-08 DIAGNOSIS — R971 Elevated cancer antigen 125 [CA 125]: Secondary | ICD-10-CM | POA: Diagnosis not present

## 2020-06-08 DIAGNOSIS — C561 Malignant neoplasm of right ovary: Secondary | ICD-10-CM | POA: Diagnosis not present

## 2020-06-08 DIAGNOSIS — Z23 Encounter for immunization: Secondary | ICD-10-CM | POA: Diagnosis not present

## 2020-06-08 LAB — SURGICAL PATHOLOGY

## 2020-06-08 MED ORDER — PEGFILGRASTIM-CBQV 6 MG/0.6ML ~~LOC~~ SOSY
6.0000 mg | PREFILLED_SYRINGE | Freq: Once | SUBCUTANEOUS | Status: AC
  Administered 2020-06-08: 11:00:00 6 mg via SUBCUTANEOUS
  Filled 2020-06-08: qty 0.6

## 2020-06-08 NOTE — Progress Notes (Signed)
Patient tolerated Udenyca injection with no complaints voiced.  Site clean and dry with no bruising or swelling noted.  No complaints of pain.  Discharged with vital signs stable and no signs or symptoms of distress noted.  

## 2020-06-11 ENCOUNTER — Telehealth (INDEPENDENT_AMBULATORY_CARE_PROVIDER_SITE_OTHER): Payer: Self-pay | Admitting: Gastroenterology

## 2020-06-11 NOTE — Telephone Encounter (Signed)
Patient left voice mail message wanting to know if she is to continue taking Protonix every day - please advise - ph# [phone]

## 2020-06-12 ENCOUNTER — Encounter (HOSPITAL_COMMUNITY): Payer: Self-pay

## 2020-06-12 NOTE — Telephone Encounter (Signed)
Per Dr. Laural Golden patient needs to stay on the Protonix BID until after her procedure on 08/08/2019, which is to follow up on her gastric ulcer. She can take miralax daily and phasym prn, also will need an appt to follow up with Dr. Jenetta Downer first week in June 27, 2020@ 3:45pm.

## 2020-06-12 NOTE — Telephone Encounter (Signed)
I called and left a vm asking if the patient still needed our assistance, if so to please call our office.

## 2020-06-12 NOTE — Telephone Encounter (Signed)
She does not have a EGD until until 08/07/2020. Will arrange for office visit with Dr. Jenetta Downer early part of January 2022

## 2020-06-12 NOTE — Progress Notes (Signed)
Patient called stating that she is having numbness in her fingertips and feet. She states that it is not happening all the time. She states that she was told when she came for her first chemo to let us know of any side effects. Informed to keep a log of how often this is occurring and how long it is lasting. When she comes for appointment the doctor can adjust medication if that is what is causing this.

## 2020-06-12 NOTE — Telephone Encounter (Signed)
I spoke with the patient and she states she was given one refill on the Protonix and if needs to stay on it BID she will need more refills. Patient states she is still having some left upper Quadrant pain and gas and constipation, no nausea,vomiting,diarrhea, no black or bloody stools. She takes Miralax prn, had been taking the miralax and senna bid, but has since stopped the senna. She states she had her ovary removed recently and had chemo on Dec 15. She states she is schedule for an upper GI August 08, 2019. She states she is unsure,but feels like something is not right with her upper left side. Wondered if she needed a follow up appt with someone here soon. Please advise.

## 2020-06-12 NOTE — Telephone Encounter (Signed)
I called and left a message asked that the patient please return call. ( Also need to see if she needs [place] to send in refills on her Protonix BID and if so which pharmacy).

## 2020-06-13 ENCOUNTER — Other Ambulatory Visit (INDEPENDENT_AMBULATORY_CARE_PROVIDER_SITE_OTHER): Payer: Self-pay

## 2020-06-13 MED ORDER — PANTOPRAZOLE SODIUM 40 MG PO TBEC: 40.0000 mg | DELAYED_RELEASE_TABLET | Freq: Two times a day (BID) | ORAL | 1 refills | Status: DC

## 2020-06-13 NOTE — Telephone Encounter (Signed)
April Guerra per Dr. Olevia Perches request please place patient on the schedule for Dr. Jenetta Downer on 06/27/2020 at 3:45pm. Thanks

## 2020-06-13 NOTE — Telephone Encounter (Signed)
Patient is aware of all. 

## 2020-06-13 NOTE — Telephone Encounter (Signed)
New prescription for the the Protonix BID sent to the patient's [facility].

## 2020-06-14 ENCOUNTER — Other Ambulatory Visit: Payer: Self-pay

## 2020-06-14 ENCOUNTER — Other Ambulatory Visit (HOSPITAL_COMMUNITY): Payer: Self-pay

## 2020-06-14 ENCOUNTER — Inpatient Hospital Stay (HOSPITAL_COMMUNITY): Payer: Medicare Other | Admitting: Hematology and Oncology

## 2020-06-14 ENCOUNTER — Encounter (HOSPITAL_COMMUNITY): Payer: Self-pay | Admitting: Hematology and Oncology

## 2020-06-14 ENCOUNTER — Inpatient Hospital Stay (HOSPITAL_COMMUNITY): Payer: Medicare Other

## 2020-06-14 VITALS — BP 143/73 | HR 77 | Temp 98.3°F | Resp 18 | Wt 163.1 lb

## 2020-06-14 DIAGNOSIS — C561 Malignant neoplasm of right ovary: Secondary | ICD-10-CM

## 2020-06-14 DIAGNOSIS — R971 Elevated cancer antigen 125 [CA 125]: Secondary | ICD-10-CM | POA: Diagnosis not present

## 2020-06-14 DIAGNOSIS — Z23 Encounter for immunization: Secondary | ICD-10-CM | POA: Diagnosis not present

## 2020-06-14 LAB — CBC WITH DIFFERENTIAL/PLATELET
Band Neutrophils: 10 %
Basophils Absolute: 0 10*3/uL (ref 0.0–0.1)
Basophils Relative: 0 %
Eosinophils Absolute: 0.6 10*3/uL — ABNORMAL HIGH (ref 0.0–0.5)
Eosinophils Relative: 2 %
HCT: 34.8 % — ABNORMAL LOW (ref 36.0–46.0)
Hemoglobin: 11.2 g/dL — ABNORMAL LOW (ref 12.0–15.0)
Lymphocytes Relative: 20 %
Lymphs Abs: 6 10*3/uL — ABNORMAL HIGH (ref 0.7–4.0)
MCH: 33.5 pg (ref 26.0–34.0)
MCHC: 32.2 g/dL (ref 30.0–36.0)
MCV: 104.2 fL — ABNORMAL HIGH (ref 80.0–100.0)
Metamyelocytes Relative: 3 %
Monocytes Absolute: 2.4 10*3/uL — ABNORMAL HIGH (ref 0.1–1.0)
Monocytes Relative: 8 %
Myelocytes: 3 %
Neutro Abs: 18.8 10*3/uL — ABNORMAL HIGH (ref 1.7–7.7)
Neutrophils Relative %: 53 %
Platelets: 389 10*3/uL (ref 150–400)
Promyelocytes Relative: 1 %
RBC: 3.34 MIL/uL — ABNORMAL LOW (ref 3.87–5.11)
RDW: 13.5 % (ref 11.5–15.5)
WBC: 29.8 10*3/uL — ABNORMAL HIGH (ref 4.0–10.5)
nRBC: 0.4 % — ABNORMAL HIGH (ref 0.0–0.2)
nRBC: 1 /100 WBC — ABNORMAL HIGH

## 2020-06-14 NOTE — Progress Notes (Signed)
  [place] OFFICE PROGRESS NOTE   Diagnosis: Ovarian cancer  INTERVAL HISTORY:   April Guerra is here for FU after chemotherapy. She is doing well. No complaints except for numbness of her feet which has been bothersome for 3 days and she still has it. She is worried this may be permanent. She has some fatigue, arthralgias as well. No nausea, vomiting or diarrhea.  Objective:  Vital signs in last 24 hours:  Blood pressure (!) 169/76, pulse 77, temperature 98.3 F (36.8 C), temperature source Oral, resp. rate 18, weight 163 lb 1.6 oz (74 kg), SpO2 96 %.   Appearance: Alert, oriented. No distress Resp: Lungs clear bilaterally Cardio: Regular rate and rhythm GI: No hepatosplenomegaly, healed surgical incisions, soft, nontender Abdomen: soft, non tender, non distended. Extremities: No LE edema. Neuro: No focal deficit   Portacath/PICC-without erythema  Lab Results:  Lab Results  Component Value Date   WBC 29.8 (H) 06/14/2020   HGB 11.2 (L) 06/14/2020   HCT 34.8 (L) 06/14/2020   MCV 104.2 (H) 06/14/2020   PLT 389 06/14/2020   NEUTROABS 18.8 (H) 06/14/2020    CMP  Lab Results  Component Value Date   NA 129 (L) 06/05/2020   K 3.8 06/05/2020   CL 99 06/05/2020   CO2 21 (L) 06/05/2020   GLUCOSE 88 06/05/2020   BUN 16 06/05/2020   CREATININE 0.58 06/05/2020   CALCIUM 9.3 06/05/2020   PROT 7.6 06/05/2020   ALBUMIN 4.4 06/05/2020   AST 28 06/05/2020   ALT 21 06/05/2020   ALKPHOS 60 06/05/2020   BILITOT 0.3 06/05/2020   GFRNONAA >60 06/05/2020   GFRAA >60 04/29/2019     Imaging:  No results found.  Medications: I have reviewed the patient's current medications.   Assessment/Plan:  1.  Ovarian cancer-robotic assisted total hysterectomy bilateral salpingo-oophorectomy, omentectomy, radical tumor debulking, and omentectomy 05/03/2020-clear cell carcinoma of the right ovary, 13.2 cm, carcinoma confined to the ovary, stage II based on infiltration  into the pelvic sidewall  2.  Port-A-Cath placement 06/04/2020 3.  Atrial fibrillation, now maintained off of anticoagulation therapy 4. Admission with acute GI bleeding 05/08/2020, upper endoscopy confirmed a gastric ulcer-nonbleeding, biopsy negative for malignancy 5. Leukocytosis from neulasta, No concerns for infection 6. Persistent neuropathy, dose reduced taxol since patient doesn't want to try gabapentin. She understands this can be permanent 7. Anemia, stable since last visit.  RTC in 2 weeks before C2 of carbotaxol.   Benay Pike, MD  06/14/2020  10:20 AM

## 2020-06-21 ENCOUNTER — Telehealth (HOSPITAL_COMMUNITY): Payer: Self-pay

## 2020-06-21 ENCOUNTER — Other Ambulatory Visit (HOSPITAL_COMMUNITY): Payer: Self-pay

## 2020-06-21 MED ORDER — GABAPENTIN 100 MG PO CAPS: 100.0000 mg | ORAL_CAPSULE | Freq: Three times a day (TID) | ORAL | 0 refills | Status: DC

## 2020-06-21 NOTE — Telephone Encounter (Signed)
This nurse called patient about numbness in her feet.  Advised patient that Dr. Ellin Saba has ordered Gabapentin 100mg  3 times daily and the order was sent in to [facility] pharmacy for pick up.  Advised patient to call the clinic if any adverse reactions occur.  No further questions or concerns at this time.

## 2020-06-27 ENCOUNTER — Inpatient Hospital Stay (HOSPITAL_COMMUNITY): Payer: Medicare Other | Admitting: Hematology

## 2020-06-27 ENCOUNTER — Ambulatory Visit (HOSPITAL_COMMUNITY): Payer: Medicare Other

## 2020-06-27 ENCOUNTER — Ambulatory Visit (INDEPENDENT_AMBULATORY_CARE_PROVIDER_SITE_OTHER): Payer: Medicare Other | Admitting: Gastroenterology

## 2020-06-27 ENCOUNTER — Inpatient Hospital Stay (HOSPITAL_COMMUNITY): Payer: Medicare Other

## 2020-06-27 ENCOUNTER — Ambulatory Visit (HOSPITAL_COMMUNITY): Payer: Medicare Other | Admitting: Hematology

## 2020-06-27 ENCOUNTER — Inpatient Hospital Stay (HOSPITAL_COMMUNITY): Payer: Medicare Other | Attending: Hematology

## 2020-06-27 ENCOUNTER — Encounter (INDEPENDENT_AMBULATORY_CARE_PROVIDER_SITE_OTHER): Payer: Self-pay | Admitting: Gastroenterology

## 2020-06-27 ENCOUNTER — Other Ambulatory Visit (HOSPITAL_COMMUNITY): Payer: Medicare Other

## 2020-06-27 ENCOUNTER — Other Ambulatory Visit: Payer: Self-pay

## 2020-06-27 VITALS — BP 174/76 | HR 89 | Temp 96.2°F | Resp 18 | Wt 165.4 lb

## 2020-06-27 VITALS — BP 154/74 | HR 77 | Temp 96.9°F | Resp 18

## 2020-06-27 VITALS — BP 150/82 | HR 85 | Temp 97.6°F | Ht <= 58 in | Wt 165.0 lb

## 2020-06-27 DIAGNOSIS — Z7901 Long term (current) use of anticoagulants: Secondary | ICD-10-CM | POA: Diagnosis not present

## 2020-06-27 DIAGNOSIS — G629 Polyneuropathy, unspecified: Secondary | ICD-10-CM | POA: Diagnosis not present

## 2020-06-27 DIAGNOSIS — Z79899 Other long term (current) drug therapy: Secondary | ICD-10-CM | POA: Insufficient documentation

## 2020-06-27 DIAGNOSIS — Z5189 Encounter for other specified aftercare: Secondary | ICD-10-CM | POA: Insufficient documentation

## 2020-06-27 DIAGNOSIS — M7989 Other specified soft tissue disorders: Secondary | ICD-10-CM | POA: Diagnosis not present

## 2020-06-27 DIAGNOSIS — C561 Malignant neoplasm of right ovary: Secondary | ICD-10-CM

## 2020-06-27 DIAGNOSIS — Z5111 Encounter for antineoplastic chemotherapy: Secondary | ICD-10-CM | POA: Diagnosis not present

## 2020-06-27 DIAGNOSIS — Z836 Family history of other diseases of the respiratory system: Secondary | ICD-10-CM | POA: Insufficient documentation

## 2020-06-27 DIAGNOSIS — R200 Anesthesia of skin: Secondary | ICD-10-CM | POA: Insufficient documentation

## 2020-06-27 DIAGNOSIS — R5383 Other fatigue: Secondary | ICD-10-CM | POA: Diagnosis not present

## 2020-06-27 DIAGNOSIS — I4891 Unspecified atrial fibrillation: Secondary | ICD-10-CM | POA: Insufficient documentation

## 2020-06-27 DIAGNOSIS — Z8249 Family history of ischemic heart disease and other diseases of the circulatory system: Secondary | ICD-10-CM | POA: Insufficient documentation

## 2020-06-27 DIAGNOSIS — Z842 Family history of other diseases of the genitourinary system: Secondary | ICD-10-CM | POA: Diagnosis not present

## 2020-06-27 DIAGNOSIS — K5900 Constipation, unspecified: Secondary | ICD-10-CM | POA: Insufficient documentation

## 2020-06-27 DIAGNOSIS — K253 Acute gastric ulcer without hemorrhage or perforation: Secondary | ICD-10-CM | POA: Diagnosis not present

## 2020-06-27 DIAGNOSIS — K259 Gastric ulcer, unspecified as acute or chronic, without hemorrhage or perforation: Secondary | ICD-10-CM | POA: Insufficient documentation

## 2020-06-27 DIAGNOSIS — K922 Gastrointestinal hemorrhage, unspecified: Secondary | ICD-10-CM

## 2020-06-27 LAB — COMPREHENSIVE METABOLIC PANEL
ALT: 23 U/L (ref 0–44)
AST: 23 U/L (ref 15–41)
Albumin: 4 g/dL (ref 3.5–5.0)
Alkaline Phosphatase: 64 U/L (ref 38–126)
Anion gap: 9 (ref 5–15)
BUN: 17 mg/dL (ref 8–23)
CO2: 24 mmol/L (ref 22–32)
Calcium: 9.1 mg/dL (ref 8.9–10.3)
Chloride: 100 mmol/L (ref 98–111)
Creatinine, Ser: 0.55 mg/dL (ref 0.44–1.00)
GFR, Estimated: >60 mL/min (ref >60)
Glucose, Bld: 91 mg/dL (ref 70–99)
Potassium: 3.8 mmol/L (ref 3.5–5.1)
Sodium: 133 mmol/L — ABNORMAL LOW (ref 135–145)
Total Bilirubin: 0.5 mg/dL (ref 0.3–1.2)
Total Protein: 7.1 g/dL (ref 6.5–8.1)

## 2020-06-27 LAB — CBC WITH DIFFERENTIAL/PLATELET
Abs Immature Granulocytes: 0.03 10*3/uL (ref 0.00–0.07)
Basophils Absolute: 0 10*3/uL (ref 0.0–0.1)
Basophils Relative: 0 %
Eosinophils Absolute: 0.1 10*3/uL (ref 0.0–0.5)
Eosinophils Relative: 1 %
HCT: 32.4 % — ABNORMAL LOW (ref 36.0–46.0)
Hemoglobin: 10.7 g/dL — ABNORMAL LOW (ref 12.0–15.0)
Immature Granulocytes: 1 %
Lymphocytes Relative: 22 %
Lymphs Abs: 1.4 10*3/uL (ref 0.7–4.0)
MCH: 33.2 pg (ref 26.0–34.0)
MCHC: 33 g/dL (ref 30.0–36.0)
MCV: 100.6 fL — ABNORMAL HIGH (ref 80.0–100.0)
Monocytes Absolute: 0.9 10*3/uL (ref 0.1–1.0)
Monocytes Relative: 14 %
Neutro Abs: 3.9 10*3/uL (ref 1.7–7.7)
Neutrophils Relative %: 62 %
Platelets: 369 10*3/uL (ref 150–400)
RBC: 3.22 MIL/uL — ABNORMAL LOW (ref 3.87–5.11)
RDW: 13.7 % (ref 11.5–15.5)
WBC: 6.2 10*3/uL (ref 4.0–10.5)
nRBC: 0 % (ref 0.0–0.2)

## 2020-06-27 MED ORDER — SODIUM CHLORIDE 0.9 % IV SOLN
10.0000 mg | Freq: Once | INTRAVENOUS | Status: AC
  Administered 2020-06-27: 10 mg via INTRAVENOUS
  Filled 2020-06-27: qty 10

## 2020-06-27 MED ORDER — DIPHENHYDRAMINE HCL 50 MG/ML IJ SOLN
25.0000 mg | Freq: Once | INTRAMUSCULAR | Status: AC
  Administered 2020-06-27: 25 mg via INTRAVENOUS

## 2020-06-27 MED ORDER — FAMOTIDINE IN NACL 20-0.9 MG/50ML-% IV SOLN
INTRAVENOUS | Status: AC
  Filled 2020-06-27: qty 50

## 2020-06-27 MED ORDER — SODIUM CHLORIDE 0.9 % IV SOLN
150.0000 mg | Freq: Once | INTRAVENOUS | Status: AC
  Administered 2020-06-27: 150 mg via INTRAVENOUS
  Filled 2020-06-27: qty 150

## 2020-06-27 MED ORDER — SODIUM CHLORIDE 0.9% FLUSH
10.0000 mL | INTRAVENOUS | Status: DC | PRN
  Administered 2020-06-27: 10 mL

## 2020-06-27 MED ORDER — SODIUM CHLORIDE 0.9 % IV SOLN
497.4000 mg | Freq: Once | INTRAVENOUS | Status: AC
  Administered 2020-06-27: 500 mg via INTRAVENOUS
  Filled 2020-06-27: qty 50

## 2020-06-27 MED ORDER — SODIUM CHLORIDE 0.9 % IV SOLN
140.0000 mg/m2 | Freq: Once | INTRAVENOUS | Status: AC
  Administered 2020-06-27: 246 mg via INTRAVENOUS
  Filled 2020-06-27: qty 41

## 2020-06-27 MED ORDER — FAMOTIDINE IN NACL 20-0.9 MG/50ML-% IV SOLN
20.0000 mg | Freq: Once | INTRAVENOUS | Status: AC
Start: 2020-06-27 — End: 2020-06-27
  Administered 2020-06-27: 20 mg via INTRAVENOUS

## 2020-06-27 MED ORDER — HEPARIN SOD (PORK) LOCK FLUSH 100 UNIT/ML IV SOLN
500.0000 [IU] | Freq: Once | INTRAVENOUS | Status: AC | PRN
  Administered 2020-06-27: 500 [IU]

## 2020-06-27 MED ORDER — PALONOSETRON HCL INJECTION 0.25 MG/5ML
INTRAVENOUS | Status: AC
  Filled 2020-06-27: qty 5

## 2020-06-27 MED ORDER — DIPHENHYDRAMINE HCL 50 MG/ML IJ SOLN
INTRAMUSCULAR | Status: AC
  Filled 2020-06-27: qty 1

## 2020-06-27 MED ORDER — PALONOSETRON HCL INJECTION 0.25 MG/5ML
0.2500 mg | Freq: Once | INTRAVENOUS | Status: AC
  Administered 2020-06-27: 0.25 mg via INTRAVENOUS

## 2020-06-27 MED ORDER — SODIUM CHLORIDE 0.9 % IV SOLN: Freq: Once | INTRAVENOUS | Status: AC

## 2020-06-27 NOTE — Patient Instructions (Signed)
[facility] Discharge Instructions for Patients Receiving Chemotherapy  Today you received the following chemotherapy agents   To help prevent nausea and vomiting after your treatment, we encourage you to take your nausea medication   If you develop nausea and vomiting that is not controlled by your nausea medication, call the clinic.   BELOW ARE SYMPTOMS THAT SHOULD BE REPORTED IMMEDIATELY:  *FEVER GREATER THAN 100.5 F  *CHILLS WITH OR WITHOUT FEVER  NAUSEA AND VOMITING THAT IS NOT CONTROLLED WITH YOUR NAUSEA MEDICATION  *UNUSUAL SHORTNESS OF BREATH  *UNUSUAL BRUISING OR BLEEDING  TENDERNESS IN MOUTH AND THROAT WITH OR WITHOUT PRESENCE OF ULCERS  *URINARY PROBLEMS  *BOWEL PROBLEMS  UNUSUAL RASH Items with * indicate a potential emergency and should be followed up as soon as possible.  Feel free to call the clinic should you have any questions or concerns. The clinic phone number is [phone].  Please show the CHEMO ALERT CARD at check-in to the Emergency Department and triage nurse.   

## 2020-06-27 NOTE — Patient Instructions (Signed)
Continue pantoprazole 40 mg twice a day until your repeat endoscopy

## 2020-06-27 NOTE — Progress Notes (Signed)
Katrinka Blazing, M.D. Gastroenterology & Hepatology [facility] [address] [place], [place] [address]  Primary Care Physician: Selinda Flavin, MD [address] [place] [address]  I will communicate my assessment and recommendations to the referring MD via EMR.  Problems: 1. Gastric ulcer  History of Present Illness: April Guerra is a 75 y.o. female with past medical history of atrial fibrillation on Eliquis (stopped 2 weeks ago), hypertension, hyperlipidemia, asthma, anxiety, arthritis, and recent resection of large ovarian tumor on 05/03/2020   and on treatment with chemotherapy, who presents for follow up after recent hospitalization for upper gastrointestinal bleeding due to gastric ulcer.  Was admitted to the hospital on 05/08/2020 after presenting an episode of coffee-ground emesis and melena with concomitant lightheadedness.  She did not take any NSAIDs at that time but was taken Eliquis.  Patient was found to have a drop in her hemoglobin to 9.4 and elevated BUN.  Due to this she underwent an EGD that showed have a 1 cm nonbleeding ulcer without central eschar, presence of heaped up borders.  Tissue was friable.  Biopsies were taken from the edges of the ulcer and from antrum and body to rule out H. pylori.  There were also presence of coin shaped erythematous lesions in the gastric body without any evidence of active bleeding or ulceration.  Biopsies were negative for H. pylori and negative for malignancy or dysplasia.  H. pylori serology was negative.  Patient was discharged home on pantoprazole milligrams twice daily which she has been taking compliantly.  She states that after her discharge from the hospital she felt some discomfort in her left upper quadrant which has resolved since then.  Patient states feeling better at this moment and has been tolerating her chemotherapy.  Denies having any complaint.  Has not presented any more  episodes of melena or emesis. The patient denies having any nausea,  fever, chills, hematochezia, hematemesis, abdominal distention, abdominal pain, diarrhea, jaundice, pruritus or weight loss.  Her most recent lab results are from today which showed hemoglobin of 10.7 with MCV 100.6, white blood cell count of 6.2 and platelets of 369.  CMP showed normal BUN of 17 and creatinine of 0.5, normal liver enzymes.  Past Medical History: Past Medical History:  Diagnosis Date  . A-fib (HCC)   . Anxiety   . Arthritis   . Asthma    hx of   . Cancer (HCC)    ovarian  . Dyspnea    with exertion   . H/O seasonal allergies   . Heart murmur    as aa child   . Hypercholesteremia   . Hypertension   . Spinal stenosis     Past Surgical History: Past Surgical History:  Procedure Laterality Date  . ABDOMINAL HYSTERECTOMY    . BIOPSY  05/08/2020   Procedure: BIOPSY;  Surgeon: Dolores Frame, MD;  Location: [facility];  Service: Gastroenterology;;  . BREAST SURGERY    . CARDIAC CATHETERIZATION    . CHOLECYSTECTOMY    . COLONOSCOPY N/A 08/14/2015   Procedure: COLONOSCOPY;  Surgeon: Franky Macho, MD;  Location: [facility];  Service: Gastroenterology;  Laterality: N/A;  . ESOPHAGOGASTRODUODENOSCOPY (EGD) WITH PROPOFOL N/A 05/08/2020   Procedure: ESOPHAGOGASTRODUODENOSCOPY (EGD) WITH PROPOFOL;  Surgeon: Dolores Frame, MD;  Location: [facility];  Service: Gastroenterology;  Laterality: N/A;  . IR IMAGING GUIDED PORT INSERTION  06/04/2020  . ROBOTIC ASSISTED TOTAL HYSTERECTOMY WITH BILATERAL  SALPINGO OOPHERECTOMY N/A 05/03/2020   Procedure: XI ROBOTIC ASSISTED TOTAL HYSTERECTOMY WITH BILATERAL SALPINGO OOPHORECTOMY, OMENTECTOMY,RADICAL TUMOR DEBULKING, RIGID PROSTOSCOPY;  Surgeon: Everitt Amber, MD;  Location: [facility];  Service: Gynecology;  Laterality: N/A;  . THROMBECTOMY FEMORAL ARTERY Right 12/17/2013   Procedure: REPAIR OF RIGHT FEMORAL ARTERY PSEUDOANEURYSM;  Surgeon:  Elam Dutch, MD;  Location: [facility];  Service: Vascular;  Laterality: Right;    Family History: Family History  Problem Relation Age of Onset  . Hypertension Mother   . Heart failure Mother   . Asthma Mother   . Ulcers Mother   . Benign prostatic hyperplasia Father   . Ulcers Father   . Esophageal varices Father   . Atrial fibrillation Sister   . Atrial fibrillation Brother   . Heart disease Brother   . Melanoma Maternal Aunt   . Colon cancer Neg Hx     Social History: Social History   Tobacco Use  Smoking Status Never Smoker  Smokeless Tobacco Never Used   Social History   Substance and Sexual Activity  Alcohol Use No   Social History   Substance and Sexual Activity  Drug Use No    Allergies: Allergies  Allergen Reactions  . Morphine And Related Nausea And Vomiting  . Zofran [Ondansetron]     Causes minor constipation, tolerates low doses      Medications: Current Outpatient Medications  Medication Sig Dispense Refill  . acetaminophen (TYLENOL) 500 MG tablet Take 500-1,000 mg by mouth every 6 (six) hours as needed for moderate pain.    Marland Kitchen albuterol (VENTOLIN HFA) 108 (90 Base) MCG/ACT inhaler Inhale 2 puffs into the lungs every 4 (four) hours as needed for wheezing or shortness of breath. 18 g 2  . amLODipine (NORVASC) 10 MG tablet Take 1 tablet (10 mg total) by mouth daily with lunch. For BP 30 tablet 3  . Calcium Carb-Cholecalciferol (CALCIUM 600/VITAMIN D PO) Take 1 tablet by mouth in the morning and at bedtime.    Marland Kitchen CARBOPLATIN IV Inject into the vein every 21 ( twenty-one) days.    . cetirizine (ZYRTEC) 10 MG tablet Take 10 mg by mouth daily.    . Cholecalciferol (VITAMIN D) 125 MCG (5000 UT) CAPS Take 5,000 Units by mouth daily.    . Coenzyme Q10 (COQ10) 100 MG CAPS Take 100 mg by mouth at bedtime.     . cyclobenzaprine (FLEXERIL) 10 MG tablet Take 5 mg by mouth See admin instructions. Take 5 mg at night, may take a second 5 mg dose during the day as  needed for muscle spasms    . dexamethasone (DECADRON) 4 MG tablet Take 1 tablet (4 mg total) by mouth daily. Start the day after carboplatin chemotherapy for 3 days. 20 tablet 0  . diltiazem (CARDIZEM) 30 MG tablet Take 1 tablet every 4 hours AS NEEDED for AFIB heart rate >100 (Patient taking differently: Take 30 mg by mouth every 4 (four) hours as needed (AFIB heart rate > 100).) 30 tablet 3  . fluticasone (FLONASE) 50 MCG/ACT nasal spray Place 1 spray into both nostrils daily.     Marland Kitchen gabapentin (NEURONTIN) 100 MG capsule Take 1 capsule (100 mg total) by mouth 3 (three) times daily. 90 capsule 0  . lidocaine-prilocaine (EMLA) cream Apply to affected area once 30 g 3  . lisinopril (ZESTRIL) 30 MG tablet Take 1 tablet (30 mg total) by mouth in the morning. Okay to restart around Monday, 05/14/2020 if blood pressure stable 30 tablet  3  . Magnesium 100 MG TABS Take 50 mg by mouth at bedtime.    . melatonin 5 MG TABS Take 2.5 mg by mouth at bedtime.     . metoprolol succinate (TOPROL-XL) 100 MG 24 hr tablet Take 100 mg by mouth in the morning. Take with or immediately following a meal.    . Multiple Vitamins-Minerals (CENTRUM SILVER ADULT 50+ PO) Take 1 tablet by mouth daily.    . Omega-3 Fatty Acids (FISH OIL) 1000 MG CAPS Take 1,000 mg by mouth daily.    . ondansetron (ZOFRAN) 4 MG tablet Take 1 tablet (4 mg total) by mouth every 6 (six) hours as needed for nausea. (Patient taking differently: Take 2 mg by mouth every 6 (six) hours as needed for nausea.) 20 tablet 0  . PACLITAXEL IV Inject into the vein every 21 ( twenty-one) days.    . pantoprazole (PROTONIX) 40 MG tablet Take 1 tablet (40 mg total) by mouth 2 (two) times daily before a meal. 60 tablet 1  . polyethylene glycol powder (GLYCOLAX/MIRALAX) 17 GM/SCOOP powder Take 17 g by mouth daily with lunch.    . prochlorperazine (COMPAZINE) 10 MG tablet Take 1 tablet (10 mg total) by mouth every 6 (six) hours as needed (Nausea or vomiting). 30 tablet  1  . simvastatin (ZOCOR) 40 MG tablet Take 0.5 tablets (20 mg total) by mouth daily. (Patient taking differently: Take 40 mg by mouth at bedtime.) 30 tablet 2  . sodium chloride (OCEAN) 0.65 % SOLN nasal spray Place 1 spray into both nostrils See admin instructions. Use 1 spray in each nostril in the morning may use a second dose at night as needed for congestion    . tetrahydrozoline-zinc (VISINE-AC) 0.05-0.25 % ophthalmic solution Place 1 drop into both eyes daily.    Marland Kitchen triamcinolone (KENALOG) 0.1 % Apply 1 application topically daily as needed (irritation).    . vitamin C (ASCORBIC ACID) 500 MG tablet Take 500 mg by mouth daily.    . Vitamins A & D (VITAMIN A & D) ointment Apply 1 application topically as needed (irritation).    . ondansetron (ZOFRAN) 8 MG tablet Take 1 tablet (8 mg total) by mouth 2 (two) times daily as needed for refractory nausea / vomiting. Start on day 3 after carboplatin chemo. (Patient not taking: Reported on 06/27/2020) 30 tablet 1  . senna (SENOKOT) 8.6 MG tablet Take 1 tablet by mouth 2 (two) times daily. (Patient not taking: Reported on 06/27/2020)     No current facility-administered medications for this visit.    Review of Systems: GENERAL: negative for malaise, night sweats HEENT: No changes in hearing or vision, no nose bleeds or other nasal problems. NECK: Negative for lumps, goiter, pain and significant neck swelling RESPIRATORY: Negative for cough, wheezing CARDIOVASCULAR: Negative for chest pain, leg swelling, palpitations, orthopnea GI: SEE HPI MUSCULOSKELETAL: Negative for joint pain or swelling, back pain, and muscle pain. SKIN: Negative for lesions, rash PSYCH: Negative for sleep disturbance, mood disorder and recent psychosocial stressors. HEMATOLOGY Negative for prolonged bleeding, bruising easily, and swollen nodes. ENDOCRINE: Negative for cold or heat intolerance, polyuria, polydipsia and goiter. NEURO: negative for tremor, gait imbalance, syncope  and seizures. The remainder of the review of systems is noncontributory.   Physical Exam: BP (!) 150/82 (BP Location: Left Arm, Patient Position: Sitting, Cuff Size: Large)   Pulse 85   Temp 97.6 F (36.4 C) (Oral)   Ht 4\' 10"  (1.473 m)   Wt 165 lb (  74.8 kg)   BMI 34.49 kg/m  GENERAL: The patient is AO x3, in no acute distress. Obese. HEENT: Head is normocephalic and atraumatic. EOMI are intact. Mouth is well hydrated and without lesions. NECK: Supple. No masses LUNGS: Clear to auscultation. No presence of rhonchi/wheezing/rales. Adequate chest expansion HEART: RRR, normal s1 and s2. ABDOMEN: Soft, nontender, no guarding, no peritoneal signs, and nondistended. BS +. No masses. EXTREMITIES: Without any cyanosis, clubbing, rash, lesions or edema. NEUROLOGIC: AOx3, no focal motor deficit. SKIN: no jaundice, no rashes  Imaging/Labs: as above  I personally reviewed and interpreted the available labs, imaging and endoscopic files.  Impression and Plan: DELAIAH PAM is a 75 y.o. female with past medical history of atrial fibrillation on Eliquis (stopped 2 weeks ago), hypertension, hyperlipidemia, asthma, anxiety, arthritis, and recent resection of large ovarian tumor on 05/03/2020   and on treatment with chemotherapy, who presents for follow up after recent hospitalization for upper gastrointestinal bleeding due to gastric ulcer.  Has presented clinical stability while taking PPI twice a day, without episodes of recurrent bleeding clinically.  She had a recent CBC that showed mild drop in her hemoglobin, but these could be related to her chemotherapy.  At this point, I advised her that she should continue taking her PPI twice a day until her scheduled repeat EGD in February.  If there is adequate healing of her ulcer, will need to continue taking pantoprazole once a day indefinitely as she is on anticoagulant.  The patient understood and agreed.  - Proceed with repeat EGD in February 2022 -  Continue pantoprazole 40 mg twice a day, if adequate ulcer healing will need to continue once a day indefinitely  All questions were answered.      Harvel Quale, MD Gastroenterology and Hepatology [facility]

## 2020-06-27 NOTE — Patient Instructions (Addendum)
[facility] at [facility] Discharge Instructions  You were seen today by Dr. Katragadda. He went over your recent results. You received your treatment today. Dr. Katragadda will see you back in 3 weeks for labs and follow up.   Thank you for choosing [facility] at [facility] to provide your oncology and hematology care.  To afford each patient quality time with our provider, please arrive at least 15 minutes before your scheduled appointment time.   If you have a lab appointment with the Cancer Center please come in thru the Main Entrance and check in at the main information desk  You need to re-schedule your appointment should you arrive 10 or more minutes late.  We strive to give you quality time with our providers, and arriving late affects you and other patients whose appointments are after yours.  Also, if you no show three or more times for appointments you may be dismissed from the clinic at the providers discretion.     Again, thank you for choosing [facility].  Our hope is that these requests will decrease the amount of time that you wait before being seen by our physicians.       _____________________________________________________________  Should you have questions after your visit to [facility], please contact our office at [phone] between the hours of 8:00 a.m. and 4:30 p.m.  Voicemails left after 4:00 p.m. will not be returned until the following business day.  For prescription refill requests, have your pharmacy contact our office and allow 72 hours.    Cancer Center Support Programs:   > Cancer Support Group  2nd Tuesday of the month 1pm-2pm, Journey Room    

## 2020-06-27 NOTE — Progress Notes (Signed)
[place], [place] [address]   CLINIC:  Medical Oncology/Hematology  PCP:  Rory Percy, MD [address] [place] [id number] [phone]   REASON FOR VISIT:  Follow-up for right ovarian cancer  PRIOR THERAPY: TAH-BSO on 05/03/2020  NGS Results: Not done  CURRENT THERAPY: Carboplatin, paclitaxel and Aloxi every 3 weeks  BRIEF ONCOLOGIC HISTORY:  Oncology History  Right ovarian epithelial cancer ([place])  05/03/2020 Initial Diagnosis   Right ovarian epithelial cancer ([place])   05/03/2020 Cancer Staging   Staging form: Ovary, Fallopian Tube, and Primary Peritoneal Carcinoma, AJCC 8th Edition - Clinical stage from 05/03/2020: FIGO Stage II, calculated as Stage Unknown (cT2b, cNX, cM0) - Signed by Everitt Amber, MD on 05/24/2020   06/06/2020 -  Chemotherapy   The patient had dexamethasone (DECADRON) 4 MG tablet, 4 mg (100 % of original dose 4 mg), Oral, Daily, 1 of 1 cycle, Start date: 05/29/2020, End date: -- Dose modification: 4 mg (original dose 4 mg, Cycle 0) palonosetron (ALOXI) injection 0.25 mg, 0.25 mg, Intravenous,  Once, 1 of 6 cycles Administration: 0.25 mg (06/06/2020) pegfilgrastim-cbqv (UDENYCA) injection 6 mg, 6 mg, Subcutaneous, Once, 1 of 6 cycles Administration: 6 mg (06/08/2020) CARBOplatin (PARAPLATIN) 500 mg in sodium chloride 0.9 % 250 mL chemo infusion, 500 mg (98.9 % of original dose 502.8 mg), Intravenous,  Once, 1 of 6 cycles Dose modification:   (original dose 502.8 mg, Cycle 1) Administration: 500 mg (06/06/2020) fosaprepitant (EMEND) 150 mg in sodium chloride 0.9 % 145 mL IVPB, 150 mg, Intravenous,  Once, 1 of 6 cycles Administration: 150 mg (06/06/2020) PACLitaxel (TAXOL) 300 mg in sodium chloride 0.9 % 250 mL chemo infusion (> 80mg /m2), 174 mg/m2 = 306 mg, Intravenous,  Once, 1 of 6 cycles Dose modification: 150 mg/m2 (original dose 175 mg/m2, Cycle 2, Reason: Dose not tolerated) Administration: 300 mg  (06/06/2020)  for chemotherapy treatment.      CANCER STAGING: Cancer Staging Right ovarian epithelial cancer [facility]) Staging form: Ovary, Fallopian Tube, and Primary Peritoneal Carcinoma, AJCC 8th Edition - Clinical stage from 05/03/2020: FIGO Stage II, calculated as Stage Unknown (cT2b, cNX, cM0) - Signed by Everitt Amber, MD on 05/24/2020   INTERVAL HISTORY:  April Guerra, a 75 y.o. female, returns for routine follow-up and consideration for next cycle of chemotherapy. April Guerra was last seen by Dr. Benay Pike on 06/14/2020.  Due for cycle #2 of carboplatin, paclitaxel and Aloxi today.   Today she is accompanied by her husband. Overall, she tells me she has been feeling good. She had a robotic-assisted TAH-BSO with Dr. Denman George on 05/03/2020. Her energy levels have improved since starting chemo. She has been taking gabapentin TID for the numbness in her feet and lower shins and reports that it is better; she denies feeling drowsy or having numbness in her hands. She tolerated the previous treatment well and had to take Compazine only once for her nausea. She reports having aching in her legs and hips for 1 day after receiving Udenyca which improved with Tylenol. Her appetite is excellent and her taste is intact.  She saw the geneticist and had her blood drawn for genetic analysis. 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   Overall, she feels ready for next cycle of chemo today.    REVIEW OF SYSTEMS:  Review of Systems  Constitutional: Positive for fatigue (75%). Negative for appetite change.  Gastrointestinal: Negative for nausea.  Neurological: Positive for numbness (feet and lower shins; improving with gabapentin).  All other systems reviewed and are negative.   PAST MEDICAL/SURGICAL HISTORY:  Past Medical History:  Diagnosis Date  . A-fib (HCC)   . Anxiety   . Arthritis   . Asthma    hx of   . Cancer (HCC)    ovarian  . Dyspnea    with exertion   .  H/O seasonal allergies   . Heart murmur    as aa child   . Hypercholesteremia   . Hypertension   . Spinal stenosis    Past Surgical History:  Procedure Laterality Date  . ABDOMINAL HYSTERECTOMY    . BIOPSY  05/08/2020   Procedure: BIOPSY;  Surgeon: Dolores Frame, MD;  Location: [facility];  Service: Gastroenterology;;  . BREAST SURGERY    . CARDIAC CATHETERIZATION    . CHOLECYSTECTOMY    . COLONOSCOPY N/A 08/14/2015   Procedure: COLONOSCOPY;  Surgeon: Franky Macho, MD;  Location: [facility];  Service: Gastroenterology;  Laterality: N/A;  . ESOPHAGOGASTRODUODENOSCOPY (EGD) WITH PROPOFOL N/A 05/08/2020   Procedure: ESOPHAGOGASTRODUODENOSCOPY (EGD) WITH PROPOFOL;  Surgeon: Dolores Frame, MD;  Location: [facility];  Service: Gastroenterology;  Laterality: N/A;  . IR IMAGING GUIDED PORT INSERTION  06/04/2020  . ROBOTIC ASSISTED TOTAL HYSTERECTOMY WITH BILATERAL SALPINGO OOPHERECTOMY N/A 05/03/2020   Procedure: XI ROBOTIC ASSISTED TOTAL HYSTERECTOMY WITH BILATERAL SALPINGO OOPHORECTOMY, OMENTECTOMY,RADICAL TUMOR DEBULKING, RIGID PROSTOSCOPY;  Surgeon: Adolphus Birchwood, MD;  Location: [facility];  Service: Gynecology;  Laterality: N/A;  . THROMBECTOMY FEMORAL ARTERY Right 12/17/2013   Procedure: REPAIR OF RIGHT FEMORAL ARTERY PSEUDOANEURYSM;  Surgeon: Sherren Kerns, MD;  Location: [facility] OR;  Service: Vascular;  Laterality: Right;    SOCIAL HISTORY:  Social History   Socioeconomic History  . Marital status: Married    Spouse name: Not on file  . Number of children: 4  . Years of education: Not on file  . Highest education level: Not on file  Occupational History  . Occupation: retired Engineer, civil (consulting)  Tobacco Use  . Smoking status: Never Smoker  . Smokeless tobacco: Never Used  Vaping Use  . Vaping Use: Never used  Substance and Sexual Activity  . Alcohol use: No  . Drug use: No  . Sexual activity: Not Currently  Other Topics Concern  . Not on file  Social History  Narrative  . Not on file   Social Determinants of Health   Financial Resource Strain: Low Risk   . Difficulty of Paying Living Expenses: Not hard at all  Food Insecurity: No Food Insecurity  . Worried About Programme researcher, broadcasting/film/video in the Last Year: Never true  . Ran Out of Food in the Last Year: Never true  Transportation Needs: No Transportation Needs  . Lack of Transportation (Medical): No  . Lack of Transportation (Non-Medical): No  Physical Activity: Insufficiently Active  . Days of Exercise per Week: 7 days  . Minutes of Exercise per Session: 10 min  Stress: No Stress Concern Present  . Feeling of Stress : Not at all  Social Connections: Moderately Integrated  . Frequency of Communication with Friends and Family: More than three times a week  . Frequency of Social Gatherings with Friends and Family: More than three times a week  . Attends Religious Services: More than 4 times per year  . Active Member of Clubs or Organizations: No  . Attends Banker Meetings: Never  . Marital Status:  Married  Intimate Partner Violence: Not At Risk  . Fear of Current or Ex-Partner: No  . Emotionally Abused: No  . Physically Abused: No  . Sexually Abused: No    FAMILY HISTORY:  Family History  Problem Relation Age of Onset  . Hypertension Mother   . Heart failure Mother   . Asthma Mother   . Ulcers Mother   . Benign prostatic hyperplasia Father   . Ulcers Father   . Esophageal varices Father   . Atrial fibrillation Sister   . Atrial fibrillation Brother   . Heart disease Brother   . Melanoma Maternal Aunt   . Colon cancer Neg Hx     CURRENT MEDICATIONS:  Current Outpatient Medications  Medication Sig Dispense Refill  . acetaminophen (TYLENOL) 500 MG tablet Take 500-1,000 mg by mouth every 6 (six) hours as needed for moderate pain.    Marland Kitchen albuterol (VENTOLIN HFA) 108 (90 Base) MCG/ACT inhaler Inhale 2 puffs into the lungs every 4 (four) hours as needed for wheezing or  shortness of breath. 18 g 2  . amLODipine (NORVASC) 10 MG tablet Take 1 tablet (10 mg total) by mouth daily with lunch. For BP 30 tablet 3  . Calcium Carb-Cholecalciferol (CALCIUM 600/VITAMIN D PO) Take 1 tablet by mouth in the morning and at bedtime.    Marland Kitchen CARBOPLATIN IV Inject into the vein every 21 ( twenty-one) days.    . cetirizine (ZYRTEC) 10 MG tablet Take 10 mg by mouth daily.    . Cholecalciferol (VITAMIN D) 125 MCG (5000 UT) CAPS Take 5,000 Units by mouth daily.    . Coenzyme Q10 (COQ10) 100 MG CAPS Take 100 mg by mouth at bedtime.     . cyclobenzaprine (FLEXERIL) 10 MG tablet Take 5 mg by mouth See admin instructions. Take 5 mg at night, may take a second 5 mg dose during the day as needed for muscle spasms    . dexamethasone (DECADRON) 4 MG tablet Take 1 tablet (4 mg total) by mouth daily. Start the day after carboplatin chemotherapy for 3 days. 20 tablet 0  . diltiazem (CARDIZEM) 30 MG tablet Take 1 tablet every 4 hours AS NEEDED for AFIB heart rate >100 (Patient taking differently: Take 30 mg by mouth every 4 (four) hours as needed (AFIB heart rate > 100).) 30 tablet 3  . fluticasone (FLONASE) 50 MCG/ACT nasal spray Place 1 spray into both nostrils daily.     Marland Kitchen gabapentin (NEURONTIN) 100 MG capsule Take 1 capsule (100 mg total) by mouth 3 (three) times daily. 90 capsule 0  . lidocaine-prilocaine (EMLA) cream Apply to affected area once 30 g 3  . lisinopril (ZESTRIL) 30 MG tablet Take 1 tablet (30 mg total) by mouth in the morning. Okay to restart around Monday, 05/14/2020 if blood pressure stable 30 tablet 3  . Magnesium 100 MG TABS Take 50 mg by mouth at bedtime.    . melatonin 5 MG TABS Take 2.5 mg by mouth at bedtime.     . metoprolol succinate (TOPROL-XL) 100 MG 24 hr tablet Take 100 mg by mouth in the morning. Take with or immediately following a meal.    . Multiple Vitamins-Minerals (CENTRUM SILVER ADULT 50+ PO) Take 1 tablet by mouth daily.    . Omega-3 Fatty Acids (FISH OIL)  1000 MG CAPS Take 1,000 mg by mouth daily.    . ondansetron (ZOFRAN) 4 MG tablet Take 1 tablet (4 mg total) by mouth every 6 (six) hours as needed  for nausea. (Patient taking differently: Take 2 mg by mouth every 6 (six) hours as needed for nausea.) 20 tablet 0  . ondansetron (ZOFRAN) 8 MG tablet Take 1 tablet (8 mg total) by mouth 2 (two) times daily as needed for refractory nausea / vomiting. Start on day 3 after carboplatin chemo. 30 tablet 1  . PACLITAXEL IV Inject into the vein every 21 ( twenty-one) days.    . pantoprazole (PROTONIX) 40 MG tablet Take 1 tablet (40 mg total) by mouth 2 (two) times daily before a meal. 60 tablet 1  . polyethylene glycol powder (GLYCOLAX/MIRALAX) 17 GM/SCOOP powder Take 17 g by mouth daily with lunch.    . prochlorperazine (COMPAZINE) 10 MG tablet Take 1 tablet (10 mg total) by mouth every 6 (six) hours as needed (Nausea or vomiting). 30 tablet 1  . senna (SENOKOT) 8.6 MG tablet Take 1 tablet by mouth 2 (two) times daily.    . simvastatin (ZOCOR) 40 MG tablet Take 0.5 tablets (20 mg total) by mouth daily. (Patient taking differently: Take 40 mg by mouth at bedtime.) 30 tablet 2  . sodium chloride (OCEAN) 0.65 % SOLN nasal spray Place 1 spray into both nostrils See admin instructions. Use 1 spray in each nostril in the morning may use a second dose at night as needed for congestion    . tetrahydrozoline-zinc (VISINE-AC) 0.05-0.25 % ophthalmic solution Place 1 drop into both eyes daily.    Marland Kitchen triamcinolone (KENALOG) 0.1 % Apply 1 application topically daily as needed (irritation).    . vitamin C (ASCORBIC ACID) 500 MG tablet Take 500 mg by mouth 2 (two) times daily.    . Vitamins A & D (VITAMIN A & D) ointment Apply 1 application topically as needed (irritation).     No current facility-administered medications for this visit.    ALLERGIES:  Allergies  Allergen Reactions  . Morphine And Related Nausea And Vomiting  . Zofran [Ondansetron]     Causes minor  constipation, tolerates low doses      PHYSICAL EXAM:  Performance status (ECOG): 1 - Symptomatic but completely ambulatory  Vitals:   06/27/20 0812  BP: (!) 174/76  Pulse: 89  Resp: 18  Temp: (!) 96.2 F (35.7 C)  SpO2: 100%   Wt Readings from Last 3 Encounters:  06/27/20 165 lb 6.4 oz (75 kg)  06/14/20 163 lb 1.6 oz (74 kg)  06/06/20 162 lb 11.2 oz (73.8 kg)   Physical Exam Vitals reviewed.  Constitutional:      Appearance: Normal appearance.  Cardiovascular:     Rate and Rhythm: Normal rate and regular rhythm.     Pulses: Normal pulses.     Heart sounds: Normal heart sounds.  Pulmonary:     Effort: Pulmonary effort is normal.     Breath sounds: Normal breath sounds.  Chest:     Comments: Port-a-Cath in R chest Neurological:     General: No focal deficit present.     Mental Status: She is alert and oriented to person, place, and time.  Psychiatric:        Mood and Affect: Mood normal.        Behavior: Behavior normal.     LABORATORY DATA:  I have reviewed the labs as listed.  CBC Latest Ref Rng & Units 06/27/2020 06/14/2020 06/05/2020  WBC 4.0 - 10.5 K/uL 6.2 29.8(H) 6.9  Hemoglobin 12.0 - 15.0 g/dL 10.7(L) 11.2(L) 11.7(L)  Hematocrit 36.0 - 46.0 % 32.4(L) 34.8(L) 36.0  Platelets  150 - 400 K/uL 369 389 407(H)   CMP Latest Ref Rng & Units 06/27/2020 06/05/2020 05/29/2020  Glucose 70 - 99 mg/dL 91 88 93  BUN 8 - 23 mg/dL 17 16 14   Creatinine 0.44 - 1.00 mg/dL 0.55 0.58 0.52  Sodium 135 - 145 mmol/L 133(L) 129(L) 132(L)  Potassium 3.5 - 5.1 mmol/L 3.8 3.8 4.2  Chloride 98 - 111 mmol/L 100 99 99  CO2 22 - 32 mmol/L 24 21(L) 23  Calcium 8.9 - 10.3 mg/dL 9.1 9.3 9.3  Total Protein 6.5 - 8.1 g/dL 7.1 7.6 7.4  Total Bilirubin 0.3 - 1.2 mg/dL 0.5 0.3 0.5  Alkaline Phos 38 - 126 U/L 64 60 50  AST 15 - 41 U/L 23 28 22   ALT 0 - 44 U/L 23 21 20     DIAGNOSTIC IMAGING:  I have independently reviewed the scans and discussed with the patient. IR IMAGING GUIDED PORT  INSERTION  Result Date: 06/04/2020 INDICATION: 75 year old female referred for port catheter placement EXAM: IMPLANTED PORT A CATH PLACEMENT WITH ULTRASOUND AND FLUOROSCOPIC GUIDANCE MEDICATIONS: 2 g Ancef; The antibiotic was administered within an appropriate time interval prior to skin puncture. ANESTHESIA/SEDATION: Moderate (conscious) sedation was employed during this procedure. A total of Versed 2.0 mg and Fentanyl 75 mcg was administered intravenously. Moderate Sedation Time: 17 minutes. The patient's level of consciousness and vital signs were monitored continuously by radiology nursing throughout the procedure under my direct supervision. FLUOROSCOPY TIME:  0 minutes, 12 seconds (1 mGy) COMPLICATIONS: None PROCEDURE: The procedure, risks, benefits, and alternatives were explained to the patient. Questions regarding the procedure were encouraged and answered. The patient understands and consents to the procedure. Ultrasound survey was performed with images stored and sent to PACs. The right neck and chest was prepped with chlorhexidine, and draped in the usual sterile fashion using maximum barrier technique (cap and mask, sterile gown, sterile gloves, large sterile sheet, hand hygiene and cutaneous antiseptic). Antibiotic prophylaxis was provided with 2.0g Ancef administered IV one hour prior to skin incision. Local anesthesia was attained by infiltration with 1% lidocaine without epinephrine. Ultrasound demonstrated patency of the right internal jugular vein, and this was documented with an image. Under real-time ultrasound guidance, this vein was accessed with a 21 gauge micropuncture needle and image documentation was performed. A small dermatotomy was made at the access site with an 11 scalpel. A 0.018" wire was advanced into the SVC and used to estimate the length of the internal catheter. The access needle exchanged for a 31F micropuncture vascular sheath. The 0.018" wire was then removed and a 0.035"  wire advanced into the IVC. An appropriate location for the subcutaneous reservoir was selected below the clavicle and an incision was made through the skin and underlying soft tissues. The subcutaneous tissues were then dissected using a combination of blunt and sharp surgical technique and a pocket was formed. A single lumen power injectable portacatheter was then tunneled through the subcutaneous tissues from the pocket to the dermatotomy and the port reservoir placed within the subcutaneous pocket. The venous access site was then serially dilated and a peel away vascular sheath placed over the wire. The wire was removed and the port catheter advanced into position under fluoroscopic guidance. The catheter tip is positioned in the cavoatrial junction. This was documented with a spot image. The portacatheter was then tested and found to flush and aspirate well. The port was flushed with saline followed by 100 units/mL heparinized saline. The pocket was then closed  in two layers using first subdermal inverted interrupted absorbable sutures followed by a running subcuticular suture. The epidermis was then sealed with Dermabond. The dermatotomy at the venous access site was also seal with Dermabond. Patient tolerated the procedure well and remained hemodynamically stable throughout. No complications encountered and no significant blood loss encountered IMPRESSION: Status post right IJ port catheter placement. Catheter ready for use. Signed, Dulcy Fanny. Dellia Nims, RPVI [facility] Radiology Electronically Signed   By: Corrie Mckusick D.O.   On: 06/04/2020 12:49     ASSESSMENT:  1.  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 [id number] on 04/20/2020 -Evaluated by Dr. Denman George on 05/24/2020. -6 cycles of carboplatin and paclitaxel was recommended. -Somatic and germline mutation  testing recommended.  She can be a candidate for PARP inhibitor maintenance therapy if any mutations found. -Cycle 1 of carboplatin and paclitaxel started on 06/06/2020.  2.  Atrial fibrillation: -Was on Eliquis. -She had postoperative GI bleed when Eliquis was discontinued.    PLAN:  1.  Stage II clear-cell right ovarian cancer: -She has tolerated first cycle reasonably well. -She reported worsening neuropathy in the feet. -Reviewed labs which showed normal LFTs.  CBC shows hemoglobin 10.7 with normal white count and platelet count. -Proceed with cycle 2 with dose reduction of paclitaxel by 20%.  2.  Peripheral neuropathy: -Continue gabapentin 100 mg 3 times a day.  He reports improvement. -We will cut back on paclitaxel by 20%.   Orders placed this encounter:  No orders of the defined types were placed in this encounter.    Derek Jack, MD [place] [phone]   I, Milinda Antis, am acting as a scribe for Dr. Sanda Linger.  I, Derek Jack MD, have reviewed the above documentation for accuracy and completeness, and I agree with the above.

## 2020-06-27 NOTE — Progress Notes (Signed)
Patient assessed and labs reviewed by Dr. Katragadda. Okay to proceed with treatment. Primary RN and pharmacy aware. 

## 2020-06-27 NOTE — Progress Notes (Signed)
Pt here for D1C2 of carbo/taxol.  Labs WNL for treatment today. Okay for teratment today.  Dose reducing by 20% today.    Tolerated treatment well today.  Discharge via wheelchair in stable condition.  Vital signs stable prior to discharge.

## 2020-06-29 ENCOUNTER — Inpatient Hospital Stay (HOSPITAL_COMMUNITY): Payer: Medicare Other

## 2020-06-29 ENCOUNTER — Other Ambulatory Visit: Payer: Self-pay

## 2020-06-29 VITALS — BP 171/82 | HR 70 | Temp 96.9°F | Resp 18

## 2020-06-29 DIAGNOSIS — R200 Anesthesia of skin: Secondary | ICD-10-CM | POA: Diagnosis not present

## 2020-06-29 DIAGNOSIS — K5900 Constipation, unspecified: Secondary | ICD-10-CM | POA: Diagnosis not present

## 2020-06-29 DIAGNOSIS — Z5111 Encounter for antineoplastic chemotherapy: Secondary | ICD-10-CM | POA: Diagnosis not present

## 2020-06-29 DIAGNOSIS — Z7901 Long term (current) use of anticoagulants: Secondary | ICD-10-CM | POA: Diagnosis not present

## 2020-06-29 DIAGNOSIS — Z8249 Family history of ischemic heart disease and other diseases of the circulatory system: Secondary | ICD-10-CM | POA: Diagnosis not present

## 2020-06-29 DIAGNOSIS — I4891 Unspecified atrial fibrillation: Secondary | ICD-10-CM | POA: Diagnosis not present

## 2020-06-29 DIAGNOSIS — Z79899 Other long term (current) drug therapy: Secondary | ICD-10-CM | POA: Diagnosis not present

## 2020-06-29 DIAGNOSIS — C561 Malignant neoplasm of right ovary: Secondary | ICD-10-CM

## 2020-06-29 DIAGNOSIS — Z836 Family history of other diseases of the respiratory system: Secondary | ICD-10-CM | POA: Diagnosis not present

## 2020-06-29 DIAGNOSIS — Z5189 Encounter for other specified aftercare: Secondary | ICD-10-CM | POA: Diagnosis not present

## 2020-06-29 DIAGNOSIS — G629 Polyneuropathy, unspecified: Secondary | ICD-10-CM | POA: Diagnosis not present

## 2020-06-29 DIAGNOSIS — M7989 Other specified soft tissue disorders: Secondary | ICD-10-CM | POA: Diagnosis not present

## 2020-06-29 DIAGNOSIS — R5383 Other fatigue: Secondary | ICD-10-CM | POA: Diagnosis not present

## 2020-06-29 MED ORDER — PEGFILGRASTIM-CBQV 6 MG/0.6ML ~~LOC~~ SOSY
6.0000 mg | PREFILLED_SYRINGE | Freq: Once | SUBCUTANEOUS | Status: AC
  Administered 2020-06-29: 6 mg via SUBCUTANEOUS
  Filled 2020-06-29: qty 0.6

## 2020-06-29 NOTE — Progress Notes (Signed)
Patient tolerated Udencya injection with no complaints voiced.  Site clean and dry with no bruising or swelling noted.  No complaints of pain.  Discharged with vital signs stable and no signs or symptoms of distress noted.  

## 2020-07-04 ENCOUNTER — Telehealth: Payer: Self-pay | Admitting: Licensed Clinical Social Worker

## 2020-07-04 ENCOUNTER — Encounter (HOSPITAL_COMMUNITY): Payer: Self-pay | Admitting: *Deleted

## 2020-07-04 ENCOUNTER — Other Ambulatory Visit (HOSPITAL_COMMUNITY): Payer: Self-pay | Admitting: *Deleted

## 2020-07-04 MED ORDER — GABAPENTIN 300 MG PO CAPS: 300.0000 mg | ORAL_CAPSULE | Freq: Three times a day (TID) | ORAL | 2 refills | Status: DC

## 2020-07-04 NOTE — Telephone Encounter (Signed)
Left voicemail for April Guerra that the genetic testing lab has not received a blood sample for her yet. We will need to either schedule a lab appointment for her to get her blood drawn or have it added on the next time she is here. I requested a call back to discuss.

## 2020-07-04 NOTE — Progress Notes (Signed)
Patient called and said that the gabapentin prescribed last week helped her initially with the pain in her toes but since her treatment last week she has had increased pain in her toes, hands and fingers.  She is asking if we can increase the dose or try something different.  She is also asking if she can have her teeth cleaned.    Per Dr. Delton Coombes, dose increased to 300 mg three times daily.    I called patient and had to leave a voicemail.  I advised of the increase in dose on her gabapentin.  I have advised that it is okay for her to have dental cleanings.  She was asked to call back for any further questions or concerns.

## 2020-07-10 ENCOUNTER — Other Ambulatory Visit: Payer: Self-pay | Admitting: Licensed Clinical Social Worker

## 2020-07-11 ENCOUNTER — Other Ambulatory Visit (HOSPITAL_COMMUNITY): Payer: Self-pay

## 2020-07-12 ENCOUNTER — Other Ambulatory Visit: Payer: Self-pay

## 2020-07-12 ENCOUNTER — Inpatient Hospital Stay (HOSPITAL_COMMUNITY): Payer: Medicare Other

## 2020-07-18 ENCOUNTER — Inpatient Hospital Stay (HOSPITAL_COMMUNITY): Payer: Medicare Other

## 2020-07-18 ENCOUNTER — Other Ambulatory Visit: Payer: Self-pay

## 2020-07-18 ENCOUNTER — Inpatient Hospital Stay (HOSPITAL_COMMUNITY): Payer: Medicare Other | Admitting: Hematology

## 2020-07-18 VITALS — BP 168/76 | HR 88 | Temp 97.1°F | Resp 18 | Wt 166.2 lb

## 2020-07-18 VITALS — BP 153/87 | HR 73 | Temp 97.3°F | Resp 18

## 2020-07-18 DIAGNOSIS — Z5111 Encounter for antineoplastic chemotherapy: Secondary | ICD-10-CM | POA: Diagnosis not present

## 2020-07-18 DIAGNOSIS — Z5189 Encounter for other specified aftercare: Secondary | ICD-10-CM | POA: Diagnosis not present

## 2020-07-18 DIAGNOSIS — Z8249 Family history of ischemic heart disease and other diseases of the circulatory system: Secondary | ICD-10-CM | POA: Diagnosis not present

## 2020-07-18 DIAGNOSIS — M7989 Other specified soft tissue disorders: Secondary | ICD-10-CM | POA: Diagnosis not present

## 2020-07-18 DIAGNOSIS — C561 Malignant neoplasm of right ovary: Secondary | ICD-10-CM

## 2020-07-18 DIAGNOSIS — G629 Polyneuropathy, unspecified: Secondary | ICD-10-CM | POA: Diagnosis not present

## 2020-07-18 DIAGNOSIS — Z79899 Other long term (current) drug therapy: Secondary | ICD-10-CM | POA: Diagnosis not present

## 2020-07-18 DIAGNOSIS — R5383 Other fatigue: Secondary | ICD-10-CM | POA: Diagnosis not present

## 2020-07-18 DIAGNOSIS — Z836 Family history of other diseases of the respiratory system: Secondary | ICD-10-CM | POA: Diagnosis not present

## 2020-07-18 DIAGNOSIS — R200 Anesthesia of skin: Secondary | ICD-10-CM | POA: Diagnosis not present

## 2020-07-18 DIAGNOSIS — I4891 Unspecified atrial fibrillation: Secondary | ICD-10-CM | POA: Diagnosis not present

## 2020-07-18 DIAGNOSIS — K5900 Constipation, unspecified: Secondary | ICD-10-CM | POA: Diagnosis not present

## 2020-07-18 DIAGNOSIS — Z7901 Long term (current) use of anticoagulants: Secondary | ICD-10-CM | POA: Diagnosis not present

## 2020-07-18 LAB — CBC WITH DIFFERENTIAL/PLATELET
Abs Immature Granulocytes: 0.02 10*3/uL (ref 0.00–0.07)
Basophils Absolute: 0 10*3/uL (ref 0.0–0.1)
Basophils Relative: 0 %
Eosinophils Absolute: 0 10*3/uL (ref 0.0–0.5)
Eosinophils Relative: 1 %
HCT: 34.2 % — ABNORMAL LOW (ref 36.0–46.0)
Hemoglobin: 11.2 g/dL — ABNORMAL LOW (ref 12.0–15.0)
Immature Granulocytes: 0 %
Lymphocytes Relative: 22 %
Lymphs Abs: 1.2 10*3/uL (ref 0.7–4.0)
MCH: 33 pg (ref 26.0–34.0)
MCHC: 32.7 g/dL (ref 30.0–36.0)
MCV: 100.9 fL — ABNORMAL HIGH (ref 80.0–100.0)
Monocytes Absolute: 0.8 10*3/uL (ref 0.1–1.0)
Monocytes Relative: 15 %
Neutro Abs: 3.5 10*3/uL (ref 1.7–7.7)
Neutrophils Relative %: 62 %
Platelets: 415 10*3/uL — ABNORMAL HIGH (ref 150–400)
RBC: 3.39 MIL/uL — ABNORMAL LOW (ref 3.87–5.11)
RDW: 14.6 % (ref 11.5–15.5)
WBC: 5.6 10*3/uL (ref 4.0–10.5)
nRBC: 0 % (ref 0.0–0.2)

## 2020-07-18 LAB — COMPREHENSIVE METABOLIC PANEL
ALT: 20 U/L (ref 0–44)
AST: 21 U/L (ref 15–41)
Albumin: 4.3 g/dL (ref 3.5–5.0)
Alkaline Phosphatase: 63 U/L (ref 38–126)
Anion gap: 7 (ref 5–15)
BUN: 14 mg/dL (ref 8–23)
CO2: 24 mmol/L (ref 22–32)
Calcium: 9.5 mg/dL (ref 8.9–10.3)
Chloride: 101 mmol/L (ref 98–111)
Creatinine, Ser: 0.5 mg/dL (ref 0.44–1.00)
GFR, Estimated: >60 mL/min (ref >60)
Glucose, Bld: 79 mg/dL (ref 70–99)
Potassium: 4.1 mmol/L (ref 3.5–5.1)
Sodium: 132 mmol/L — ABNORMAL LOW (ref 135–145)
Total Bilirubin: 0.4 mg/dL (ref 0.3–1.2)
Total Protein: 7.2 g/dL (ref 6.5–8.1)

## 2020-07-18 LAB — MAGNESIUM: Magnesium: 1.9 mg/dL (ref 1.7–2.4)

## 2020-07-18 MED ORDER — SODIUM CHLORIDE 0.9 % IV SOLN
150.0000 mg | Freq: Once | INTRAVENOUS | Status: AC
  Administered 2020-07-18: 150 mg via INTRAVENOUS
  Filled 2020-07-18: qty 150

## 2020-07-18 MED ORDER — FAMOTIDINE IN NACL 20-0.9 MG/50ML-% IV SOLN
20.0000 mg | Freq: Once | INTRAVENOUS | Status: AC
  Administered 2020-07-18: 20 mg via INTRAVENOUS

## 2020-07-18 MED ORDER — SODIUM CHLORIDE 0.9 % IV SOLN: Freq: Once | INTRAVENOUS | Status: AC

## 2020-07-18 MED ORDER — DIPHENHYDRAMINE HCL 50 MG/ML IJ SOLN
INTRAMUSCULAR | Status: AC
  Filled 2020-07-18: qty 1

## 2020-07-18 MED ORDER — SODIUM CHLORIDE 0.9% FLUSH
10.0000 mL | INTRAVENOUS | Status: DC | PRN
  Administered 2020-07-18: 10 mL

## 2020-07-18 MED ORDER — DIPHENHYDRAMINE HCL 50 MG/ML IJ SOLN
25.0000 mg | Freq: Once | INTRAMUSCULAR | Status: AC
  Administered 2020-07-18: 25 mg via INTRAVENOUS

## 2020-07-18 MED ORDER — PALONOSETRON HCL INJECTION 0.25 MG/5ML
INTRAVENOUS | Status: AC
  Filled 2020-07-18: qty 5

## 2020-07-18 MED ORDER — CARBOPLATIN CHEMO INJECTION 600 MG/60ML
497.4000 mg | Freq: Once | INTRAVENOUS | Status: AC
Start: 2020-07-18 — End: 2020-07-18
  Administered 2020-07-18: 500 mg via INTRAVENOUS
  Filled 2020-07-18: qty 50

## 2020-07-18 MED ORDER — HEPARIN SOD (PORK) LOCK FLUSH 100 UNIT/ML IV SOLN
500.0000 [IU] | Freq: Once | INTRAVENOUS | Status: AC | PRN
  Administered 2020-07-18: 500 [IU]

## 2020-07-18 MED ORDER — SODIUM CHLORIDE 0.9 % IV SOLN
10.0000 mg | Freq: Once | INTRAVENOUS | Status: AC
  Administered 2020-07-18: 10 mg via INTRAVENOUS
  Filled 2020-07-18: qty 10

## 2020-07-18 MED ORDER — PALONOSETRON HCL INJECTION 0.25 MG/5ML
0.2500 mg | Freq: Once | INTRAVENOUS | Status: AC
  Administered 2020-07-18: 0.25 mg via INTRAVENOUS

## 2020-07-18 MED ORDER — FAMOTIDINE IN NACL 20-0.9 MG/50ML-% IV SOLN
INTRAVENOUS | Status: AC
  Filled 2020-07-18: qty 50

## 2020-07-18 MED ORDER — SODIUM CHLORIDE 0.9 % IV SOLN
140.0000 mg/m2 | Freq: Once | INTRAVENOUS | Status: AC
  Administered 2020-07-18: 246 mg via INTRAVENOUS
  Filled 2020-07-18: qty 41

## 2020-07-18 NOTE — Patient Instructions (Addendum)
[facility] at [facility] Discharge Instructions  You were seen today by Dr. Katragadda. He went over your recent results. You received your treatment today. Dr. Katragadda will see you back in 3 weeks for labs and follow up.   Thank you for choosing [facility] at [facility] to provide your oncology and hematology care.  To afford each patient quality time with our provider, please arrive at least 15 minutes before your scheduled appointment time.   If you have a lab appointment with the Cancer Center please come in thru the Main Entrance and check in at the main information desk  You need to re-schedule your appointment should you arrive 10 or more minutes late.  We strive to give you quality time with our providers, and arriving late affects you and other patients whose appointments are after yours.  Also, if you no show three or more times for appointments you may be dismissed from the clinic at the providers discretion.     Again, thank you for choosing [facility].  Our hope is that these requests will decrease the amount of time that you wait before being seen by our physicians.       _____________________________________________________________  Should you have questions after your visit to [facility], please contact our office at [phone] between the hours of 8:00 a.m. and 4:30 p.m.  Voicemails left after 4:00 p.m. will not be returned until the following business day.  For prescription refill requests, have your pharmacy contact our office and allow 72 hours.    Cancer Center Support Programs:   > Cancer Support Group  2nd Tuesday of the month 1pm-2pm, Journey Room    

## 2020-07-18 NOTE — Progress Notes (Signed)
[facility] [address] [place], [place] [address]   CLINIC:  Medical Oncology/Hematology  PCP:  Selinda FlavinHoward, Kevin, MD [address] / [place] [id number] [phone]   REASON FOR VISIT:  Follow-up for right ovarian cancer  PRIOR THERAPY: TAH-BSO on 05/03/2020  NGS Results: Not done  CURRENT THERAPY: Carboplatin, paclitaxel & Aloxi every 3 weeks  BRIEF ONCOLOGIC HISTORY:  Oncology History  Right ovarian epithelial cancer (HCC)  05/03/2020 Initial Diagnosis   Right ovarian epithelial cancer (HCC)   05/03/2020 Cancer Staging   Staging form: Ovary, Fallopian Tube, and Primary Peritoneal Carcinoma, AJCC 8th Edition - Clinical stage from 05/03/2020: FIGO Stage II, calculated as Stage Unknown (cT2b, cNX, cM0) - Signed by Adolphus Birchwoodossi, Emma, MD on 05/24/2020   06/06/2020 -  Chemotherapy    Patient is on Treatment Plan: OVARIAN CARBOPLATIN (AUC 6) / PACLITAXEL (175) Q21D X 6 CYCLES        CANCER STAGING: Cancer Staging Right ovarian epithelial cancer (HCC) Staging form: Ovary, Fallopian Tube, and Primary Peritoneal Carcinoma, AJCC 8th Edition - Clinical stage from 05/03/2020: FIGO Stage II, calculated as Stage Unknown (cT2b, cNX, cM0) - Signed by Adolphus Birchwoodossi, Emma, MD on 05/24/2020   INTERVAL HISTORY:  [name], a 75 y.o. female, returns for routine follow-up and consideration for next cycle of chemotherapy. Bonita QuinLinda was last seen on 06/27/2020.  Due for cycle #3 of carboplatin, paclitaxel and Aloxi today.   Today she is accompanied by her daughter. Overall, she tells me she has been feeling pretty well. She tolerated the previous treatment well, though she notes that she felt fatigued for 1 week following treatment. She also reports having constipation and takes Miralax for the 1 week. She has numbness and tingling in her toes which worsens when she stands for a prolonged period; she is taking gabapentin 300 mg TID and does not feel drowsy which has also helped her back  pain. Her appetite is excellent. She stopped taking Eliquis due to her bleeding ulcer.  She spoke to genetics and had her blood drawn already; she is waiting on the results. She will have an EGD on 2/15 with Dr. Levon Hedgerastaneda.  Overall, she feels ready for next cycle of chemo today.    REVIEW OF SYSTEMS:  Review of Systems  Constitutional: Positive for fatigue (90%). Negative for appetite change.  Cardiovascular: Positive for leg swelling.  Gastrointestinal: Positive for constipation (x 1 week following Tx).  Neurological: Positive for numbness (& tingling in toes).  All other systems reviewed and are negative.   PAST MEDICAL/SURGICAL HISTORY:  Past Medical History:  Diagnosis Date  . A-fib (HCC)   . Anxiety   . Arthritis   . Asthma    hx of   . Cancer (HCC)    ovarian  . Dyspnea    with exertion   . H/O seasonal allergies   . Heart murmur    as aa child   . Hypercholesteremia   . Hypertension   . Spinal stenosis    Past Surgical History:  Procedure Laterality Date  . ABDOMINAL HYSTERECTOMY    . BIOPSY  05/08/2020   Procedure: BIOPSY;  Surgeon: Dolores Frameastaneda Mayorga, Daniel, MD;  [facility];  Service: Gastroenterology;;  . BREAST SURGERY    . CARDIAC CATHETERIZATION    . CHOLECYSTECTOMY    . COLONOSCOPY N/A 08/14/2015   Procedure: COLONOSCOPY;  Surgeon: Franky MachoMark Jenkins, MD;  [facility]: [facility];  Service: Gastroenterology;  Laterality: N/A;  .  ESOPHAGOGASTRODUODENOSCOPY (EGD) WITH PROPOFOL N/A 05/08/2020   Procedure: ESOPHAGOGASTRODUODENOSCOPY (EGD) WITH PROPOFOL;  Surgeon: Harvel Quale, MD;  [facility];  Service: Gastroenterology;  Laterality: N/A;  . IR IMAGING GUIDED PORT INSERTION  06/04/2020  . ROBOTIC ASSISTED TOTAL HYSTERECTOMY WITH BILATERAL SALPINGO OOPHERECTOMY N/A 05/03/2020   Procedure: XI ROBOTIC ASSISTED TOTAL HYSTERECTOMY WITH BILATERAL SALPINGO OOPHORECTOMY, OMENTECTOMY,RADICAL TUMOR DEBULKING, RIGID PROSTOSCOPY;   Surgeon: Everitt Amber, MD;  Location: [facility];  Service: Gynecology;  Laterality: N/A;  . THROMBECTOMY FEMORAL ARTERY Right 12/17/2013   Procedure: REPAIR OF RIGHT FEMORAL ARTERY PSEUDOANEURYSM;  Surgeon: Elam Dutch, MD;  Location: [facility];  Service: Vascular;  Laterality: Right;    SOCIAL HISTORY:  Social History   Socioeconomic History  . Marital status: Married    Spouse name: Not on file  . Number of children: 4  . Years of education: Not on file  . Highest education level: Not on file  Occupational History  . Occupation: retired Marine scientist  Tobacco Use  . Smoking status: Never Smoker  . Smokeless tobacco: Never Used  Vaping Use  . Vaping Use: Never used  Substance and Sexual Activity  . Alcohol use: No  . Drug use: No  . Sexual activity: Not Currently  Other Topics Concern  . Not on file  Social History Narrative  . Not on file   Social Determinants of Health   Financial Resource Strain: Low Risk   . Difficulty of Paying Living Expenses: Not hard at all  Food Insecurity: No Food Insecurity  . Worried About Charity fundraiser in the Last Year: Never true  . Ran Out of Food in the Last Year: Never true  Transportation Needs: No Transportation Needs  . Lack of Transportation (Medical): No  . Lack of Transportation (Non-Medical): No  Physical Activity: Insufficiently Active  . Days of Exercise per Week: 7 days  . Minutes of Exercise per Session: 10 min  Stress: No Stress Concern Present  . Feeling of Stress : Not at all  Social Connections: Moderately Integrated  . Frequency of Communication with Friends and Family: More than three times a week  . Frequency of Social Gatherings with Friends and Family: More than three times a week  . Attends Religious Services: More than 4 times per year  . Active Member of Clubs or Organizations: No  . Attends Archivist Meetings: Never  . Marital Status: Married  Human resources officer Violence: Not At Risk  . Fear of Current  or Ex-Partner: No  . Emotionally Abused: No  . Physically Abused: No  . Sexually Abused: No    FAMILY HISTORY:  Family History  Problem Relation Age of Onset  . Hypertension Mother   . Heart failure Mother   . Asthma Mother   . Ulcers Mother   . Benign prostatic hyperplasia Father   . Ulcers Father   . Esophageal varices Father   . Atrial fibrillation Sister   . Atrial fibrillation Brother   . Heart disease Brother   . Melanoma Maternal Aunt   . Colon cancer Neg Hx     CURRENT MEDICATIONS:  Current Outpatient Medications  Medication Sig Dispense Refill  . acetaminophen (TYLENOL) 500 MG tablet Take 500-1,000 mg by mouth every 6 (six) hours as needed for moderate pain.    Marland Kitchen albuterol (VENTOLIN HFA) 108 (90 Base) MCG/ACT inhaler Inhale 2 puffs into the lungs every 4 (four) hours as needed for wheezing or shortness of breath. 18 g  2  . amLODipine (NORVASC) 10 MG tablet Take 1 tablet (10 mg total) by mouth daily with lunch. For BP 30 tablet 3  . Calcium Carb-Cholecalciferol (CALCIUM 600/VITAMIN D PO) Take 1 tablet by mouth in the morning and at bedtime.    Marland Kitchen CARBOPLATIN IV Inject into the vein every 21 ( twenty-one) days.    . cetirizine (ZYRTEC) 10 MG tablet Take 10 mg by mouth daily.    . Cholecalciferol (VITAMIN D) 125 MCG (5000 UT) CAPS Take 5,000 Units by mouth daily.    . Coenzyme Q10 (COQ10) 100 MG CAPS Take 100 mg by mouth at bedtime.     . cyclobenzaprine (FLEXERIL) 10 MG tablet Take 5 mg by mouth See admin instructions. Take 5 mg at night, may take a second 5 mg dose during the day as needed for muscle spasms    . dexamethasone (DECADRON) 4 MG tablet Take 1 tablet (4 mg total) by mouth daily. Start the day after carboplatin chemotherapy for 3 days. 20 tablet 0  . diltiazem (CARDIZEM) 30 MG tablet Take 1 tablet every 4 hours AS NEEDED for AFIB heart rate >100 (Patient taking differently: Take 30 mg by mouth every 4 (four) hours as needed (AFIB heart rate > 100).) 30 tablet 3   . fluticasone (FLONASE) 50 MCG/ACT nasal spray Place 1 spray into both nostrils daily.     Marland Kitchen gabapentin (NEURONTIN) 300 MG capsule Take 1 capsule (300 mg total) by mouth 3 (three) times daily. 90 capsule 2  . lidocaine-prilocaine (EMLA) cream Apply to affected area once 30 g 3  . lisinopril (ZESTRIL) 30 MG tablet Take 1 tablet (30 mg total) by mouth in the morning. Okay to restart around Monday, 05/14/2020 if blood pressure stable 30 tablet 3  . Magnesium 100 MG TABS Take 50 mg by mouth at bedtime.    . melatonin 5 MG TABS Take 2.5 mg by mouth at bedtime.     . metoprolol succinate (TOPROL-XL) 100 MG 24 hr tablet Take 100 mg by mouth in the morning. Take with or immediately following a meal.    . Multiple Vitamins-Minerals (CENTRUM SILVER ADULT 50+ PO) Take 1 tablet by mouth daily.    . Omega-3 Fatty Acids (FISH OIL) 1000 MG CAPS Take 1,000 mg by mouth daily.    . ondansetron (ZOFRAN) 4 MG tablet Take 1 tablet (4 mg total) by mouth every 6 (six) hours as needed for nausea. (Patient taking differently: Take 2 mg by mouth every 6 (six) hours as needed for nausea.) 20 tablet 0  . ondansetron (ZOFRAN) 8 MG tablet Take 1 tablet (8 mg total) by mouth 2 (two) times daily as needed for refractory nausea / vomiting. Start on day 3 after carboplatin chemo. (Patient not taking: Reported on 06/27/2020) 30 tablet 1  . PACLITAXEL IV Inject into the vein every 21 ( twenty-one) days.    . pantoprazole (PROTONIX) 40 MG tablet Take 1 tablet (40 mg total) by mouth 2 (two) times daily before a meal. 60 tablet 1  . polyethylene glycol powder (GLYCOLAX/MIRALAX) 17 GM/SCOOP powder Take 17 g by mouth daily with lunch.    . prochlorperazine (COMPAZINE) 10 MG tablet Take 1 tablet (10 mg total) by mouth every 6 (six) hours as needed (Nausea or vomiting). 30 tablet 1  . senna (SENOKOT) 8.6 MG tablet Take 1 tablet by mouth 2 (two) times daily. (Patient not taking: Reported on 06/27/2020)    . simvastatin (ZOCOR) 40 MG tablet Take  0.5  tablets (20 mg total) by mouth daily. (Patient taking differently: Take 40 mg by mouth at bedtime.) 30 tablet 2  . sodium chloride (OCEAN) 0.65 % SOLN nasal spray Place 1 spray into both nostrils See admin instructions. Use 1 spray in each nostril in the morning may use a second dose at night as needed for congestion    . tetrahydrozoline-zinc (VISINE-AC) 0.05-0.25 % ophthalmic solution Place 1 drop into both eyes daily.    Marland Kitchen triamcinolone (KENALOG) 0.1 % Apply 1 application topically daily as needed (irritation).    . vitamin C (ASCORBIC ACID) 500 MG tablet Take 500 mg by mouth daily.    . Vitamins A & D (VITAMIN A & D) ointment Apply 1 application topically as needed (irritation).     No current facility-administered medications for this visit.    ALLERGIES:  Allergies  Allergen Reactions  . Morphine And Related Nausea And Vomiting  . Zofran [Ondansetron]     Causes minor constipation, tolerates low doses      PHYSICAL EXAM:  Performance status (ECOG): 1 - Symptomatic but completely ambulatory  Vitals:   07/18/20 0839  BP: (!) 168/76  Pulse: 88  Resp: 18  Temp: (!) 97.1 F (36.2 C)  SpO2: 100%   Wt Readings from Last 3 Encounters:  07/18/20 166 lb 3.2 oz (75.4 kg)  06/27/20 165 lb (74.8 kg)  06/27/20 165 lb 6.4 oz (75 kg)   Physical Exam Vitals reviewed.  Constitutional:      Appearance: Normal appearance. She is obese.  Cardiovascular:     Rate and Rhythm: Normal rate and regular rhythm.     Pulses: Normal pulses.     Heart sounds: Normal heart sounds.  Pulmonary:     Effort: Pulmonary effort is normal.     Breath sounds: Normal breath sounds.  Chest:     Comments: Port-a-Cath in R chest Abdominal:     Palpations: Abdomen is soft. There is no hepatomegaly, splenomegaly or mass.     Tenderness: There is no abdominal tenderness.     Hernia: No hernia is present.  Musculoskeletal:     Right lower leg: No edema.     Left lower leg: No edema.  Neurological:      General: No focal deficit present.     Mental Status: She is alert and oriented to person, place, and time.  Psychiatric:        Mood and Affect: Mood normal.        Behavior: Behavior normal.     LABORATORY DATA:  I have reviewed the labs as listed.  CBC Latest Ref Rng & Units 07/18/2020 06/27/2020 06/14/2020  WBC 4.0 - 10.5 K/uL 5.6 6.2 29.8(H)  Hemoglobin 12.0 - 15.0 g/dL 11.2(L) 10.7(L) 11.2(L)  Hematocrit 36.0 - 46.0 % 34.2(L) 32.4(L) 34.8(L)  Platelets 150 - 400 K/uL 415(H) 369 389   CMP Latest Ref Rng & Units 07/18/2020 06/27/2020 06/05/2020  Glucose 70 - 99 mg/dL 79 91 88  BUN 8 - 23 mg/dL 14 17 16   Creatinine 0.44 - 1.00 mg/dL 0.50 0.55 0.58  Sodium 135 - 145 mmol/L 132(L) 133(L) 129(L)  Potassium 3.5 - 5.1 mmol/L 4.1 3.8 3.8  Chloride 98 - 111 mmol/L 101 100 99  CO2 22 - 32 mmol/L 24 24 21(L)  Calcium 8.9 - 10.3 mg/dL 9.5 9.1 9.3  Total Protein 6.5 - 8.1 g/dL 7.2 7.1 7.6  Total Bilirubin 0.3 - 1.2 mg/dL 0.4 0.5 0.3  Alkaline Phos 38 - 126  U/L 63 64 60  AST 15 - 41 U/L 21 23 28   ALT 0 - 44 U/L 20 23 21     DIAGNOSTIC IMAGING:  I have independently reviewed the scans and discussed with the patient. No results found.   ASSESSMENT:  1.  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 -[id number] on 04/20/2020 -Evaluated by Dr. Denman George on 05/24/2020. -6 cycles of carboplatin and paclitaxel was recommended. -Somatic and germline mutation testing recommended.  She can be a candidate for PARP inhibitor maintenance therapy if any mutations found. -Cycle 1 of carboplatin and paclitaxel started on 06/06/2020.  2.  Atrial fibrillation: -Was on Eliquis. -She had postoperative GI bleed when Eliquis was discontinued.   PLAN:  1.  Stage II clear-cell right ovarian cancer: -She has tolerated cycle 2 reasonably well. -LFTs today were normal.  White count and platelet count was adequate to  proceed with cycle 3 today.  We will continue with decreased dose paclitaxel at 140 mg per M square. -RTC 3 weeks for follow-up for cycle 3.  2.  Peripheral neuropathy: -Numbness in the toes which is worse on standing. -Continue gabapentin 300 mg 3 times a day.  3.  Genetic testing: -She already talked to her geneticist and the blood was drawn.  We are awaiting results.   Orders placed this encounter:  No orders of the defined types were placed in this encounter.    Derek Jack, MD [place] [phone]   I, Milinda Antis, am acting as a scribe for Dr. Sanda Linger.  I, Derek Jack MD, have reviewed the above documentation for accuracy and completeness, and I agree with the above.

## 2020-07-18 NOTE — Progress Notes (Signed)
Patient presents today for Taxol/Carbo infusion.  Vital signs and labs within parameters for treatment.  No new complaints since last visit.  Treatment given today per MD orders.  Tolerated infusion without adverse affects.  Vital signs stable.  No complaints at this time.  Discharge from clinic via wheelcair in stable condition.  Alert and oriented X 3.  Follow up with [facility] as scheduled.

## 2020-07-18 NOTE — Progress Notes (Signed)
Patient was assessed by Dr. Katragadda and labs have been reviewed.  Patient is okay to proceed with treatment today. Primary RN and pharmacy aware.   

## 2020-07-18 NOTE — Patient Instructions (Signed)
[facility] Discharge Instructions  Labs drawn from portacath today   Thank you for choosing [facility] to provide your oncology and hematology care.  To afford each patient quality time with our provider, please arrive at least 15 minutes before your scheduled appointment time.   If you have a lab appointment with the Cancer Center please come in thru the Main Entrance and check in at the main information desk.  You need to re-schedule your appointment should you arrive 10 or more minutes late.  We strive to give you quality time with our providers, and arriving late affects you and other patients whose appointments are after yours.  Also, if you no show three or more times for appointments you may be dismissed from the clinic at the providers discretion.     Again, thank you for choosing [facility].  Our hope is that these requests will decrease the amount of time that you wait before being seen by our physicians.       _____________________________________________________________  Should you have questions after your visit to [facility], please contact our office at [phone] and follow the prompts.  Our office hours are 8:00 a.m. and 4:30 p.m. Monday - Friday.  Please note that voicemails left after 4:00 p.m. may not be returned until the following business day.  We are closed weekends and major holidays.  You do have access to a nurse 24-7, just call the main number to the clinic [phone] and do not press any options, hold on the line and a nurse will answer the phone.    For prescription refill requests, have your pharmacy contact our office and allow 72 hours.    Due to Covid, you will need to wear a mask upon entering the hospital. If you do not have a mask, a mask will be given to you at the Main Entrance upon arrival. For doctor visits, patients may have 1 support person age 18  or older with them. For treatment visits, patients can not have anyone with them due to social distancing guidelines and our immunocompromised population.     

## 2020-07-18 NOTE — Patient Instructions (Signed)
[facility] Discharge Instructions for Patients Receiving Chemotherapy  Today you received the following chemotherapy agents   To help prevent nausea and vomiting after your treatment, we encourage you to take your nausea medication   If you develop nausea and vomiting that is not controlled by your nausea medication, call the clinic.   BELOW ARE SYMPTOMS THAT SHOULD BE REPORTED IMMEDIATELY:  *FEVER GREATER THAN 100.5 F  *CHILLS WITH OR WITHOUT FEVER  NAUSEA AND VOMITING THAT IS NOT CONTROLLED WITH YOUR NAUSEA MEDICATION  *UNUSUAL SHORTNESS OF BREATH  *UNUSUAL BRUISING OR BLEEDING  TENDERNESS IN MOUTH AND THROAT WITH OR WITHOUT PRESENCE OF ULCERS  *URINARY PROBLEMS  *BOWEL PROBLEMS  UNUSUAL RASH Items with * indicate a potential emergency and should be followed up as soon as possible.  Feel free to call the clinic should you have any questions or concerns. The clinic phone number is [phone].  Please show the CHEMO ALERT CARD at check-in to the Emergency Department and triage nurse.   

## 2020-07-20 ENCOUNTER — Other Ambulatory Visit: Payer: Self-pay

## 2020-07-20 ENCOUNTER — Inpatient Hospital Stay (HOSPITAL_COMMUNITY): Payer: Medicare Other

## 2020-07-20 VITALS — BP 172/76 | HR 87 | Temp 97.2°F | Resp 18

## 2020-07-20 DIAGNOSIS — R5383 Other fatigue: Secondary | ICD-10-CM | POA: Diagnosis not present

## 2020-07-20 DIAGNOSIS — I4891 Unspecified atrial fibrillation: Secondary | ICD-10-CM | POA: Diagnosis not present

## 2020-07-20 DIAGNOSIS — Z7901 Long term (current) use of anticoagulants: Secondary | ICD-10-CM | POA: Diagnosis not present

## 2020-07-20 DIAGNOSIS — K5900 Constipation, unspecified: Secondary | ICD-10-CM | POA: Diagnosis not present

## 2020-07-20 DIAGNOSIS — R200 Anesthesia of skin: Secondary | ICD-10-CM | POA: Diagnosis not present

## 2020-07-20 DIAGNOSIS — Z79899 Other long term (current) drug therapy: Secondary | ICD-10-CM | POA: Diagnosis not present

## 2020-07-20 DIAGNOSIS — M7989 Other specified soft tissue disorders: Secondary | ICD-10-CM | POA: Diagnosis not present

## 2020-07-20 DIAGNOSIS — C561 Malignant neoplasm of right ovary: Secondary | ICD-10-CM

## 2020-07-20 DIAGNOSIS — Z5111 Encounter for antineoplastic chemotherapy: Secondary | ICD-10-CM | POA: Diagnosis not present

## 2020-07-20 DIAGNOSIS — Z5189 Encounter for other specified aftercare: Secondary | ICD-10-CM | POA: Diagnosis not present

## 2020-07-20 DIAGNOSIS — G629 Polyneuropathy, unspecified: Secondary | ICD-10-CM | POA: Diagnosis not present

## 2020-07-20 DIAGNOSIS — Z8249 Family history of ischemic heart disease and other diseases of the circulatory system: Secondary | ICD-10-CM | POA: Diagnosis not present

## 2020-07-20 DIAGNOSIS — Z836 Family history of other diseases of the respiratory system: Secondary | ICD-10-CM | POA: Diagnosis not present

## 2020-07-20 MED ORDER — PEGFILGRASTIM-CBQV 6 MG/0.6ML ~~LOC~~ SOSY
6.0000 mg | PREFILLED_SYRINGE | Freq: Once | SUBCUTANEOUS | Status: AC
  Administered 2020-07-20: 6 mg via SUBCUTANEOUS

## 2020-07-20 NOTE — Progress Notes (Signed)
April Guerra presents today for injection per the provider's orders.  Udenyca 6 mg administration without incident; injection site WNL; see MAR for injection details.  Patient tolerated procedure well and without incident.  No questions or complaints noted at this time.

## 2020-07-21 DIAGNOSIS — I4891 Unspecified atrial fibrillation: Secondary | ICD-10-CM | POA: Diagnosis not present

## 2020-07-21 DIAGNOSIS — I10 Essential (primary) hypertension: Secondary | ICD-10-CM | POA: Diagnosis not present

## 2020-07-21 DIAGNOSIS — M1810 Unilateral primary osteoarthritis of first carpometacarpal joint, unspecified hand: Secondary | ICD-10-CM | POA: Diagnosis not present

## 2020-08-06 ENCOUNTER — Other Ambulatory Visit: Payer: Self-pay

## 2020-08-06 ENCOUNTER — Other Ambulatory Visit (HOSPITAL_COMMUNITY)
Admission: RE | Admit: 2020-08-06 | Discharge: 2020-08-06 | Disposition: A | Discharge status: Home / Self Care | Payer: Medicare Other | Source: Ambulatory Visit | Attending: Gastroenterology | Admitting: Gastroenterology

## 2020-08-06 ENCOUNTER — Other Ambulatory Visit (INDEPENDENT_AMBULATORY_CARE_PROVIDER_SITE_OTHER): Payer: Self-pay

## 2020-08-06 DIAGNOSIS — Z01812 Encounter for preprocedural laboratory examination: Secondary | ICD-10-CM | POA: Diagnosis present

## 2020-08-06 DIAGNOSIS — Z20822 Contact with and (suspected) exposure to covid-19: Secondary | ICD-10-CM | POA: Diagnosis not present

## 2020-08-06 LAB — SARS CORONAVIRUS 2 (TAT 6-24 HRS): SARS Coronavirus 2: NEGATIVE

## 2020-08-07 ENCOUNTER — Ambulatory Visit (HOSPITAL_COMMUNITY): Payer: Medicare Other | Admitting: Anesthesiology

## 2020-08-07 ENCOUNTER — Ambulatory Visit (HOSPITAL_COMMUNITY)
Admission: RE | Admit: 2020-08-07 | Discharge: 2020-08-07 | Disposition: A | Discharge status: Home / Self Care | Payer: Medicare Other | Attending: Gastroenterology | Admitting: Gastroenterology

## 2020-08-07 ENCOUNTER — Other Ambulatory Visit: Payer: Self-pay

## 2020-08-07 ENCOUNTER — Encounter (HOSPITAL_COMMUNITY): Payer: Self-pay | Admitting: Gastroenterology

## 2020-08-07 ENCOUNTER — Encounter (HOSPITAL_COMMUNITY)
Admission: RE | Disposition: A | Discharge status: Home / Self Care | Payer: Self-pay | Source: Home / Self Care | Attending: Gastroenterology

## 2020-08-07 DIAGNOSIS — M1990 Unspecified osteoarthritis, unspecified site: Secondary | ICD-10-CM | POA: Insufficient documentation

## 2020-08-07 DIAGNOSIS — K449 Diaphragmatic hernia without obstruction or gangrene: Secondary | ICD-10-CM | POA: Diagnosis not present

## 2020-08-07 DIAGNOSIS — Z09 Encounter for follow-up examination after completed treatment for conditions other than malignant neoplasm: Secondary | ICD-10-CM

## 2020-08-07 DIAGNOSIS — Z79899 Other long term (current) drug therapy: Secondary | ICD-10-CM | POA: Diagnosis not present

## 2020-08-07 DIAGNOSIS — Z888 Allergy status to other drugs, medicaments and biological substances status: Secondary | ICD-10-CM | POA: Insufficient documentation

## 2020-08-07 DIAGNOSIS — I4891 Unspecified atrial fibrillation: Secondary | ICD-10-CM | POA: Diagnosis not present

## 2020-08-07 DIAGNOSIS — E785 Hyperlipidemia, unspecified: Secondary | ICD-10-CM | POA: Insufficient documentation

## 2020-08-07 DIAGNOSIS — K279 Peptic ulcer, site unspecified, unspecified as acute or chronic, without hemorrhage or perforation: Secondary | ICD-10-CM | POA: Diagnosis not present

## 2020-08-07 DIAGNOSIS — J45909 Unspecified asthma, uncomplicated: Secondary | ICD-10-CM | POA: Diagnosis not present

## 2020-08-07 DIAGNOSIS — Z8711 Personal history of peptic ulcer disease: Secondary | ICD-10-CM | POA: Diagnosis present

## 2020-08-07 DIAGNOSIS — I10 Essential (primary) hypertension: Secondary | ICD-10-CM | POA: Diagnosis not present

## 2020-08-07 DIAGNOSIS — K922 Gastrointestinal hemorrhage, unspecified: Secondary | ICD-10-CM

## 2020-08-07 DIAGNOSIS — Z885 Allergy status to narcotic agent status: Secondary | ICD-10-CM | POA: Insufficient documentation

## 2020-08-07 HISTORY — PX: ESOPHAGOGASTRODUODENOSCOPY (EGD) WITH PROPOFOL: SHX5813

## 2020-08-07 SURGERY — ESOPHAGOGASTRODUODENOSCOPY (EGD) WITH PROPOFOL
Anesthesia: General

## 2020-08-07 MED ORDER — LACTATED RINGERS IV SOLN: INTRAVENOUS | Status: DC

## 2020-08-07 MED ORDER — STERILE WATER FOR IRRIGATION IR SOLN
Status: DC | PRN
  Administered 2020-08-07: 100 mL

## 2020-08-07 MED ORDER — LIDOCAINE VISCOUS HCL 2 % MT SOLN
15.0000 mL | Freq: Once | OROMUCOSAL | Status: AC
  Administered 2020-08-07: 15 mL via OROMUCOSAL

## 2020-08-07 MED ORDER — PROPOFOL 10 MG/ML IV BOLUS
INTRAVENOUS | Status: DC | PRN
  Administered 2020-08-07: 50 mg via INTRAVENOUS

## 2020-08-07 MED ORDER — PROPOFOL 500 MG/50ML IV EMUL
INTRAVENOUS | Status: DC | PRN
  Administered 2020-08-07: 150 ug/kg/min via INTRAVENOUS

## 2020-08-07 MED ORDER — LIDOCAINE VISCOUS HCL 2 % MT SOLN
OROMUCOSAL | Status: AC
  Filled 2020-08-07: qty 15

## 2020-08-07 MED ORDER — PANTOPRAZOLE SODIUM 40 MG PO TBEC: 40.0000 mg | DELAYED_RELEASE_TABLET | Freq: Every day | ORAL | 3 refills | Status: DC

## 2020-08-07 NOTE — Anesthesia Preprocedure Evaluation (Signed)
Anesthesia Evaluation  Patient identified by MRN, date of birth, ID band Patient awake    Reviewed: Allergy & Precautions, NPO status , Patient's Chart, lab work & pertinent test results  History of Anesthesia Complications Negative for: history of anesthetic complications  Airway Mallampati: II  TM Distance: >3 FB Neck ROM: Full    Dental no notable dental hx. (+) Dental Advisory Given, Teeth Intact Crowns :   Pulmonary shortness of breath and with exertion, asthma ,    Pulmonary exam normal breath sounds clear to auscultation       Cardiovascular Exercise Tolerance: Good hypertension, Pt. on medications + Peripheral Vascular Disease  Normal cardiovascular exam+ dysrhythmias Atrial Fibrillation + Valvular Problems/Murmurs  Rhythm:Regular Rate:Normal - Systolic murmurs, - Diastolic murmurs, - Friction Rub, - Carotid Bruit, - Peripheral Edema and - Systolic Click Echo - 0321 -LVEF 60-65%, moderate LVH, normal wall motion, normal diastolic  function, mild MR, mild LAE, upper normal RA size, normal IVC.   08-May-2020 07:07:36 [facility] ROUTINE RECORD Sinus rhythm Lateral infarct, acute >>> Acute MI <<< Confirmed by Milton Ferguson [phone]) on 05/08/2020 7:27:29 AM   Neuro/Psych Anxiety negative neurological ROS     GI/Hepatic Neg liver ROS, PUD, Upper GI bleeding?   Endo/Other  negative endocrine ROS  Renal/GU negative Renal ROS  negative genitourinary   Musculoskeletal  (+) Arthritis ,   Abdominal   Peds negative pediatric ROS (+)  Hematology negative hematology ROS (+) anemia ,   Anesthesia Other Findings Pseudoaneurysm of femoral artery (HCC) Pseudoaneurysm (HCC) Pelvic mass Postmenopausal bleeding Thickened endometrium Chronic anticoagulation Elevated CA-125 Right ovarian epithelial cancer (HCC) Acute upper GI bleed Melena Hematemesis Hypertension Hypercholesteremia A-fib  (HCC)    Reproductive/Obstetrics negative OB ROS                             Anesthesia Physical  Anesthesia Plan  ASA: III and emergent  Anesthesia Plan: General   Post-op Pain Management:    Induction: Intravenous  PONV Risk Score and Plan: TIVA  Airway Management Planned: Nasal Cannula and Natural Airway  Additional Equipment:   Intra-op Plan:   Post-operative Plan: Possible Post-op intubation/ventilation  Informed Consent: I have reviewed the patients History and Physical, chart, labs and discussed the procedure including the risks, benefits and alternatives for the proposed anesthesia with the patient or authorized representative who has indicated his/her understanding and acceptance.     Dental advisory given  Plan Discussed with: CRNA and Surgeon  Anesthesia Plan Comments:         Anesthesia Quick Evaluation

## 2020-08-07 NOTE — Discharge Instructions (Signed)
Upper Endoscopy, Adult, Care After This sheet gives you information about how to care for yourself after your procedure. Your health care provider may also give you more specific instructions. If you have problems or questions, contact your health care provider. What can I expect after the procedure? After the procedure, it is common to have:  A sore throat.  Mild stomach pain or discomfort.  Bloating.  Nausea. Follow these instructions at home:  Follow instructions from your health care provider about what to eat or drink after your procedure.  Return to your normal activities as told by your health care provider. Ask your health care provider what activities are safe for you.  Take over-the-counter and prescription medicines only as told by your health care provider.  If you were given a sedative during the procedure, it can affect you for several hours. Do not drive or operate machinery until your health care provider says that it is safe.  Keep all follow-up visits as told by your health care provider. This is important.   Contact a health care provider if you have:  A sore throat that lasts longer than one day.  Trouble swallowing. Get help right away if:  You vomit blood or your vomit looks like coffee grounds.  You have: ? A fever. ? Bloody, black, or tarry stools. ? A severe sore throat or you cannot swallow. ? Difficulty breathing. ? Severe pain in your chest or abdomen. Summary  After the procedure, it is common to have a sore throat, mild stomach discomfort, bloating, and nausea.  If you were given a sedative during the procedure, it can affect you for several hours. Do not drive or operate machinery until your health care provider says that it is safe.  Follow instructions from your health care provider about what to eat or drink after your procedure.  Return to your normal activities as told by your health care provider. This information is not intended to  replace advice given to you by your health care provider. Make sure you discuss any questions you have with your health care provider. Document Revised: 06/07/2019 Document Reviewed: 11/09/2017 Elsevier Patient Education  2021 [place] are being discharged to home.  Resume your previous diet.  Take Protonix (pantoprazole) 40 mg by mouth once a day indefinitely.

## 2020-08-07 NOTE — Op Note (Signed)
[facility] Patient Name: April Guerra Procedure Date: 08/07/2020 8:54 AM MRN: [id number] Date of Birth: 24-Sep-1945 Attending MD: Maylon Peppers ,  CSN: [id number] Age: 75 Admit Type: Outpatient Procedure:                Upper GI endoscopy Indications:              Follow-up of peptic ulcer Providers:                Maylon Peppers, Lambert Mody, Aram Candela Referring MD:              Medicines:                Monitored Anesthesia Care Complications:            No immediate complications. Estimated Blood Loss:     Estimated blood loss: none. Procedure:                Pre-Anesthesia Assessment:                           - Prior to the procedure, a History and Physical                            was performed, and patient medications, allergies                            and sensitivities were reviewed. The patient's                            tolerance of previous anesthesia was reviewed.                           - The risks and benefits of the procedure and the                            sedation options and risks were discussed with the                            patient. All questions were answered and informed                            consent was obtained.                           - ASA Grade Assessment: III - A patient with severe                            systemic disease.                           After obtaining informed consent, the endoscope was                            passed under direct vision. Throughout the                            procedure, the patient's blood pressure,  pulse, and                            oxygen saturations were monitored continuously. The                            [id number] was introduced through the mouth,                            and advanced to the second part of duodenum. The                            upper GI endoscopy was accomplished without                            difficulty. The patient tolerated the  procedure                            well. Scope In: 9:07:46 AM Scope Out: 9:11:25 AM Total Procedure Duration: 0 hours 3 minutes 39 seconds  Findings:      A 2 cm hiatal hernia was present.      The entire examined stomach was normal. Upon careful evaluation of the       gastric mucosa, the area of previous ulceration was completely healed.      The examined duodenum was normal. Impression:               - 2 cm hiatal hernia.                           - Normal stomach.                           - Normal examined duodenum.                           - No specimens collected. Moderate Sedation:      Per Anesthesia Care Recommendation:           - Discharge patient to home (ambulatory).                           - Resume previous diet.                           - Use Protonix (pantoprazole) 40 mg PO daily                            indefinitely. Procedure Code(s):        --- Professional ---                           [phone], Esophagogastroduodenoscopy, flexible,                            transoral; diagnostic, including collection of  specimen(s) by brushing or washing, when performed                            (separate procedure) Diagnosis Code(s):        --- Professional ---                           K44.9, Diaphragmatic hernia without obstruction or                            gangrene                           K27.9, Peptic ulcer, site unspecified, unspecified                            as acute or chronic, without hemorrhage or                            perforation CPT copyright 2019 American Medical Association. All rights reserved. The codes documented in this report are preliminary and upon coder review may  be revised to meet current compliance requirements. Maylon Peppers, MD Maylon Peppers,  08/07/2020 9:15:39 AM This report has been signed electronically. Number of Addenda: 0

## 2020-08-07 NOTE — H&P (Signed)
April Guerra is an 75 y.o. female.   Chief Complaint: gastric ulcer HPI: 75 year old female With past medical history of atrial fibrillation, hypertension, hyperlipidemia, asthma, anxiety, arthritis and history of gastric ulcer coming to the hospital for evaluation of gastric ulcer.  Patient was admitted to the hospital on 05/08/2020.  She had an episode of coffee-ground emesis and was found to have a nonbleeding ulcer with central eschar and bulb ulcers.  Biopsies were negative for H. pylori or malignancy.  She was given PPI twice a day and has not presented any more melena since then denies any nausea, vomiting, fever, chills, hematochezia.  Her hemoglobin has been stable since her hospitalization.  Past Medical History:  Diagnosis Date  . A-fib ([place])   . Anxiety   . Arthritis   . Asthma    hx of   . Cancer ([place])    ovarian  . Dyspnea    with exertion   . H/O seasonal allergies   . Heart murmur    as aa child   . Hypercholesteremia   . Hypertension   . Spinal stenosis     Past Surgical History:  Procedure Laterality Date  . ABDOMINAL HYSTERECTOMY    . BIOPSY  05/08/2020   Procedure: BIOPSY;  Surgeon: Harvel Quale, MD;  [facility];  Service: Gastroenterology;;  . BREAST SURGERY    . CARDIAC CATHETERIZATION    . CHOLECYSTECTOMY    . COLONOSCOPY N/A 08/14/2015   Procedure: COLONOSCOPY;  Surgeon: Aviva Signs, MD;  Location: [facility];  Service: Gastroenterology;  Laterality: N/A;  . ESOPHAGOGASTRODUODENOSCOPY (EGD) WITH PROPOFOL N/A 05/08/2020   Procedure: ESOPHAGOGASTRODUODENOSCOPY (EGD) WITH PROPOFOL;  Surgeon: Harvel Quale, MD;  Location: [facility];  Service: Gastroenterology;  Laterality: N/A;  . IR IMAGING GUIDED PORT INSERTION  06/04/2020  . ROBOTIC ASSISTED TOTAL HYSTERECTOMY WITH BILATERAL SALPINGO OOPHERECTOMY N/A 05/03/2020   Procedure: XI ROBOTIC ASSISTED TOTAL HYSTERECTOMY WITH BILATERAL SALPINGO OOPHORECTOMY,  OMENTECTOMY,RADICAL TUMOR DEBULKING, RIGID PROSTOSCOPY;  Surgeon: Everitt Amber, MD;  Location: [facility];  Service: Gynecology;  Laterality: N/A;  . THROMBECTOMY FEMORAL ARTERY Right 12/17/2013   Procedure: REPAIR OF RIGHT FEMORAL ARTERY PSEUDOANEURYSM;  Surgeon: Elam Dutch, MD;  Location: [facility];  Service: Vascular;  Laterality: Right;    Family History  Problem Relation Age of Onset  . Hypertension Mother   . Heart failure Mother   . Asthma Mother   . Ulcers Mother   . Benign prostatic hyperplasia Father   . Ulcers Father   . Esophageal varices Father   . Atrial fibrillation Sister   . Atrial fibrillation Brother   . Heart disease Brother   . Melanoma Maternal Aunt   . Colon cancer Neg Hx    Social History:  reports that she has never smoked. She has never used smokeless tobacco. She reports that she does not drink alcohol and does not use drugs.  Allergies:  Allergies  Allergen Reactions  . Morphine And Related Nausea And Vomiting  . Zofran [Ondansetron]     Causes minor constipation, tolerates low doses      Medications Prior to Admission  Medication Sig Dispense Refill  . acetaminophen (TYLENOL) 500 MG tablet Take 1,000 mg by mouth 3 (three) times daily.    Marland Kitchen albuterol (VENTOLIN HFA) 108 (90 Base) MCG/ACT inhaler Inhale 2 puffs into the lungs every 4 (four) hours as needed for wheezing or shortness of breath. 18 g 2  . amLODipine (NORVASC) 10 MG  tablet Take 1 tablet (10 mg total) by mouth daily with lunch. For BP 30 tablet 3  . Calcium Carb-Cholecalciferol (CALCIUM 600/VITAMIN D PO) Take 600 mg by mouth in the morning and at bedtime.    Marland Kitchen CARBOPLATIN IV Inject 1 application into the vein every 21 ( twenty-one) days.    . cetirizine (ZYRTEC) 10 MG tablet Take 10 mg by mouth daily.    . Cholecalciferol (VITAMIN D) 125 MCG (5000 UT) CAPS Take 5,000 Units by mouth daily.    . Coenzyme Q10 (COQ10) 100 MG CAPS Take 100 mg by mouth at bedtime.     . cyclobenzaprine (FLEXERIL) 10  MG tablet Take 5 mg by mouth See admin instructions. Take 5 mg at night, may take a second 5 mg dose during the day as needed for muscle spasms    . fluticasone (FLONASE) 50 MCG/ACT nasal spray Place 1 spray into both nostrils daily.     Marland Kitchen gabapentin (NEURONTIN) 300 MG capsule Take 1 capsule (300 mg total) by mouth 3 (three) times daily. 90 capsule 2  . lisinopril (ZESTRIL) 30 MG tablet Take 1 tablet (30 mg total) by mouth in the morning. Okay to restart around Monday, 05/14/2020 if blood pressure stable (Patient taking differently: Take 30 mg by mouth in the morning.) 30 tablet 3  . Magnesium 100 MG TABS Take 50 mg by mouth at bedtime.    . melatonin 5 MG TABS Take 2.5 mg by mouth at bedtime as needed (Sleep).    . metoprolol succinate (TOPROL-XL) 100 MG 24 hr tablet Take 100 mg by mouth in the morning. Take with or immediately following a meal.    . Multiple Vitamins-Minerals (CENTRUM SILVER ADULT 50+ PO) Take 1 tablet by mouth daily.    . Omega-3 Fatty Acids (FISH OIL) 1000 MG CAPS Take 1,000 mg by mouth daily.    Marland Kitchen PACLITAXEL IV Inject 1 application into the vein every 21 ( twenty-one) days.    . pantoprazole (PROTONIX) 40 MG tablet Take 1 tablet (40 mg total) by mouth 2 (two) times daily before a meal. 60 tablet 1  . simvastatin (ZOCOR) 40 MG tablet Take 0.5 tablets (20 mg total) by mouth daily. (Patient taking differently: Take 40 mg by mouth at bedtime.) 30 tablet 2  . sodium chloride (OCEAN) 0.65 % SOLN nasal spray Place 1 spray into both nostrils See admin instructions. Use 1 spray in each nostril in the morning may use a second dose at night as needed for congestion    . tetrahydrozoline-zinc (VISINE-AC) 0.05-0.25 % ophthalmic solution Place 1 drop into both eyes daily.    Marland Kitchen triamcinolone (KENALOG) 0.1 % Apply 1 application topically daily as needed (irritation).    . vitamin C (ASCORBIC ACID) 500 MG tablet Take 500 mg by mouth daily.    . Vitamins A & D (VITAMIN A & D) ointment Apply 1  application topically daily as needed for dry skin (irritation).    Marland Kitchen dexamethasone (DECADRON) 4 MG tablet Take 1 tablet (4 mg total) by mouth daily. Start the day after carboplatin chemotherapy for 3 days. (Patient taking differently: Take 4 mg by mouth See admin instructions. Start the day after carboplatin chemotherapy for 3 days.) 20 tablet 0  . diltiazem (CARDIZEM) 30 MG tablet Take 1 tablet every 4 hours AS NEEDED for AFIB heart rate >100 (Patient taking differently: Take 30 mg by mouth every 4 (four) hours as needed (AFIB heart rate > 100).) 30 tablet 3  .  lidocaine-prilocaine (EMLA) cream Apply to affected area once (Patient taking differently: Apply topically as needed (once every three weeks before chemo).) 30 g 3  . ondansetron (ZOFRAN) 4 MG tablet Take 1 tablet (4 mg total) by mouth every 6 (six) hours as needed for nausea. 20 tablet 0  . ondansetron (ZOFRAN) 8 MG tablet Take 1 tablet (8 mg total) by mouth 2 (two) times daily as needed for refractory nausea / vomiting. Start on day 3 after carboplatin chemo. 30 tablet 1  . polyethylene glycol powder (GLYCOLAX/MIRALAX) 17 GM/SCOOP powder Take 17 g by mouth daily as needed for mild constipation or moderate constipation (With lunch).    . prochlorperazine (COMPAZINE) 10 MG tablet Take 1 tablet (10 mg total) by mouth every 6 (six) hours as needed (Nausea or vomiting). (Patient taking differently: Take 10 mg by mouth every 6 (six) hours as needed for nausea or vomiting.) 30 tablet 1  . senna (SENOKOT) 8.6 MG tablet Take 1 tablet by mouth daily as needed for constipation.      Results for orders placed or performed during the hospital encounter of 08/06/20 (from the past 48 hour(s))  SARS CORONAVIRUS 2 (TAT 6-24 HRS) Nasopharyngeal Nasopharyngeal Swab     Status: None   Collection Time: 08/06/20 10:55 AM   Specimen: Nasopharyngeal Swab  Result Value Ref Range   SARS Coronavirus 2 NEGATIVE NEGATIVE    Comment: (NOTE) SARS-CoV-2 target nucleic  acids are NOT DETECTED.  The SARS-CoV-2 RNA is generally detectable in upper and lower respiratory specimens during the acute phase of infection. Negative results do not preclude SARS-CoV-2 infection, do not rule out co-infections with other pathogens, and should not be used as the sole basis for treatment or other patient management decisions. Negative results must be combined with clinical observations, patient history, and epidemiological information. The expected result is Negative.  Fact Sheet for Patients: SugarRoll.be  Fact Sheet for Healthcare Providers: https://www.woods-mathews.com/  This test is not yet approved or cleared by the [place] FDA and  has been authorized for detection and/or diagnosis of SARS-CoV-2 by FDA under an Emergency Use Authorization (EUA). This EUA will remain  in effect (meaning this test can be used) for the duration of the COVID-19 declaration under Se ction 564(b)(1) of the Act, 21 U.S.C. section 360bbb-3(b)(1), unless the authorization is terminated or revoked sooner.  Performed at [facility], [address] [place], [place] [address]    No results found.  Review of Systems  Constitutional: Negative.   HENT: Negative.   Eyes: Negative.   Respiratory: Negative.   Cardiovascular: Negative.   Gastrointestinal: Negative.   Endocrine: Negative.   Genitourinary: Negative.   Musculoskeletal: Negative.   Skin: Negative.   Allergic/Immunologic: Negative.   Neurological: Negative.   Hematological: Negative.   Psychiatric/Behavioral: Negative.     Blood pressure (!) 166/97, pulse 83, temperature 97.9 F (36.6 C), temperature source Oral, resp. rate 19, height 4\' 10"  (1.473 m), weight 75.4 kg, SpO2 100 %. Physical Exam  GENERAL: The patient is AO x3, in no acute distress. HEENT: Head is normocephalic and atraumatic. EOMI are intact. Mouth is well hydrated and without lesions. NECK:  Supple. No masses LUNGS: Clear to auscultation. No presence of rhonchi/wheezing/rales. Adequate chest expansion HEART: RRR, normal s1 and s2. ABDOMEN: Soft, nontender, no guarding, no peritoneal signs, and nondistended. BS +. No masses. EXTREMITIES: Without any cyanosis, clubbing, rash, lesions or edema. NEUROLOGIC: AOx3, no focal motor deficit. SKIN: no jaundice, no rashes  Assessment/Plan 75 year old female With past medical history of atrial fibrillation, hypertension, hyperlipidemia, asthma, anxiety, arthritis and history of gastric ulcer coming to the hospital for evaluation of gastric ulcer.  We will proceed with EGD today  Harvel Quale, MD 08/07/2020, 7:40 AM

## 2020-08-07 NOTE — Transfer of Care (Signed)
Immediate Anesthesia Transfer of Care Note  Patient: April Guerra  Procedure(s) Performed: ESOPHAGOGASTRODUODENOSCOPY (EGD) WITH PROPOFOL (N/A )  Patient Location: Endoscopy Unit  Anesthesia Type:General  Level of Consciousness: awake  Airway & Oxygen Therapy: Patient Spontanous Breathing  Post-op Assessment: Report given to RN  Post vital signs: Reviewed and stable  Last Vitals:  Vitals Value Taken Time  BP    Temp    Pulse    Resp    SpO2      Last Pain:  Vitals:   08/07/20 0904  TempSrc:   PainSc: 0-No pain      Patients Stated Pain Goal: 6 (03/70/48 8891)  Complications: No complications documented.

## 2020-08-07 NOTE — Anesthesia Postprocedure Evaluation (Signed)
Anesthesia Post Note  Patient: April Guerra  Procedure(s) Performed: ESOPHAGOGASTRODUODENOSCOPY (EGD) WITH PROPOFOL (N/A )  Patient location during evaluation: Endoscopy Anesthesia Type: General Level of consciousness: awake and oriented Pain management: pain level controlled Vital Signs Assessment: post-procedure vital signs reviewed and stable Respiratory status: spontaneous breathing Cardiovascular status: blood pressure returned to baseline and stable Postop Assessment: no apparent nausea or vomiting Anesthetic complications: no   No complications documented.   Last Vitals:  Vitals:   08/07/20 0715  BP: (!) 166/97  Pulse: 83  Resp: 19  Temp: 36.6 C  SpO2: 100%    Last Pain:  Vitals:   08/07/20 0904  TempSrc:   PainSc: 0-No pain                 Cagney Steenson

## 2020-08-07 NOTE — Addendum Note (Signed)
Addendum  created 08/07/20 0931 by Ollen Bowl, CRNA   Charge Capture section accepted

## 2020-08-08 ENCOUNTER — Inpatient Hospital Stay (HOSPITAL_COMMUNITY): Payer: Medicare Other

## 2020-08-08 ENCOUNTER — Other Ambulatory Visit: Payer: Self-pay

## 2020-08-08 ENCOUNTER — Inpatient Hospital Stay (HOSPITAL_COMMUNITY): Payer: Medicare Other | Admitting: Hematology

## 2020-08-08 ENCOUNTER — Inpatient Hospital Stay (HOSPITAL_COMMUNITY): Payer: Medicare Other | Attending: Hematology

## 2020-08-08 VITALS — BP 127/74 | HR 76 | Temp 97.5°F | Resp 18

## 2020-08-08 VITALS — BP 173/73 | HR 80 | Temp 97.0°F | Resp 18 | Wt 167.6 lb

## 2020-08-08 DIAGNOSIS — G629 Polyneuropathy, unspecified: Secondary | ICD-10-CM | POA: Diagnosis not present

## 2020-08-08 DIAGNOSIS — C561 Malignant neoplasm of right ovary: Secondary | ICD-10-CM | POA: Diagnosis not present

## 2020-08-08 DIAGNOSIS — Z808 Family history of malignant neoplasm of other organs or systems: Secondary | ICD-10-CM | POA: Diagnosis not present

## 2020-08-08 DIAGNOSIS — Z5111 Encounter for antineoplastic chemotherapy: Secondary | ICD-10-CM | POA: Insufficient documentation

## 2020-08-08 DIAGNOSIS — Z79899 Other long term (current) drug therapy: Secondary | ICD-10-CM | POA: Diagnosis not present

## 2020-08-08 DIAGNOSIS — Z842 Family history of other diseases of the genitourinary system: Secondary | ICD-10-CM | POA: Diagnosis not present

## 2020-08-08 DIAGNOSIS — Z836 Family history of other diseases of the respiratory system: Secondary | ICD-10-CM | POA: Insufficient documentation

## 2020-08-08 DIAGNOSIS — E669 Obesity, unspecified: Secondary | ICD-10-CM | POA: Diagnosis not present

## 2020-08-08 DIAGNOSIS — Z7901 Long term (current) use of anticoagulants: Secondary | ICD-10-CM | POA: Insufficient documentation

## 2020-08-08 DIAGNOSIS — K5900 Constipation, unspecified: Secondary | ICD-10-CM | POA: Insufficient documentation

## 2020-08-08 DIAGNOSIS — Z5189 Encounter for other specified aftercare: Secondary | ICD-10-CM | POA: Diagnosis not present

## 2020-08-08 DIAGNOSIS — Z8249 Family history of ischemic heart disease and other diseases of the circulatory system: Secondary | ICD-10-CM | POA: Insufficient documentation

## 2020-08-08 DIAGNOSIS — Z8379 Family history of other diseases of the digestive system: Secondary | ICD-10-CM | POA: Diagnosis not present

## 2020-08-08 DIAGNOSIS — R200 Anesthesia of skin: Secondary | ICD-10-CM | POA: Insufficient documentation

## 2020-08-08 DIAGNOSIS — I4891 Unspecified atrial fibrillation: Secondary | ICD-10-CM | POA: Insufficient documentation

## 2020-08-08 LAB — COMPREHENSIVE METABOLIC PANEL
ALT: 18 U/L (ref 0–44)
AST: 23 U/L (ref 15–41)
Albumin: 4.2 g/dL (ref 3.5–5.0)
Alkaline Phosphatase: 60 U/L (ref 38–126)
Anion gap: 7 (ref 5–15)
BUN: 15 mg/dL (ref 8–23)
CO2: 24 mmol/L (ref 22–32)
Calcium: 9.4 mg/dL (ref 8.9–10.3)
Chloride: 103 mmol/L (ref 98–111)
Creatinine, Ser: 0.52 mg/dL (ref 0.44–1.00)
GFR, Estimated: >60 mL/min (ref >60)
Glucose, Bld: 86 mg/dL (ref 70–99)
Potassium: 3.7 mmol/L (ref 3.5–5.1)
Sodium: 134 mmol/L — ABNORMAL LOW (ref 135–145)
Total Bilirubin: 0.6 mg/dL (ref 0.3–1.2)
Total Protein: 7 g/dL (ref 6.5–8.1)

## 2020-08-08 LAB — CBC WITH DIFFERENTIAL/PLATELET
Abs Immature Granulocytes: 0.01 10*3/uL (ref 0.00–0.07)
Basophils Absolute: 0 10*3/uL (ref 0.0–0.1)
Basophils Relative: 0 %
Eosinophils Absolute: 0.1 10*3/uL (ref 0.0–0.5)
Eosinophils Relative: 1 %
HCT: 34 % — ABNORMAL LOW (ref 36.0–46.0)
Hemoglobin: 11.4 g/dL — ABNORMAL LOW (ref 12.0–15.0)
Immature Granulocytes: 0 %
Lymphocytes Relative: 27 %
Lymphs Abs: 1.2 10*3/uL (ref 0.7–4.0)
MCH: 33.7 pg (ref 26.0–34.0)
MCHC: 33.5 g/dL (ref 30.0–36.0)
MCV: 100.6 fL — ABNORMAL HIGH (ref 80.0–100.0)
Monocytes Absolute: 0.8 10*3/uL (ref 0.1–1.0)
Monocytes Relative: 18 %
Neutro Abs: 2.4 10*3/uL (ref 1.7–7.7)
Neutrophils Relative %: 54 %
Platelets: 257 10*3/uL (ref 150–400)
RBC: 3.38 MIL/uL — ABNORMAL LOW (ref 3.87–5.11)
RDW: 15.4 % (ref 11.5–15.5)
WBC: 4.5 10*3/uL (ref 4.0–10.5)
nRBC: 0 % (ref 0.0–0.2)

## 2020-08-08 LAB — MAGNESIUM: Magnesium: 2 mg/dL (ref 1.7–2.4)

## 2020-08-08 MED ORDER — SODIUM CHLORIDE 0.9% FLUSH
10.0000 mL | INTRAVENOUS | Status: DC | PRN
  Administered 2020-08-08: 10 mL

## 2020-08-08 MED ORDER — SODIUM CHLORIDE 0.9 % IV SOLN
10.0000 mg | Freq: Once | INTRAVENOUS | Status: AC
  Administered 2020-08-08: 10 mg via INTRAVENOUS
  Filled 2020-08-08: qty 10

## 2020-08-08 MED ORDER — HEPARIN SOD (PORK) LOCK FLUSH 100 UNIT/ML IV SOLN
500.0000 [IU] | Freq: Once | INTRAVENOUS | Status: AC | PRN
  Administered 2020-08-08: 500 [IU]

## 2020-08-08 MED ORDER — DIPHENHYDRAMINE HCL 50 MG/ML IJ SOLN
25.0000 mg | Freq: Once | INTRAMUSCULAR | Status: AC
  Administered 2020-08-08: 25 mg via INTRAVENOUS
  Filled 2020-08-08: qty 1

## 2020-08-08 MED ORDER — SODIUM CHLORIDE 0.9 % IV SOLN: Freq: Once | INTRAVENOUS | Status: AC

## 2020-08-08 MED ORDER — SODIUM CHLORIDE 0.9 % IV SOLN
140.0000 mg/m2 | Freq: Once | INTRAVENOUS | Status: AC
  Administered 2020-08-08: 246 mg via INTRAVENOUS
  Filled 2020-08-08: qty 41

## 2020-08-08 MED ORDER — PALONOSETRON HCL INJECTION 0.25 MG/5ML
0.2500 mg | Freq: Once | INTRAVENOUS | Status: AC
  Administered 2020-08-08: 0.25 mg via INTRAVENOUS
  Filled 2020-08-08: qty 5

## 2020-08-08 MED ORDER — SODIUM CHLORIDE 0.9 % IV SOLN
497.4000 mg | Freq: Once | INTRAVENOUS | Status: AC
  Administered 2020-08-08: 500 mg via INTRAVENOUS
  Filled 2020-08-08: qty 50

## 2020-08-08 MED ORDER — SODIUM CHLORIDE 0.9 % IV SOLN
150.0000 mg | Freq: Once | INTRAVENOUS | Status: AC
  Administered 2020-08-08: 150 mg via INTRAVENOUS
  Filled 2020-08-08: qty 5

## 2020-08-08 MED ORDER — FAMOTIDINE IN NACL 20-0.9 MG/50ML-% IV SOLN
20.0000 mg | Freq: Once | INTRAVENOUS | Status: AC
  Administered 2020-08-08: 20 mg via INTRAVENOUS
  Filled 2020-08-08: qty 50

## 2020-08-08 NOTE — Patient Instructions (Addendum)
[facility] Discharge Instructions  You were seen today by Dr. Katragadda. He went over your recent results. You received your treatment today. Dr. Katragadda will see you back in 3 weeks for labs and follow up.   Thank you for choosing [facility] at [facility] to provide your oncology and hematology care.  To afford each patient quality time with our provider, please arrive at least 15 minutes before your scheduled appointment time.   If you have a lab appointment with the Cancer Center please come in thru the Main Entrance and check in at the main information desk  You need to re-schedule your appointment should you arrive 10 or more minutes late.  We strive to give you quality time with our providers, and arriving late affects you and other patients whose appointments are after yours.  Also, if you no show three or more times for appointments you may be dismissed from the clinic at the providers discretion.     Again, thank you for choosing [facility].  Our hope is that these requests will decrease the amount of time that you wait before being seen by our physicians.       _____________________________________________________________  Should you have questions after your visit to [facility], please contact our office at [phone] between the hours of 8:00 a.m. and 4:30 p.m.  Voicemails left after 4:00 p.m. will not be returned until the following business day.  For prescription refill requests, have your pharmacy contact our office and allow 72 hours.    Cancer Center Support Programs:   > Cancer Support Group  2nd Tuesday of the month 1pm-2pm, Journey Room    

## 2020-08-08 NOTE — Progress Notes (Signed)
Patient was assessed by Dr. Delton Coombes and labs have been reviewed.  Patient is okay to proceed with treatment today dose 80%. Primary RN and pharmacy aware.

## 2020-08-08 NOTE — Patient Instructions (Signed)

## 2020-08-08 NOTE — Progress Notes (Signed)
April Guerra, April Guerra [id number]   CLINIC:  Medical Oncology/Hematology  PCP:  Manon Hilding, MD [address] [place] [address] [phone]   REASON FOR VISIT:  Follow-up for right ovarian cancer  PRIOR THERAPY: TAH-BSO on 05/03/2020  NGS Results: Not done  CURRENT THERAPY: Carboplatin, paclitaxel & Aloxi every 3 weeks  BRIEF ONCOLOGIC HISTORY:  Oncology History  Right ovarian epithelial cancer ([place])  05/03/2020 Initial Diagnosis   Right ovarian epithelial cancer ([place])   05/03/2020 Cancer Staging   Staging form: Ovary, Fallopian Tube, and Primary Peritoneal Carcinoma, AJCC 8th Edition - Clinical stage from 05/03/2020: FIGO Stage II, calculated as Stage Unknown (cT2b, cNX, cM0) - Signed by Everitt Amber, MD on 05/24/2020   06/06/2020 -  Chemotherapy    Patient is on Treatment Plan: OVARIAN CARBOPLATIN (AUC 6) / PACLITAXEL (175) Q21D X 6 CYCLES        CANCER STAGING: Cancer Staging Right ovarian epithelial cancer ([place]) Staging form: Ovary, Fallopian Tube, and Primary Peritoneal Carcinoma, AJCC 8th Edition - Clinical stage from 05/03/2020: FIGO Stage II, calculated as Stage Unknown (cT2b, cNX, cM0) - Signed by Everitt Amber, MD on 05/24/2020   INTERVAL HISTORY:  April Guerra, a 75 y.o. female, returns for routine follow-up and consideration for next cycle of chemotherapy. April Guerra was last seen on 07/18/2020.  Due for cycle #4 of carboplatin, paclitaxel and Aloxi today.   Overall, she tells me she has been feeling pretty well. She tolerated the previous treatment well. She continues having intermittent numbness in her feet and props them up on a heating pad. She reports feeling mild nausea 4 days after chemo and takes Compazine as needed. She takes Miralax and occasional Dulcolax for her constipation, which occurs for the first week after chemo. She is taking Protonix daily and her ulcer has healed up. She denies having skin rashes,  though her skin is dry and she applies a lotion.  Her genetics blood draw were drawn about 3 weeks ago.  Overall, she feels ready for next cycle of chemo today.    REVIEW OF SYSTEMS:  Review of Systems  Constitutional: Positive for appetite change (75%) and fatigue (50%).  Gastrointestinal: Positive for constipation (x 1 week post chemo) and nausea (on 4th day post chemo).  Skin: Negative for rash.  Neurological: Positive for numbness (intermittent in feet).  All other systems reviewed and are negative.   PAST MEDICAL/SURGICAL HISTORY:  Past Medical History:  Diagnosis Date  . A-fib ([place])   . Anxiety   . Arthritis   . Asthma    hx of   . Cancer ([place])    ovarian  . Dyspnea    with exertion   . H/O seasonal allergies   . Heart murmur    as aa child   . Hypercholesteremia   . Hypertension   . Spinal stenosis    Past Surgical History:  Procedure Laterality Date  . ABDOMINAL HYSTERECTOMY    . BIOPSY  05/08/2020   Procedure: BIOPSY;  Surgeon: Harvel Quale, MD;  [facility];  Service: Gastroenterology;;  . BREAST SURGERY    . CARDIAC CATHETERIZATION    . CHOLECYSTECTOMY    . COLONOSCOPY N/A 08/14/2015   Procedure: COLONOSCOPY;  Surgeon: Aviva Signs, MD;  [facility];  Service: Gastroenterology;  Laterality: N/A;  . ESOPHAGOGASTRODUODENOSCOPY (EGD) WITH PROPOFOL N/A 05/08/2020   Procedure: ESOPHAGOGASTRODUODENOSCOPY (EGD) WITH PROPOFOL;  Surgeon: Jenetta Downer  Roney Marion, MD;  Location: [facility];  Service: Gastroenterology;  Laterality: N/A;  . IR IMAGING GUIDED PORT INSERTION  06/04/2020  . ROBOTIC ASSISTED TOTAL HYSTERECTOMY WITH BILATERAL SALPINGO OOPHERECTOMY N/A 05/03/2020   Procedure: XI ROBOTIC ASSISTED TOTAL HYSTERECTOMY WITH BILATERAL SALPINGO OOPHORECTOMY, OMENTECTOMY,RADICAL TUMOR DEBULKING, RIGID PROSTOSCOPY;  Surgeon: Everitt Amber, MD;  Location: [facility];  Service: Gynecology;  Laterality: N/A;  . THROMBECTOMY FEMORAL  ARTERY Right 12/17/2013   Procedure: REPAIR OF RIGHT FEMORAL ARTERY PSEUDOANEURYSM;  Surgeon: Elam Dutch, MD;  Location: [facility];  Service: Vascular;  Laterality: Right;    SOCIAL HISTORY:  Social History   Socioeconomic History  . Marital status: Married    Spouse name: Not on file  . Number of children: 4  . Years of education: Not on file  . Highest education level: Not on file  Occupational History  . Occupation: retired Marine scientist  Tobacco Use  . Smoking status: Never Smoker  . Smokeless tobacco: Never Used  Vaping Use  . Vaping Use: Never used  Substance and Sexual Activity  . Alcohol use: No  . Drug use: No  . Sexual activity: Not Currently  Other Topics Concern  . Not on file  Social History Narrative  . Not on file   Social Determinants of Health   Financial Resource Strain: Low Risk   . Difficulty of Paying Living Expenses: Not hard at all  Food Insecurity: No Food Insecurity  . Worried About Charity fundraiser in the Last Year: Never true  . Ran Out of Food in the Last Year: Never true  Transportation Needs: No Transportation Needs  . Lack of Transportation (Medical): No  . Lack of Transportation (Non-Medical): No  Physical Activity: Insufficiently Active  . Days of Exercise per Week: 7 days  . Minutes of Exercise per Session: 10 min  Stress: No Stress Concern Present  . Feeling of Stress : Not at all  Social Connections: Moderately Integrated  . Frequency of Communication with Friends and Family: More than three times a week  . Frequency of Social Gatherings with Friends and Family: More than three times a week  . Attends Religious Services: More than 4 times per year  . Active Member of Clubs or Organizations: No  . Attends Archivist Meetings: Never  . Marital Status: Married  Human resources officer Violence: Not At Risk  . Fear of Current or Ex-Partner: No  . Emotionally Abused: No  . Physically Abused: No  . Sexually Abused: No    FAMILY  HISTORY:  Family History  Problem Relation Age of Onset  . Hypertension Mother   . Heart failure Mother   . Asthma Mother   . Ulcers Mother   . Benign prostatic hyperplasia Father   . Ulcers Father   . Esophageal varices Father   . Atrial fibrillation Sister   . Atrial fibrillation Brother   . Heart disease Brother   . Melanoma Maternal Aunt   . Colon cancer Neg Hx     CURRENT MEDICATIONS:  Current Outpatient Medications  Medication Sig Dispense Refill  . acetaminophen (TYLENOL) 500 MG tablet Take 1,000 mg by mouth 3 (three) times daily.    Marland Kitchen amLODipine (NORVASC) 10 MG tablet Take 1 tablet (10 mg total) by mouth daily with lunch. For BP 30 tablet 3  . Calcium Carb-Cholecalciferol (CALCIUM 600/VITAMIN D PO) Take 600 mg by mouth in the morning and at bedtime.    Marland Kitchen CARBOPLATIN IV Inject 1 application  into the vein every 21 ( twenty-one) days.    . cetirizine (ZYRTEC) 10 MG tablet Take 10 mg by mouth daily.    . Cholecalciferol (VITAMIN D) 125 MCG (5000 UT) CAPS Take 5,000 Units by mouth daily.    . Coenzyme Q10 (COQ10) 100 MG CAPS Take 100 mg by mouth at bedtime.     . cyclobenzaprine (FLEXERIL) 10 MG tablet Take 5 mg by mouth See admin instructions. Take 5 mg at night, may take a second 5 mg dose during the day as needed for muscle spasms    . dexamethasone (DECADRON) 4 MG tablet Take 1 tablet (4 mg total) by mouth daily. Start the day after carboplatin chemotherapy for 3 days. (Patient taking differently: Take 4 mg by mouth See admin instructions. Start the day after carboplatin chemotherapy for 3 days.) 20 tablet 0  . diltiazem (CARDIZEM) 30 MG tablet Take 1 tablet every 4 hours AS NEEDED for AFIB heart rate >100 (Patient taking differently: Take 30 mg by mouth every 4 (four) hours as needed (AFIB heart rate > 100).) 30 tablet 3  . fluticasone (FLONASE) 50 MCG/ACT nasal spray Place 1 spray into both nostrils daily.     Marland Kitchen gabapentin (NEURONTIN) 300 MG capsule Take 1 capsule (300 mg  total) by mouth 3 (three) times daily. 90 capsule 2  . lisinopril (ZESTRIL) 30 MG tablet Take 1 tablet (30 mg total) by mouth in the morning. Okay to restart around Monday, 05/14/2020 if blood pressure stable (Patient taking differently: Take 30 mg by mouth in the morning.) 30 tablet 3  . Magnesium 100 MG TABS Take 50 mg by mouth at bedtime.    . melatonin 5 MG TABS Take 2.5 mg by mouth at bedtime as needed (Sleep).    . metoprolol succinate (TOPROL-XL) 100 MG 24 hr tablet Take 100 mg by mouth in the morning. Take with or immediately following a meal.    . Multiple Vitamins-Minerals (CENTRUM SILVER ADULT 50+ PO) Take 1 tablet by mouth daily.    . Omega-3 Fatty Acids (FISH OIL) 1000 MG CAPS Take 1,000 mg by mouth daily.    Marland Kitchen PACLITAXEL IV Inject 1 application into the vein every 21 ( twenty-one) days.    . pantoprazole (PROTONIX) 40 MG tablet Take 1 tablet (40 mg total) by mouth daily. 90 tablet 3  . simvastatin (ZOCOR) 40 MG tablet Take 0.5 tablets (20 mg total) by mouth daily. (Patient taking differently: Take 40 mg by mouth at bedtime.) 30 tablet 2  . sodium chloride (OCEAN) 0.65 % SOLN nasal spray Place 1 spray into both nostrils See admin instructions. Use 1 spray in each nostril in the morning may use a second dose at night as needed for congestion    . tetrahydrozoline-zinc (VISINE-AC) 0.05-0.25 % ophthalmic solution Place 1 drop into both eyes daily.    . vitamin C (ASCORBIC ACID) 500 MG tablet Take 500 mg by mouth daily.    Marland Kitchen albuterol (VENTOLIN HFA) 108 (90 Base) MCG/ACT inhaler Inhale 2 puffs into the lungs every 4 (four) hours as needed for wheezing or shortness of breath. (Patient not taking: Reported on 08/08/2020) 18 g 2  . lidocaine-prilocaine (EMLA) cream Apply to affected area once (Patient not taking: Reported on 08/08/2020) 30 g 3  . ondansetron (ZOFRAN) 4 MG tablet Take 1 tablet (4 mg total) by mouth every 6 (six) hours as needed for nausea. (Patient not taking: Reported on  08/08/2020) 20 tablet 0  . polyethylene glycol powder (  GLYCOLAX/MIRALAX) 17 GM/SCOOP powder Take 17 g by mouth daily as needed for mild constipation or moderate constipation (With lunch). (Patient not taking: Reported on 08/08/2020)    . prochlorperazine (COMPAZINE) 10 MG tablet Take 1 tablet (10 mg total) by mouth every 6 (six) hours as needed (Nausea or vomiting). (Patient not taking: Reported on 08/08/2020) 30 tablet 1  . senna (SENOKOT) 8.6 MG tablet Take 1 tablet by mouth daily as needed for constipation. (Patient not taking: Reported on 08/08/2020)    . triamcinolone (KENALOG) 0.1 % Apply 1 application topically daily as needed (irritation). (Patient not taking: Reported on 08/08/2020)    . Vitamins A & D (VITAMIN A & D) ointment Apply 1 application topically daily as needed for dry skin (irritation). (Patient not taking: Reported on 08/08/2020)     No current facility-administered medications for this visit.    ALLERGIES:  Allergies  Allergen Reactions  . Morphine And Related Nausea And Vomiting  . Zofran [Ondansetron]     Causes minor constipation, tolerates low doses      PHYSICAL EXAM:  Performance status (ECOG): 1 - Symptomatic but completely ambulatory  Vitals:   08/08/20 0851  BP: (!) 173/73  Pulse: 80  Resp: 18  Temp: (!) 97 F (36.1 C)  SpO2: 100%   Wt Readings from Last 3 Encounters:  08/08/20 167 lb 9.6 oz (76 kg)  08/07/20 166 lb 3.2 oz (75.4 kg)  07/18/20 166 lb 3.2 oz (75.4 kg)   Physical Exam Vitals reviewed.  Constitutional:      Appearance: Normal appearance. She is obese.  Cardiovascular:     Rate and Rhythm: Normal rate and regular rhythm.     Pulses: Normal pulses.     Heart sounds: Normal heart sounds.  Pulmonary:     Effort: Pulmonary effort is normal.     Breath sounds: Normal breath sounds.  Chest:     Comments: Port-a-Cath in R chest Abdominal:     Palpations: Abdomen is soft. There is no hepatomegaly, splenomegaly or mass.     Tenderness:  There is no abdominal tenderness.     Hernia: No hernia is present.  Musculoskeletal:     Right lower leg: No edema.     Left lower leg: No edema.  Skin:    General: Skin is dry.  Neurological:     General: No focal deficit present.     Mental Status: She is alert and oriented to person, place, and time.  Psychiatric:        Mood and Affect: Mood normal.        Behavior: Behavior normal.     LABORATORY DATA:  I have reviewed the labs as listed.  CBC Latest Ref Rng & Units 08/08/2020 07/18/2020 06/27/2020  WBC 4.0 - 10.5 K/uL 4.5 5.6 6.2  Hemoglobin 12.0 - 15.0 g/dL 11.4(L) 11.2(L) 10.7(L)  Hematocrit 36.0 - 46.0 % 34.0(L) 34.2(L) 32.4(L)  Platelets 150 - 400 K/uL 257 415(H) 369   CMP Latest Ref Rng & Units 08/08/2020 07/18/2020 06/27/2020  Glucose 70 - 99 mg/dL 86 79 91  BUN 8 - 23 mg/dL 15 14 17   Creatinine 0.44 - 1.00 mg/dL 0.52 0.50 0.55  Sodium 135 - 145 mmol/L 134(L) 132(L) 133(L)  Potassium 3.5 - 5.1 mmol/L 3.7 4.1 3.8  Chloride 98 - 111 mmol/L 103 101 100  CO2 22 - 32 mmol/L 24 24 24   Calcium 8.9 - 10.3 mg/dL 9.4 9.5 9.1  Total Protein 6.5 - 8.1 g/dL 7.0  7.2 7.1  Total Bilirubin 0.3 - 1.2 mg/dL 0.6 0.4 0.5  Alkaline Phos 38 - 126 U/L 60 63 64  AST 15 - 41 U/L 23 21 23   ALT 0 - 44 U/L 18 20 23   No results found for: CA125  DIAGNOSTIC IMAGING:  I have independently reviewed the scans and discussed with the patient. No results found.   ASSESSMENT:  1.Stage II clear cell right ovarian cancer: -Status post robotic assisted total hysterectomy, BSO, omentectomy and staging on 05/03/2020. -[id number] on 04/20/2020 -Evaluated by Dr. Denman George on 05/24/2020. -6 cycles of carboplatin and paclitaxel was recommended. -Somatic and germline mutation testing recommended. She can be a candidate for PARPinhibitor maintenance therapy if any mutations found. -Cycle 1 of carboplatin and paclitaxel started on 06/06/2020.  2. Atrial fibrillation: -Was on Eliquis. -She had  postoperative GI bleed when Eliquis was discontinued.   PLAN:  1.Stage II clear-cell right ovarian cancer: -She has tolerated cycle 3 reasonably well.  She had EGD on 08/07/2020 which was negative for ulcers. -Reviewed her labs today which showed normal LFTs.  White count is normal with normal platelet count. -Proceed with cycle 4 today.  RTC 3 weeks for follow-up.  2. Peripheral neuropathy: -Continue gabapentin 300 mg 3 times a day.  Numbness is stable.  3.  Genetic testing: -We are awaiting results of genetic testing.   Orders placed this encounter:  Orders Placed This Encounter  Procedures  . CBC with Differential/Platelet  . Comprehensive metabolic panel  . Magnesium     Derek Jack, MD [place] [phone]   I, Milinda Antis, am acting as a scribe for Dr. Sanda Linger.  I, Derek Jack MD, have reviewed the above documentation for accuracy and completeness, and I agree with the above.

## 2020-08-08 NOTE — Progress Notes (Signed)
Confirmed maintaining 80% off original doses.  Henreitta Leber, PharmD

## 2020-08-08 NOTE — Progress Notes (Signed)
Message received from [facility] LPN/ Dr. Delton Coombes to proceed with treatment.   Treatment given today per MD orders. Tolerated infusion without adverse affects. Vital signs stable. No complaints at this time. Discharged from clinic via wheel chair in stable condition. Alert and oriented x 3. F/U with [facility] as scheduled.

## 2020-08-09 LAB — CA 125: Cancer Antigen (CA) 125: 46.5 U/mL — ABNORMAL HIGH (ref 0.0–38.1)

## 2020-08-10 ENCOUNTER — Encounter (HOSPITAL_COMMUNITY): Payer: Self-pay | Admitting: Gastroenterology

## 2020-08-10 ENCOUNTER — Other Ambulatory Visit: Payer: Self-pay

## 2020-08-10 ENCOUNTER — Inpatient Hospital Stay (HOSPITAL_COMMUNITY): Payer: Medicare Other

## 2020-08-10 VITALS — BP 166/74 | HR 74 | Temp 96.4°F | Resp 17 | Wt 167.1 lb

## 2020-08-10 DIAGNOSIS — Z5111 Encounter for antineoplastic chemotherapy: Secondary | ICD-10-CM | POA: Diagnosis not present

## 2020-08-10 DIAGNOSIS — C561 Malignant neoplasm of right ovary: Secondary | ICD-10-CM

## 2020-08-10 MED ORDER — PEGFILGRASTIM-CBQV 6 MG/0.6ML ~~LOC~~ SOSY
6.0000 mg | PREFILLED_SYRINGE | Freq: Once | SUBCUTANEOUS | Status: AC
Start: 2020-08-10 — End: 2020-08-10
  Administered 2020-08-10: 6 mg via SUBCUTANEOUS
  Filled 2020-08-10: qty 0.6

## 2020-08-10 NOTE — Progress Notes (Signed)
Patient tolerated Udencya injection with no complaints voiced.  Site clean and dry with no bruising or swelling noted.  No complaints of pain.  Discharged with vital signs stable and no signs or symptoms of distress noted.  

## 2020-08-13 ENCOUNTER — Telehealth: Payer: Self-pay | Admitting: Licensed Clinical Social Worker

## 2020-08-14 ENCOUNTER — Encounter: Payer: Self-pay | Admitting: Licensed Clinical Social Worker

## 2020-08-14 ENCOUNTER — Ambulatory Visit: Payer: Self-pay | Admitting: Licensed Clinical Social Worker

## 2020-08-14 DIAGNOSIS — Z1379 Encounter for other screening for genetic and chromosomal anomalies: Secondary | ICD-10-CM

## 2020-08-14 DIAGNOSIS — C561 Malignant neoplasm of right ovary: Secondary | ICD-10-CM

## 2020-08-14 NOTE — Progress Notes (Signed)
HPI:  Ms. Maeder was previously seen in the [place] clinic due to a personal history of ovarian cancer and concerns regarding a hereditary predisposition to cancer. Please refer to our prior cancer genetics clinic note for more information regarding our discussion, assessment and recommendations, at the time. Ms. Lotz recent genetic test results were disclosed to her, as were recommendations warranted by these results. These results and recommendations are discussed in more detail below.  CANCER HISTORY:  Oncology History  Right ovarian epithelial cancer ([place])  05/03/2020 Initial Diagnosis   Right ovarian epithelial cancer ([place])   05/03/2020 Cancer Staging   Staging form: Ovary, Fallopian Tube, and Primary Peritoneal Carcinoma, AJCC 8th Edition - Clinical stage from 05/03/2020: FIGO Stage II, calculated as Stage Unknown (cT2b, cNX, cM0) - Signed by Everitt Amber, MD on 05/24/2020   06/06/2020 -  Chemotherapy    Patient is on Treatment Plan: OVARIAN CARBOPLATIN (AUC 6) / PACLITAXEL (175) Q21D X 6 CYCLES       Genetic Testing   Negative genetic testing. No pathogenic variants identified on the [employer]. The report date is 08/10/2020.  The CancerNext gene panel offered by [employer] includes sequencing and rearrangement analysis for the following 36 genes: APC*, ATM*, AXIN2, BARD1, BMPR1A, BRCA1*, BRCA2*, BRIP1*, CDH1*, CDK4, CDKN2A, CHEK2*, DICER1, MLH1*, MSH2*, MSH3, MSH6*, MUTYH*, NBN, NF1*, NTHL1, PALB2*, PMS2*, PTEN*, RAD51C*, RAD51D*, RECQL, SMAD4, SMARCA4, STK11 and TP53* (sequencing and deletion/duplication); HOXB13, POLD1 and POLE (sequencing only); EPCAM and GREM1 (deletion/duplication only).   Somatic genes analyzed through TumorNext-HRD: ATM, BARD1, BRCA1, BRCA2, BRIP1, CHEK2, MRE11A, NBN, PALB2, RAD51C, RAD51D.     FAMILY HISTORY:  We obtained a detailed, 4-generation family history.  Significant diagnoses are listed below: Family  History  Problem Relation Age of Onset  . Hypertension Mother   . Heart failure Mother   . Asthma Mother   . Ulcers Mother   . Benign prostatic hyperplasia Father   . Ulcers Father   . Esophageal varices Father   . Atrial fibrillation Sister   . Atrial fibrillation Brother   . Heart disease Brother   . Melanoma Maternal Aunt   . Colon cancer Neg Hx    Ms. Lovins has 1 son and 3 daughters, none have had cancer. She had 3 brothers and 2 sisters, no cancers.   Ms. Aaberg father died at 29 and did not have siblings. No known cancers in paternal grandparents; grandmother died at 67, grandfather died at 31 due to TB.   Ms. Lauricella mother died at 52, no cancers. Patient had 5 maternal aunts, 2 maternal uncles. One aunt did have melanoma removed from her leg. Maternal grandfather died at 59. Grandmother died at 42 but patient is unaware of her cause of death.  Ms. Constantine is unaware of previous family history of genetic testing for hereditary cancer risks. Patient's maternal ancestors are of Scottish/Irish descent, and paternal ancestors are of [place] descent. There is no reported Ashkenazi Jewish ancestry. There is no known consanguinity.     GENETIC TEST RESULTS: Genetic testing reported out on 08/10/2020 through the [employer] TumorNext-HRD+CancerNext cancer panel found no pathogenic mutations.   The CancerNext gene panel offered by [employer] includes sequencing and rearrangement analysis for the following 36 genes: APC*, ATM*, AXIN2, BARD1, BMPR1A, BRCA1*, BRCA2*, BRIP1*, CDH1*, CDK4, CDKN2A, CHEK2*, DICER1, MLH1*, MSH2*, MSH3, MSH6*, MUTYH*, NBN, NF1*, NTHL1, PALB2*, PMS2*, PTEN*, RAD51C*, RAD51D*, RECQL, SMAD4, SMARCA4, STK11 and TP53* (sequencing and deletion/duplication); HOXB13, POLD1 and POLE (sequencing  only); EPCAM and GREM1 (deletion/duplication only).   Somatic genes analyzed through TumorNext-HRD: ATM, BARD1, BRCA1, BRCA2, BRIP1, CHEK2, MRE11A, NBN, PALB2, RAD51C, RAD51D.  The  test report has been scanned into EPIC and is located under the Molecular Pathology section of the Results Review tab.  A portion of the result report is included below for reference.     We discussed with Ms. Parisi that because current genetic testing is not perfect, it is possible there may be a gene mutation in one of these genes that current testing cannot detect, but that chance is small.  We also discussed, that there could be another gene that has not yet been discovered, or that we have not yet tested, that is responsible for the cancer diagnoses in the family. It is also possible there is a hereditary cause for the cancer in the family that Ms. Ruehl did not inherit and therefore was not identified in her testing.  Therefore, it is important to remain in touch with cancer genetics in the future so that we can continue to offer Ms. Dunkerson the most up to date genetic testing.   ADDITIONAL GENETIC TESTING: We discussed with Ms. Rehman that her genetic testing was fairly extensive.  If there are genes identified to increase cancer risk that can be analyzed in the future, we would be happy to discuss and coordinate this testing at that time.    CANCER SCREENING RECOMMENDATIONS: Ms. Spruiell test result is considered negative (normal).  This means that we have not identified a hereditary cause for her  personal and family history of cancer at this time. Most cancers happen by chance and this negative test suggests that her cancer may fall into this category.  We also did not identify any mutations in the tumor.  While reassuring, this does not definitively rule out a hereditary predisposition to cancer. It is still possible that there could be genetic mutations that are undetectable by current technology. There could be genetic mutations in genes that have not been tested or identified to increase cancer risk.  Therefore, it is recommended she continue to follow the cancer management and screening guidelines  provided by her oncology and primary healthcare provider.   An individual's cancer risk and medical management are not determined by genetic test results alone. Overall cancer risk assessment incorporates additional factors, including personal medical history, family history, and any available genetic information that may result in a personalized plan for cancer prevention and surveillance.  RECOMMENDATIONS FOR FAMILY MEMBERS:  Relatives in this family might be at some increased risk of developing cancer, over the general population risk, simply due to the family history of cancer.  We recommended female relatives in this family have a yearly mammogram beginning at age 16, or 53 years younger than the earliest onset of cancer, an annual clinical breast exam, and perform monthly breast self-exams. Female relatives in this family should also have a gynecological exam as recommended by their primary provider.  All family members should be referred for colonoscopy starting at age 73.   FOLLOW-UP: Lastly, we discussed with Ms. Mikowski that cancer genetics is a rapidly advancing field and it is possible that new genetic tests will be appropriate for her and/or her family members in the future. We encouraged her to remain in contact with cancer genetics on an annual basis so we can update her personal and family histories and let her know of advances in cancer genetics that may benefit this family.   Our  contact number was provided. Ms. Fregeau questions were answered to her satisfaction, and she knows she is welcome to call us at anytime with additional questions or concerns.   Faith Rogue, MS, [facility] Genetic Counselor [email or web address] Phone: [phone]

## 2020-08-15 NOTE — Telephone Encounter (Signed)
Revealed negative genetic testing.  We discussed that we do not know why she has cancer or why there is cancer in the family. It could be due to a different gene that we are not testing, or something our current technology cannot pick up.  It will be important for her to keep in contact with genetics to learn if additional testing may be needed in the future.  

## 2020-08-29 ENCOUNTER — Other Ambulatory Visit: Payer: Self-pay

## 2020-08-29 ENCOUNTER — Ambulatory Visit (HOSPITAL_COMMUNITY): Payer: Medicare Other

## 2020-08-29 ENCOUNTER — Inpatient Hospital Stay (HOSPITAL_COMMUNITY): Payer: Medicare Other

## 2020-08-29 ENCOUNTER — Inpatient Hospital Stay (HOSPITAL_COMMUNITY): Payer: Medicare Other | Admitting: Hematology

## 2020-08-29 ENCOUNTER — Inpatient Hospital Stay (HOSPITAL_COMMUNITY): Payer: Medicare Other | Attending: Hematology

## 2020-08-29 VITALS — BP 145/66 | HR 82 | Temp 97.0°F | Resp 17 | Wt 165.6 lb

## 2020-08-29 VITALS — BP 150/73 | HR 69 | Temp 98.4°F | Resp 18

## 2020-08-29 DIAGNOSIS — Z885 Allergy status to narcotic agent status: Secondary | ICD-10-CM | POA: Insufficient documentation

## 2020-08-29 DIAGNOSIS — Z836 Family history of other diseases of the respiratory system: Secondary | ICD-10-CM | POA: Insufficient documentation

## 2020-08-29 DIAGNOSIS — Z8543 Personal history of malignant neoplasm of ovary: Secondary | ICD-10-CM | POA: Diagnosis not present

## 2020-08-29 DIAGNOSIS — Z9049 Acquired absence of other specified parts of digestive tract: Secondary | ICD-10-CM | POA: Diagnosis not present

## 2020-08-29 DIAGNOSIS — Z842 Family history of other diseases of the genitourinary system: Secondary | ICD-10-CM | POA: Diagnosis not present

## 2020-08-29 DIAGNOSIS — E669 Obesity, unspecified: Secondary | ICD-10-CM | POA: Insufficient documentation

## 2020-08-29 DIAGNOSIS — Z90722 Acquired absence of ovaries, bilateral: Secondary | ICD-10-CM | POA: Diagnosis not present

## 2020-08-29 DIAGNOSIS — Z5189 Encounter for other specified aftercare: Secondary | ICD-10-CM | POA: Insufficient documentation

## 2020-08-29 DIAGNOSIS — R5383 Other fatigue: Secondary | ICD-10-CM | POA: Diagnosis not present

## 2020-08-29 DIAGNOSIS — R6881 Early satiety: Secondary | ICD-10-CM | POA: Insufficient documentation

## 2020-08-29 DIAGNOSIS — G629 Polyneuropathy, unspecified: Secondary | ICD-10-CM | POA: Insufficient documentation

## 2020-08-29 DIAGNOSIS — Z888 Allergy status to other drugs, medicaments and biological substances status: Secondary | ICD-10-CM | POA: Insufficient documentation

## 2020-08-29 DIAGNOSIS — K91841 Postprocedural hemorrhage and hematoma of a digestive system organ or structure following other procedure: Secondary | ICD-10-CM | POA: Diagnosis not present

## 2020-08-29 DIAGNOSIS — C561 Malignant neoplasm of right ovary: Secondary | ICD-10-CM

## 2020-08-29 DIAGNOSIS — Z7901 Long term (current) use of anticoagulants: Secondary | ICD-10-CM | POA: Diagnosis not present

## 2020-08-29 DIAGNOSIS — Z808 Family history of malignant neoplasm of other organs or systems: Secondary | ICD-10-CM | POA: Diagnosis not present

## 2020-08-29 DIAGNOSIS — R200 Anesthesia of skin: Secondary | ICD-10-CM | POA: Insufficient documentation

## 2020-08-29 DIAGNOSIS — Z8249 Family history of ischemic heart disease and other diseases of the circulatory system: Secondary | ICD-10-CM | POA: Diagnosis not present

## 2020-08-29 DIAGNOSIS — Z8379 Family history of other diseases of the digestive system: Secondary | ICD-10-CM | POA: Insufficient documentation

## 2020-08-29 DIAGNOSIS — Z5111 Encounter for antineoplastic chemotherapy: Secondary | ICD-10-CM | POA: Diagnosis present

## 2020-08-29 DIAGNOSIS — Z79899 Other long term (current) drug therapy: Secondary | ICD-10-CM | POA: Insufficient documentation

## 2020-08-29 DIAGNOSIS — Z7952 Long term (current) use of systemic steroids: Secondary | ICD-10-CM | POA: Diagnosis not present

## 2020-08-29 LAB — CBC WITH DIFFERENTIAL/PLATELET
Abs Immature Granulocytes: 0.04 10*3/uL (ref 0.00–0.07)
Basophils Absolute: 0 10*3/uL (ref 0.0–0.1)
Basophils Relative: 0 %
Eosinophils Absolute: 0.1 10*3/uL (ref 0.0–0.5)
Eosinophils Relative: 1 %
HCT: 34.3 % — ABNORMAL LOW (ref 36.0–46.0)
Hemoglobin: 11.5 g/dL — ABNORMAL LOW (ref 12.0–15.0)
Immature Granulocytes: 1 %
Lymphocytes Relative: 16 %
Lymphs Abs: 1.3 10*3/uL (ref 0.7–4.0)
MCH: 33.7 pg (ref 26.0–34.0)
MCHC: 33.5 g/dL (ref 30.0–36.0)
MCV: 100.6 fL — ABNORMAL HIGH (ref 80.0–100.0)
Monocytes Absolute: 0.8 10*3/uL (ref 0.1–1.0)
Monocytes Relative: 11 %
Neutro Abs: 5.5 10*3/uL (ref 1.7–7.7)
Neutrophils Relative %: 71 %
Platelets: 280 10*3/uL (ref 150–400)
RBC: 3.41 MIL/uL — ABNORMAL LOW (ref 3.87–5.11)
RDW: 16.3 % — ABNORMAL HIGH (ref 11.5–15.5)
WBC: 7.7 10*3/uL (ref 4.0–10.5)
nRBC: 0 % (ref 0.0–0.2)

## 2020-08-29 LAB — COMPREHENSIVE METABOLIC PANEL
ALT: 20 U/L (ref 0–44)
AST: 24 U/L (ref 15–41)
Albumin: 4.4 g/dL (ref 3.5–5.0)
Alkaline Phosphatase: 73 U/L (ref 38–126)
Anion gap: 10 (ref 5–15)
BUN: 12 mg/dL (ref 8–23)
CO2: 23 mmol/L (ref 22–32)
Calcium: 9.5 mg/dL (ref 8.9–10.3)
Chloride: 101 mmol/L (ref 98–111)
Creatinine, Ser: 0.52 mg/dL (ref 0.44–1.00)
GFR, Estimated: >60 mL/min (ref >60)
Glucose, Bld: 104 mg/dL — ABNORMAL HIGH (ref 70–99)
Potassium: 3.5 mmol/L (ref 3.5–5.1)
Sodium: 134 mmol/L — ABNORMAL LOW (ref 135–145)
Total Bilirubin: 0.7 mg/dL (ref 0.3–1.2)
Total Protein: 7.3 g/dL (ref 6.5–8.1)

## 2020-08-29 LAB — MAGNESIUM: Magnesium: 1.9 mg/dL (ref 1.7–2.4)

## 2020-08-29 MED ORDER — PALONOSETRON HCL INJECTION 0.25 MG/5ML
0.2500 mg | Freq: Once | INTRAVENOUS | Status: AC
  Administered 2020-08-29: 0.25 mg via INTRAVENOUS
  Filled 2020-08-29: qty 5

## 2020-08-29 MED ORDER — SODIUM CHLORIDE 0.9 % IV SOLN: Freq: Once | INTRAVENOUS | Status: AC

## 2020-08-29 MED ORDER — SODIUM CHLORIDE 0.9 % IV SOLN
10.0000 mg | Freq: Once | INTRAVENOUS | Status: AC
  Administered 2020-08-29: 10 mg via INTRAVENOUS
  Filled 2020-08-29: qty 10

## 2020-08-29 MED ORDER — SODIUM CHLORIDE 0.9% FLUSH
10.0000 mL | INTRAVENOUS | Status: DC | PRN
  Administered 2020-08-29: 10 mL

## 2020-08-29 MED ORDER — DIPHENHYDRAMINE HCL 50 MG/ML IJ SOLN
25.0000 mg | Freq: Once | INTRAMUSCULAR | Status: AC
  Administered 2020-08-29: 25 mg via INTRAVENOUS
  Filled 2020-08-29: qty 1

## 2020-08-29 MED ORDER — SODIUM CHLORIDE 0.9 % IV SOLN
150.0000 mg | Freq: Once | INTRAVENOUS | Status: AC
  Administered 2020-08-29: 150 mg via INTRAVENOUS
  Filled 2020-08-29: qty 5

## 2020-08-29 MED ORDER — HEPARIN SOD (PORK) LOCK FLUSH 100 UNIT/ML IV SOLN
500.0000 [IU] | Freq: Once | INTRAVENOUS | Status: AC | PRN
  Administered 2020-08-29: 500 [IU]

## 2020-08-29 MED ORDER — FAMOTIDINE IN NACL 20-0.9 MG/50ML-% IV SOLN
20.0000 mg | Freq: Once | INTRAVENOUS | Status: AC
  Administered 2020-08-29: 20 mg via INTRAVENOUS
  Filled 2020-08-29: qty 50

## 2020-08-29 MED ORDER — CARBOPLATIN CHEMO INJECTION 600 MG/60ML
500.0000 mg | Freq: Once | INTRAVENOUS | Status: AC
Start: 2020-08-29 — End: 2020-08-29
  Administered 2020-08-29: 500 mg via INTRAVENOUS
  Filled 2020-08-29: qty 50

## 2020-08-29 MED ORDER — SODIUM CHLORIDE 0.9 % IV SOLN
140.0000 mg/m2 | Freq: Once | INTRAVENOUS | Status: AC
  Administered 2020-08-29: 246 mg via INTRAVENOUS
  Filled 2020-08-29: qty 41

## 2020-08-29 NOTE — Progress Notes (Signed)
Patient was assessed by Dr. Katragadda and labs have been reviewed. Treatment pending labs. Primary RN and pharmacy aware.   

## 2020-08-29 NOTE — Progress Notes (Signed)
[place], [place] [address]   CLINIC:  Medical Oncology/Hematology  PCP:  Manon Hilding, MD [address] [place] [address] [phone]   REASON FOR VISIT:  Follow-up for right ovarian cancer  PRIOR THERAPY: TAH-BSO on 05/03/2020  NGS Results: Not done  CURRENT THERAPY: Carboplatin, paclitaxel & Aloxi every 3 weeks  BRIEF ONCOLOGIC HISTORY:  Oncology History  Right ovarian epithelial cancer ([place])  05/03/2020 Initial Diagnosis   Right ovarian epithelial cancer ([place])   05/03/2020 Cancer Staging   Staging form: Ovary, Fallopian Tube, and Primary Peritoneal Carcinoma, AJCC 8th Edition - Clinical stage from 05/03/2020: FIGO Stage II, calculated as Stage Unknown (cT2b, cNX, cM0) - Signed by Everitt Amber, MD on 05/24/2020   06/06/2020 -  Chemotherapy    Patient is on Treatment Plan: OVARIAN CARBOPLATIN (AUC 6) / PACLITAXEL (175) Q21D X 6 CYCLES       Genetic Testing   Negative genetic testing. No pathogenic variants identified on the [employer]. The report date is 08/10/2020.  The CancerNext gene panel offered by [employer] includes sequencing and rearrangement analysis for the following 36 genes: APC*, ATM*, AXIN2, BARD1, BMPR1A, BRCA1*, BRCA2*, BRIP1*, CDH1*, CDK4, CDKN2A, CHEK2*, DICER1, MLH1*, MSH2*, MSH3, MSH6*, MUTYH*, NBN, NF1*, NTHL1, PALB2*, PMS2*, PTEN*, RAD51C*, RAD51D*, RECQL, SMAD4, SMARCA4, STK11 and TP53* (sequencing and deletion/duplication); HOXB13, POLD1 and POLE (sequencing only); EPCAM and GREM1 (deletion/duplication only).   Somatic genes analyzed through TumorNext-HRD: ATM, BARD1, BRCA1, BRCA2, BRIP1, CHEK2, MRE11A, NBN, PALB2, RAD51C, RAD51D.     CANCER STAGING: Cancer Staging Right ovarian epithelial cancer [facility]) Staging form: Ovary, Fallopian Tube, and Primary Peritoneal Carcinoma, AJCC 8th Edition - Clinical stage from 05/03/2020: FIGO Stage II, calculated as Stage Unknown (cT2b,  cNX, cM0) - Signed by Everitt Amber, MD on 05/24/2020   INTERVAL HISTORY:  Ms. KATHI DOHN, a 75 y.o. female, returns for routine follow-up and consideration for next cycle of chemotherapy. Esthela was last seen on 08/08/2020.  Due for cycle #5 of carboplatin, paclitaxel and Aloxi today.   Today she is accompanied by her daughter. Overall, she tells me she has been feeling pretty well. She is taking gabapentin TID for the numbness and tingling in her feet and soreness in her fingertips, but denies pain. She denies having mouth sores or N/V/D and her appetite is excellent; she is drinking plenty of water daily.  Overall, she feels ready for next cycle of chemo today.    REVIEW OF SYSTEMS:  Review of Systems  Constitutional: Positive for fatigue (90%). Negative for appetite change.  HENT:   Negative for mouth sores.   Gastrointestinal: Negative for diarrhea, nausea and vomiting.  Neurological: Positive for numbness (hands & feet).  All other systems reviewed and are negative.   PAST MEDICAL/SURGICAL HISTORY:  Past Medical History:  Diagnosis Date  . A-fib ([place])   . Anxiety   . Arthritis   . Asthma    hx of   . Cancer ([place])    ovarian  . Dyspnea    with exertion   . H/O seasonal allergies   . Heart murmur    as aa child   . Hypercholesteremia   . Hypertension   . Spinal stenosis    Past Surgical History:  Procedure Laterality Date  . ABDOMINAL HYSTERECTOMY    . BIOPSY  05/08/2020   Procedure: BIOPSY;  Surgeon: Harvel Quale, MD;  [facility];  Service: Gastroenterology;;  .  BREAST SURGERY    . CARDIAC CATHETERIZATION    . CHOLECYSTECTOMY    . COLONOSCOPY N/A 08/14/2015   Procedure: COLONOSCOPY;  Surgeon: Aviva Signs, MD;  [facility];  Service: Gastroenterology;  Laterality: N/A;  . ESOPHAGOGASTRODUODENOSCOPY (EGD) WITH PROPOFOL N/A 05/08/2020   Procedure: ESOPHAGOGASTRODUODENOSCOPY (EGD) WITH PROPOFOL;  Surgeon: Harvel Quale, MD;  [facility];  Service: Gastroenterology;  Laterality: N/A;  . ESOPHAGOGASTRODUODENOSCOPY (EGD) WITH PROPOFOL N/A 08/07/2020   Procedure: ESOPHAGOGASTRODUODENOSCOPY (EGD) WITH PROPOFOL;  Surgeon: Harvel Quale, MD;  [facility];  Service: Gastroenterology;  Laterality: N/A;  7:30  . IR IMAGING GUIDED PORT INSERTION  06/04/2020  . ROBOTIC ASSISTED TOTAL HYSTERECTOMY WITH BILATERAL SALPINGO OOPHERECTOMY N/A 05/03/2020   Procedure: XI ROBOTIC ASSISTED TOTAL HYSTERECTOMY WITH BILATERAL SALPINGO OOPHORECTOMY, OMENTECTOMY,RADICAL TUMOR DEBULKING, RIGID PROSTOSCOPY;  Surgeon: Everitt Amber, MD;  Location: [facility];  Service: Gynecology;  Laterality: N/A;  . THROMBECTOMY FEMORAL ARTERY Right 12/17/2013   Procedure: REPAIR OF RIGHT FEMORAL ARTERY PSEUDOANEURYSM;  Surgeon: Elam Dutch, MD;  Location: [facility];  Service: Vascular;  Laterality: Right;    SOCIAL HISTORY:  Social History   Socioeconomic History  . Marital status: Married    Spouse name: Not on file  . Number of children: 4  . Years of education: Not on file  . Highest education level: Not on file  Occupational History  . Occupation: retired Marine scientist  Tobacco Use  . Smoking status: Never Smoker  . Smokeless tobacco: Never Used  Vaping Use  . Vaping Use: Never used  Substance and Sexual Activity  . Alcohol use: No  . Drug use: No  . Sexual activity: Not Currently  Other Topics Concern  . Not on file  Social History Narrative  . Not on file   Social Determinants of Health   Financial Resource Strain: Low Risk   . Difficulty of Paying Living Expenses: Not hard at all  Food Insecurity: No Food Insecurity  . Worried About Charity fundraiser in the Last Year: Never true  . Ran Out of Food in the Last Year: Never true  Transportation Needs: No Transportation Needs  . Lack of Transportation (Medical): No  . Lack of Transportation (Non-Medical): No  Physical Activity: Insufficiently  Active  . Days of Exercise per Week: 7 days  . Minutes of Exercise per Session: 10 min  Stress: No Stress Concern Present  . Feeling of Stress : Not at all  Social Connections: Moderately Integrated  . Frequency of Communication with Friends and Family: More than three times a week  . Frequency of Social Gatherings with Friends and Family: More than three times a week  . Attends Religious Services: More than 4 times per year  . Active Member of Clubs or Organizations: No  . Attends Archivist Meetings: Never  . Marital Status: Married  Human resources officer Violence: Not At Risk  . Fear of Current or Ex-Partner: No  . Emotionally Abused: No  . Physically Abused: No  . Sexually Abused: No    FAMILY HISTORY:  Family History  Problem Relation Age of Onset  . Hypertension Mother   . Heart failure Mother   . Asthma Mother   . Ulcers Mother   . Benign prostatic hyperplasia Father   . Ulcers Father   . Esophageal varices Father   . Atrial fibrillation Sister   . Atrial fibrillation Brother   . Heart disease Brother   . Melanoma Maternal  Aunt   . Colon cancer Neg Hx     CURRENT MEDICATIONS:  Current Outpatient Medications  Medication Sig Dispense Refill  . acetaminophen (TYLENOL) 500 MG tablet Take 1,000 mg by mouth 3 (three) times daily.    Marland Kitchen albuterol (VENTOLIN HFA) 108 (90 Base) MCG/ACT inhaler Inhale 2 puffs into the lungs every 4 (four) hours as needed for wheezing or shortness of breath. 18 g 2  . amLODipine (NORVASC) 10 MG tablet Take 1 tablet (10 mg total) by mouth daily with lunch. For BP 30 tablet 3  . Calcium Carb-Cholecalciferol (CALCIUM 600/VITAMIN D PO) Take 600 mg by mouth in the morning and at bedtime.    Marland Kitchen CARBOPLATIN IV Inject 1 application into the vein every 21 ( twenty-one) days.    . cetirizine (ZYRTEC) 10 MG tablet Take 10 mg by mouth daily.    . Cholecalciferol (VITAMIN D) 125 MCG (5000 UT) CAPS Take 5,000 Units by mouth daily.    . Coenzyme Q10  (COQ10) 100 MG CAPS Take 100 mg by mouth at bedtime.     . cyclobenzaprine (FLEXERIL) 10 MG tablet Take 5 mg by mouth See admin instructions. Take 5 mg at night, may take a second 5 mg dose during the day as needed for muscle spasms    . dexamethasone (DECADRON) 4 MG tablet Take 1 tablet (4 mg total) by mouth daily. Start the day after carboplatin chemotherapy for 3 days. (Patient taking differently: Take 4 mg by mouth See admin instructions. Start the day after carboplatin chemotherapy for 3 days.) 20 tablet 0  . diltiazem (CARDIZEM) 30 MG tablet Take 1 tablet every 4 hours AS NEEDED for AFIB heart rate >100 (Patient taking differently: Take 30 mg by mouth every 4 (four) hours as needed (AFIB heart rate > 100).) 30 tablet 3  . fluticasone (FLONASE) 50 MCG/ACT nasal spray Place 1 spray into both nostrils daily.     Marland Kitchen gabapentin (NEURONTIN) 300 MG capsule Take 1 capsule (300 mg total) by mouth 3 (three) times daily. 90 capsule 2  . lidocaine-prilocaine (EMLA) cream Apply to affected area once 30 g 3  . lisinopril (ZESTRIL) 30 MG tablet Take 1 tablet (30 mg total) by mouth in the morning. Okay to restart around Monday, 05/14/2020 if blood pressure stable (Patient taking differently: Take 30 mg by mouth in the morning.) 30 tablet 3  . Magnesium 100 MG TABS Take 50 mg by mouth at bedtime.    . melatonin 5 MG TABS Take 2.5 mg by mouth at bedtime as needed (Sleep).    . metoprolol succinate (TOPROL-XL) 100 MG 24 hr tablet Take 100 mg by mouth in the morning. Take with or immediately following a meal.    . Multiple Vitamins-Minerals (CENTRUM SILVER ADULT 50+ PO) Take 1 tablet by mouth daily.    . Omega-3 Fatty Acids (FISH OIL) 1000 MG CAPS Take 1,000 mg by mouth daily.    . ondansetron (ZOFRAN) 4 MG tablet Take 1 tablet (4 mg total) by mouth every 6 (six) hours as needed for nausea. 20 tablet 0  . PACLITAXEL IV Inject 1 application into the vein every 21 ( twenty-one) days.    . pantoprazole (PROTONIX) 40 MG  tablet Take 1 tablet (40 mg total) by mouth daily. 90 tablet 3  . polyethylene glycol powder (GLYCOLAX/MIRALAX) 17 GM/SCOOP powder Take 17 g by mouth daily as needed for mild constipation or moderate constipation (With lunch).    . prochlorperazine (COMPAZINE) 10 MG tablet Take  1 tablet (10 mg total) by mouth every 6 (six) hours as needed (Nausea or vomiting). 30 tablet 1  . senna (SENOKOT) 8.6 MG tablet Take 1 tablet by mouth daily as needed for constipation.    . simvastatin (ZOCOR) 40 MG tablet Take 0.5 tablets (20 mg total) by mouth daily. (Patient taking differently: Take 40 mg by mouth at bedtime.) 30 tablet 2  . sodium chloride (OCEAN) 0.65 % SOLN nasal spray Place 1 spray into both nostrils See admin instructions. Use 1 spray in each nostril in the morning may use a second dose at night as needed for congestion    . tetrahydrozoline-zinc (VISINE-AC) 0.05-0.25 % ophthalmic solution Place 1 drop into both eyes daily.    Marland Kitchen triamcinolone (KENALOG) 0.1 % Apply 1 application topically daily as needed (irritation).    . vitamin C (ASCORBIC ACID) 500 MG tablet Take 500 mg by mouth daily.    . Vitamins A & D (VITAMIN A & D) ointment Apply 1 application topically daily as needed for dry skin (irritation).     No current facility-administered medications for this visit.    ALLERGIES:  Allergies  Allergen Reactions  . Morphine And Related Nausea And Vomiting  . Zofran [Ondansetron]     Causes minor constipation, tolerates low doses      PHYSICAL EXAM:  Performance status (ECOG): 1 - Symptomatic but completely ambulatory  Vitals:   08/29/20 0841  BP: (!) 145/66  Pulse: 82  Resp: 17  Temp: (!) 97 F (36.1 C)  SpO2: 98%   Wt Readings from Last 3 Encounters:  08/29/20 165 lb 9 oz (75.1 kg)  08/10/20 167 lb 1.7 oz (75.8 kg)  08/08/20 167 lb 9.6 oz (76 kg)   Physical Exam Vitals reviewed.  Constitutional:      Appearance: Normal appearance. She is obese.  Cardiovascular:     Rate  and Rhythm: Normal rate and regular rhythm.     Pulses: Normal pulses.     Heart sounds: Normal heart sounds.  Pulmonary:     Effort: Pulmonary effort is normal.     Breath sounds: Normal breath sounds.  Chest:     Comments: Port-a-Cath in R chest Abdominal:     Palpations: Abdomen is soft. There is no mass.     Tenderness: There is no abdominal tenderness.     Hernia: No hernia is present.  Musculoskeletal:     Right lower leg: No edema.     Left lower leg: No edema.  Neurological:     General: No focal deficit present.     Mental Status: She is alert and oriented to person, place, and time.  Psychiatric:        Mood and Affect: Mood normal.        Behavior: Behavior normal.     LABORATORY DATA:  I have reviewed the labs as listed.  CBC Latest Ref Rng & Units 08/29/2020 08/08/2020 07/18/2020  WBC 4.0 - 10.5 K/uL 7.7 4.5 5.6  Hemoglobin 12.0 - 15.0 g/dL 11.5(L) 11.4(L) 11.2(L)  Hematocrit 36.0 - 46.0 % 34.3(L) 34.0(L) 34.2(L)  Platelets 150 - 400 K/uL 280 257 415(H)   CMP Latest Ref Rng & Units 08/08/2020 07/18/2020 06/27/2020  Glucose 70 - 99 mg/dL 86 79 91  BUN 8 - 23 mg/dL 15 14 17   Creatinine 0.44 - 1.00 mg/dL 0.52 0.50 0.55  Sodium 135 - 145 mmol/L 134(L) 132(L) 133(L)  Potassium 3.5 - 5.1 mmol/L 3.7 4.1 3.8  Chloride 98 -  111 mmol/L 103 101 100  CO2 22 - 32 mmol/L 24 24 24   Calcium 8.9 - 10.3 mg/dL 9.4 9.5 9.1  Total Protein 6.5 - 8.1 g/dL 7.0 7.2 7.1  Total Bilirubin 0.3 - 1.2 mg/dL 0.6 0.4 0.5  Alkaline Phos 38 - 126 U/L 60 63 64  AST 15 - 41 U/L 23 21 23   ALT 0 - 44 U/L 18 20 23     DIAGNOSTIC IMAGING:  I have independently reviewed the scans and discussed with the patient. No results found.   ASSESSMENT:  1.Stage II clear cell right ovarian cancer: -Status post robotic assisted total hysterectomy, BSO, omentectomy and staging on 05/03/2020. -[id number] on 04/20/2020 -Evaluated by Dr. Denman George on 05/24/2020. -6 cycles of carboplatin and paclitaxel was  recommended. -Somatic and germline mutation testing was negative. -Cycle 1 of carboplatin and paclitaxel started on 06/06/2020.  2. Atrial fibrillation: -Was on Eliquis. -She had postoperative GI bleed when Eliquis was discontinued.   PLAN:  1.Stage II clear-cell right ovarian cancer: -She has tolerated cycle 4 very well. -No GI symptoms reported. -CA-125 has improved 46.51 08/08/2020. -Reviewed labs which showed normal LFTs and CBC. -Proceed with cycle 5 today with paclitaxel 150 mg/M2.  RTC 3 weeks for follow-up.  Plan to repeat scans after cycle 6.  2. Peripheral neuropathy: -Numbness is stable.  Continue gabapentin 300 mg 3 times daily.  3. Genetic testing: -Somatic and germline mutation testing was negative.  Not a candidate for Pap inhibitor maintenance.   Orders placed this encounter:  No orders of the defined types were placed in this encounter.    Derek Jack, MD [place] [phone]   I, Milinda Antis, am acting as a scribe for Dr. Sanda Linger.  I, Derek Jack MD, have reviewed the above documentation for accuracy and completeness, and I agree with the above.

## 2020-08-29 NOTE — Patient Instructions (Signed)
[facility] at [facility] Discharge Instructions  You were seen today by Dr. Katragadda. He went over your recent results. You received your treatment today. Dr. Katragadda will see you back in 3 weeks for labs and follow up.   Thank you for choosing [facility] at [facility] to provide your oncology and hematology care.  To afford each patient quality time with our provider, please arrive at least 15 minutes before your scheduled appointment time.   If you have a lab appointment with the Cancer Center please come in thru the Main Entrance and check in at the main information desk  You need to re-schedule your appointment should you arrive 10 or more minutes late.  We strive to give you quality time with our providers, and arriving late affects you and other patients whose appointments are after yours.  Also, if you no show three or more times for appointments you may be dismissed from the clinic at the providers discretion.     Again, thank you for choosing [facility].  Our hope is that these requests will decrease the amount of time that you wait before being seen by our physicians.       _____________________________________________________________  Should you have questions after your visit to [facility], please contact our office at [phone] between the hours of 8:00 a.m. and 4:30 p.m.  Voicemails left after 4:00 p.m. will not be returned until the following business day.  For prescription refill requests, have your pharmacy contact our office and allow 72 hours.    Cancer Center Support Programs:   > Cancer Support Group  2nd Tuesday of the month 1pm-2pm, Journey Room    

## 2020-08-29 NOTE — Patient Instructions (Signed)
[facility] Discharge Instructions for Patients Receiving Chemotherapy  Today you received the following chemotherapy agents taxol and carboplatin.  Please return to the clinic as scheduled.   To help prevent nausea and vomiting after your treatment, we encourage you to take your nausea medication    If you develop nausea and vomiting that is not controlled by your nausea medication, call the clinic.   BELOW ARE SYMPTOMS THAT SHOULD BE REPORTED IMMEDIATELY:  *FEVER GREATER THAN 100.5 F  *CHILLS WITH OR WITHOUT FEVER  NAUSEA AND VOMITING THAT IS NOT CONTROLLED WITH YOUR NAUSEA MEDICATION  *UNUSUAL SHORTNESS OF BREATH  *UNUSUAL BRUISING OR BLEEDING  TENDERNESS IN MOUTH AND THROAT WITH OR WITHOUT PRESENCE OF ULCERS  *URINARY PROBLEMS  *BOWEL PROBLEMS  UNUSUAL RASH Items with * indicate a potential emergency and should be followed up as soon as possible.  Feel free to call the clinic should you have any questions or concerns. The clinic phone number is [phone].  Please show the [place] at check-in to the Emergency Department and triage nurse.

## 2020-08-29 NOTE — Progress Notes (Signed)
Pt is here for C5D1 carboplatin and taxol.  Okay for treatment today.  Tolerated treatment well today without incidence.  Vital signs stable prior to discharge.  Discharged in stable condition via wheelchair.

## 2020-08-31 ENCOUNTER — Other Ambulatory Visit: Payer: Self-pay

## 2020-08-31 ENCOUNTER — Inpatient Hospital Stay (HOSPITAL_COMMUNITY): Payer: Medicare Other

## 2020-08-31 VITALS — BP 131/71 | HR 74 | Temp 98.0°F | Resp 18

## 2020-08-31 DIAGNOSIS — Z5111 Encounter for antineoplastic chemotherapy: Secondary | ICD-10-CM | POA: Diagnosis not present

## 2020-08-31 DIAGNOSIS — C561 Malignant neoplasm of right ovary: Secondary | ICD-10-CM

## 2020-08-31 MED ORDER — PEGFILGRASTIM-CBQV 6 MG/0.6ML ~~LOC~~ SOSY
PREFILLED_SYRINGE | SUBCUTANEOUS | Status: AC
  Filled 2020-08-31: qty 0.6

## 2020-08-31 MED ORDER — PEGFILGRASTIM-CBQV 6 MG/0.6ML ~~LOC~~ SOSY
6.0000 mg | PREFILLED_SYRINGE | Freq: Once | SUBCUTANEOUS | Status: AC
  Administered 2020-08-31: 6 mg via SUBCUTANEOUS

## 2020-08-31 NOTE — Progress Notes (Signed)
Patient presents today for Udenyca injection.  Stable during injection.  Injection site WNL.  VSS.   No complaints at this time.  Discharge from clinic ambulatory in stable condition.  Alert and oriented X 3.  Follow up with [facility] as scheduled.

## 2020-09-04 ENCOUNTER — Telehealth (INDEPENDENT_AMBULATORY_CARE_PROVIDER_SITE_OTHER): Payer: Self-pay

## 2020-09-04 NOTE — Telephone Encounter (Signed)
Hi, yes aspirin 81 mg qday would be ok. Thanks

## 2020-09-04 NOTE — Telephone Encounter (Signed)
Patient called today stating she had been in the hospital in November 2021 with a bleeding ulcer, which she states has healed. She had been on Eliquis prior to this bleed and was taken off of it since the bleed around October or November 2021 per patient. She states she saw her pcp Dr. Quintin Alto recently and he would like for her to start taking an 81 mg enteric aspirin due to Afib and family history of strokes. Will this be ok for the patient to start taking once per day? Please advise.

## 2020-09-04 NOTE — Telephone Encounter (Signed)
Patient aware of all.

## 2020-09-04 NOTE — Telephone Encounter (Signed)
Tried calling left a vm to return call to the office.

## 2020-09-19 ENCOUNTER — Ambulatory Visit (HOSPITAL_COMMUNITY): Payer: Medicare Other

## 2020-09-19 ENCOUNTER — Other Ambulatory Visit: Payer: Self-pay

## 2020-09-19 ENCOUNTER — Ambulatory Visit (HOSPITAL_COMMUNITY): Payer: Medicare Other | Admitting: Hematology

## 2020-09-19 ENCOUNTER — Other Ambulatory Visit (HOSPITAL_COMMUNITY): Payer: Medicare Other

## 2020-09-19 ENCOUNTER — Inpatient Hospital Stay (HOSPITAL_COMMUNITY): Payer: Medicare Other | Admitting: Hematology

## 2020-09-19 ENCOUNTER — Inpatient Hospital Stay (HOSPITAL_COMMUNITY): Payer: Medicare Other

## 2020-09-19 VITALS — BP 158/85 | HR 83 | Temp 97.2°F | Resp 17

## 2020-09-19 VITALS — BP 169/83 | HR 82 | Temp 97.0°F | Resp 18 | Wt 167.8 lb

## 2020-09-19 DIAGNOSIS — C561 Malignant neoplasm of right ovary: Secondary | ICD-10-CM | POA: Diagnosis not present

## 2020-09-19 DIAGNOSIS — Z5111 Encounter for antineoplastic chemotherapy: Secondary | ICD-10-CM | POA: Diagnosis not present

## 2020-09-19 LAB — CBC WITH DIFFERENTIAL/PLATELET
Abs Immature Granulocytes: 0.03 10*3/uL (ref 0.00–0.07)
Basophils Absolute: 0 10*3/uL (ref 0.0–0.1)
Basophils Relative: 0 %
Eosinophils Absolute: 0 10*3/uL (ref 0.0–0.5)
Eosinophils Relative: 1 %
HCT: 33.8 % — ABNORMAL LOW (ref 36.0–46.0)
Hemoglobin: 11.3 g/dL — ABNORMAL LOW (ref 12.0–15.0)
Immature Granulocytes: 1 %
Lymphocytes Relative: 22 %
Lymphs Abs: 1.4 10*3/uL (ref 0.7–4.0)
MCH: 34.3 pg — ABNORMAL HIGH (ref 26.0–34.0)
MCHC: 33.4 g/dL (ref 30.0–36.0)
MCV: 102.7 fL — ABNORMAL HIGH (ref 80.0–100.0)
Monocytes Absolute: 1 10*3/uL (ref 0.1–1.0)
Monocytes Relative: 16 %
Neutro Abs: 3.8 10*3/uL (ref 1.7–7.7)
Neutrophils Relative %: 60 %
Platelets: 248 10*3/uL (ref 150–400)
RBC: 3.29 MIL/uL — ABNORMAL LOW (ref 3.87–5.11)
RDW: 17.1 % — ABNORMAL HIGH (ref 11.5–15.5)
WBC: 6.3 10*3/uL (ref 4.0–10.5)
nRBC: 0 % (ref 0.0–0.2)

## 2020-09-19 LAB — COMPREHENSIVE METABOLIC PANEL
ALT: 18 U/L (ref 0–44)
AST: 21 U/L (ref 15–41)
Albumin: 4.3 g/dL (ref 3.5–5.0)
Alkaline Phosphatase: 67 U/L (ref 38–126)
Anion gap: 10 (ref 5–15)
BUN: 17 mg/dL (ref 8–23)
CO2: 24 mmol/L (ref 22–32)
Calcium: 9.6 mg/dL (ref 8.9–10.3)
Chloride: 101 mmol/L (ref 98–111)
Creatinine, Ser: 0.55 mg/dL (ref 0.44–1.00)
GFR, Estimated: >60 mL/min (ref >60)
Glucose, Bld: 87 mg/dL (ref 70–99)
Potassium: 3.7 mmol/L (ref 3.5–5.1)
Sodium: 135 mmol/L (ref 135–145)
Total Bilirubin: 0.5 mg/dL (ref 0.3–1.2)
Total Protein: 7.1 g/dL (ref 6.5–8.1)

## 2020-09-19 LAB — MAGNESIUM: Magnesium: 1.8 mg/dL (ref 1.7–2.4)

## 2020-09-19 MED ORDER — DIPHENHYDRAMINE HCL 50 MG/ML IJ SOLN
25.0000 mg | Freq: Once | INTRAMUSCULAR | Status: AC
  Administered 2020-09-19: 25 mg via INTRAVENOUS

## 2020-09-19 MED ORDER — PALONOSETRON HCL INJECTION 0.25 MG/5ML
INTRAVENOUS | Status: AC
  Filled 2020-09-19: qty 5

## 2020-09-19 MED ORDER — FAMOTIDINE IN NACL 20-0.9 MG/50ML-% IV SOLN
INTRAVENOUS | Status: AC
  Filled 2020-09-19: qty 50

## 2020-09-19 MED ORDER — DIPHENHYDRAMINE HCL 50 MG/ML IJ SOLN
INTRAMUSCULAR | Status: AC
  Filled 2020-09-19: qty 1

## 2020-09-19 MED ORDER — SODIUM CHLORIDE 0.9 % IV SOLN
150.0000 mg | Freq: Once | INTRAVENOUS | Status: AC
  Administered 2020-09-19: 150 mg via INTRAVENOUS
  Filled 2020-09-19: qty 75

## 2020-09-19 MED ORDER — SODIUM CHLORIDE 0.9 % IV SOLN: Freq: Once | INTRAVENOUS | Status: AC

## 2020-09-19 MED ORDER — PALONOSETRON HCL INJECTION 0.25 MG/5ML
0.2500 mg | Freq: Once | INTRAVENOUS | Status: AC
  Administered 2020-09-19: 0.25 mg via INTRAVENOUS

## 2020-09-19 MED ORDER — HEPARIN SOD (PORK) LOCK FLUSH 100 UNIT/ML IV SOLN
500.0000 [IU] | Freq: Once | INTRAVENOUS | Status: AC | PRN
  Administered 2020-09-19: 500 [IU]

## 2020-09-19 MED ORDER — SODIUM CHLORIDE 0.9% FLUSH
10.0000 mL | INTRAVENOUS | Status: DC | PRN
  Administered 2020-09-19: 10 mL

## 2020-09-19 MED ORDER — SODIUM CHLORIDE 0.9 % IV SOLN
140.0000 mg/m2 | Freq: Once | INTRAVENOUS | Status: AC
  Administered 2020-09-19: 246 mg via INTRAVENOUS
  Filled 2020-09-19: qty 41

## 2020-09-19 MED ORDER — SODIUM CHLORIDE 0.9 % IV SOLN
10.0000 mg | Freq: Once | INTRAVENOUS | Status: AC
  Administered 2020-09-19: 10 mg via INTRAVENOUS
  Filled 2020-09-19: qty 10

## 2020-09-19 MED ORDER — FAMOTIDINE IN NACL 20-0.9 MG/50ML-% IV SOLN
20.0000 mg | Freq: Once | INTRAVENOUS | Status: AC
  Administered 2020-09-19: 20 mg via INTRAVENOUS

## 2020-09-19 MED ORDER — SODIUM CHLORIDE 0.9 % IV SOLN
497.4000 mg | Freq: Once | INTRAVENOUS | Status: AC
  Administered 2020-09-19: 500 mg via INTRAVENOUS
  Filled 2020-09-19: qty 50

## 2020-09-19 NOTE — Patient Instructions (Signed)
[facility] at [facility] Discharge Instructions  You were seen today by Dr. Delton Coombes. He went over your recent results. You received your final treatment today. You will be scheduled to have a CT scan of your abdomen done before your next visit. Dr. Delton Coombes will see you back in 1 month for labs and follow up.   Thank you for choosing [place] at [facility] to provide your oncology and hematology care.  To afford each patient quality time with our provider, please arrive at least 15 minutes before your scheduled appointment time.   If you have a lab appointment with the [facility] please come in thru the Main Entrance and check in at the main information desk  You need to re-schedule your appointment should you arrive 10 or more minutes late.  We strive to give you quality time with our providers, and arriving late affects you and other patients whose appointments are after yours.  Also, if you no show three or more times for appointments you may be dismissed from the clinic at the providers discretion.     Again, thank you for choosing [facility].  Our hope is that these requests will decrease the amount of time that you wait before being seen by our physicians.       _____________________________________________________________  Should you have questions after your visit to [facility], please contact our office at [phone] between the hours of 8:00 a.m. and 4:30 p.m.  Voicemails left after 4:00 p.m. will not be returned until the following business day.  For prescription refill requests, have your pharmacy contact our office and allow 72 hours.    Cancer Center Support Programs:   > Cancer Support Group  2nd Tuesday of the month 1pm-2pm, Journey Room

## 2020-09-19 NOTE — Progress Notes (Signed)
Patient was assessed by Dr. Katragadda and labs have been reviewed.  Patient is okay to proceed with treatment today. Primary RN and pharmacy aware.   

## 2020-09-19 NOTE — Patient Instructions (Signed)
[facility] Discharge Instructions for Patients Receiving Chemotherapy  Today you received the following chemotherapy agents   To help prevent nausea and vomiting after your treatment, we encourage you to take your nausea medication   If you develop nausea and vomiting that is not controlled by your nausea medication, call the clinic.   BELOW ARE SYMPTOMS THAT SHOULD BE REPORTED IMMEDIATELY:  *FEVER GREATER THAN 100.5 F  *CHILLS WITH OR WITHOUT FEVER  NAUSEA AND VOMITING THAT IS NOT CONTROLLED WITH YOUR NAUSEA MEDICATION  *UNUSUAL SHORTNESS OF BREATH  *UNUSUAL BRUISING OR BLEEDING  TENDERNESS IN MOUTH AND THROAT WITH OR WITHOUT PRESENCE OF ULCERS  *URINARY PROBLEMS  *BOWEL PROBLEMS  UNUSUAL RASH Items with * indicate a potential emergency and should be followed up as soon as possible.  Feel free to call the clinic should you have any questions or concerns. The clinic phone number is [phone].  Please show the CHEMO ALERT CARD at check-in to the Emergency Department and triage nurse.   

## 2020-09-19 NOTE — Progress Notes (Signed)
[place], [place] [address]   CLINIC:  Medical Oncology/Hematology  PCP:  Manon Hilding, MD [address] [place] [address] [phone]   REASON FOR VISIT:  Follow-up for right ovarian cancer  PRIOR THERAPY: TAH-BSO on 05/03/2020  NGS Results: Not done  CURRENT THERAPY: Carboplatin, paclitaxel & Aloxi every 3 weeks  BRIEF ONCOLOGIC HISTORY:  Oncology History  Right ovarian epithelial cancer ([place])  05/03/2020 Initial Diagnosis   Right ovarian epithelial cancer ([place])   05/03/2020 Cancer Staging   Staging form: Ovary, Fallopian Tube, and Primary Peritoneal Carcinoma, AJCC 8th Edition - Clinical stage from 05/03/2020: FIGO Stage II, calculated as Stage Unknown (cT2b, cNX, cM0) - Signed by Everitt Amber, MD on 05/24/2020   06/06/2020 -  Chemotherapy    Patient is on Treatment Plan: OVARIAN CARBOPLATIN (AUC 6) / PACLITAXEL (175) Q21D X 6 CYCLES       Genetic Testing   Negative genetic testing. No pathogenic variants identified on the [employer]. The report date is 08/10/2020.  The CancerNext gene panel offered by [employer] includes sequencing and rearrangement analysis for the following 36 genes: APC*, ATM*, AXIN2, BARD1, BMPR1A, BRCA1*, BRCA2*, BRIP1*, CDH1*, CDK4, CDKN2A, CHEK2*, DICER1, MLH1*, MSH2*, MSH3, MSH6*, MUTYH*, NBN, NF1*, NTHL1, PALB2*, PMS2*, PTEN*, RAD51C*, RAD51D*, RECQL, SMAD4, SMARCA4, STK11 and TP53* (sequencing and deletion/duplication); HOXB13, POLD1 and POLE (sequencing only); EPCAM and GREM1 (deletion/duplication only).   Somatic genes analyzed through TumorNext-HRD: ATM, BARD1, BRCA1, BRCA2, BRIP1, CHEK2, MRE11A, NBN, PALB2, RAD51C, RAD51D.     CANCER STAGING: Cancer Staging Right ovarian epithelial cancer [facility]) Staging form: Ovary, Fallopian Tube, and Primary Peritoneal Carcinoma, AJCC 8th Edition - Clinical stage from 05/03/2020: FIGO Stage II, calculated as Stage Unknown (cT2b,  cNX, cM0) - Signed by Everitt Amber, MD on 05/24/2020   INTERVAL HISTORY:  [name], a 75 y.o. female, returns for routine follow-up and consideration for final cycle of chemotherapy. April Guerra was last seen on 08/29/2020.  Due for cycle #6 of carboplatin, paclitaxel and Aloxi today.   Today she is accompanied by her daughter. Overall, she tells me she has been feeling pretty well. She tolerated the previous treatment well and notes having tenderness in her fingernails on her hands and numbness in her fingertips, for which she takes ibuprofen TID and gabapentin TID. She reports having early satiety the first 2 days after chemo, but denies having N/V. She started ASA 81 last week and stopped Eliquis.  Overall, she feels ready for final cycle of chemo today.    REVIEW OF SYSTEMS:  Review of Systems  Constitutional: Positive for fatigue (75%). Negative for appetite change.  Gastrointestinal: Negative for nausea and vomiting.  Neurological: Positive for numbness (fingers & toes & pain in nail beds).  All other systems reviewed and are negative.   PAST MEDICAL/SURGICAL HISTORY:  Past Medical History:  Diagnosis Date  . A-fib ([place])   . Anxiety   . Arthritis   . Asthma    hx of   . Cancer ([place])    ovarian  . Dyspnea    with exertion   . H/O seasonal allergies   . Heart murmur    as aa child   . Hypercholesteremia   . Hypertension   . Spinal stenosis    Past Surgical History:  Procedure Laterality Date  . ABDOMINAL HYSTERECTOMY    . BIOPSY  05/08/2020   Procedure: BIOPSY;  Surgeon: Harvel Quale, MD;  [facility];  Service: Gastroenterology;;  . BREAST SURGERY    . CARDIAC CATHETERIZATION    . CHOLECYSTECTOMY    . COLONOSCOPY N/A 08/14/2015   Procedure: COLONOSCOPY;  Surgeon: Aviva Signs, MD;  [facility];  Service: Gastroenterology;  Laterality: N/A;  . ESOPHAGOGASTRODUODENOSCOPY (EGD) WITH PROPOFOL N/A 05/08/2020   Procedure:  ESOPHAGOGASTRODUODENOSCOPY (EGD) WITH PROPOFOL;  Surgeon: Harvel Quale, MD;  [facility];  Service: Gastroenterology;  Laterality: N/A;  . ESOPHAGOGASTRODUODENOSCOPY (EGD) WITH PROPOFOL N/A 08/07/2020   Procedure: ESOPHAGOGASTRODUODENOSCOPY (EGD) WITH PROPOFOL;  Surgeon: Harvel Quale, MD;  [facility];  Service: Gastroenterology;  Laterality: N/A;  7:30  . IR IMAGING GUIDED PORT INSERTION  06/04/2020  . ROBOTIC ASSISTED TOTAL HYSTERECTOMY WITH BILATERAL SALPINGO OOPHERECTOMY N/A 05/03/2020   Procedure: XI ROBOTIC ASSISTED TOTAL HYSTERECTOMY WITH BILATERAL SALPINGO OOPHORECTOMY, OMENTECTOMY,RADICAL TUMOR DEBULKING, RIGID PROSTOSCOPY;  Surgeon: Everitt Amber, MD;  Location: [facility];  Service: Gynecology;  Laterality: N/A;  . THROMBECTOMY FEMORAL ARTERY Right 12/17/2013   Procedure: REPAIR OF RIGHT FEMORAL ARTERY PSEUDOANEURYSM;  Surgeon: Elam Dutch, MD;  Location: [facility];  Service: Vascular;  Laterality: Right;    SOCIAL HISTORY:  Social History   Socioeconomic History  . Marital status: Married    Spouse name: Not on file  . Number of children: 4  . Years of education: Not on file  . Highest education level: Not on file  Occupational History  . Occupation: retired Marine scientist  Tobacco Use  . Smoking status: Never Smoker  . Smokeless tobacco: Never Used  Vaping Use  . Vaping Use: Never used  Substance and Sexual Activity  . Alcohol use: No  . Drug use: No  . Sexual activity: Not Currently  Other Topics Concern  . Not on file  Social History Narrative  . Not on file   Social Determinants of Health   Financial Resource Strain: Low Risk   . Difficulty of Paying Living Expenses: Not hard at all  Food Insecurity: No Food Insecurity  . Worried About Charity fundraiser in the Last Year: Never true  . Ran Out of Food in the Last Year: Never true  Transportation Needs: No Transportation Needs  . Lack of Transportation (Medical): No  . Lack  of Transportation (Non-Medical): No  Physical Activity: Insufficiently Active  . Days of Exercise per Week: 7 days  . Minutes of Exercise per Session: 10 min  Stress: No Stress Concern Present  . Feeling of Stress : Not at all  Social Connections: Moderately Integrated  . Frequency of Communication with Friends and Family: More than three times a week  . Frequency of Social Gatherings with Friends and Family: More than three times a week  . Attends Religious Services: More than 4 times per year  . Active Member of Clubs or Organizations: No  . Attends Archivist Meetings: Never  . Marital Status: Married  Human resources officer Violence: Not At Risk  . Fear of Current or Ex-Partner: No  . Emotionally Abused: No  . Physically Abused: No  . Sexually Abused: No    FAMILY HISTORY:  Family History  Problem Relation Age of Onset  . Hypertension Mother   . Heart failure Mother   . Asthma Mother   . Ulcers Mother   . Benign prostatic hyperplasia Father   . Ulcers Father   . Esophageal varices Father   . Atrial fibrillation Sister   . Atrial fibrillation Brother   .  Heart disease Brother   . Melanoma Maternal Aunt   . Colon cancer Neg Hx     CURRENT MEDICATIONS:  Current Outpatient Medications  Medication Sig Dispense Refill  . acetaminophen (TYLENOL) 500 MG tablet Take 1,000 mg by mouth 3 (three) times daily.    Marland Kitchen albuterol (VENTOLIN HFA) 108 (90 Base) MCG/ACT inhaler Inhale 2 puffs into the lungs every 4 (four) hours as needed for wheezing or shortness of breath. 18 g 2  . amLODipine (NORVASC) 10 MG tablet Take 1 tablet (10 mg total) by mouth daily with lunch. For BP 30 tablet 3  . Calcium Carb-Cholecalciferol (CALCIUM 600/VITAMIN D PO) Take 600 mg by mouth in the morning and at bedtime.    Marland Kitchen CARBOPLATIN IV Inject 1 application into the vein every 21 ( twenty-one) days.    . cetirizine (ZYRTEC) 10 MG tablet Take 10 mg by mouth daily.    . Cholecalciferol (VITAMIN D) 125 MCG  (5000 UT) CAPS Take 5,000 Units by mouth daily.    . Coenzyme Q10 (COQ10) 100 MG CAPS Take 100 mg by mouth at bedtime.     . cyclobenzaprine (FLEXERIL) 10 MG tablet Take 5 mg by mouth See admin instructions. Take 5 mg at night, may take a second 5 mg dose during the day as needed for muscle spasms    . dexamethasone (DECADRON) 4 MG tablet Take 1 tablet (4 mg total) by mouth daily. Start the day after carboplatin chemotherapy for 3 days. (Patient taking differently: Take 4 mg by mouth See admin instructions. Start the day after carboplatin chemotherapy for 3 days.) 20 tablet 0  . diltiazem (CARDIZEM) 30 MG tablet Take 1 tablet every 4 hours AS NEEDED for AFIB heart rate >100 (Patient taking differently: Take 30 mg by mouth every 4 (four) hours as needed (AFIB heart rate > 100).) 30 tablet 3  . fluticasone (FLONASE) 50 MCG/ACT nasal spray Place 1 spray into both nostrils daily.     Marland Kitchen gabapentin (NEURONTIN) 300 MG capsule Take 1 capsule (300 mg total) by mouth 3 (three) times daily. 90 capsule 2  . lidocaine-prilocaine (EMLA) cream Apply to affected area once 30 g 3  . lisinopril (ZESTRIL) 30 MG tablet Take 1 tablet (30 mg total) by mouth in the morning. Okay to restart around Monday, 05/14/2020 if blood pressure stable (Patient taking differently: Take 30 mg by mouth in the morning.) 30 tablet 3  . Magnesium 100 MG TABS Take 50 mg by mouth at bedtime.    . melatonin 5 MG TABS Take 2.5 mg by mouth at bedtime as needed (Sleep).    . metoprolol succinate (TOPROL-XL) 100 MG 24 hr tablet Take 100 mg by mouth in the morning. Take with or immediately following a meal.    . Multiple Vitamins-Minerals (CENTRUM SILVER ADULT 50+ PO) Take 1 tablet by mouth daily.    . Omega-3 Fatty Acids (FISH OIL) 1000 MG CAPS Take 1,000 mg by mouth daily.    . ondansetron (ZOFRAN) 4 MG tablet Take 1 tablet (4 mg total) by mouth every 6 (six) hours as needed for nausea. 20 tablet 0  . PACLITAXEL IV Inject 1 application into the  vein every 21 ( twenty-one) days.    . pantoprazole (PROTONIX) 40 MG tablet Take 1 tablet (40 mg total) by mouth daily. 90 tablet 3  . polyethylene glycol powder (GLYCOLAX/MIRALAX) 17 GM/SCOOP powder Take 17 g by mouth daily as needed for mild constipation or moderate constipation (With lunch).    Marland Kitchen  prochlorperazine (COMPAZINE) 10 MG tablet Take 1 tablet (10 mg total) by mouth every 6 (six) hours as needed (Nausea or vomiting). 30 tablet 1  . senna (SENOKOT) 8.6 MG tablet Take 1 tablet by mouth daily as needed for constipation.    . simvastatin (ZOCOR) 40 MG tablet Take 0.5 tablets (20 mg total) by mouth daily. (Patient taking differently: Take 40 mg by mouth at bedtime.) 30 tablet 2  . sodium chloride (OCEAN) 0.65 % SOLN nasal spray Place 1 spray into both nostrils See admin instructions. Use 1 spray in each nostril in the morning may use a second dose at night as needed for congestion    . tetrahydrozoline-zinc (VISINE-AC) 0.05-0.25 % ophthalmic solution Place 1 drop into both eyes daily.    Marland Kitchen triamcinolone (KENALOG) 0.1 % Apply 1 application topically daily as needed (irritation).    . vitamin C (ASCORBIC ACID) 500 MG tablet Take 500 mg by mouth daily.    . Vitamins A & D (VITAMIN A & D) ointment Apply 1 application topically daily as needed for dry skin (irritation).     No current facility-administered medications for this visit.    ALLERGIES:  Allergies  Allergen Reactions  . Morphine And Related Nausea And Vomiting  . Zofran [Ondansetron]     Causes minor constipation, tolerates low doses      PHYSICAL EXAM:  Performance status (ECOG): 1 - Symptomatic but completely ambulatory  Vitals:   09/19/20 0802  BP: (!) 169/83  Pulse: 82  Resp: 18  Temp: (!) 97 F (36.1 C)  SpO2: 96%   Wt Readings from Last 3 Encounters:  09/19/20 167 lb 12.8 oz (76.1 kg)  08/29/20 165 lb 9 oz (75.1 kg)  08/10/20 167 lb 1.7 oz (75.8 kg)   Physical Exam Vitals reviewed.  Constitutional:       Appearance: Normal appearance. She is obese.  Cardiovascular:     Rate and Rhythm: Normal rate and regular rhythm.     Pulses: Normal pulses.     Heart sounds: Normal heart sounds.  Pulmonary:     Effort: Pulmonary effort is normal.     Breath sounds: Normal breath sounds.  Chest:     Comments: Port-a-Cath in R chest Abdominal:     Palpations: Abdomen is soft. There is no mass.     Tenderness: There is no abdominal tenderness.  Musculoskeletal:     Right lower leg: No edema.     Left lower leg: No edema.  Neurological:     General: No focal deficit present.     Mental Status: She is alert and oriented to person, place, and time.  Psychiatric:        Mood and Affect: Mood normal.        Behavior: Behavior normal.     LABORATORY DATA:  I have reviewed the labs as listed.  CBC Latest Ref Rng & Units 09/19/2020 08/29/2020 08/08/2020  WBC 4.0 - 10.5 K/uL 6.3 7.7 4.5  Hemoglobin 12.0 - 15.0 g/dL 11.3(L) 11.5(L) 11.4(L)  Hematocrit 36.0 - 46.0 % 33.8(L) 34.3(L) 34.0(L)  Platelets 150 - 400 K/uL 248 280 257   CMP Latest Ref Rng & Units 09/19/2020 08/29/2020 08/08/2020  Glucose 70 - 99 mg/dL 87 104(H) 86  BUN 8 - 23 mg/dL 17 12 15   Creatinine 0.44 - 1.00 mg/dL 0.55 0.52 0.52  Sodium 135 - 145 mmol/L 135 134(L) 134(L)  Potassium 3.5 - 5.1 mmol/L 3.7 3.5 3.7  Chloride 98 - 111 mmol/L  101 101 103  CO2 22 - 32 mmol/L 24 23 24   Calcium 8.9 - 10.3 mg/dL 9.6 9.5 9.4  Total Protein 6.5 - 8.1 g/dL 7.1 7.3 7.0  Total Bilirubin 0.3 - 1.2 mg/dL 0.5 0.7 0.6  Alkaline Phos 38 - 126 U/L 67 73 60  AST 15 - 41 U/L 21 24 23   ALT 0 - 44 U/L 18 20 18     DIAGNOSTIC IMAGING:  I have independently reviewed the scans and discussed with the patient. No results found.   ASSESSMENT:  1.Stage II clear cell right ovarian cancer: -Status post robotic assisted total hysterectomy, BSO, omentectomy and staging on 05/03/2020. -[id number] on 04/20/2020 -Evaluated by Dr. Denman George on 05/24/2020. -6 cycles of  carboplatin and paclitaxel was recommended. -Somatic and germline mutation testing was negative. -6 cycles of carboplatin and paclitaxel from 06/06/2020 through 09/19/2020.  2. Atrial fibrillation: -Was on Eliquis. -She had postoperative GI bleed when Eliquis was discontinued.   PLAN:  1.Stage II clear-cell right ovarian cancer: -Last CA-125 was 46.5 on 08/08/2020.  Level from today is pending. -She has tolerated last cycle reasonably well. -Reviewed her labs today which showed normal LFTs.  CBC was also grossly normal.  CA-125 is pending. -Recommend cycle 6 today with the dose reduction of paclitaxel. -Follow-up in 4 weeks with labs and CTAP. -We will also arrange her for follow-ups with Dr. Denman George.  2. Peripheral neuropathy: -Numbness has been stable.  Continue gabapentin 300 mg 3 times daily.  3. Genetic testing: -Somatic and germline mutation testing was negative. -Hence not a candidate for PARP inhibitor maintenance.   Orders placed this encounter:  Orders Placed This Encounter  Procedures  . CT Abdomen Pelvis W Contrast  . CA Valdese, MD [place] [phone]   I, Milinda Antis, am acting as a scribe for Dr. Sanda Linger.  I, Derek Jack MD, have reviewed the above documentation for accuracy and completeness, and I agree with the above.

## 2020-09-19 NOTE — Progress Notes (Signed)
Labs reviewed at office visit to day with MD and pt. Ok to treat today per MD, cycle 6. No new complaints noted by patient.   Treatment given per orders. Patient tolerated it well without problems. Vitals stable and discharged home from clinic ambulatory. Follow up as scheduled.

## 2020-09-20 LAB — CA 125: Cancer Antigen (CA) 125: 41 U/mL — ABNORMAL HIGH (ref 0.0–38.1)

## 2020-09-21 ENCOUNTER — Inpatient Hospital Stay (HOSPITAL_COMMUNITY): Payer: Medicare Other | Attending: Hematology

## 2020-09-21 ENCOUNTER — Other Ambulatory Visit: Payer: Self-pay

## 2020-09-21 VITALS — BP 170/76 | HR 80 | Temp 97.4°F | Resp 18 | Wt 167.4 lb

## 2020-09-21 DIAGNOSIS — Z5189 Encounter for other specified aftercare: Secondary | ICD-10-CM | POA: Diagnosis not present

## 2020-09-21 DIAGNOSIS — C561 Malignant neoplasm of right ovary: Secondary | ICD-10-CM | POA: Insufficient documentation

## 2020-09-21 MED ORDER — PEGFILGRASTIM-CBQV 6 MG/0.6ML ~~LOC~~ SOSY
6.0000 mg | PREFILLED_SYRINGE | Freq: Once | SUBCUTANEOUS | Status: AC
  Administered 2020-09-21: 6 mg via SUBCUTANEOUS
  Filled 2020-09-21: qty 0.6

## 2020-09-21 NOTE — Progress Notes (Signed)
Patient tolerated Udencya injection with no complaints voiced.  Site clean and dry with no bruising or swelling noted.  No complaints of pain.  Discharged with vital signs stable and no signs or symptoms of distress noted.  

## 2020-10-16 ENCOUNTER — Other Ambulatory Visit: Payer: Self-pay

## 2020-10-16 ENCOUNTER — Encounter (HOSPITAL_COMMUNITY): Payer: Self-pay

## 2020-10-16 ENCOUNTER — Inpatient Hospital Stay (HOSPITAL_COMMUNITY): Payer: Medicare Other

## 2020-10-16 ENCOUNTER — Ambulatory Visit (HOSPITAL_COMMUNITY)
Admission: RE | Admit: 2020-10-16 | Discharge: 2020-10-16 | Disposition: A | Discharge status: Home / Self Care | Payer: Medicare Other | Source: Ambulatory Visit | Attending: Hematology | Admitting: Hematology

## 2020-10-16 DIAGNOSIS — R1031 Right lower quadrant pain: Secondary | ICD-10-CM | POA: Insufficient documentation

## 2020-10-16 DIAGNOSIS — R1900 Intra-abdominal and pelvic swelling, mass and lump, unspecified site: Secondary | ICD-10-CM | POA: Insufficient documentation

## 2020-10-16 DIAGNOSIS — C561 Malignant neoplasm of right ovary: Secondary | ICD-10-CM | POA: Diagnosis present

## 2020-10-16 LAB — COMPREHENSIVE METABOLIC PANEL
ALT: 19 U/L (ref 0–44)
AST: 24 U/L (ref 15–41)
Albumin: 4.2 g/dL (ref 3.5–5.0)
Alkaline Phosphatase: 57 U/L (ref 38–126)
Anion gap: 9 (ref 5–15)
BUN: 10 mg/dL (ref 8–23)
CO2: 23 mmol/L (ref 22–32)
Calcium: 9 mg/dL (ref 8.9–10.3)
Chloride: 100 mmol/L (ref 98–111)
Creatinine, Ser: 0.44 mg/dL (ref 0.44–1.00)
GFR, Estimated: >60 mL/min (ref >60)
Glucose, Bld: 91 mg/dL (ref 70–99)
Potassium: 3.5 mmol/L (ref 3.5–5.1)
Sodium: 132 mmol/L — ABNORMAL LOW (ref 135–145)
Total Bilirubin: 0.5 mg/dL (ref 0.3–1.2)
Total Protein: 6.7 g/dL (ref 6.5–8.1)

## 2020-10-16 LAB — CBC WITH DIFFERENTIAL/PLATELET
Abs Immature Granulocytes: 0.03 10*3/uL (ref 0.00–0.07)
Basophils Absolute: 0 10*3/uL (ref 0.0–0.1)
Basophils Relative: 0 %
Eosinophils Absolute: 0.1 10*3/uL (ref 0.0–0.5)
Eosinophils Relative: 1 %
HCT: 31.8 % — ABNORMAL LOW (ref 36.0–46.0)
Hemoglobin: 10.7 g/dL — ABNORMAL LOW (ref 12.0–15.0)
Immature Granulocytes: 0 %
Lymphocytes Relative: 13 %
Lymphs Abs: 1 10*3/uL (ref 0.7–4.0)
MCH: 35.9 pg — ABNORMAL HIGH (ref 26.0–34.0)
MCHC: 33.6 g/dL (ref 30.0–36.0)
MCV: 106.7 fL — ABNORMAL HIGH (ref 80.0–100.0)
Monocytes Absolute: 0.7 10*3/uL (ref 0.1–1.0)
Monocytes Relative: 10 %
Neutro Abs: 5.6 10*3/uL (ref 1.7–7.7)
Neutrophils Relative %: 76 %
Platelets: 273 10*3/uL (ref 150–400)
RBC: 2.98 MIL/uL — ABNORMAL LOW (ref 3.87–5.11)
RDW: 14.8 % (ref 11.5–15.5)
WBC: 7.3 10*3/uL (ref 4.0–10.5)
nRBC: 0 % (ref 0.0–0.2)

## 2020-10-16 LAB — MAGNESIUM: Magnesium: 1.9 mg/dL (ref 1.7–2.4)

## 2020-10-16 MED ORDER — IOHEXOL 300 MG/ML  SOLN
100.0000 mL | Freq: Once | INTRAMUSCULAR | Status: AC | PRN
  Administered 2020-10-16: 100 mL via INTRAVENOUS

## 2020-10-16 MED ORDER — SODIUM CHLORIDE 0.9% FLUSH: 10.0000 mL | INTRAVENOUS | Status: DC | PRN

## 2020-10-16 MED ORDER — HEPARIN SOD (PORK) LOCK FLUSH 100 UNIT/ML IV SOLN: 500.0000 [IU] | Freq: Once | INTRAVENOUS | Status: DC

## 2020-10-16 NOTE — Progress Notes (Signed)
April Guerra presented for Portacath de-access and flush from CT.  Portacath located right chest wall.  Good blood return present. Portacath flushed with 69ml NS and 500U/16ml Heparin and needle removed intact.  Procedure tolerated well and without incident.  Blood work drawn for office visit next week.  Pt stable during and after procedure.  AVS reviewed.  Discharged in stable condition via wheelchair.

## 2020-10-16 NOTE — Patient Instructions (Signed)
[facility]  Discharge Instructions: Thank you for choosing [facility] to provide your oncology and hematology care.  If you have a lab appointment with the [facility], please come in thru the Main Entrance and check in at the main information desk.  Wear comfortable clothing and clothing appropriate for easy access to any Portacath or PICC line.   We strive to give you quality time with your provider. You may need to reschedule your appointment if you arrive late (15 or more minutes).  Arriving late affects you and other patients whose appointments are after yours.  Also, if you miss three or more appointments without notifying the office, you may be dismissed from the clinic at the provider's discretion.      For prescription refill requests, have your pharmacy contact our office and allow 72 hours for refills to be completed.    CT scan today.  Port flush with labs drawn.  Follow up as scheduled.  Please call the clinic if you have any questions or concerns.    To help prevent nausea and vomiting after your treatment, we encourage you to take your nausea medication as directed.  BELOW ARE SYMPTOMS THAT SHOULD BE REPORTED IMMEDIATELY: . *FEVER GREATER THAN 100.4 F (38 C) OR HIGHER . *CHILLS OR SWEATING . *NAUSEA AND VOMITING THAT IS NOT CONTROLLED WITH YOUR NAUSEA MEDICATION . *UNUSUAL SHORTNESS OF BREATH . *UNUSUAL BRUISING OR BLEEDING . *URINARY PROBLEMS (pain or burning when urinating, or frequent urination) . *BOWEL PROBLEMS (unusual diarrhea, constipation, pain near the anus) . TENDERNESS IN MOUTH AND THROAT WITH OR WITHOUT PRESENCE OF ULCERS (sore throat, sores in mouth, or a toothache) . UNUSUAL RASH, SWELLING OR PAIN  . UNUSUAL VAGINAL DISCHARGE OR ITCHING   Items with * indicate a potential emergency and should be followed up as soon as possible or go to the Emergency Department if any problems should occur.  Please show the CHEMOTHERAPY ALERT  CARD or IMMUNOTHERAPY ALERT CARD at check-in to the Emergency Department and triage nurse.  Should you have questions after your visit or need to cancel or reschedule your appointment, please contact [facility] [phone]  and follow the prompts.  Office hours are 8:00 a.m. to 4:30 p.m. Monday - Friday. Please note that voicemails left after 4:00 p.m. may not be returned until the following business day.  We are closed weekends and major holidays. You have access to a nurse at all times for urgent questions. Please call the main number to the clinic [phone] and follow the prompts.  For any non-urgent questions, you may also contact your provider using MyChart. We now offer e-Visits for anyone 1 and older to request care online for non-urgent symptoms. For details visit [email or web address].   Also download the MyChart app! Go to the app store, search "MyChart", open the app, select [facility], and log in with your MyChart username and password.  Due to Covid, a mask is required upon entering the hospital/clinic. If you do not have a mask, one will be given to you upon arrival. For doctor visits, patients may have 1 support person aged 60 or older with them. For treatment visits, patients cannot have anyone with them due to current Covid guidelines and our immunocompromised population.

## 2020-10-17 LAB — CA 125: Cancer Antigen (CA) 125: 40.9 U/mL — ABNORMAL HIGH (ref 0.0–38.1)

## 2020-10-24 ENCOUNTER — Other Ambulatory Visit: Payer: Self-pay

## 2020-10-24 ENCOUNTER — Inpatient Hospital Stay (HOSPITAL_COMMUNITY): Payer: Medicare Other | Attending: Hematology | Admitting: Hematology

## 2020-10-24 ENCOUNTER — Other Ambulatory Visit (HOSPITAL_COMMUNITY): Payer: Self-pay

## 2020-10-24 VITALS — BP 170/93 | HR 69 | Temp 98.8°F | Resp 16 | Wt 168.0 lb

## 2020-10-24 DIAGNOSIS — Z885 Allergy status to narcotic agent status: Secondary | ICD-10-CM | POA: Insufficient documentation

## 2020-10-24 DIAGNOSIS — Z8249 Family history of ischemic heart disease and other diseases of the circulatory system: Secondary | ICD-10-CM | POA: Diagnosis not present

## 2020-10-24 DIAGNOSIS — G629 Polyneuropathy, unspecified: Secondary | ICD-10-CM | POA: Diagnosis not present

## 2020-10-24 DIAGNOSIS — Z79899 Other long term (current) drug therapy: Secondary | ICD-10-CM | POA: Insufficient documentation

## 2020-10-24 DIAGNOSIS — Z836 Family history of other diseases of the respiratory system: Secondary | ICD-10-CM | POA: Insufficient documentation

## 2020-10-24 DIAGNOSIS — R42 Dizziness and giddiness: Secondary | ICD-10-CM | POA: Insufficient documentation

## 2020-10-24 DIAGNOSIS — M25472 Effusion, left ankle: Secondary | ICD-10-CM | POA: Insufficient documentation

## 2020-10-24 DIAGNOSIS — Z888 Allergy status to other drugs, medicaments and biological substances status: Secondary | ICD-10-CM | POA: Insufficient documentation

## 2020-10-24 DIAGNOSIS — R5383 Other fatigue: Secondary | ICD-10-CM | POA: Insufficient documentation

## 2020-10-24 DIAGNOSIS — M25471 Effusion, right ankle: Secondary | ICD-10-CM | POA: Insufficient documentation

## 2020-10-24 DIAGNOSIS — Z8379 Family history of other diseases of the digestive system: Secondary | ICD-10-CM | POA: Insufficient documentation

## 2020-10-24 DIAGNOSIS — Z808 Family history of malignant neoplasm of other organs or systems: Secondary | ICD-10-CM | POA: Insufficient documentation

## 2020-10-24 DIAGNOSIS — Z90722 Acquired absence of ovaries, bilateral: Secondary | ICD-10-CM | POA: Diagnosis not present

## 2020-10-24 DIAGNOSIS — C561 Malignant neoplasm of right ovary: Secondary | ICD-10-CM | POA: Diagnosis present

## 2020-10-24 DIAGNOSIS — I700 Atherosclerosis of aorta: Secondary | ICD-10-CM | POA: Insufficient documentation

## 2020-10-24 DIAGNOSIS — I4891 Unspecified atrial fibrillation: Secondary | ICD-10-CM | POA: Diagnosis not present

## 2020-10-24 DIAGNOSIS — Z842 Family history of other diseases of the genitourinary system: Secondary | ICD-10-CM | POA: Diagnosis not present

## 2020-10-24 DIAGNOSIS — K91841 Postprocedural hemorrhage and hematoma of a digestive system organ or structure following other procedure: Secondary | ICD-10-CM | POA: Insufficient documentation

## 2020-10-24 DIAGNOSIS — R0602 Shortness of breath: Secondary | ICD-10-CM | POA: Insufficient documentation

## 2020-10-24 DIAGNOSIS — Z9049 Acquired absence of other specified parts of digestive tract: Secondary | ICD-10-CM | POA: Insufficient documentation

## 2020-10-24 DIAGNOSIS — K5730 Diverticulosis of large intestine without perforation or abscess without bleeding: Secondary | ICD-10-CM | POA: Diagnosis not present

## 2020-10-24 MED ORDER — GABAPENTIN 300 MG PO CAPS: 300.0000 mg | ORAL_CAPSULE | Freq: Three times a day (TID) | ORAL | 2 refills | Status: DC

## 2020-10-24 NOTE — Patient Instructions (Signed)
[facility] Discharge Instructions  You were seen today by Dr. Katragadda. He went over your recent results. Dr. Katragadda will see you back in 3 months for labs and follow up.   Thank you for choosing  [facility] Center at [facility] to provide your oncology and hematology care.  To afford each patient quality time with our provider, please arrive at least 15 minutes before your scheduled appointment time.   If you have a lab appointment with the Cancer Center please come in thru the Main Entrance and check in at the main information desk  You need to re-schedule your appointment should you arrive 10 or more minutes late.  We strive to give you quality time with our providers, and arriving late affects you and other patients whose appointments are after yours.  Also, if you no show three or more times for appointments you may be dismissed from the clinic at the providers discretion.     Again, thank you for choosing [facility].  Our hope is that these requests will decrease the amount of time that you wait before being seen by our physicians.       _____________________________________________________________  Should you have questions after your visit to [facility], please contact our office at [phone] between the hours of 8:00 a.m. and 4:30 p.m.  Voicemails left after 4:00 p.m. will not be returned until the following business day.  For prescription refill requests, have your pharmacy contact our office and allow 72 hours.    Cancer Center Support Programs:   > Cancer Support Group  2nd Tuesday of the month 1pm-2pm, Journey Room   

## 2020-10-24 NOTE — Progress Notes (Signed)
[place], [place] [address]   CLINIC:  Medical Oncology/Hematology  PCP:  Manon Hilding, MD [address] [place] [address] [phone]   REASON FOR VISIT:  Follow-up for right ovarian cancer  PRIOR THERAPY:  1. TAH-BSO on 05/03/2020. 2. Carboplatin, paclitaxel & Aloxi x 6 cycles from 06/06/2020 to 09/19/2020.  NGS Results: Not done  CURRENT THERAPY: Surveillance  BRIEF ONCOLOGIC HISTORY:  Oncology History  Right ovarian epithelial cancer ([place])  05/03/2020 Initial Diagnosis   Right ovarian epithelial cancer ([place])   05/03/2020 Cancer Staging   Staging form: Ovary, Fallopian Tube, and Primary Peritoneal Carcinoma, AJCC 8th Edition - Clinical stage from 05/03/2020: FIGO Stage II, calculated as Stage Unknown (cT2b, cNX, cM0) - Signed by Everitt Amber, MD on 05/24/2020   06/06/2020 -  Chemotherapy    Patient is on Treatment Plan: OVARIAN CARBOPLATIN (AUC 6) / PACLITAXEL (175) Q21D X 6 CYCLES       Genetic Testing   Negative genetic testing. No pathogenic variants identified on the [employer]. The report date is 08/10/2020.  The CancerNext gene panel offered by [employer] includes sequencing and rearrangement analysis for the following 36 genes: APC*, ATM*, AXIN2, BARD1, BMPR1A, BRCA1*, BRCA2*, BRIP1*, CDH1*, CDK4, CDKN2A, CHEK2*, DICER1, MLH1*, MSH2*, MSH3, MSH6*, MUTYH*, NBN, NF1*, NTHL1, PALB2*, PMS2*, PTEN*, RAD51C*, RAD51D*, RECQL, SMAD4, SMARCA4, STK11 and TP53* (sequencing and deletion/duplication); HOXB13, POLD1 and POLE (sequencing only); EPCAM and GREM1 (deletion/duplication only).   Somatic genes analyzed through TumorNext-HRD: ATM, BARD1, BRCA1, BRCA2, BRIP1, CHEK2, MRE11A, NBN, PALB2, RAD51C, RAD51D.     CANCER STAGING: Cancer Staging Right ovarian epithelial cancer [facility]) Staging form: Ovary, Fallopian Tube, and Primary Peritoneal Carcinoma, AJCC 8th Edition - Clinical stage from 05/03/2020:  FIGO Stage II, calculated as Stage Unknown (cT2b, cNX, cM0) - Signed by Everitt Amber, MD on 05/24/2020   INTERVAL HISTORY:  [name], a 75 y.o. female, returns for routine follow-up of her right ovarian cancer. April Guerra was last seen on 09/19/2020.   Today she reports feeling okay. She complains of having slightly worsening constant numbness and burning pain in her toes and fingertips, worse when she stands for a prolonged time; she is taking gabapentin 300 mg TID which is helping control the pain and denies feeling drowsy. She reports having swelling in her feet to her ankles due to amlodipine. She denies having any falls and wears gloves if she works with her hands. She denies having any abdominal pain or pressure. Her energy levels are decreased and she get SOB after an hour of work.   REVIEW OF SYSTEMS:  Review of Systems  Constitutional: Positive for fatigue (50%). Negative for appetite change.  Respiratory: Positive for shortness of breath (w/ prolonged exertion).   Cardiovascular: Positive for leg swelling.  Gastrointestinal: Negative for abdominal pain.  Neurological: Positive for dizziness and numbness (worsening numbness & burning pain in toes & fingertips).  All other systems reviewed and are negative.   PAST MEDICAL/SURGICAL HISTORY:  Past Medical History:  Diagnosis Date  . A-fib ([place])   . Anxiety   . Arthritis   . Asthma    hx of   . Cancer ([place])    ovarian  . Dyspnea    with exertion   . H/O seasonal allergies   . Heart murmur    as aa child   . Hypercholesteremia   . Hypertension   . Spinal stenosis    Past Surgical  History:  Procedure Laterality Date  . ABDOMINAL HYSTERECTOMY    . BIOPSY  05/08/2020   Procedure: BIOPSY;  Surgeon: Harvel Quale, MD;  [facility];  Service: Gastroenterology;;  . BREAST SURGERY    . CARDIAC CATHETERIZATION    . CHOLECYSTECTOMY    . COLONOSCOPY N/A 08/14/2015   Procedure: COLONOSCOPY;  Surgeon:  Aviva Signs, MD;  [facility];  Service: Gastroenterology;  Laterality: N/A;  . ESOPHAGOGASTRODUODENOSCOPY (EGD) WITH PROPOFOL N/A 05/08/2020   Procedure: ESOPHAGOGASTRODUODENOSCOPY (EGD) WITH PROPOFOL;  Surgeon: Harvel Quale, MD;  [facility];  Service: Gastroenterology;  Laterality: N/A;  . ESOPHAGOGASTRODUODENOSCOPY (EGD) WITH PROPOFOL N/A 08/07/2020   Procedure: ESOPHAGOGASTRODUODENOSCOPY (EGD) WITH PROPOFOL;  Surgeon: Harvel Quale, MD;  [facility];  Service: Gastroenterology;  Laterality: N/A;  7:30  . IR IMAGING GUIDED PORT INSERTION  06/04/2020  . ROBOTIC ASSISTED TOTAL HYSTERECTOMY WITH BILATERAL SALPINGO OOPHERECTOMY N/A 05/03/2020   Procedure: XI ROBOTIC ASSISTED TOTAL HYSTERECTOMY WITH BILATERAL SALPINGO OOPHORECTOMY, OMENTECTOMY,RADICAL TUMOR DEBULKING, RIGID PROSTOSCOPY;  Surgeon: Everitt Amber, MD;  Location: [facility];  Service: Gynecology;  Laterality: N/A;  . THROMBECTOMY FEMORAL ARTERY Right 12/17/2013   Procedure: REPAIR OF RIGHT FEMORAL ARTERY PSEUDOANEURYSM;  Surgeon: Elam Dutch, MD;  Location: [facility];  Service: Vascular;  Laterality: Right;    SOCIAL HISTORY:  Social History   Socioeconomic History  . Marital status: Married    Spouse name: Not on file  . Number of children: 4  . Years of education: Not on file  . Highest education level: Not on file  Occupational History  . Occupation: retired Marine scientist  Tobacco Use  . Smoking status: Never Smoker  . Smokeless tobacco: Never Used  Vaping Use  . Vaping Use: Never used  Substance and Sexual Activity  . Alcohol use: No  . Drug use: No  . Sexual activity: Not Currently  Other Topics Concern  . Not on file  Social History Narrative  . Not on file   Social Determinants of Health   Financial Resource Strain: Low Risk   . Difficulty of Paying Living Expenses: Not hard at all  Food Insecurity: No Food Insecurity  . Worried About Charity fundraiser in  the Last Year: Never true  . Ran Out of Food in the Last Year: Never true  Transportation Needs: No Transportation Needs  . Lack of Transportation (Medical): No  . Lack of Transportation (Non-Medical): No  Physical Activity: Insufficiently Active  . Days of Exercise per Week: 7 days  . Minutes of Exercise per Session: 10 min  Stress: No Stress Concern Present  . Feeling of Stress : Not at all  Social Connections: Moderately Integrated  . Frequency of Communication with Friends and Family: More than three times a week  . Frequency of Social Gatherings with Friends and Family: More than three times a week  . Attends Religious Services: More than 4 times per year  . Active Member of Clubs or Organizations: No  . Attends Archivist Meetings: Never  . Marital Status: Married  Human resources officer Violence: Not At Risk  . Fear of Current or Ex-Partner: No  . Emotionally Abused: No  . Physically Abused: No  . Sexually Abused: No    FAMILY HISTORY:  Family History  Problem Relation Age of Onset  . Hypertension Mother   . Heart failure Mother   . Asthma Mother   . Ulcers Mother   . Benign prostatic  hyperplasia Father   . Ulcers Father   . Esophageal varices Father   . Atrial fibrillation Sister   . Atrial fibrillation Brother   . Heart disease Brother   . Melanoma Maternal Aunt   . Colon cancer Neg Hx     CURRENT MEDICATIONS:  Current Outpatient Medications  Medication Sig Dispense Refill  . acetaminophen (TYLENOL) 500 MG tablet Take 1,000 mg by mouth 3 (three) times daily.    Marland Kitchen albuterol (VENTOLIN HFA) 108 (90 Base) MCG/ACT inhaler Inhale 2 puffs into the lungs every 4 (four) hours as needed for wheezing or shortness of breath. 18 g 2  . amLODipine (NORVASC) 10 MG tablet Take 1 tablet (10 mg total) by mouth daily with lunch. For BP 30 tablet 3  . aspirin EC 81 MG tablet Take 81 mg by mouth daily. Swallow whole.    . Calcium Carb-Cholecalciferol (CALCIUM 600/VITAMIN D PO)  Take 600 mg by mouth in the morning and at bedtime.    Marland Kitchen CARBOPLATIN IV Inject 1 application into the vein every 21 ( twenty-one) days.    . cetirizine (ZYRTEC) 10 MG tablet Take 10 mg by mouth daily.    . Cholecalciferol (VITAMIN D) 125 MCG (5000 UT) CAPS Take 5,000 Units by mouth daily.    . Coenzyme Q10 (COQ10) 100 MG CAPS Take 100 mg by mouth at bedtime.     . cyclobenzaprine (FLEXERIL) 10 MG tablet Take 5 mg by mouth See admin instructions. Take 5 mg at night, may take a second 5 mg dose during the day as needed for muscle spasms    . dexamethasone (DECADRON) 4 MG tablet Take 1 tablet (4 mg total) by mouth daily. Start the day after carboplatin chemotherapy for 3 days. (Patient taking differently: Take 4 mg by mouth See admin instructions. Start the day after carboplatin chemotherapy for 3 days.) 20 tablet 0  . diltiazem (CARDIZEM) 30 MG tablet Take 1 tablet every 4 hours AS NEEDED for AFIB heart rate >100 (Patient taking differently: Take 30 mg by mouth every 4 (four) hours as needed (AFIB heart rate > 100).) 30 tablet 3  . fluticasone (FLONASE) 50 MCG/ACT nasal spray Place 1 spray into both nostrils daily.     Marland Kitchen gabapentin (NEURONTIN) 300 MG capsule Take 1 capsule (300 mg total) by mouth 3 (three) times daily. 90 capsule 2  . lisinopril (ZESTRIL) 30 MG tablet Take 1 tablet (30 mg total) by mouth in the morning. Okay to restart around Monday, 05/14/2020 if blood pressure stable (Patient taking differently: Take 30 mg by mouth in the morning.) 30 tablet 3  . Magnesium 100 MG TABS Take 50 mg by mouth at bedtime.    . melatonin 5 MG TABS Take 2.5 mg by mouth at bedtime as needed (Sleep).    . metoprolol succinate (TOPROL-XL) 100 MG 24 hr tablet Take 100 mg by mouth in the morning. Take with or immediately following a meal.    . Multiple Vitamins-Minerals (CENTRUM SILVER ADULT 50+ PO) Take 1 tablet by mouth daily.    . Omega-3 Fatty Acids (FISH OIL) 1000 MG CAPS Take 1,000 mg by mouth daily.    .  ondansetron (ZOFRAN) 4 MG tablet Take 1 tablet (4 mg total) by mouth every 6 (six) hours as needed for nausea. 20 tablet 0  . PACLITAXEL IV Inject 1 application into the vein every 21 ( twenty-one) days.    . pantoprazole (PROTONIX) 40 MG tablet Take 1 tablet (40 mg total) by  mouth daily. 90 tablet 3  . polyethylene glycol powder (GLYCOLAX/MIRALAX) 17 GM/SCOOP powder Take 17 g by mouth daily as needed for mild constipation or moderate constipation (With lunch).    . prochlorperazine (COMPAZINE) 10 MG tablet Take 1 tablet (10 mg total) by mouth every 6 (six) hours as needed (Nausea or vomiting). 30 tablet 1  . senna (SENOKOT) 8.6 MG tablet Take 1 tablet by mouth daily as needed for constipation.    . simvastatin (ZOCOR) 40 MG tablet Take 0.5 tablets (20 mg total) by mouth daily. (Patient taking differently: Take 40 mg by mouth at bedtime.) 30 tablet 2  . sodium chloride (OCEAN) 0.65 % SOLN nasal spray Place 1 spray into both nostrils See admin instructions. Use 1 spray in each nostril in the morning may use a second dose at night as needed for congestion    . tetrahydrozoline-zinc (VISINE-AC) 0.05-0.25 % ophthalmic solution Place 1 drop into both eyes daily.    Marland Kitchen triamcinolone (KENALOG) 0.1 % Apply 1 application topically daily as needed (irritation).    . vitamin C (ASCORBIC ACID) 500 MG tablet Take 500 mg by mouth daily.    . Vitamins A & D (VITAMIN A & D) ointment Apply 1 application topically daily as needed for dry skin (irritation).    Marland Kitchen lidocaine-prilocaine (EMLA) cream Apply to affected area once (Patient not taking: Reported on 10/24/2020) 30 g 3   No current facility-administered medications for this visit.    ALLERGIES:  Allergies  Allergen Reactions  . Morphine And Related Nausea And Vomiting  . Zofran [Ondansetron]     Causes minor constipation, tolerates low doses      PHYSICAL EXAM:  Performance status (ECOG): 1 - Symptomatic but completely ambulatory  Vitals:   10/24/20 1622   BP: (!) 170/93  Pulse: 69  Resp: 16  Temp: 98.8 F (37.1 C)  SpO2: 97%   Wt Readings from Last 3 Encounters:  10/24/20 168 lb (76.2 kg)  09/21/20 167 lb 6.4 oz (75.9 kg)  09/19/20 167 lb 12.8 oz (76.1 kg)   Physical Exam Vitals reviewed.  Constitutional:      Appearance: Normal appearance. She is obese.  Cardiovascular:     Rate and Rhythm: Normal rate and regular rhythm.     Pulses: Normal pulses.     Heart sounds: Normal heart sounds.  Pulmonary:     Effort: Pulmonary effort is normal.     Breath sounds: Normal breath sounds.  Chest:  Breasts:     Right: No axillary adenopathy or supraclavicular adenopathy.     Left: No axillary adenopathy or supraclavicular adenopathy.    Abdominal:     Palpations: Abdomen is soft. There is no hepatomegaly or mass.     Tenderness: There is no abdominal tenderness.     Hernia: No hernia is present.  Musculoskeletal:     Right lower leg: No edema.     Left lower leg: No edema.  Lymphadenopathy:     Upper Body:     Right upper body: No supraclavicular, axillary or pectoral adenopathy.     Left upper body: No supraclavicular, axillary or pectoral adenopathy.  Neurological:     General: No focal deficit present.     Mental Status: She is alert and oriented to person, place, and time.  Psychiatric:        Mood and Affect: Mood normal.        Behavior: Behavior normal.      LABORATORY DATA:  I  have reviewed the labs as listed.  CBC Latest Ref Rng & Units 10/16/2020 09/19/2020 08/29/2020  WBC 4.0 - 10.5 K/uL 7.3 6.3 7.7  Hemoglobin 12.0 - 15.0 g/dL 10.7(L) 11.3(L) 11.5(L)  Hematocrit 36.0 - 46.0 % 31.8(L) 33.8(L) 34.3(L)  Platelets 150 - 400 K/uL 273 248 280   CMP Latest Ref Rng & Units 10/16/2020 09/19/2020 08/29/2020  Glucose 70 - 99 mg/dL 91 87 104(H)  BUN 8 - 23 mg/dL 10 17 12   Creatinine 0.44 - 1.00 mg/dL 0.44 0.55 0.52  Sodium 135 - 145 mmol/L 132(L) 135 134(L)  Potassium 3.5 - 5.1 mmol/L 3.5 3.7 3.5  Chloride 98 - 111 mmol/L  100 101 101  CO2 22 - 32 mmol/L 23 24 23   Calcium 8.9 - 10.3 mg/dL 9.0 9.6 9.5  Total Protein 6.5 - 8.1 g/dL 6.7 7.1 7.3  Total Bilirubin 0.3 - 1.2 mg/dL 0.5 0.5 0.7  Alkaline Phos 38 - 126 U/L 57 67 73  AST 15 - 41 U/L 24 21 24   ALT 0 - 44 U/L 19 18 20     DIAGNOSTIC IMAGING:  I have independently reviewed the scans and discussed with the patient. CT Abdomen Pelvis W Contrast  Result Date: 10/16/2020 CLINICAL DATA:  Assess for treatment response in the setting of ovarian cancer post hysterectomy and oophorectomy. EXAM: CT ABDOMEN AND PELVIS WITH CONTRAST TECHNIQUE: Multidetector CT imaging of the abdomen and pelvis was performed using the standard protocol following bolus administration of intravenous contrast. CONTRAST:  136m OMNIPAQUE IOHEXOL 300 MG/ML  SOLN COMPARISON:  April 11, 2020 FINDINGS: Lower chest: Central venous access device terminates at the caval to atrial junction. No pericardial effusion. Heart is incompletely imaged. Lung bases with minimal basilar atelectasis. Hepatobiliary: No focal, suspicious hepatic lesion. The portal vein is patent. Liver contour is smooth. Post cholecystectomy. Pancreas: Normal, without mass, inflammation or ductal dilatation. Spleen: Normal spleen. Adrenals/Urinary Tract: Adrenal glands are normal. Symmetric renal enhancement. Subcentimeter low density lesion in the anterior lower pole the LEFT kidney likely a small cyst. Not changed from previous imaging. No hydronephrosis. No perinephric stranding. Urinary bladder is collapsed limiting assessment. Stomach/Bowel: Colonic diverticulosis. Normal appendix. Stomach under distended without perigastric stranding. Small bowel normal caliber. Vascular/Lymphatic: Calcified and noncalcified atheromatous plaque in the abdominal aorta. Smooth contour of the IVC. There is no gastrohepatic or hepatoduodenal ligament lymphadenopathy. No retroperitoneal or mesenteric lymphadenopathy. No pelvic sidewall lymphadenopathy.  Reproductive: Post hysterectomy. No discrete mass lesion or ascites. Minimal increased density at the vaginal apex with some fat stranding without nodularity and without adjacent colonic or vaginal thickening. Other: No ascites.  No peritoneal nodularity. Musculoskeletal: Spinal degenerative changes. No acute or destructive bone findings. IMPRESSION: 1. No evidence of recurrent or metastatic disease. 2. Minimal increased density a at the vaginal apex may simply reflect postoperative changes, no measurable soft tissue in this location on image 66 of series 2. This will serve as a baseline for future follow-up. 3. Colonic diverticulosis. 4. Aortic atherosclerosis. Aortic Atherosclerosis (ICD10-I70.0). Electronically Signed   By: GZetta BillsM.D.   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 --[id number] 04/20/2020 -Evaluated by Dr. RDenman Georgeon 05/24/2020. -6 cycles of carboplatin and paclitaxel was recommended. -Somatic and germline mutation testing was negative. -6 cycles of carboplatin and paclitaxel from 06/06/2020 through 09/19/2020.  2. Atrial fibrillation: -Was on Eliquis. -She had postoperative GI bleed when Eliquis  was discontinued.   PLAN:  1.Stage II clear-cell right ovarian cancer: -She has completed 6 cycles of carboplatin and paclitaxel. - We reviewed CTAP from 10/16/2020 which did not show any evidence of recurrence.  Increased density at the vaginal apex thought to be treatment related.  CA125 has improved to 40.9. - Comprehensive metabolic panel is within normal limits. - I have recommended follow-up in 3 months with repeat CA125 level.  Continue to follow-up with Dr. Denman George.  2. Peripheral neuropathy: -She reports slight worsening of neuropathy in the fingertips and feet after last cycle. - Continue gabapentin 300 mg 3 times a day.  3.  Genetic testing: -Somatic and germline mutation testing was negative. -Has not a candidate for PARPi therapy.   Orders placed this encounter:  Orders Placed This Encounter  Procedures  . CA 125  . CBC with Differential/Platelet  . Comprehensive metabolic panel  . Magnesium     Derek Jack, MD [facility] [phone]   I, Milinda Antis, am acting as a scribe for Dr. Sanda Linger.  I, Derek Jack MD, have reviewed the above documentation for accuracy and completeness, and I agree with the above.

## 2020-12-07 DIAGNOSIS — G629 Polyneuropathy, unspecified: Secondary | ICD-10-CM | POA: Insufficient documentation

## 2021-01-10 ENCOUNTER — Other Ambulatory Visit (HOSPITAL_COMMUNITY): Payer: Self-pay | Admitting: *Deleted

## 2021-01-10 MED ORDER — GABAPENTIN 300 MG PO CAPS: 300.0000 mg | ORAL_CAPSULE | Freq: Three times a day (TID) | ORAL | 2 refills | Status: DC

## 2021-01-14 ENCOUNTER — Telehealth (INDEPENDENT_AMBULATORY_CARE_PROVIDER_SITE_OTHER): Payer: Self-pay

## 2021-01-14 NOTE — Telephone Encounter (Signed)
I called and left a message asked that the patient please return call to confirm where she would like to have this medication sent.

## 2021-01-14 NOTE — Telephone Encounter (Signed)
Patient called and states she is on vacation and forgot her Protonix at home. She wants to know if we can send in a rx to the local [facility] in [facility].

## 2021-01-15 NOTE — Telephone Encounter (Signed)
Patient states the [facility] transferred her rx and she was able to pick it up yesterday from the local [facility].

## 2021-01-15 NOTE — Telephone Encounter (Signed)
Left a message to return call.  

## 2021-01-21 ENCOUNTER — Other Ambulatory Visit (HOSPITAL_COMMUNITY): Payer: Medicare Other

## 2021-01-22 ENCOUNTER — Encounter (HOSPITAL_COMMUNITY): Payer: Self-pay

## 2021-01-22 ENCOUNTER — Other Ambulatory Visit: Payer: Self-pay

## 2021-01-22 ENCOUNTER — Inpatient Hospital Stay (HOSPITAL_COMMUNITY): Payer: Medicare Other | Attending: Hematology

## 2021-01-22 DIAGNOSIS — C561 Malignant neoplasm of right ovary: Secondary | ICD-10-CM | POA: Diagnosis not present

## 2021-01-22 LAB — COMPREHENSIVE METABOLIC PANEL
ALT: 22 U/L (ref 0–44)
AST: 19 U/L (ref 15–41)
Albumin: 4.2 g/dL (ref 3.5–5.0)
Alkaline Phosphatase: 45 U/L (ref 38–126)
Anion gap: 7 (ref 5–15)
BUN: 19 mg/dL (ref 8–23)
CO2: 27 mmol/L (ref 22–32)
Calcium: 9.2 mg/dL (ref 8.9–10.3)
Chloride: 101 mmol/L (ref 98–111)
Creatinine, Ser: 0.71 mg/dL (ref 0.44–1.00)
GFR, Estimated: >60 mL/min (ref >60)
Glucose, Bld: 92 mg/dL (ref 70–99)
Potassium: 4 mmol/L (ref 3.5–5.1)
Sodium: 135 mmol/L (ref 135–145)
Total Bilirubin: 0.9 mg/dL (ref 0.3–1.2)
Total Protein: 6.9 g/dL (ref 6.5–8.1)

## 2021-01-22 LAB — CBC WITH DIFFERENTIAL/PLATELET
Abs Immature Granulocytes: 0.11 10*3/uL — ABNORMAL HIGH (ref 0.00–0.07)
Basophils Absolute: 0.1 10*3/uL (ref 0.0–0.1)
Basophils Relative: 1 %
Eosinophils Absolute: 0.1 10*3/uL (ref 0.0–0.5)
Eosinophils Relative: 2 %
HCT: 38.8 % (ref 36.0–46.0)
Hemoglobin: 12.8 g/dL (ref 12.0–15.0)
Immature Granulocytes: 1 %
Lymphocytes Relative: 27 %
Lymphs Abs: 2.5 10*3/uL (ref 0.7–4.0)
MCH: 33.8 pg (ref 26.0–34.0)
MCHC: 33 g/dL (ref 30.0–36.0)
MCV: 102.4 fL — ABNORMAL HIGH (ref 80.0–100.0)
Monocytes Absolute: 0.9 10*3/uL (ref 0.1–1.0)
Monocytes Relative: 10 %
Neutro Abs: 5.7 10*3/uL (ref 1.7–7.7)
Neutrophils Relative %: 59 %
Platelets: 297 10*3/uL (ref 150–400)
RBC: 3.79 MIL/uL — ABNORMAL LOW (ref 3.87–5.11)
RDW: 12.9 % (ref 11.5–15.5)
WBC: 9.4 10*3/uL (ref 4.0–10.5)
nRBC: 0 % (ref 0.0–0.2)

## 2021-01-22 LAB — MAGNESIUM: Magnesium: 2.2 mg/dL (ref 1.7–2.4)

## 2021-01-22 MED ORDER — HEPARIN SOD (PORK) LOCK FLUSH 100 UNIT/ML IV SOLN
500.0000 [IU] | Freq: Once | INTRAVENOUS | Status: AC
  Administered 2021-01-22: 500 [IU] via INTRAVENOUS

## 2021-01-22 MED ORDER — SODIUM CHLORIDE 0.9% FLUSH
10.0000 mL | Freq: Once | INTRAVENOUS | Status: AC
  Administered 2021-01-22: 10 mL

## 2021-01-22 NOTE — Patient Instructions (Signed)
[facility]  Discharge Instructions: Thank you for choosing [facility] to provide your oncology and hematology care.  If you have a lab appointment with the [facility], please come in thru the Main Entrance and check in at the main information desk.  Wear comfortable clothing and clothing appropriate for easy access to any Portacath or PICC line.   We strive to give you quality time with your provider. You may need to reschedule your appointment if you arrive late (15 or more minutes).  Arriving late affects you and other patients whose appointments are after yours.  Also, if you miss three or more appointments without notifying the office, you may be dismissed from the clinic at the provider's discretion.      For prescription refill requests, have your pharmacy contact our office and allow 72 hours for refills to be completed.    Today you received lab draw and port flush.      BELOW ARE SYMPTOMS THAT SHOULD BE REPORTED IMMEDIATELY: *FEVER GREATER THAN 100.4 F (38 C) OR HIGHER *CHILLS OR SWEATING *NAUSEA AND VOMITING THAT IS NOT CONTROLLED WITH YOUR NAUSEA MEDICATION *UNUSUAL SHORTNESS OF BREATH *UNUSUAL BRUISING OR BLEEDING *URINARY PROBLEMS (pain or burning when urinating, or frequent urination) *BOWEL PROBLEMS (unusual diarrhea, constipation, pain near the anus) TENDERNESS IN MOUTH AND THROAT WITH OR WITHOUT PRESENCE OF ULCERS (sore throat, sores in mouth, or a toothache) UNUSUAL RASH, SWELLING OR PAIN  UNUSUAL VAGINAL DISCHARGE OR ITCHING   Items with * indicate a potential emergency and should be followed up as soon as possible or go to the Emergency Department if any problems should occur.  Please show the CHEMOTHERAPY ALERT CARD or IMMUNOTHERAPY ALERT CARD at check-in to the Emergency Department and triage nurse.  Should you have questions after your visit or need to cancel or reschedule your appointment, please contact [facility]  [phone]  and follow the prompts.  Office hours are 8:00 a.m. to 4:30 p.m. Monday - Friday. Please note that voicemails left after 4:00 p.m. may not be returned until the following business day.  We are closed weekends and major holidays. You have access to a nurse at all times for urgent questions. Please call the main number to the clinic [phone] and follow the prompts.  For any non-urgent questions, you may also contact your provider using MyChart. We now offer e-Visits for anyone 71 and older to request care online for non-urgent symptoms. For details visit [email or web address].   Also download the MyChart app! Go to the app store, search "MyChart", open the app, select [facility], and log in with your MyChart username and password.  Due to Covid, a mask is required upon entering the hospital/clinic. If you do not have a mask, one will be given to you upon arrival. For doctor visits, patients may have 1 support person aged 70 or older with them. For treatment visits, patients cannot have anyone with them due to current Covid guidelines and our immunocompromised population.

## 2021-01-22 NOTE — Progress Notes (Signed)
Alba Destine presented for [employer] and flush.  Portacath located  on the Right chest wall accessed with  H 20 needle.  Good blood return present. Portacath flushed with 10 ml NS and 500U/102m Heparin and needle removed intact.  Procedure tolerated well and without incident.     Vital signs stable. No complaints at this time. Discharged from clinic ambulatory in stable condition. Alert and oriented x 3. [facility]

## 2021-01-23 LAB — CA 125: Cancer Antigen (CA) 125: 38.8 U/mL — ABNORMAL HIGH (ref 0.0–38.1)

## 2021-01-28 ENCOUNTER — Encounter (HOSPITAL_COMMUNITY): Payer: Self-pay | Admitting: Nurse Practitioner

## 2021-01-28 ENCOUNTER — Ambulatory Visit (HOSPITAL_COMMUNITY): Payer: Medicare Other | Admitting: Hematology

## 2021-01-28 ENCOUNTER — Inpatient Hospital Stay (HOSPITAL_BASED_OUTPATIENT_CLINIC_OR_DEPARTMENT_OTHER): Payer: Medicare Other | Admitting: Nurse Practitioner

## 2021-01-28 VITALS — BP 132/65 | HR 65 | Temp 98.8°F | Resp 16 | Wt 172.2 lb

## 2021-01-28 DIAGNOSIS — C561 Malignant neoplasm of right ovary: Secondary | ICD-10-CM | POA: Diagnosis not present

## 2021-01-28 NOTE — Patient Instructions (Signed)
As we discussed, you may notice some improvement in your symptoms with acupuncture. Below is the names of some local clinics that offer our cancer survivors a discounted rate of $40/session. You can contact them for an appointment at your convenience.  - [facility] and [facility]) or [phone] [facility])

## 2021-01-28 NOTE — Progress Notes (Signed)
[place], [place] [address]   CLINIC:  Medical Oncology/Hematology  PCP:  Manon Hilding, MD [address] [place] [address] [phone]  Virtual Visit Progress Note  I connected with April Guerra on 01/28/21 at  1:20 PM EDT by video enabled telemedicine visit and verified that I am speaking with the correct person using two identifiers.   I discussed the limitations, risks, security and privacy concerns of performing an evaluation and management service by telemedicine and the availability of in-person appointments. I also discussed with the patient that there may be a patient responsible charge related to this service. The patient expressed understanding and agreed to proceed.   Other persons participating in the visit and their role in the encounter: RN, NP, Patient   Patient's location: [place] location: [place]   Chief Complaint: Ovarian cancer Surveillance  REASON FOR VISIT:  Follow-up for right ovarian cancer  PRIOR THERAPY:  1. TAH-BSO on 05/03/2020. 2. Carboplatin, paclitaxel & Aloxi x 6 cycles from 06/06/2020 to 09/19/2020.  NGS Results: Not done  CURRENT THERAPY: Surveillance  BRIEF ONCOLOGIC HISTORY:  Oncology History  Right ovarian epithelial cancer ([place])  05/03/2020 Initial Diagnosis   Right ovarian epithelial cancer ([place])   05/03/2020 Cancer Staging   Staging form: Ovary, Fallopian Tube, and Primary Peritoneal Carcinoma, AJCC 8th Edition - Clinical stage from 05/03/2020: FIGO Stage II, calculated as Stage Unknown (cT2b, cNX, cM0) - Signed by Everitt Amber, MD on 05/24/2020   06/06/2020 -  Chemotherapy    Patient is on Treatment Plan: OVARIAN CARBOPLATIN (AUC 6) / PACLITAXEL (175) Q21D X 6 CYCLES       Genetic Testing   Negative genetic testing. No pathogenic variants identified on the [employer]. The report date is 08/10/2020.  The CancerNext gene panel  offered by [employer] includes sequencing and rearrangement analysis for the following 36 genes: APC*, ATM*, AXIN2, BARD1, BMPR1A, BRCA1*, BRCA2*, BRIP1*, CDH1*, CDK4, CDKN2A, CHEK2*, DICER1, MLH1*, MSH2*, MSH3, MSH6*, MUTYH*, NBN, NF1*, NTHL1, PALB2*, PMS2*, PTEN*, RAD51C*, RAD51D*, RECQL, SMAD4, SMARCA4, STK11 and TP53* (sequencing and deletion/duplication); HOXB13, POLD1 and POLE (sequencing only); EPCAM and GREM1 (deletion/duplication only).   Somatic genes analyzed through TumorNext-HRD: ATM, BARD1, BRCA1, BRCA2, BRIP1, CHEK2, MRE11A, NBN, PALB2, RAD51C, RAD51D.     CANCER STAGING: Cancer Staging Right ovarian epithelial cancer [facility]) Staging form: Ovary, Fallopian Tube, and Primary Peritoneal Carcinoma, AJCC 8th Edition - Clinical stage from 05/03/2020: FIGO Stage II, calculated as Stage Unknown (cT2b, cNX, cM0) - Signed by Everitt Amber, MD on 05/24/2020   INTERVAL HISTORY:  April Guerra, a 75 y.o. female, who returns to clinic for routine follow-up for history of ovarian cancer. Has chronic neuropathy. On gabapentin. Denies any neurologic complaints. Denies recent fevers or illnesses. Denies any easy bleeding or bruising. No melena or hematochezia. No vaginal bleeding, discharge. No changes in bowel movements. Reports good appetite and denies weight loss. Denies chest pain. Denies any nausea, vomiting, constipation, or diarrhea. Denies urinary complaints. Patient offers no further specific complaints today. She hasn't had a pelvic exam.   REVIEW OF SYSTEMS:  Review of Systems  Constitutional:  Negative for appetite change, fatigue and unexpected weight change.  HENT:   Negative for mouth sores, sore throat and trouble swallowing.   Respiratory:  Negative for chest tightness and shortness of breath.   Cardiovascular:  Negative for leg swelling.  Gastrointestinal:  Negative for abdominal  pain, constipation, diarrhea, nausea and vomiting.  Genitourinary:  Negative for bladder  incontinence and dysuria.   Musculoskeletal:  Negative for flank pain and neck stiffness.  Skin:  Negative for itching, rash and wound.  Neurological:  Negative for dizziness, headaches, light-headedness and numbness.  Psychiatric/Behavioral:  Negative for confusion, depression and sleep disturbance. The patient is not nervous/anxious.    PAST MEDICAL/SURGICAL HISTORY:  Past Medical History:  Diagnosis Date   A-fib ([place])    Anxiety    Arthritis    Asthma    hx of    Cancer ([place])    ovarian   Dyspnea    with exertion    H/O seasonal allergies    Heart murmur    as aa child    Hypercholesteremia    Hypertension    Spinal stenosis    Past Surgical History:  Procedure Laterality Date   ABDOMINAL HYSTERECTOMY     BIOPSY  05/08/2020   Procedure: BIOPSY;  Surgeon: Harvel Quale, MD;  [facility]: [facility];  Service: Gastroenterology;;   BREAST SURGERY     CARDIAC CATHETERIZATION     CHOLECYSTECTOMY     COLONOSCOPY N/A 08/14/2015   Procedure: COLONOSCOPY;  Surgeon: Aviva Signs, MD;  Location: [facility];  Service: Gastroenterology;  Laterality: N/A;   ESOPHAGOGASTRODUODENOSCOPY (EGD) WITH PROPOFOL N/A 05/08/2020   Procedure: ESOPHAGOGASTRODUODENOSCOPY (EGD) WITH PROPOFOL;  Surgeon: Harvel Quale, MD;  [facility];  Service: Gastroenterology;  Laterality: N/A;   ESOPHAGOGASTRODUODENOSCOPY (EGD) WITH PROPOFOL N/A 08/07/2020   Procedure: ESOPHAGOGASTRODUODENOSCOPY (EGD) WITH PROPOFOL;  Surgeon: Harvel Quale, MD;  Location: [facility];  Service: Gastroenterology;  Laterality: N/A;  7:30   IR IMAGING GUIDED PORT INSERTION  06/04/2020   ROBOTIC ASSISTED TOTAL HYSTERECTOMY WITH BILATERAL SALPINGO OOPHERECTOMY N/A 05/03/2020   Procedure: XI ROBOTIC ASSISTED TOTAL HYSTERECTOMY WITH BILATERAL SALPINGO OOPHORECTOMY, OMENTECTOMY,RADICAL TUMOR DEBULKING, RIGID PROSTOSCOPY;  Surgeon: Everitt Amber, MD;  Location: [facility];  Service: Gynecology;   Laterality: N/A;   THROMBECTOMY FEMORAL ARTERY Right 12/17/2013   Procedure: REPAIR OF RIGHT FEMORAL ARTERY PSEUDOANEURYSM;  Surgeon: Elam Dutch, MD;  Location: [facility] OR;  Service: Vascular;  Laterality: Right;    SOCIAL HISTORY:  Social History   Socioeconomic History   Marital status: Married    Spouse name: Not on file   Number of children: 4   Years of education: Not on file   Highest education level: Not on file  Occupational History   Occupation: retired Marine scientist  Tobacco Use   Smoking status: Never   Smokeless tobacco: Never  Vaping Use   Vaping Use: Never used  Substance and Sexual Activity   Alcohol use: No   Drug use: No   Sexual activity: Not Currently  Other Topics Concern   Not on file  Social History Narrative   Not on file   Social Determinants of Health   Financial Resource Strain: Low Risk    Difficulty of Paying Living Expenses: Not hard at all  Food Insecurity: No Food Insecurity   Worried About Charity fundraiser in the Last Year: Never true   [place] in the Last Year: Never true  Transportation Needs: No Transportation Needs   Lack of Transportation (Medical): No   Lack of Transportation (Non-Medical): No  Physical Activity: Insufficiently Active   Days of Exercise per Week: 7 days   Minutes of Exercise per Session: 10 min  Stress: No Stress Concern Present   Feeling  of Stress : Not at all  Social Connections: Moderately Integrated   Frequency of Communication with Friends and Family: More than three times a week   Frequency of Social Gatherings with Friends and Family: More than three times a week   Attends Religious Services: More than 4 times per year   Active Member of [employer] or Organizations: No   Attends Music therapist: Never   Marital Status: Married  Human resources officer Violence: Not At Risk   Fear of Current or Ex-Partner: No   Emotionally Abused: No   Physically Abused: No   Sexually Abused: No    FAMILY  HISTORY:  Family History  Problem Relation Age of Onset   Hypertension Mother    Heart failure Mother    Asthma Mother    Ulcers Mother    Benign prostatic hyperplasia Father    Ulcers Father    Esophageal varices Father    Atrial fibrillation Sister    Atrial fibrillation Brother    Heart disease Brother    Melanoma Maternal Aunt    Colon cancer Neg Hx     CURRENT MEDICATIONS:  Current Outpatient Medications  Medication Sig Dispense Refill   acetaminophen (TYLENOL) 500 MG tablet Take 1,000 mg by mouth 3 (three) times daily.     albuterol (VENTOLIN HFA) 108 (90 Base) MCG/ACT inhaler Inhale 2 puffs into the lungs every 4 (four) hours as needed for wheezing or shortness of breath. 18 g 2   amLODipine (NORVASC) 10 MG tablet Take 1 tablet (10 mg total) by mouth daily with lunch. For BP 30 tablet 3   aspirin EC 81 MG tablet Take 81 mg by mouth daily. Swallow whole.     Calcium Carb-Cholecalciferol (CALCIUM 600/VITAMIN D PO) Take 600 mg by mouth in the morning and at bedtime.     CARBOPLATIN IV Inject 1 application into the vein every 21 ( twenty-one) days.     cetirizine (ZYRTEC) 10 MG tablet Take 10 mg by mouth daily.     Cholecalciferol (VITAMIN D) 125 MCG (5000 UT) CAPS Take 5,000 Units by mouth daily.     Coenzyme Q10 (COQ10) 100 MG CAPS Take 100 mg by mouth at bedtime.      cyclobenzaprine (FLEXERIL) 10 MG tablet Take 5 mg by mouth See admin instructions. Take 5 mg at night, may take a second 5 mg dose during the day as needed for muscle spasms     dexamethasone (DECADRON) 4 MG tablet Take 1 tablet (4 mg total) by mouth daily. Start the day after carboplatin chemotherapy for 3 days. (Patient taking differently: Take 4 mg by mouth See admin instructions. Start the day after carboplatin chemotherapy for 3 days.) 20 tablet 0   diltiazem (CARDIZEM) 30 MG tablet Take 1 tablet every 4 hours AS NEEDED for AFIB heart rate >100 (Patient taking differently: Take 30 mg by mouth every 4 (four)  hours as needed (AFIB heart rate > 100).) 30 tablet 3   fluticasone (FLONASE) 50 MCG/ACT nasal spray Place 1 spray into both nostrils daily.      gabapentin (NEURONTIN) 300 MG capsule Take 1 capsule (300 mg total) by mouth 3 (three) times daily. 90 capsule 2   lidocaine-prilocaine (EMLA) cream Apply to affected area once (Patient not taking: Reported on 10/24/2020) 30 g 3   lisinopril (ZESTRIL) 30 MG tablet Take 1 tablet (30 mg total) by mouth in the morning. Okay to restart around Monday, 05/14/2020 if blood pressure stable (Patient taking differently:  Take 30 mg by mouth in the morning.) 30 tablet 3   Magnesium 100 MG TABS Take 50 mg by mouth at bedtime.     melatonin 5 MG TABS Take 2.5 mg by mouth at bedtime as needed (Sleep).     metoprolol succinate (TOPROL-XL) 100 MG 24 hr tablet Take 100 mg by mouth in the morning. Take with or immediately following a meal.     Multiple Vitamins-Minerals (CENTRUM SILVER ADULT 50+ PO) Take 1 tablet by mouth daily.     Omega-3 Fatty Acids (FISH OIL) 1000 MG CAPS Take 1,000 mg by mouth daily.     ondansetron (ZOFRAN) 4 MG tablet Take 1 tablet (4 mg total) by mouth every 6 (six) hours as needed for nausea. 20 tablet 0   PACLITAXEL IV Inject 1 application into the vein every 21 ( twenty-one) days.     pantoprazole (PROTONIX) 40 MG tablet Take 1 tablet (40 mg total) by mouth daily. 90 tablet 3   polyethylene glycol powder (GLYCOLAX/MIRALAX) 17 GM/SCOOP powder Take 17 g by mouth daily as needed for mild constipation or moderate constipation (With lunch).     predniSONE (DELTASONE) 20 MG tablet Take by mouth.     prochlorperazine (COMPAZINE) 10 MG tablet Take 1 tablet (10 mg total) by mouth every 6 (six) hours as needed (Nausea or vomiting). 30 tablet 1   senna (SENOKOT) 8.6 MG tablet Take 1 tablet by mouth daily as needed for constipation.     simvastatin (ZOCOR) 40 MG tablet Take 0.5 tablets (20 mg total) by mouth daily. (Patient taking differently: Take 40 mg by  mouth at bedtime.) 30 tablet 2   sodium chloride (OCEAN) 0.65 % SOLN nasal spray Place 1 spray into both nostrils See admin instructions. Use 1 spray in each nostril in the morning may use a second dose at night as needed for congestion     tetrahydrozoline-zinc (VISINE-AC) 0.05-0.25 % ophthalmic solution Place 1 drop into both eyes daily.     triamcinolone (KENALOG) 0.1 % Apply 1 application topically daily as needed (irritation).     vitamin C (ASCORBIC ACID) 500 MG tablet Take 500 mg by mouth daily.     Vitamins A & D (VITAMIN A & D) ointment Apply 1 application topically daily as needed for dry skin (irritation).     No current facility-administered medications for this visit.    ALLERGIES:  Allergies  Allergen Reactions   Morphine And Related Nausea And Vomiting   Zofran [Ondansetron]     Causes minor constipation, tolerates low doses      PHYSICAL EXAM:  Performance status (ECOG): 1 - Symptomatic but completely ambulatory  Vitals:   01/28/21 1352  BP: 132/65  Pulse: 65  Resp: 16  Temp: 98.8 F (37.1 C)  SpO2: 98%   Wt Readings from Last 3 Encounters:  10/24/20 168 lb (76.2 kg)  09/21/20 167 lb 6.4 oz (75.9 kg)  09/19/20 167 lb 12.8 oz (76.1 kg)   Physical Exam Constitutional:      General: She is not in acute distress. HENT:     Head: Normocephalic.  Pulmonary:     Effort: No respiratory distress.  Neurological:     Mental Status: She is alert and oriented to person, place, and time.  Psychiatric:        Mood and Affect: Mood normal.        Behavior: Behavior normal.     LABORATORY DATA:  I have reviewed the labs as listed.  CBC Latest Ref Rng & Units 01/22/2021 10/16/2020 09/19/2020  WBC 4.0 - 10.5 K/uL 9.4 7.3 6.3  Hemoglobin 12.0 - 15.0 g/dL 12.8 10.7(L) 11.3(L)  Hematocrit 36.0 - 46.0 % 38.8 31.8(L) 33.8(L)  Platelets 150 - 400 K/uL 297 273 248   CMP Latest Ref Rng & Units 01/22/2021 10/16/2020 09/19/2020  Glucose 70 - 99 mg/dL 92 91 87  BUN 8 - 23 mg/dL  19 10 17   Creatinine 0.44 - 1.00 mg/dL 0.71 0.44 0.55  Sodium 135 - 145 mmol/L 135 132(L) 135  Potassium 3.5 - 5.1 mmol/L 4.0 3.5 3.7  Chloride 98 - 111 mmol/L 101 100 101  CO2 22 - 32 mmol/L 27 23 24   Calcium 8.9 - 10.3 mg/dL 9.2 9.0 9.6  Total Protein 6.5 - 8.1 g/dL 6.9 6.7 7.1  Total Bilirubin 0.3 - 1.2 mg/dL 0.9 0.5 0.5  Alkaline Phos 38 - 126 U/L 45 57 67  AST 15 - 41 U/L 19 24 21   ALT 0 - 44 U/L 22 19 18     DIAGNOSTIC IMAGING:  No results found.    ASSESSMENT & PLAN:   1.  Stage II clear cell right ovarian cancer -Status post robotic assisted total hysterectomy, BSO, omentectomy and staging on 05/03/2020. -[id number] on 04/20/2020 -Evaluated by Dr. Denman George on 05/24/2020. -Somatic and germline mutation testing was negative. -6 cycles of carboplatin and paclitaxel from 06/06/2020 through 09/19/2020. - CA 125 elevated at diagnosis. Now 38.8. Elevated but improved. Clinically she is asymptomatic. CT imaging from 10/16/20 was neagative.  - Discussed surveillance guidelines including history and pelvic exam every 3 months for the first 2-3 years then every 6 months through year 5 then annually thereafter.   2.  Atrial fibrillation: -Was on Eliquis. -She had postoperative GI bleed when Eliquis was discontinued.  3. Peripheral neuropathy: - chemo was dose reduced d/t  - Continue gabapentin 300 mg 3 times a day. - referral for acupuncture  Return to clinic  3 months- lab (ca125). Day to week later, see MD Contact Dr. Serita Grit office for pelvic exam   I discussed the assessment and treatment plan with the patient. The patient was provided an opportunity to ask questions and all were answered. The patient agreed with the plan and demonstrated an understanding of the instructions.  The patient was advised to call back or seek an in-person evaluation if the symptoms worsen or if the condition fails to improve as anticipated.  I spent 20 minutes face-to-face video visit time dedicated  to the care of this patient on the date of this encounter to include pre-visit review of medical oncology notes, gyn-onc note, imaging (ct from 10/16/20), labs from 01/2021, face-to-face time with the patient, and post visit ordering of testing/documentation.    Orders placed this encounter:  No orders of the defined types were placed in this encounter.  Beckey Rutter, DNP, AGNP-C [place] [phone]

## 2021-02-15 ENCOUNTER — Other Ambulatory Visit: Payer: Self-pay

## 2021-02-15 ENCOUNTER — Encounter: Payer: Self-pay | Admitting: Gynecologic Oncology

## 2021-02-15 ENCOUNTER — Inpatient Hospital Stay: Payer: Medicare Other | Attending: Gynecologic Oncology | Admitting: Gynecologic Oncology

## 2021-02-15 VITALS — BP 156/70 | HR 74 | Temp 98.8°F | Resp 18 | Ht <= 58 in | Wt 170.0 lb

## 2021-02-15 DIAGNOSIS — Z8543 Personal history of malignant neoplasm of ovary: Secondary | ICD-10-CM | POA: Diagnosis present

## 2021-02-15 DIAGNOSIS — Z9221 Personal history of antineoplastic chemotherapy: Secondary | ICD-10-CM | POA: Insufficient documentation

## 2021-02-15 DIAGNOSIS — Z90722 Acquired absence of ovaries, bilateral: Secondary | ICD-10-CM | POA: Diagnosis not present

## 2021-02-15 DIAGNOSIS — Z90710 Acquired absence of both cervix and uterus: Secondary | ICD-10-CM | POA: Diagnosis not present

## 2021-02-15 DIAGNOSIS — C561 Malignant neoplasm of right ovary: Secondary | ICD-10-CM

## 2021-02-15 NOTE — Progress Notes (Signed)
Follow-up Note: Gyn-Onc  Consult was requested by Dr. Jeffie Pollock for the evaluation of April Guerra 75 y.o. female  CC:  Chief Complaint  Patient presents with   Right ovarian epithelial cancer [facility])    Assessment/Plan:  April Guerra  is a 75 y.o.  year old with a history of stage II clear cell ovarian cancer s/p robotic assisted total hysterectomy, BSO, omentectomy, staging on 05/03/20. BRCA/HRD negative. S/p adjuvant carb/tax x 6 completed 09/19/20.  Complete clinical response (upper limit normal but stable CA125, negative scans, negative exam).  Recommend continuing 3 monthly surveillance exams and Ca1 25 assessments until least 2 years post treatment completion (March 2024.  I explained to the patient that I will be leaving the practice, however my partner, Dr. Berline Lopes, will be able to assume follow-up in conjunction with Dr. Delton Coombes.  She will be seen by Dr Raliegh Ip again this year, and then with my partner in February, 2023.   HPI: April Guerra is a 75 year old P4 who was seen in consultation at the request of Dr Jeffie Pollock for evaluation of a complex ovarian mass and postmenopausal bleeding.  The patient reported approximately 21-monthhistory of progressive urinary frequency.  This was initially worked up by testing and treating empirically for urinary tract infections.  When that failed to resolve her symptoms she was referred to a urologist, Dr. WJeffie Pollock who performed a pelvic examination which palpated a mass in the pelvis.  Due to concern that her urinary frequency symptoms were secondary to extrinsic compression a CT scan of the abdomen and pelvis was ordered.  Of note her urinary analysis was fairly unremarkable.  [facility] showed a 14.5 x 14 x 12 cm solid and cystic right ovarian mass worrisome for ovarian carcinoma.  There were no findings for intraperitoneal metastatic disease.  There is no lymphadenopathy present.  A  transvaginal ultrasound on 04/12/2020 confirmed this finding of an ovarian mass.  The uterus itself measured 7.2 x 3.2 x 4.6 cm with a 6 mm endometrium.  The right ovary was not visualized.  The left ovary was not visualized.  There was a large complex cystic mass filling the pelvis measuring 14.5 x 10.9 x 13.4 cm.  The majority of this was a complicated cystic locule.  However inferiorly was complex solid and cystic intracystic mass identified estimated to be 5.7 x 6.5 x 5 cm with multiple septations.  There was blood flow within the mural nodule.  CA 125 tumor marker was elevated at 114 on 04/20/20.  On 05/03/2020 she underwent a robotic assisted total hysterectomy with bilateral salpingo-oophorectomy, omentectomy, radical tumor debulking, and omentectomy.  Intraoperative findings were significant for a grossly normal-appearing 7 cm uterus and grossly normal-appearing left tube and ovary.  The right ovary was replaced by 15 cm cystic and solid mass which was solid at its inferior aspect and densely adherent and infiltrating into the right uterosacral ligament and right parametrial tissues.  The rectum was adherent to the posterior uterus and cervix in the midline.  There was no gross upper abdominal disease.  There was trace ascites that was amber in color.  There was unavoidable cyst ruptured during the procedure due to its infiltrative nature.  Diaphragms were smooth.  No gross abnormalities was seen in the intestines.  There was no gross residual tumor at the completion of the procedure representing a complete, R0 resection.  Final pathology revealed a clear cell carcinoma  of the right ovary measuring 13.2 cm.  The carcinoma was confined to the ovary without involvement of the ovarian surface.  The left ovary tube and uterus were all benign.  The omentum was benign as were the peritoneal biopsies for staging.  Peritoneal washings were negative.  Due to the infiltrative nature of the tumor into the pelvic  sidewall she was determined to have a stage II cancer, grade 3 clear cell.  Due to the locally infiltrative nature of the tumor and its high-grade she was recommended to have adjuvant chemotherapy for 6 cycles of carboplatin and paclitaxel in accordance with NCCN guidelines.  Germline and somatic testing for HRD and BRCA was negative.   Interval Hx:  She went on to receive 6 cycles of carboplatin and paclitaxel chemotherapy with Dr Delton Coombes at [facility] between 06/06/20 and 09/19/20. She tolerated therapy well with toxicity of neuropathy persistent.  CA 125 postoperatively and pre-treatment was 46.5 on 08/08/20.  After completion of therapy it was 41 on 09/19/20.  Post-treatment CT abd/pelvis 10/16/2020 revealed no evidence of recurrent or metastatic disease.  There is minimal increased density at the vaginal apex that was felt to likely reflect postoperative changes with no measurable soft tissue in this location.  They recommended attention on follow-up.  CA 125 on 10/16/20 was normal at 40.9. CA 125 on 01/22/21 was normal at 38.8.  She had no symptoms concerning for recurrence.  Current Meds:  Outpatient Encounter Medications as of 02/15/2021  Medication Sig   acetaminophen (TYLENOL) 500 MG tablet Take 1,000 mg by mouth 3 (three) times daily.   albuterol (VENTOLIN HFA) 108 (90 Base) MCG/ACT inhaler Inhale 2 puffs into the lungs every 4 (four) hours as needed for wheezing or shortness of breath.   amLODipine (NORVASC) 10 MG tablet Take 1 tablet (10 mg total) by mouth daily with lunch. For BP   aspirin EC 81 MG tablet Take 81 mg by mouth daily. Swallow whole.   b complex vitamins capsule Take 1 capsule by mouth at bedtime.   Calcium Carb-Cholecalciferol (CALCIUM 600/VITAMIN D PO) Take 600 mg by mouth in the morning and at bedtime.   cetirizine (ZYRTEC) 10 MG tablet Take 10 mg by mouth daily.   Cholecalciferol (VITAMIN D) 125 MCG (5000 UT) CAPS Take 5,000 Units by mouth daily.   Coenzyme Q10  (COQ10) 100 MG CAPS Take 100 mg by mouth at bedtime.    cyclobenzaprine (FLEXERIL) 10 MG tablet Take 5 mg by mouth See admin instructions. Take 5 mg at night, may take a second 5 mg dose during the day as needed for muscle spasms   diltiazem (CARDIZEM) 30 MG tablet Take 1 tablet every 4 hours AS NEEDED for AFIB heart rate >100 (Patient taking differently: Take 30 mg by mouth every 4 (four) hours as needed (AFIB heart rate > 100).)   fluticasone (FLONASE) 50 MCG/ACT nasal spray Place 1 spray into both nostrils daily.    gabapentin (NEURONTIN) 300 MG capsule Take 1 capsule (300 mg total) by mouth 3 (three) times daily. (Patient taking differently: Take 300 mg by mouth 3 (three) times daily. Pt takes 1 in the morning, 1 at lunch, 2 at night)   lidocaine-prilocaine (EMLA) cream Apply to affected area once   lisinopril (ZESTRIL) 30 MG tablet Take 1 tablet (30 mg total) by mouth in the morning. Okay to restart around Monday, 05/14/2020 if blood pressure stable (Patient taking differently: Take 30 mg by mouth in the morning.)   Magnesium 100  MG TABS Take 50 mg by mouth at bedtime.   melatonin 5 MG TABS Take 2.5 mg by mouth at bedtime as needed (Sleep).   metoprolol succinate (TOPROL-XL) 100 MG 24 hr tablet Take 100 mg by mouth in the morning. Take with or immediately following a meal.   Multiple Vitamins-Minerals (CENTRUM SILVER ADULT 50+ PO) Take 1 tablet by mouth daily.   Omega-3 Fatty Acids (FISH OIL) 1000 MG CAPS Take 1,000 mg by mouth daily.   ondansetron (ZOFRAN) 4 MG tablet Take 1 tablet (4 mg total) by mouth every 6 (six) hours as needed for nausea.   pantoprazole (PROTONIX) 40 MG tablet Take 1 tablet (40 mg total) by mouth daily.   polyethylene glycol powder (GLYCOLAX/MIRALAX) 17 GM/SCOOP powder Take 17 g by mouth daily as needed for mild constipation or moderate constipation (With lunch).   prochlorperazine (COMPAZINE) 10 MG tablet Take 1 tablet (10 mg total) by mouth every 6 (six) hours as needed  (Nausea or vomiting).   senna (SENOKOT) 8.6 MG tablet Take 1 tablet by mouth daily as needed for constipation.   simvastatin (ZOCOR) 40 MG tablet Take 0.5 tablets (20 mg total) by mouth daily. (Patient taking differently: Take 40 mg by mouth at bedtime.)   sodium chloride (OCEAN) 0.65 % SOLN nasal spray Place 1 spray into both nostrils See admin instructions. Use 1 spray in each nostril in the morning may use a second dose at night as needed for congestion   tetrahydrozoline-zinc (VISINE-AC) 0.05-0.25 % ophthalmic solution Place 1 drop into both eyes daily.   triamcinolone (KENALOG) 0.1 % Apply 1 application topically daily as needed (irritation).   vitamin C (ASCORBIC ACID) 500 MG tablet Take 1,000 mg by mouth 2 (two) times daily.   Vitamins A & D (VITAMIN A & D) ointment Apply 1 application topically daily as needed for dry skin (irritation).   No facility-administered encounter medications on file as of 02/15/2021.    Allergy:  Allergies  Allergen Reactions   Morphine And Related Nausea And Vomiting   Zofran [Ondansetron]     Causes minor constipation, tolerates low doses      Social Hx:   Social History   Socioeconomic History   Marital status: Married    Spouse name: Not on file   Number of children: 4   Years of education: Not on file   Highest education level: Not on file  Occupational History   Occupation: retired Marine scientist  Tobacco Use   Smoking status: Never   Smokeless tobacco: Never  Vaping Use   Vaping Use: Never used  Substance and Sexual Activity   Alcohol use: No   Drug use: No   Sexual activity: Not Currently  Other Topics Concern   Not on file  Social History Narrative   Not on file   Social Determinants of Health   Financial Resource Strain: Low Risk    Difficulty of Paying Living Expenses: Not hard at all  Food Insecurity: No Food Insecurity   Worried About Charity fundraiser in the Last Year: Never true   Ran Out of Food in the Last Year: Never true   Transportation Needs: No Transportation Needs   Lack of Transportation (Medical): No   Lack of Transportation (Non-Medical): No  Physical Activity: Insufficiently Active   Days of Exercise per Week: 7 days   Minutes of Exercise per Session: 10 min  Stress: No Stress Concern Present   Feeling of Stress : Not at all  Social  Connections: Moderately Integrated   Frequency of Communication with Friends and Family: More than three times a week   Frequency of Social Gatherings with Friends and Family: More than three times a week   Attends Religious Services: More than 4 times per year   Active Member of Clubs or Organizations: No   Attends Archivist Meetings: Never   Marital Status: Married  Human resources officer Violence: Not At Risk   Fear of Current or Ex-Partner: No   Emotionally Abused: No   Physically Abused: No   Sexually Abused: No    Past Surgical Hx:  Past Surgical History:  Procedure Laterality Date   ABDOMINAL HYSTERECTOMY     BIOPSY  05/08/2020   Procedure: BIOPSY;  Surgeon: Harvel Quale, MD;  [facility]: [facility];  Service: Gastroenterology;;   BREAST SURGERY     CARDIAC CATHETERIZATION     CHOLECYSTECTOMY     COLONOSCOPY N/A 08/14/2015   Procedure: COLONOSCOPY;  Surgeon: Aviva Signs, MD;  Location: [facility];  Service: Gastroenterology;  Laterality: N/A;   ESOPHAGOGASTRODUODENOSCOPY (EGD) WITH PROPOFOL N/A 05/08/2020   Procedure: ESOPHAGOGASTRODUODENOSCOPY (EGD) WITH PROPOFOL;  Surgeon: Harvel Quale, MD;  [facility];  Service: Gastroenterology;  Laterality: N/A;   ESOPHAGOGASTRODUODENOSCOPY (EGD) WITH PROPOFOL N/A 08/07/2020   Procedure: ESOPHAGOGASTRODUODENOSCOPY (EGD) WITH PROPOFOL;  Surgeon: Harvel Quale, MD;  [facility];  Service: Gastroenterology;  Laterality: N/A;  7:30   IR IMAGING GUIDED PORT INSERTION  06/04/2020   ROBOTIC ASSISTED TOTAL HYSTERECTOMY WITH BILATERAL SALPINGO  OOPHERECTOMY N/A 05/03/2020   Procedure: XI ROBOTIC ASSISTED TOTAL HYSTERECTOMY WITH BILATERAL SALPINGO OOPHORECTOMY, OMENTECTOMY,RADICAL TUMOR DEBULKING, RIGID PROSTOSCOPY;  Surgeon: Everitt Amber, MD;  Location: [facility];  Service: Gynecology;  Laterality: N/A;   THROMBECTOMY FEMORAL ARTERY Right 12/17/2013   Procedure: REPAIR OF RIGHT FEMORAL ARTERY PSEUDOANEURYSM;  Surgeon: Elam Dutch, MD;  Location: [place];  Service: Vascular;  Laterality: Right;    Past Medical Hx:  Past Medical History:  Diagnosis Date   A-fib ([employer])    Anxiety    Arthritis    Asthma    hx of    Cancer ([place])    ovarian   Dyspnea    with exertion    H/O seasonal allergies    Heart murmur    as aa child    Hypercholesteremia    Hypertension    Spinal stenosis     Past Gynecological History:  See HPI (SVD x 4) No LMP recorded. Patient has had a hysterectomy.  Family Hx:  Family History  Problem Relation Age of Onset   Hypertension Mother    Heart failure Mother    Asthma Mother    Ulcers Mother    Benign prostatic hyperplasia Father    Ulcers Father    Esophageal varices Father    Atrial fibrillation Sister    Atrial fibrillation Brother    Heart disease Brother    Melanoma Maternal Aunt    Colon cancer Neg Hx     Review of Systems:  Constitutional  Feels well,   ENT Normal appearing ears and nares bilaterally Skin/Breast  No rash, sores, jaundice, itching, dryness Cardiovascular  No chest pain, shortness of breath, or edema  Pulmonary  No cough or wheeze.  Gastro Intestinal  No nausea, vomitting, or diarrhoea. No bright red blood per rectum, no abdominal pain, change in bowel movement, or constipation.  Genito Urinary  No frequency, urgency, dysuria, see HPI Musculo Skeletal  No myalgia, arthralgia, joint swelling or pain  Neurologic  No weakness, numbness, change in gait,  Psychology  No depression, anxiety, insomnia.   Vitals:  Blood pressure (!) 156/70, pulse 74, temperature  98.8 F (37.1 C), temperature source Oral, resp. rate 18, height 4' 10"  (1.473 m), weight 169 lb 15.6 oz (77.1 kg), SpO2 100 %.  Physical Exam: WD in NAD Neck  Supple NROM, without any enlargements.  Lymph Node Survey No cervical supraclavicular or inguinal adenopathy Cardiovascular  Pulse normal rate, regularity and rhythm. S1 and S2 normal.  Lungs  Clear to auscultation bilateraly, without wheezes/crackles/rhonchi. Good air movement.  Skin  No rash/lesions/breakdown  Psychiatry  Alert and oriented to person, place, and time  Abdomen  Normoactive bowel sounds, abdomen soft, non-tender and obese without evidence of hernia. Soft incisions, no palpable masses.  Back No CVA tenderness Genito Urinary  Vaginal cuff smooth, no palpable masses in pelvis.  Rectal  deferred Extremities  No bilateral cyanosis, clubbing or edema.  Thereasa Solo, MD  02/15/2021, 4:43 PM

## 2021-02-15 NOTE — Patient Instructions (Signed)
Dr Denman George recommends returning to see Dr Raliegh Ip in 3 months.  She recommends returning to the [facility] in [place] in February to see Dr Berline Lopes, Dr Serita Grit partner as Dr Denman George is leaving the practice in October.   Please have Dr Marthann Schiller team call [phone] to coordinate this appointment after your next visit with him.

## 2021-03-20 ENCOUNTER — Other Ambulatory Visit (HOSPITAL_COMMUNITY): Payer: Self-pay | Admitting: Hematology

## 2021-04-26 ENCOUNTER — Other Ambulatory Visit (HOSPITAL_COMMUNITY): Payer: Self-pay | Admitting: Family Medicine

## 2021-04-26 DIAGNOSIS — Z1231 Encounter for screening mammogram for malignant neoplasm of breast: Secondary | ICD-10-CM

## 2021-04-29 ENCOUNTER — Inpatient Hospital Stay (HOSPITAL_COMMUNITY): Payer: Medicare Other

## 2021-04-29 ENCOUNTER — Other Ambulatory Visit (HOSPITAL_COMMUNITY): Payer: Self-pay

## 2021-04-29 ENCOUNTER — Other Ambulatory Visit (HOSPITAL_COMMUNITY): Payer: Medicare Other

## 2021-04-29 DIAGNOSIS — C561 Malignant neoplasm of right ovary: Secondary | ICD-10-CM

## 2021-04-30 ENCOUNTER — Inpatient Hospital Stay (HOSPITAL_COMMUNITY): Payer: Medicare Other | Attending: Hematology

## 2021-04-30 ENCOUNTER — Encounter (HOSPITAL_COMMUNITY): Payer: Self-pay

## 2021-04-30 ENCOUNTER — Other Ambulatory Visit: Payer: Self-pay

## 2021-04-30 DIAGNOSIS — C561 Malignant neoplasm of right ovary: Secondary | ICD-10-CM | POA: Diagnosis present

## 2021-04-30 LAB — COMPREHENSIVE METABOLIC PANEL
ALT: 22 U/L (ref 0–44)
AST: 26 U/L (ref 15–41)
Albumin: 4.4 g/dL (ref 3.5–5.0)
Alkaline Phosphatase: 62 U/L (ref 38–126)
Anion gap: 10 (ref 5–15)
BUN: 17 mg/dL (ref 8–23)
CO2: 23 mmol/L (ref 22–32)
Calcium: 9.1 mg/dL (ref 8.9–10.3)
Chloride: 100 mmol/L (ref 98–111)
Creatinine, Ser: 0.7 mg/dL (ref 0.44–1.00)
GFR, Estimated: >60 mL/min (ref >60)
Glucose, Bld: 94 mg/dL (ref 70–99)
Potassium: 3.6 mmol/L (ref 3.5–5.1)
Sodium: 133 mmol/L — ABNORMAL LOW (ref 135–145)
Total Bilirubin: 0.4 mg/dL (ref 0.3–1.2)
Total Protein: 7.6 g/dL (ref 6.5–8.1)

## 2021-04-30 LAB — CBC WITH DIFFERENTIAL/PLATELET
Abs Immature Granulocytes: 0.02 10*3/uL (ref 0.00–0.07)
Basophils Absolute: 0 10*3/uL (ref 0.0–0.1)
Basophils Relative: 0 %
Eosinophils Absolute: 0.1 10*3/uL (ref 0.0–0.5)
Eosinophils Relative: 2 %
HCT: 36.5 % (ref 36.0–46.0)
Hemoglobin: 12.9 g/dL (ref 12.0–15.0)
Immature Granulocytes: 0 %
Lymphocytes Relative: 28 %
Lymphs Abs: 2 10*3/uL (ref 0.7–4.0)
MCH: 35.1 pg — ABNORMAL HIGH (ref 26.0–34.0)
MCHC: 35.3 g/dL (ref 30.0–36.0)
MCV: 99.2 fL (ref 80.0–100.0)
Monocytes Absolute: 0.8 10*3/uL (ref 0.1–1.0)
Monocytes Relative: 11 %
Neutro Abs: 4.2 10*3/uL (ref 1.7–7.7)
Neutrophils Relative %: 59 %
Platelets: 315 10*3/uL (ref 150–400)
RBC: 3.68 MIL/uL — ABNORMAL LOW (ref 3.87–5.11)
RDW: 12.5 % (ref 11.5–15.5)
WBC: 7.2 10*3/uL (ref 4.0–10.5)
nRBC: 0 % (ref 0.0–0.2)

## 2021-04-30 LAB — MAGNESIUM: Magnesium: 2.1 mg/dL (ref 1.7–2.4)

## 2021-04-30 MED ORDER — HEPARIN SOD (PORK) LOCK FLUSH 100 UNIT/ML IV SOLN
500.0000 [IU] | Freq: Once | INTRAVENOUS | Status: AC
  Administered 2021-04-30: 500 [IU] via INTRAVENOUS

## 2021-04-30 MED ORDER — SODIUM CHLORIDE 0.9% FLUSH
10.0000 mL | INTRAVENOUS | Status: DC | PRN
  Administered 2021-04-30: 10 mL via INTRAVENOUS

## 2021-04-30 NOTE — Patient Instructions (Signed)
[facility]  Discharge Instructions: Thank you for choosing [facility] to provide your oncology and hematology care.  If you have a lab appointment with the [facility], please come in thru the Main Entrance and check in at the main information desk.  Wear comfortable clothing and clothing appropriate for easy access to any Portacath or PICC line.   We strive to give you quality time with your provider. You may need to reschedule your appointment if you arrive late (15 or more minutes).  Arriving late affects you and other patients whose appointments are after yours.  Also, if you miss three or more appointments without notifying the office, you may be dismissed from the clinic at the provider's discretion.      For prescription refill requests, have your pharmacy contact our office and allow 72 hours for refills to be completed.    Today you received the following chemotherapy and/or immunotherapy agents port flush with lab draw      To help prevent nausea and vomiting after your treatment, we encourage you to take your nausea medication as directed.  BELOW ARE SYMPTOMS THAT SHOULD BE REPORTED IMMEDIATELY: *FEVER GREATER THAN 100.4 F (38 C) OR HIGHER *CHILLS OR SWEATING *NAUSEA AND VOMITING THAT IS NOT CONTROLLED WITH YOUR NAUSEA MEDICATION *UNUSUAL SHORTNESS OF BREATH *UNUSUAL BRUISING OR BLEEDING *URINARY PROBLEMS (pain or burning when urinating, or frequent urination) *BOWEL PROBLEMS (unusual diarrhea, constipation, pain near the anus) TENDERNESS IN MOUTH AND THROAT WITH OR WITHOUT PRESENCE OF ULCERS (sore throat, sores in mouth, or a toothache) UNUSUAL RASH, SWELLING OR PAIN  UNUSUAL VAGINAL DISCHARGE OR ITCHING   Items with * indicate a potential emergency and should be followed up as soon as possible or go to the Emergency Department if any problems should occur.  Please show the CHEMOTHERAPY ALERT CARD or IMMUNOTHERAPY ALERT CARD at check-in to the  Emergency Department and triage nurse.  Should you have questions after your visit or need to cancel or reschedule your appointment, please contact [facility] [phone]  and follow the prompts.  Office hours are 8:00 a.m. to 4:30 p.m. Monday - Friday. Please note that voicemails left after 4:00 p.m. may not be returned until the following business day.  We are closed weekends and major holidays. You have access to a nurse at all times for urgent questions. Please call the main number to the clinic [phone] and follow the prompts.  For any non-urgent questions, you may also contact your provider using MyChart. We now offer e-Visits for anyone 10 and older to request care online for non-urgent symptoms. For details visit [email or web address].   Also download the MyChart app! Go to the app store, search "MyChart", open the app, select [facility], and log in with your MyChart username and password.  Due to Covid, a mask is required upon entering the hospital/clinic. If you do not have a mask, one will be given to you upon arrival. For doctor visits, patients may have 1 support person aged 45 or older with them. For treatment visits, patients cannot have anyone with them due to current Covid guidelines and our immunocompromised population.

## 2021-04-30 NOTE — Progress Notes (Signed)
Alba Destine presented for Portacath access and flush and blood draw  Portacath located right chest wall accessed with  H 20 needle.  Good blood return present. Portacath flushed with 25ml NS and 500U/16ml Heparin and needle removed intact.  Procedure tolerated well and without incident.   Stable during and after port flush.  Vital signs stable.  AVS reviewed.  Discharged in stable condition ambulatory.

## 2021-05-01 LAB — CA 125: Cancer Antigen (CA) 125: 30.1 U/mL (ref 0.0–38.1)

## 2021-05-03 ENCOUNTER — Ambulatory Visit (HOSPITAL_COMMUNITY): Payer: Medicare Other

## 2021-05-05 NOTE — Progress Notes (Signed)
April Guerra, [place] [id number]   CLINIC:  Medical Oncology/Hematology  PCP:  Manon Hilding, MD [address] [place] [address] [phone]   REASON FOR VISIT:  Follow-up for right ovarian cancer  PRIOR THERAPY:  1. TAH-BSO on 05/03/2020. 2. Carboplatin, paclitaxel & Aloxi x 6 cycles from 06/06/2020 to 09/19/2020.  NGS Results: not done  CURRENT THERAPY: surveillance  BRIEF ONCOLOGIC HISTORY:  Oncology History  Right ovarian epithelial cancer ([place])  05/03/2020 Initial Diagnosis   Right ovarian epithelial cancer ([facility])   05/03/2020 Cancer Staging   Staging form: Ovary, Fallopian Tube, and Primary Peritoneal Carcinoma, AJCC 8th Edition - Clinical stage from 05/03/2020: FIGO Stage II, calculated as Stage Unknown (cT2b, cNX, cM0) - Signed by Everitt Amber, MD on 05/24/2020    06/06/2020 -  Chemotherapy    Patient is on Treatment Plan: OVARIAN CARBOPLATIN (AUC 6) / PACLITAXEL (175) Q21D X 6 CYCLES        Genetic Testing   Negative genetic testing. No pathogenic variants identified on the [employer]. The report date is 08/10/2020.  The CancerNext gene panel offered by [employer] includes sequencing and rearrangement analysis for the following 36 genes: APC*, ATM*, AXIN2, BARD1, BMPR1A, BRCA1*, BRCA2*, BRIP1*, CDH1*, CDK4, CDKN2A, CHEK2*, DICER1, MLH1*, MSH2*, MSH3, MSH6*, MUTYH*, NBN, NF1*, NTHL1, PALB2*, PMS2*, PTEN*, RAD51C*, RAD51D*, RECQL, SMAD4, SMARCA4, STK11 and TP53* (sequencing and deletion/duplication); HOXB13, POLD1 and POLE (sequencing only); EPCAM and GREM1 (deletion/duplication only).   Somatic genes analyzed through TumorNext-HRD: ATM, BARD1, BRCA1, BRCA2, BRIP1, CHEK2, MRE11A, NBN, PALB2, RAD51C, RAD51D.     CANCER STAGING: Cancer Staging Right ovarian epithelial cancer [facility]) Staging form: Ovary, Fallopian Tube, and Primary Peritoneal Carcinoma, AJCC 8th Edition - Clinical stage from  05/03/2020: FIGO Stage II, calculated as Stage Unknown (cT2b, cNX, cM0) - Signed by Everitt Amber, MD on 05/24/2020   INTERVAL HISTORY:  [name], a 75 y.o. female, returns for routine follow-up of her right ovarian cancer. [name] was last seen on 10/24/2020.   Today she reports feeling good. She reports worsened neuropathy in her hands for which she is taking 4 Gabapentin tablets daily. The pain and numbness in her hands has extended up to her shoulder in her right arm. She reports this pain started following working in her garden. She reports severe sharp pain in her right lower back at night which disrupts her sleep; this pain does not radiate anywhere, and it started 3 months ago. She also reports occasional numbness in the soles of her feet. She denies abdominal pain, nausea, and vomiting. She also reports occasional swelling of her ankles at the end of the day.   REVIEW OF SYSTEMS:  Review of Systems  Constitutional:  Negative for appetite change and fatigue.  Cardiovascular:  Negative for leg swelling.  Gastrointestinal:  Negative for abdominal pain, nausea and vomiting.  Musculoskeletal:  Positive for arthralgias (9/10 hip) and back pain (9/10 R lower).  Neurological:  Positive for numbness.  All other systems reviewed and are negative.  PAST MEDICAL/SURGICAL HISTORY:  Past Medical History:  Diagnosis Date   A-fib ([place])    Anxiety    Arthritis    Asthma    hx of    Cancer ([facility])    ovarian   Dyspnea    with exertion    H/O seasonal allergies    Heart murmur    as aa child    Hypercholesteremia  Hypertension    Spinal stenosis    Past Surgical History:  Procedure Laterality Date   ABDOMINAL HYSTERECTOMY     BIOPSY  05/08/2020   Procedure: BIOPSY;  Surgeon: Harvel Quale, MD;  [facility];  Service: Gastroenterology;;   BREAST SURGERY     CARDIAC CATHETERIZATION     CHOLECYSTECTOMY     COLONOSCOPY N/A 08/14/2015   Procedure: COLONOSCOPY;   Surgeon: Aviva Signs, MD;  [facility];  Service: Gastroenterology;  Laterality: N/A;   ESOPHAGOGASTRODUODENOSCOPY (EGD) WITH PROPOFOL N/A 05/08/2020   Procedure: ESOPHAGOGASTRODUODENOSCOPY (EGD) WITH PROPOFOL;  Surgeon: Harvel Quale, MD;  [facility];  Service: Gastroenterology;  Laterality: N/A;   ESOPHAGOGASTRODUODENOSCOPY (EGD) WITH PROPOFOL N/A 08/07/2020   Procedure: ESOPHAGOGASTRODUODENOSCOPY (EGD) WITH PROPOFOL;  Surgeon: Harvel Quale, MD;  [facility];  Service: Gastroenterology;  Laterality: N/A;  7:30   IR IMAGING GUIDED PORT INSERTION  06/04/2020   ROBOTIC ASSISTED TOTAL HYSTERECTOMY WITH BILATERAL SALPINGO OOPHERECTOMY N/A 05/03/2020   Procedure: XI ROBOTIC ASSISTED TOTAL HYSTERECTOMY WITH BILATERAL SALPINGO OOPHORECTOMY, OMENTECTOMY,RADICAL TUMOR DEBULKING, RIGID PROSTOSCOPY;  Surgeon: Everitt Amber, MD;  Location: [facility];  Service: Gynecology;  Laterality: N/A;   THROMBECTOMY FEMORAL ARTERY Right 12/17/2013   Procedure: REPAIR OF RIGHT FEMORAL ARTERY PSEUDOANEURYSM;  Surgeon: Elam Dutch, MD;  Location: [facility] OR;  Service: Vascular;  Laterality: Right;    SOCIAL HISTORY:  Social History   Socioeconomic History   Marital status: Married    Spouse name: Not on file   Number of children: 4   Years of education: Not on file   Highest education level: Not on file  Occupational History   Occupation: retired Marine scientist  Tobacco Use   Smoking status: Never   Smokeless tobacco: Never  Vaping Use   Vaping Use: Never used  Substance and Sexual Activity   Alcohol use: No   Drug use: No   Sexual activity: Not Currently  Other Topics Concern   Not on file  Social History Narrative   Not on file   Social Determinants of Health   Financial Resource Strain: Low Risk    Difficulty of Paying Living Expenses: Not hard at all  Food Insecurity: No Food Insecurity   Worried About Charity fundraiser in the Last Year: Never true    [place] in the Last Year: Never true  Transportation Needs: No Transportation Needs   Lack of Transportation (Medical): No   Lack of Transportation (Non-Medical): No  Physical Activity: Insufficiently Active   Days of Exercise per Week: 7 days   Minutes of Exercise per Session: 10 min  Stress: No Stress Concern Present   Feeling of Stress : Not at all  Social Connections: Moderately Integrated   Frequency of Communication with Friends and Family: More than three times a week   Frequency of Social Gatherings with Friends and Family: More than three times a week   Attends Religious Services: More than 4 times per year   Active Member of [employer] or Organizations: No   Attends Archivist Meetings: Never   Marital Status: Married  Human resources officer Violence: Not At Risk   Fear of Current or Ex-Partner: No   Emotionally Abused: No   Physically Abused: No   Sexually Abused: No    FAMILY HISTORY:  Family History  Problem Relation Age of Onset   Hypertension Mother    Heart failure Mother    Asthma Mother  Ulcers Mother    Benign prostatic hyperplasia Father    Ulcers Father    Esophageal varices Father    Atrial fibrillation Sister    Atrial fibrillation Brother    Heart disease Brother    Melanoma Maternal Aunt    Colon cancer Neg Hx     CURRENT MEDICATIONS:  Current Outpatient Medications  Medication Sig Dispense Refill   acetaminophen (TYLENOL) 500 MG tablet Take 1,000 mg by mouth 3 (three) times daily.     albuterol (VENTOLIN HFA) 108 (90 Base) MCG/ACT inhaler Inhale 2 puffs into the lungs every 4 (four) hours as needed for wheezing or shortness of breath. 18 g 2   ALPHA LIPOIC ACID PO Take by mouth.     amLODipine (NORVASC) 10 MG tablet Take 1 tablet (10 mg total) by mouth daily with lunch. For BP 30 tablet 3   aspirin EC 81 MG tablet Take 81 mg by mouth daily. Swallow whole.     b complex vitamins capsule Take 1 capsule by mouth at bedtime.      Calcium Carb-Cholecalciferol (CALCIUM 600/VITAMIN D PO) Take 600 mg by mouth in the morning and at bedtime.     cetirizine (ZYRTEC) 10 MG tablet Take 10 mg by mouth daily.     Cholecalciferol (VITAMIN D) 125 MCG (5000 UT) CAPS Take 5,000 Units by mouth daily.     Coenzyme Q10 (COQ10) 100 MG CAPS Take 100 mg by mouth at bedtime.      cyclobenzaprine (FLEXERIL) 10 MG tablet Take 5 mg by mouth See admin instructions. Take 5 mg at night, may take a second 5 mg dose during the day as needed for muscle spasms     diltiazem (CARDIZEM) 30 MG tablet Take 1 tablet every 4 hours AS NEEDED for AFIB heart rate >100 (Patient taking differently: Take 30 mg by mouth every 4 (four) hours as needed (AFIB heart rate > 100).) 30 tablet 3   fluticasone (FLONASE) 50 MCG/ACT nasal spray Place 1 spray into both nostrils daily.      gabapentin (NEURONTIN) 300 MG capsule TAKE 1 CAPSULE BY MOUTH 3  TIMES DAILY (Patient taking differently: 4 (four) times daily.) 270 capsule 3   LAGEVRIO 200 MG CAPS capsule SMARTSIG:4 Capsule(s) By Mouth Every 12 Hours     lisinopril (ZESTRIL) 30 MG tablet Take 1 tablet (30 mg total) by mouth in the morning. Okay to restart around Monday, 05/14/2020 if blood pressure stable (Patient taking differently: Take 30 mg by mouth in the morning.) 30 tablet 3   Magnesium 100 MG TABS Take 50 mg by mouth at bedtime.     melatonin 5 MG TABS Take 2.5 mg by mouth at bedtime as needed (Sleep).     metoprolol succinate (TOPROL-XL) 100 MG 24 hr tablet Take 100 mg by mouth in the morning. Take with or immediately following a meal.     Multiple Vitamins-Minerals (CENTRUM SILVER ADULT 50+ PO) Take 1 tablet by mouth daily.     Omega-3 Fatty Acids (FISH OIL) 1000 MG CAPS Take 1,000 mg by mouth daily.     pantoprazole (PROTONIX) 40 MG tablet Take 1 tablet (40 mg total) by mouth daily. 90 tablet 3   polyethylene glycol powder (GLYCOLAX/MIRALAX) 17 GM/SCOOP powder Take 17 g by mouth daily as needed for mild constipation  or moderate constipation (With lunch).     senna (SENOKOT) 8.6 MG tablet Take 1 tablet by mouth daily as needed for constipation.     simvastatin (ZOCOR)  40 MG tablet Take 0.5 tablets (20 mg total) by mouth daily. (Patient taking differently: Take 40 mg by mouth at bedtime.) 30 tablet 2   sodium chloride (OCEAN) 0.65 % SOLN nasal spray Place 1 spray into both nostrils See admin instructions. Use 1 spray in each nostril in the morning may use a second dose at night as needed for congestion     tetrahydrozoline-zinc (VISINE-AC) 0.05-0.25 % ophthalmic solution Place 1 drop into both eyes daily.     triamcinolone (KENALOG) 0.1 % Apply 1 application topically daily as needed (irritation).     Turmeric (QC TUMERIC COMPLEX PO) Take by mouth.     vitamin C (ASCORBIC ACID) 500 MG tablet Take 1,000 mg by mouth 2 (two) times daily.     Vitamins A & D (VITAMIN A & D) ointment Apply 1 application topically daily as needed for dry skin (irritation).     lidocaine-prilocaine (EMLA) cream Apply to affected area once (Patient not taking: Reported on 05/06/2021) 30 g 3   ondansetron (ZOFRAN) 4 MG tablet Take 1 tablet (4 mg total) by mouth every 6 (six) hours as needed for nausea. (Patient not taking: Reported on 05/06/2021) 20 tablet 0   prochlorperazine (COMPAZINE) 10 MG tablet Take 1 tablet (10 mg total) by mouth every 6 (six) hours as needed (Nausea or vomiting). (Patient not taking: Reported on 05/06/2021) 30 tablet 1   No current facility-administered medications for this visit.    ALLERGIES:  Allergies  Allergen Reactions   Morphine And Related Nausea And Vomiting   Zofran [Ondansetron]     Causes minor constipation, tolerates low doses      PHYSICAL EXAM:  Performance status (ECOG): 1 - Symptomatic but completely ambulatory  Vitals:   05/06/21 1200  BP: (!) 168/94  Pulse: 80  Resp: 16  Temp: 98.2 F (36.8 C)  SpO2: 97%   Wt Readings from Last 3 Encounters:  05/06/21 174 lb 12.8 oz (79.3 kg)   02/15/21 169 lb 15.6 oz (77.1 kg)  01/28/21 172 lb 2.9 oz (78.1 kg)   Physical Exam Vitals reviewed.  Constitutional:      Appearance: Normal appearance. She is obese.  Cardiovascular:     Rate and Rhythm: Normal rate and regular rhythm.     Pulses: Normal pulses.     Heart sounds: Normal heart sounds.  Pulmonary:     Effort: Pulmonary effort is normal.     Breath sounds: Normal breath sounds.  Musculoskeletal:     Right lower leg: Edema (trace) present.     Left lower leg: Edema (trace) present.  Neurological:     General: No focal deficit present.     Mental Status: She is alert and oriented to person, place, and time.  Psychiatric:        Mood and Affect: Mood normal.        Behavior: Behavior normal.     LABORATORY DATA:  I have reviewed the labs as listed.  CBC Latest Ref Rng & Units 04/30/2021 01/22/2021 10/16/2020  WBC 4.0 - 10.5 K/uL 7.2 9.4 7.3  Hemoglobin 12.0 - 15.0 g/dL 12.9 12.8 10.7(L)  Hematocrit 36.0 - 46.0 % 36.5 38.8 31.8(L)  Platelets 150 - 400 K/uL 315 297 273   CMP Latest Ref Rng & Units 04/30/2021 01/22/2021 10/16/2020  Glucose 70 - 99 mg/dL 94 92 91  BUN 8 - 23 mg/dL 17 19 10   Creatinine 0.44 - 1.00 mg/dL 0.70 0.71 0.44  Sodium 135 - 145 mmol/L  133(L) 135 132(L)  Potassium 3.5 - 5.1 mmol/L 3.6 4.0 3.5  Chloride 98 - 111 mmol/L 100 101 100  CO2 22 - 32 mmol/L 23 27 23   Calcium 8.9 - 10.3 mg/dL 9.1 9.2 9.0  Total Protein 6.5 - 8.1 g/dL 7.6 6.9 6.7  Total Bilirubin 0.3 - 1.2 mg/dL 0.4 0.9 0.5  Alkaline Phos 38 - 126 U/L 62 45 57  AST 15 - 41 U/L 26 19 24   ALT 0 - 44 U/L 22 22 19     DIAGNOSTIC IMAGING:  I have independently reviewed the scans and discussed with the patient. No results found.   ASSESSMENT:  1.  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 -[id number] on 04/20/2020 -Evaluated by Dr. Denman George on 05/24/2020. -6 cycles of carboplatin and paclitaxel  was recommended. -Somatic and germline mutation testing was negative. -6 cycles of carboplatin and paclitaxel from 06/06/2020 through 09/19/2020.   2.  Atrial fibrillation: -Was on Eliquis. -She had postoperative GI bleed when Eliquis was discontinued.   PLAN:  1.  Stage II clear-cell right ovarian cancer: - CT [facility] on 10/16/2020 did not show any evidence of recurrence.  Increased density at the vaginal apex thought to be treatment related. - Reviewed labs from 04/30/2021-normal chemistries and LFTs.  Normal CBC. - CA125 is normal at 30.1. - Recommend follow-up in 3 months with CT scan of the abdomen and pelvis with contrast along with tumor marker.   2.  Peripheral neuropathy: - She has neuropathy in the fingertips and feet from chemotherapy. - Continue gabapentin 300 mg 4 times daily.   3.  Genetic testing: - Somatic and germline mutation testing was negative.  4.  Low back pain: - She reports right-sided lower back pain for the past 2 to 3 months. - This pain has not improved and has been occasionally waking her up at night.  No radiation to the leg.  Pain is described as sharp. - Recommend MRI of the lumbar spine with and without contrast. - She will follow-up with Tarri Abernethy, PA after MRI.   Orders placed this encounter:  No orders of the defined types were placed in this encounter.    Derek Jack, MD [place] [phone]   I, Thana Ates, am acting as a scribe for Dr. Derek Jack.  I, Derek Jack MD, have reviewed the above documentation for accuracy and completeness, and I agree with the above.

## 2021-05-06 ENCOUNTER — Inpatient Hospital Stay (HOSPITAL_BASED_OUTPATIENT_CLINIC_OR_DEPARTMENT_OTHER): Payer: Medicare Other | Admitting: Hematology

## 2021-05-06 ENCOUNTER — Other Ambulatory Visit: Payer: Self-pay

## 2021-05-06 VITALS — BP 168/94 | HR 80 | Temp 98.2°F | Resp 16 | Wt 174.8 lb

## 2021-05-06 DIAGNOSIS — C561 Malignant neoplasm of right ovary: Secondary | ICD-10-CM | POA: Diagnosis not present

## 2021-05-06 NOTE — Patient Instructions (Signed)
[place] at [facility] Discharge Instructions  You were seen and examined today by Dr. Delton Coombes. Your recent lab work looks good, including a completely normal tumor marker! Great news!  Dr. Delton Coombes will see you back in about 3 months following a repeat CT scan.   Thank you for choosing [place] at [facility] to provide your oncology and hematology care.  To afford each patient quality time with our provider, please arrive at least 15 minutes before your scheduled appointment time.   If you have a lab appointment with the [facility] please come in thru the Main Entrance and check in at the main information desk.  You need to re-schedule your appointment should you arrive 10 or more minutes late.  We strive to give you quality time with our providers, and arriving late affects you and other patients whose appointments are after yours.  Also, if you no show three or more times for appointments you may be dismissed from the clinic at the providers discretion.     Again, thank you for choosing [facility].  Our hope is that these requests will decrease the amount of time that you wait before being seen by our physicians.       _____________________________________________________________  Should you have questions after your visit to [facility], please contact our office at [phone] and follow the prompts.  Our office hours are 8:00 a.m. and 4:30 p.m. Monday - Friday.  Please note that voicemails left after 4:00 p.m. may not be returned until the following business day.  We are closed weekends and major holidays.  You do have access to a nurse 24-7, just call the main number to the clinic [phone] and do not press any options, hold on the line and a nurse will answer the phone.    For prescription refill requests, have your pharmacy contact our office and allow 72 hours.    Due to Covid, you will need to  wear a mask upon entering the hospital. If you do not have a mask, a mask will be given to you at the Main Entrance upon arrival. For doctor visits, patients may have 1 support person age 76 or older with them. For treatment visits, patients can not have anyone with them due to social distancing guidelines and our immunocompromised population.

## 2021-05-07 ENCOUNTER — Ambulatory Visit (HOSPITAL_COMMUNITY)
Admission: RE | Admit: 2021-05-07 | Discharge: 2021-05-07 | Disposition: A | Discharge status: Home / Self Care | Payer: Medicare Other | Source: Ambulatory Visit | Attending: Hematology | Admitting: Hematology

## 2021-05-07 DIAGNOSIS — C561 Malignant neoplasm of right ovary: Secondary | ICD-10-CM | POA: Insufficient documentation

## 2021-05-07 MED ORDER — GADOBUTROL 1 MMOL/ML IV SOLN
10.0000 mL | Freq: Once | INTRAVENOUS | Status: AC | PRN
  Administered 2021-05-07: 10 mL via INTRAVENOUS

## 2021-05-08 NOTE — Progress Notes (Signed)
[place], [place] [address]   CLINIC:  Medical Oncology/Hematology  PCP:  Manon Hilding, MD [place] [address] [phone]   HISTORY OF PRESENT ILLNESS:  Ms. Name 75 y.o. female who is managed by Dr. Delton Coombes for stage II clear-cell right ovarian cancer s/p total hysterectomy/BSO/omentectomy and 6 cycles of carboplatin and paclitaxel.  She was seen by Dr. Delton Coombes earlier this week on 05/06/2021, and during that visit she complained of low back pain.  MRI lumbar spine was ordered by Dr. Delton Coombes, and patient returns today to discuss these results and next steps.  She reports that she has been having low back pain and right hip pain for the past month.  Pain is rated as 4 out of 10 in intensity, described as sharp, has been waking her up at night for the past few weeks.  She has some associated numbness of her right buttocks.  She denies any radiation to her legs, or weakness/sensory changes in her legs.  No bowel or bladder dysfunction.  She does have some neuropathy of her hands and feet secondary to chemotherapy, for which she is taking gabapentin with adequate relief.  She takes Tylenol and Flexeril for her back pain, with adequate relief.  She reports that she saw a spine specialist many years ago (2015) for her low back pain, who suggested surgery.  She reports that she did not want surgery at that time and would be hesitant to proceed with any surgery now.  She reports that she has also seen an orthopedic specialist in the past, and that injections in her right hip helped with the pain.    REVIEW OF SYSTEMS:  Review of Systems  Constitutional:  Negative for appetite change, chills, diaphoresis, fatigue, fever and unexpected weight change.  HENT:   Negative for lump/mass and nosebleeds.   Eyes:  Negative for eye problems.  Respiratory:  Negative for cough, hemoptysis and shortness of breath.   Cardiovascular:  Negative for  chest pain, leg swelling and palpitations.  Gastrointestinal:  Negative for abdominal pain, blood in stool, constipation, diarrhea, nausea and vomiting.  Genitourinary:  Negative for hematuria.   Musculoskeletal:  Positive for arthralgias and back pain.  Skin: Negative.   Neurological:  Positive for numbness. Negative for dizziness, headaches and light-headedness.  Hematological:  Does not bruise/bleed easily.     PAST MEDICAL/SURGICAL HISTORY:  Past Medical History:  Diagnosis Date   A-fib ([place])    Anxiety    Arthritis    Asthma    hx of    Cancer ([place])    ovarian   Dyspnea    with exertion    H/O seasonal allergies    Heart murmur    as aa child    Hypercholesteremia    Hypertension    Spinal stenosis    Past Surgical History:  Procedure Laterality Date   ABDOMINAL HYSTERECTOMY     BIOPSY  05/08/2020   Procedure: BIOPSY;  Surgeon: Harvel Quale, MD;  Location: [facility];  Service: Gastroenterology;;   BREAST SURGERY     CARDIAC CATHETERIZATION     CHOLECYSTECTOMY     COLONOSCOPY N/A 08/14/2015   Procedure: COLONOSCOPY;  Surgeon: Aviva Signs, MD;  Location: [facility];  Service: Gastroenterology;  Laterality: N/A;   ESOPHAGOGASTRODUODENOSCOPY (EGD) WITH PROPOFOL N/A 05/08/2020   Procedure: ESOPHAGOGASTRODUODENOSCOPY (EGD) WITH PROPOFOL;  Surgeon: Harvel Quale, MD;  [facility]: [facility];  Service:  Gastroenterology;  Laterality: N/A;   ESOPHAGOGASTRODUODENOSCOPY (EGD) WITH PROPOFOL N/A 08/07/2020   Procedure: ESOPHAGOGASTRODUODENOSCOPY (EGD) WITH PROPOFOL;  Surgeon: Harvel Quale, MD;  [facility]: [facility];  Service: Gastroenterology;  Laterality: N/A;  7:30   IR IMAGING GUIDED PORT INSERTION  06/04/2020   ROBOTIC ASSISTED TOTAL HYSTERECTOMY WITH BILATERAL SALPINGO OOPHERECTOMY N/A 05/03/2020   Procedure: XI ROBOTIC ASSISTED TOTAL HYSTERECTOMY WITH BILATERAL SALPINGO OOPHORECTOMY, OMENTECTOMY,RADICAL TUMOR DEBULKING, RIGID  PROSTOSCOPY;  Surgeon: Everitt Amber, MD;  Location: [facility];  Service: Gynecology;  Laterality: N/A;   THROMBECTOMY FEMORAL ARTERY Right 12/17/2013   Procedure: REPAIR OF RIGHT FEMORAL ARTERY PSEUDOANEURYSM;  Surgeon: Elam Dutch, MD;  Location: [facility];  Service: Vascular;  Laterality: Right;     SOCIAL HISTORY:  Social History   Socioeconomic History   Marital status: Married    Spouse name: Not on file   Number of children: 4   Years of education: Not on file   Highest education level: Not on file  Occupational History   Occupation: retired Marine scientist  Tobacco Use   Smoking status: Never   Smokeless tobacco: Never  Vaping Use   Vaping Use: Never used  Substance and Sexual Activity   Alcohol use: No   Drug use: No   Sexual activity: Not Currently  Other Topics Concern   Not on file  Social History Narrative   Not on file   Social Determinants of Health   Financial Resource Strain: Low Risk    Difficulty of Paying Living Expenses: Not hard at all  Food Insecurity: No Food Insecurity   Worried About Charity fundraiser in the Last Year: Never true   [place] in the Last Year: Never true  Transportation Needs: No Transportation Needs   Lack of Transportation (Medical): No   Lack of Transportation (Non-Medical): No  Physical Activity: Insufficiently Active   Days of Exercise per Week: 7 days   Minutes of Exercise per Session: 10 min  Stress: No Stress Concern Present   Feeling of Stress : Not at all  Social Connections: Moderately Integrated   Frequency of Communication with Friends and Family: More than three times a week   Frequency of Social Gatherings with Friends and Family: More than three times a week   Attends Religious Services: More than 4 times per year   Active Member of [employer] or Organizations: No   Attends Music therapist: Never   Marital Status: Married  Human resources officer Violence: Not At Risk   Fear of Current or Ex-Partner: No    Emotionally Abused: No   Physically Abused: No   Sexually Abused: No    FAMILY HISTORY:  Family History  Problem Relation Age of Onset   Hypertension Mother    Heart failure Mother    Asthma Mother    Ulcers Mother    Benign prostatic hyperplasia Father    Ulcers Father    Esophageal varices Father    Atrial fibrillation Sister    Atrial fibrillation Brother    Heart disease Brother    Melanoma Maternal Aunt    Colon cancer Neg Hx     CURRENT MEDICATIONS:  Outpatient Encounter Medications as of 05/09/2021  Medication Sig   acetaminophen (TYLENOL) 500 MG tablet Take 1,000 mg by mouth 3 (three) times daily.   albuterol (VENTOLIN HFA) 108 (90 Base) MCG/ACT inhaler Inhale 2 puffs into the lungs every 4 (four) hours as needed for wheezing or shortness of breath.  ALPHA LIPOIC ACID PO Take by mouth.   amLODipine (NORVASC) 10 MG tablet Take 1 tablet (10 mg total) by mouth daily with lunch. For BP   aspirin EC 81 MG tablet Take 81 mg by mouth daily. Swallow whole.   b complex vitamins capsule Take 1 capsule by mouth at bedtime.   Calcium Carb-Cholecalciferol (CALCIUM 600/VITAMIN D PO) Take 600 mg by mouth in the morning and at bedtime.   cetirizine (ZYRTEC) 10 MG tablet Take 10 mg by mouth daily.   Cholecalciferol (VITAMIN D) 125 MCG (5000 UT) CAPS Take 5,000 Units by mouth daily.   Coenzyme Q10 (COQ10) 100 MG CAPS Take 100 mg by mouth at bedtime.    cyclobenzaprine (FLEXERIL) 10 MG tablet Take 5 mg by mouth See admin instructions. Take 5 mg at night, may take a second 5 mg dose during the day as needed for muscle spasms   diltiazem (CARDIZEM) 30 MG tablet Take 1 tablet every 4 hours AS NEEDED for AFIB heart rate >100 (Patient taking differently: Take 30 mg by mouth every 4 (four) hours as needed (AFIB heart rate > 100).)   fluticasone (FLONASE) 50 MCG/ACT nasal spray Place 1 spray into both nostrils daily.    gabapentin (NEURONTIN) 300 MG capsule TAKE 1 CAPSULE BY MOUTH 3  TIMES DAILY  (Patient taking differently: 4 (four) times daily.)   LAGEVRIO 200 MG CAPS capsule SMARTSIG:4 Capsule(s) By Mouth Every 12 Hours   lidocaine-prilocaine (EMLA) cream Apply to affected area once (Patient not taking: Reported on 05/06/2021)   lisinopril (ZESTRIL) 30 MG tablet Take 1 tablet (30 mg total) by mouth in the morning. Okay to restart around Monday, 05/14/2020 if blood pressure stable (Patient taking differently: Take 30 mg by mouth in the morning.)   Magnesium 100 MG TABS Take 50 mg by mouth at bedtime.   melatonin 5 MG TABS Take 2.5 mg by mouth at bedtime as needed (Sleep).   metoprolol succinate (TOPROL-XL) 100 MG 24 hr tablet Take 100 mg by mouth in the morning. Take with or immediately following a meal.   Multiple Vitamins-Minerals (CENTRUM SILVER ADULT 50+ PO) Take 1 tablet by mouth daily.   Omega-3 Fatty Acids (FISH OIL) 1000 MG CAPS Take 1,000 mg by mouth daily.   ondansetron (ZOFRAN) 4 MG tablet Take 1 tablet (4 mg total) by mouth every 6 (six) hours as needed for nausea. (Patient not taking: Reported on 05/06/2021)   pantoprazole (PROTONIX) 40 MG tablet Take 1 tablet (40 mg total) by mouth daily.   polyethylene glycol powder (GLYCOLAX/MIRALAX) 17 GM/SCOOP powder Take 17 g by mouth daily as needed for mild constipation or moderate constipation (With lunch).   prochlorperazine (COMPAZINE) 10 MG tablet Take 1 tablet (10 mg total) by mouth every 6 (six) hours as needed (Nausea or vomiting). (Patient not taking: Reported on 05/06/2021)   senna (SENOKOT) 8.6 MG tablet Take 1 tablet by mouth daily as needed for constipation.   simvastatin (ZOCOR) 40 MG tablet Take 0.5 tablets (20 mg total) by mouth daily. (Patient taking differently: Take 40 mg by mouth at bedtime.)   sodium chloride (OCEAN) 0.65 % SOLN nasal spray Place 1 spray into both nostrils See admin instructions. Use 1 spray in each nostril in the morning may use a second dose at night as needed for congestion   tetrahydrozoline-zinc  (VISINE-AC) 0.05-0.25 % ophthalmic solution Place 1 drop into both eyes daily.   triamcinolone (KENALOG) 0.1 % Apply 1 application topically daily as needed (irritation).  Turmeric (QC TUMERIC COMPLEX PO) Take by mouth.   vitamin C (ASCORBIC ACID) 500 MG tablet Take 1,000 mg by mouth 2 (two) times daily.   Vitamins A & D (VITAMIN A & D) ointment Apply 1 application topically daily as needed for dry skin (irritation).   No facility-administered encounter medications on file as of 05/09/2021.    ALLERGIES:  Allergies  Allergen Reactions   Morphine And Related Nausea And Vomiting   Zofran [Ondansetron]     Causes minor constipation, tolerates low doses       PHYSICAL EXAM:  ECOG PERFORMANCE STATUS: 1 - Symptomatic but completely ambulatory  There were no vitals filed for this visit. There were no vitals filed for this visit. Physical Exam Constitutional:      Appearance: Normal appearance. She is obese.  HENT:     Head: Normocephalic and atraumatic.     Mouth/Throat:     Mouth: Mucous membranes are moist.  Eyes:     Extraocular Movements: Extraocular movements intact.     Pupils: Pupils are equal, round, and reactive to light.  Cardiovascular:     Rate and Rhythm: Normal rate and regular rhythm.     Pulses: Normal pulses.     Heart sounds: Normal heart sounds.  Pulmonary:     Effort: Pulmonary effort is normal.     Breath sounds: Normal breath sounds.  Abdominal:     General: Bowel sounds are normal.     Palpations: Abdomen is soft.     Tenderness: There is no abdominal tenderness.  Musculoskeletal:        General: No swelling.     Lumbar back: Spasms and tenderness present.       Back:     Right lower leg: No edema.     Left lower leg: No edema.  Lymphadenopathy:     Cervical: No cervical adenopathy.  Skin:    General: Skin is warm and dry.  Neurological:     General: No focal deficit present.     Mental Status: She is alert and oriented to person, place, and  time.  Psychiatric:        Mood and Affect: Mood normal.        Behavior: Behavior normal.     LABORATORY DATA:  I have reviewed the labs as listed.  CBC    Component Value Date/Time   WBC 7.2 04/30/2021 1403   RBC 3.68 (L) 04/30/2021 1403   HGB 12.9 04/30/2021 1403   HCT 36.5 04/30/2021 1403   PLT 315 04/30/2021 1403   MCV 99.2 04/30/2021 1403   MCH 35.1 (H) 04/30/2021 1403   MCHC 35.3 04/30/2021 1403   RDW 12.5 04/30/2021 1403   LYMPHSABS 2.0 04/30/2021 1403   MONOABS 0.8 04/30/2021 1403   EOSABS 0.1 04/30/2021 1403   BASOSABS 0.0 04/30/2021 1403   CMP Latest Ref Rng & Units 04/30/2021 01/22/2021 10/16/2020  Glucose 70 - 99 mg/dL 94 92 91  BUN 8 - 23 mg/dL 17 19 10   Creatinine 0.44 - 1.00 mg/dL 0.70 0.71 0.44  Sodium 135 - 145 mmol/L 133(L) 135 132(L)  Potassium 3.5 - 5.1 mmol/L 3.6 4.0 3.5  Chloride 98 - 111 mmol/L 100 101 100  CO2 22 - 32 mmol/L 23 27 23   Calcium 8.9 - 10.3 mg/dL 9.1 9.2 9.0  Total Protein 6.5 - 8.1 g/dL 7.6 6.9 6.7  Total Bilirubin 0.3 - 1.2 mg/dL 0.4 0.9 0.5  Alkaline Phos 38 - 126 U/L 62 45  57  AST 15 - 41 U/L 26 19 24   ALT 0 - 44 U/L 22 22 19     DIAGNOSTIC IMAGING:  I have independently reviewed the relevant imaging and discussed with the patient.  ASSESSMENT & PLAN: 1.  Low back pain and right hip pain - Right-sided hip and lower back pain for the past 1 to 2 months - Pain has not improved and has been occasionally waking her up at night.  No radiation to the leg.  Pain is described as sharp, and rated in intensity as 4 out of 10. -Pain is relieved with Tylenol and Flexeril. - She has previously seen spinal specialist in about 2015, who recommended surgery for degenerative changes.  She has also seen orthopedic specialist and received right hip injection with good relief. - MRI lumbar spine with and without contrast (05/07/2021): Severe multifactorial spinal and bilateral lateral recess stenosis at L4-5.  There is also mild bilateral foraminal  stenosis.  Mild bilateral recess stenosis at L1-2, L2-3, and L3-4.  Mild bilateral foraminal narrowing at L5-S1.  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 - PLAN: Discussed results of MRI with patient. - Recommend continuing Tylenol and Flexeril for management of back pain. - Recommend follow-up with her orthopedic specialist and spine doctor for ongoing management of her degenerative disc disease and joint pain. - Patient educated on red flag symptoms associated with back pain that would prompt immediate medical attention at the emergency department.  2.  Stage II clear cell right ovarian cancer  - Has completed treatment but continues to follow with Dr. Delton Coombes for posttreatment surveillance - Further details per most recent oncology progress note dated 05/06/2021 - PLAN: Continue follow-up as per plan established by Dr. Delton Coombes with repeat CT scan and MD visit in 3 months.   All questions were answered. The patient knows to call the clinic with any problems, questions or concerns.  Medical decision making: Low  Time spent on visit: I spent 20 minutes counseling the patient face to face. The total time spent in the appointment was 30 minutes and more than 50% was on counseling.   Harriett Rush, PA-C  05/09/2021 9:08 AM

## 2021-05-09 ENCOUNTER — Inpatient Hospital Stay (HOSPITAL_BASED_OUTPATIENT_CLINIC_OR_DEPARTMENT_OTHER): Payer: Medicare Other | Admitting: Physician Assistant

## 2021-05-09 ENCOUNTER — Other Ambulatory Visit: Payer: Self-pay

## 2021-05-09 VITALS — BP 167/88 | HR 74 | Temp 96.9°F | Resp 18 | Wt 174.6 lb

## 2021-05-09 DIAGNOSIS — C561 Malignant neoplasm of right ovary: Secondary | ICD-10-CM | POA: Diagnosis not present

## 2021-05-09 DIAGNOSIS — M5441 Lumbago with sciatica, right side: Secondary | ICD-10-CM

## 2021-05-09 NOTE — Patient Instructions (Signed)
[place] at [facility] Discharge Instructions  You were seen today by Tarri Abernethy PA-C for your low back pain and to discuss MRI results.  MRI showed chronic degenerative changes which are most likely contributing to your low back pain.  We recommend that you see a spinal specialist, and per your request, you can follow-up with your orthopedic doctor to see if they can recommend you to a neurosurgeon or spine doctor within their same group.  Continue Tylenol as needed for back pain, up to 3000 mg daily.  You can also continue your previously prescribed Flexeril.  If you experience any severe back pain associated with weakness in your legs, bowel or bladder dysfunction, or sensory changes, please seek immediate medical attention.  Regarding your ovarian cancer, continue to follow-up with Dr. Delton Coombes as previously scheduled.     Thank you for choosing [place] at [facility] to provide your oncology and hematology care.  To afford each patient quality time with our provider, please arrive at least 15 minutes before your scheduled appointment time.   If you have a lab appointment with the [facility] please come in thru the Main Entrance and check in at the main information desk.  You need to re-schedule your appointment should you arrive 10 or more minutes late.  We strive to give you quality time with our providers, and arriving late affects you and other patients whose appointments are after yours.  Also, if you no show three or more times for appointments you may be dismissed from the clinic at the providers discretion.     Again, thank you for choosing [facility].  Our hope is that these requests will decrease the amount of time that you wait before being seen by our physicians.       _____________________________________________________________  Should you have questions after your visit to [facility],  please contact our office at [phone] and follow the prompts.  Our office hours are 8:00 a.m. and 4:30 p.m. Monday - Friday.  Please note that voicemails left after 4:00 p.m. may not be returned until the following business day.  We are closed weekends and major holidays.  You do have access to a nurse 24-7, just call the main number to the clinic [phone] and do not press any options, hold on the line and a nurse will answer the phone.    For prescription refill requests, have your pharmacy contact our office and allow 72 hours.    Due to Covid, you will need to wear a mask upon entering the hospital. If you do not have a mask, a mask will be given to you at the Main Entrance upon arrival. For doctor visits, patients may have 1 support person age 69 or older with them. For treatment visits, patients can not have anyone with them due to social distancing guidelines and our immunocompromised population.

## 2021-05-10 ENCOUNTER — Ambulatory Visit (HOSPITAL_COMMUNITY)
Admission: RE | Admit: 2021-05-10 | Discharge: 2021-05-10 | Disposition: A | Discharge status: Home / Self Care | Payer: Medicare Other | Source: Ambulatory Visit | Attending: Family Medicine | Admitting: Family Medicine

## 2021-05-10 DIAGNOSIS — Z1231 Encounter for screening mammogram for malignant neoplasm of breast: Secondary | ICD-10-CM | POA: Diagnosis present

## 2021-08-05 ENCOUNTER — Other Ambulatory Visit: Payer: Self-pay

## 2021-08-05 ENCOUNTER — Encounter (HOSPITAL_COMMUNITY): Payer: Self-pay

## 2021-08-05 ENCOUNTER — Ambulatory Visit (HOSPITAL_COMMUNITY)
Admission: RE | Admit: 2021-08-05 | Discharge: 2021-08-05 | Disposition: A | Discharge status: Home / Self Care | Payer: Medicare Other | Source: Ambulatory Visit | Attending: Hematology | Admitting: Hematology

## 2021-08-05 ENCOUNTER — Inpatient Hospital Stay (HOSPITAL_COMMUNITY): Payer: Medicare Other | Attending: Hematology

## 2021-08-05 DIAGNOSIS — I700 Atherosclerosis of aorta: Secondary | ICD-10-CM | POA: Insufficient documentation

## 2021-08-05 DIAGNOSIS — Z808 Family history of malignant neoplasm of other organs or systems: Secondary | ICD-10-CM | POA: Insufficient documentation

## 2021-08-05 DIAGNOSIS — G629 Polyneuropathy, unspecified: Secondary | ICD-10-CM | POA: Insufficient documentation

## 2021-08-05 DIAGNOSIS — M4316 Spondylolisthesis, lumbar region: Secondary | ICD-10-CM | POA: Insufficient documentation

## 2021-08-05 DIAGNOSIS — M47816 Spondylosis without myelopathy or radiculopathy, lumbar region: Secondary | ICD-10-CM | POA: Insufficient documentation

## 2021-08-05 DIAGNOSIS — I4891 Unspecified atrial fibrillation: Secondary | ICD-10-CM | POA: Insufficient documentation

## 2021-08-05 DIAGNOSIS — Z8379 Family history of other diseases of the digestive system: Secondary | ICD-10-CM | POA: Insufficient documentation

## 2021-08-05 DIAGNOSIS — C561 Malignant neoplasm of right ovary: Secondary | ICD-10-CM | POA: Diagnosis not present

## 2021-08-05 DIAGNOSIS — Z90710 Acquired absence of both cervix and uterus: Secondary | ICD-10-CM | POA: Insufficient documentation

## 2021-08-05 DIAGNOSIS — Z8249 Family history of ischemic heart disease and other diseases of the circulatory system: Secondary | ICD-10-CM | POA: Insufficient documentation

## 2021-08-05 DIAGNOSIS — K449 Diaphragmatic hernia without obstruction or gangrene: Secondary | ICD-10-CM | POA: Insufficient documentation

## 2021-08-05 DIAGNOSIS — M25529 Pain in unspecified elbow: Secondary | ICD-10-CM | POA: Insufficient documentation

## 2021-08-05 DIAGNOSIS — Z836 Family history of other diseases of the respiratory system: Secondary | ICD-10-CM | POA: Insufficient documentation

## 2021-08-05 DIAGNOSIS — M48061 Spinal stenosis, lumbar region without neurogenic claudication: Secondary | ICD-10-CM | POA: Insufficient documentation

## 2021-08-05 DIAGNOSIS — K91841 Postprocedural hemorrhage and hematoma of a digestive system organ or structure following other procedure: Secondary | ICD-10-CM | POA: Insufficient documentation

## 2021-08-05 DIAGNOSIS — K5730 Diverticulosis of large intestine without perforation or abscess without bleeding: Secondary | ICD-10-CM | POA: Insufficient documentation

## 2021-08-05 DIAGNOSIS — M5136 Other intervertebral disc degeneration, lumbar region: Secondary | ICD-10-CM | POA: Insufficient documentation

## 2021-08-05 DIAGNOSIS — Z79899 Other long term (current) drug therapy: Secondary | ICD-10-CM | POA: Insufficient documentation

## 2021-08-05 DIAGNOSIS — Z9049 Acquired absence of other specified parts of digestive tract: Secondary | ICD-10-CM | POA: Insufficient documentation

## 2021-08-05 DIAGNOSIS — M2550 Pain in unspecified joint: Secondary | ICD-10-CM | POA: Insufficient documentation

## 2021-08-05 DIAGNOSIS — Z888 Allergy status to other drugs, medicaments and biological substances status: Secondary | ICD-10-CM | POA: Insufficient documentation

## 2021-08-05 DIAGNOSIS — Z90722 Acquired absence of ovaries, bilateral: Secondary | ICD-10-CM | POA: Insufficient documentation

## 2021-08-05 DIAGNOSIS — Z885 Allergy status to narcotic agent status: Secondary | ICD-10-CM | POA: Insufficient documentation

## 2021-08-05 LAB — CBC WITH DIFFERENTIAL/PLATELET
Abs Immature Granulocytes: 0.02 10*3/uL (ref 0.00–0.07)
Basophils Absolute: 0 10*3/uL (ref 0.0–0.1)
Basophils Relative: 0 %
Eosinophils Absolute: 0.2 10*3/uL (ref 0.0–0.5)
Eosinophils Relative: 3 %
HCT: 37.4 % (ref 36.0–46.0)
Hemoglobin: 12.7 g/dL (ref 12.0–15.0)
Immature Granulocytes: 0 %
Lymphocytes Relative: 24 %
Lymphs Abs: 1.7 10*3/uL (ref 0.7–4.0)
MCH: 33.2 pg (ref 26.0–34.0)
MCHC: 34 g/dL (ref 30.0–36.0)
MCV: 97.9 fL (ref 80.0–100.0)
Monocytes Absolute: 0.8 10*3/uL (ref 0.1–1.0)
Monocytes Relative: 12 %
Neutro Abs: 4.2 10*3/uL (ref 1.7–7.7)
Neutrophils Relative %: 61 %
Platelets: 323 10*3/uL (ref 150–400)
RBC: 3.82 MIL/uL — ABNORMAL LOW (ref 3.87–5.11)
RDW: 12.9 % (ref 11.5–15.5)
WBC: 6.9 10*3/uL (ref 4.0–10.5)
nRBC: 0 % (ref 0.0–0.2)

## 2021-08-05 LAB — COMPREHENSIVE METABOLIC PANEL
ALT: 18 U/L (ref 0–44)
AST: 25 U/L (ref 15–41)
Albumin: 4.5 g/dL (ref 3.5–5.0)
Alkaline Phosphatase: 55 U/L (ref 38–126)
Anion gap: 10 (ref 5–15)
BUN: 15 mg/dL (ref 8–23)
CO2: 25 mmol/L (ref 22–32)
Calcium: 9.5 mg/dL (ref 8.9–10.3)
Chloride: 101 mmol/L (ref 98–111)
Creatinine, Ser: 0.66 mg/dL (ref 0.44–1.00)
GFR, Estimated: >60 mL/min (ref >60)
Glucose, Bld: 94 mg/dL (ref 70–99)
Potassium: 4 mmol/L (ref 3.5–5.1)
Sodium: 136 mmol/L (ref 135–145)
Total Bilirubin: 0.5 mg/dL (ref 0.3–1.2)
Total Protein: 7.5 g/dL (ref 6.5–8.1)

## 2021-08-05 MED ORDER — SODIUM CHLORIDE 0.9% FLUSH
10.0000 mL | Freq: Once | INTRAVENOUS | Status: AC
  Administered 2021-08-05: 10 mL via INTRAVENOUS

## 2021-08-05 MED ORDER — HEPARIN SOD (PORK) LOCK FLUSH 100 UNIT/ML IV SOLN
500.0000 [IU] | Freq: Once | INTRAVENOUS | Status: AC
Start: 2021-08-05 — End: 2021-08-05
  Administered 2021-08-05: 500 [IU] via INTRAVENOUS

## 2021-08-05 MED ORDER — HEPARIN SOD (PORK) LOCK FLUSH 100 UNIT/ML IV SOLN: 500.0000 [IU] | Freq: Once | INTRAVENOUS | Status: DC

## 2021-08-05 MED ORDER — IOHEXOL 300 MG/ML  SOLN
100.0000 mL | Freq: Once | INTRAMUSCULAR | Status: AC | PRN
  Administered 2021-08-05: 100 mL via INTRAVENOUS

## 2021-08-06 LAB — CA 125: Cancer Antigen (CA) 125: 32.3 U/mL (ref 0.0–38.1)

## 2021-08-12 ENCOUNTER — Other Ambulatory Visit: Payer: Self-pay

## 2021-08-12 ENCOUNTER — Inpatient Hospital Stay (HOSPITAL_COMMUNITY): Payer: Medicare Other | Admitting: Hematology

## 2021-08-12 ENCOUNTER — Other Ambulatory Visit (HOSPITAL_COMMUNITY): Payer: Self-pay | Admitting: *Deleted

## 2021-08-12 DIAGNOSIS — C561 Malignant neoplasm of right ovary: Secondary | ICD-10-CM | POA: Diagnosis present

## 2021-08-12 DIAGNOSIS — Z90710 Acquired absence of both cervix and uterus: Secondary | ICD-10-CM | POA: Diagnosis not present

## 2021-08-12 DIAGNOSIS — Z885 Allergy status to narcotic agent status: Secondary | ICD-10-CM | POA: Diagnosis not present

## 2021-08-12 DIAGNOSIS — Z79899 Other long term (current) drug therapy: Secondary | ICD-10-CM | POA: Diagnosis not present

## 2021-08-12 DIAGNOSIS — I4891 Unspecified atrial fibrillation: Secondary | ICD-10-CM | POA: Diagnosis not present

## 2021-08-12 DIAGNOSIS — K449 Diaphragmatic hernia without obstruction or gangrene: Secondary | ICD-10-CM | POA: Diagnosis not present

## 2021-08-12 DIAGNOSIS — Z8379 Family history of other diseases of the digestive system: Secondary | ICD-10-CM | POA: Diagnosis not present

## 2021-08-12 DIAGNOSIS — Z9049 Acquired absence of other specified parts of digestive tract: Secondary | ICD-10-CM | POA: Diagnosis not present

## 2021-08-12 DIAGNOSIS — G629 Polyneuropathy, unspecified: Secondary | ICD-10-CM | POA: Diagnosis not present

## 2021-08-12 DIAGNOSIS — M4316 Spondylolisthesis, lumbar region: Secondary | ICD-10-CM | POA: Diagnosis not present

## 2021-08-12 DIAGNOSIS — M48061 Spinal stenosis, lumbar region without neurogenic claudication: Secondary | ICD-10-CM | POA: Diagnosis not present

## 2021-08-12 DIAGNOSIS — M25529 Pain in unspecified elbow: Secondary | ICD-10-CM | POA: Diagnosis not present

## 2021-08-12 DIAGNOSIS — Z888 Allergy status to other drugs, medicaments and biological substances status: Secondary | ICD-10-CM | POA: Diagnosis not present

## 2021-08-12 DIAGNOSIS — Z90722 Acquired absence of ovaries, bilateral: Secondary | ICD-10-CM | POA: Diagnosis not present

## 2021-08-12 DIAGNOSIS — M5136 Other intervertebral disc degeneration, lumbar region: Secondary | ICD-10-CM | POA: Diagnosis not present

## 2021-08-12 DIAGNOSIS — M47816 Spondylosis without myelopathy or radiculopathy, lumbar region: Secondary | ICD-10-CM | POA: Diagnosis not present

## 2021-08-12 DIAGNOSIS — K5730 Diverticulosis of large intestine without perforation or abscess without bleeding: Secondary | ICD-10-CM | POA: Diagnosis not present

## 2021-08-12 DIAGNOSIS — K91841 Postprocedural hemorrhage and hematoma of a digestive system organ or structure following other procedure: Secondary | ICD-10-CM | POA: Diagnosis not present

## 2021-08-12 DIAGNOSIS — Z808 Family history of malignant neoplasm of other organs or systems: Secondary | ICD-10-CM | POA: Diagnosis not present

## 2021-08-12 DIAGNOSIS — Z836 Family history of other diseases of the respiratory system: Secondary | ICD-10-CM | POA: Diagnosis not present

## 2021-08-12 DIAGNOSIS — M2550 Pain in unspecified joint: Secondary | ICD-10-CM | POA: Diagnosis not present

## 2021-08-12 DIAGNOSIS — Z8249 Family history of ischemic heart disease and other diseases of the circulatory system: Secondary | ICD-10-CM | POA: Diagnosis not present

## 2021-08-12 DIAGNOSIS — I700 Atherosclerosis of aorta: Secondary | ICD-10-CM | POA: Diagnosis not present

## 2021-08-12 NOTE — Progress Notes (Signed)
April Guerra, April Guerra [id number]   CLINIC:  Medical Oncology/Hematology  PCP:  Manon Hilding, MD [address] [place] [address] [phone]   REASON FOR VISIT:  Follow-up for right ovarian cancer  PRIOR THERAPY:  1. TAH-BSO on 05/03/2020. 2. Carboplatin, paclitaxel & Aloxi x 6 cycles from 06/06/2020 to 09/19/2020.  NGS Results: not done  CURRENT THERAPY: surveillance  BRIEF ONCOLOGIC HISTORY:  Oncology History  Right ovarian epithelial cancer ([place])  05/03/2020 Initial Diagnosis   Right ovarian epithelial cancer ([place])   05/03/2020 Cancer Staging   Staging form: Ovary, Fallopian Tube, and Primary Peritoneal Carcinoma, AJCC 8th Edition - Clinical stage from 05/03/2020: FIGO Stage II, calculated as Stage Unknown (cT2b, cNX, cM0) - Signed by Everitt Amber, MD on 05/24/2020    06/06/2020 -  Chemotherapy    Patient is on Treatment Plan: OVARIAN CARBOPLATIN (AUC 6) / PACLITAXEL (175) Q21D X 6 CYCLES        Genetic Testing   Negative genetic testing. No pathogenic variants identified on the [employer]. The report date is 08/10/2020.  The CancerNext gene panel offered by [employer] includes sequencing and rearrangement analysis for the following 36 genes: APC*, ATM*, AXIN2, BARD1, BMPR1A, BRCA1*, BRCA2*, BRIP1*, CDH1*, CDK4, CDKN2A, CHEK2*, DICER1, MLH1*, MSH2*, MSH3, MSH6*, MUTYH*, NBN, NF1*, NTHL1, PALB2*, PMS2*, PTEN*, RAD51C*, RAD51D*, RECQL, SMAD4, SMARCA4, STK11 and TP53* (sequencing and deletion/duplication); HOXB13, POLD1 and POLE (sequencing only); EPCAM and GREM1 (deletion/duplication only).   Somatic genes analyzed through TumorNext-HRD: ATM, BARD1, BRCA1, BRCA2, BRIP1, CHEK2, MRE11A, NBN, PALB2, RAD51C, RAD51D.     CANCER STAGING:  Cancer Staging  Right ovarian epithelial cancer [facility]) Staging form: Ovary, Fallopian Tube, and Primary Peritoneal Carcinoma, AJCC 8th Edition - Clinical stage from  05/03/2020: FIGO Stage II, calculated as Stage Unknown (cT2b, cNX, cM0) - Signed by Everitt Amber, MD on 05/24/2020   INTERVAL HISTORY:  April Guerra, a 76 y.o. female, returns for routine follow-up of her right ovarian cancer. April Guerra was last seen on 05/06/2021.   Today she reports feeling good. She reports continues tingling/numbness and pain in her hands radiating up to her elbow which disrupts her sleep; this is helped by 4 Gabapentin tablets daily.   REVIEW OF SYSTEMS:  Review of Systems  Constitutional:  Negative for appetite change and fatigue.  Genitourinary:  Positive for frequency.   Musculoskeletal:  Positive for arthralgias (7/10 hands and feet).  Neurological:  Positive for numbness.  All other systems reviewed and are negative.  PAST MEDICAL/SURGICAL HISTORY:  Past Medical History:  Diagnosis Date   A-fib ([place])    Anxiety    Arthritis    Asthma    hx of    Cancer ([place])    ovarian   Dyspnea    with exertion    H/O seasonal allergies    Heart murmur    as aa child    Hypercholesteremia    Hypertension    Spinal stenosis    Past Surgical History:  Procedure Laterality Date   ABDOMINAL HYSTERECTOMY     BIOPSY  05/08/2020   Procedure: BIOPSY;  Surgeon: Harvel Quale, MD;  [facility];  Service: Gastroenterology;;   BREAST SURGERY     CARDIAC CATHETERIZATION     CHOLECYSTECTOMY     COLONOSCOPY N/A 08/14/2015   Procedure: COLONOSCOPY;  Surgeon: Aviva Signs, MD;  [facility]: [facility];  Service: Gastroenterology;  Laterality: N/A;  ESOPHAGOGASTRODUODENOSCOPY (EGD) WITH PROPOFOL N/A 05/08/2020   Procedure: ESOPHAGOGASTRODUODENOSCOPY (EGD) WITH PROPOFOL;  Surgeon: Harvel Quale, MD;  [facility];  Service: Gastroenterology;  Laterality: N/A;   ESOPHAGOGASTRODUODENOSCOPY (EGD) WITH PROPOFOL N/A 08/07/2020   Procedure: ESOPHAGOGASTRODUODENOSCOPY (EGD) WITH PROPOFOL;  Surgeon: Harvel Quale, MD;  [facility]:  [facility];  Service: Gastroenterology;  Laterality: N/A;  7:30   IR IMAGING GUIDED PORT INSERTION  06/04/2020   ROBOTIC ASSISTED TOTAL HYSTERECTOMY WITH BILATERAL SALPINGO OOPHERECTOMY N/A 05/03/2020   Procedure: XI ROBOTIC ASSISTED TOTAL HYSTERECTOMY WITH BILATERAL SALPINGO OOPHORECTOMY, OMENTECTOMY,RADICAL TUMOR DEBULKING, RIGID PROSTOSCOPY;  Surgeon: Everitt Amber, MD;  Location: [facility];  Service: Gynecology;  Laterality: N/A;   THROMBECTOMY FEMORAL ARTERY Right 12/17/2013   Procedure: REPAIR OF RIGHT FEMORAL ARTERY PSEUDOANEURYSM;  Surgeon: Elam Dutch, MD;  Location: [facility];  Service: Vascular;  Laterality: Right;    SOCIAL HISTORY:  Social History   Socioeconomic History   Marital status: Married    Spouse name: Not on file   Number of children: 4   Years of education: Not on file   Highest education level: Not on file  Occupational History   Occupation: retired Marine scientist  Tobacco Use   Smoking status: Never   Smokeless tobacco: Never  Vaping Use   Vaping Use: Never used  Substance and Sexual Activity   Alcohol use: No   Drug use: No   Sexual activity: Not Currently  Other Topics Concern   Not on file  Social History Narrative   Not on file   Social Determinants of Health   Financial Resource Strain: Not on file  Food Insecurity: Not on file  Transportation Needs: Not on file  Physical Activity: Not on file  Stress: Not on file  Social Connections: Not on file  Intimate Partner Violence: Not on file    FAMILY HISTORY:  Family History  Problem Relation Age of Onset   Hypertension Mother    Heart failure Mother    Asthma Mother    Ulcers Mother    Benign prostatic hyperplasia Father    Ulcers Father    Esophageal varices Father    Atrial fibrillation Sister    Atrial fibrillation Brother    Heart disease Brother    Melanoma Maternal Aunt    Colon cancer Neg Hx     CURRENT MEDICATIONS:  Current Outpatient Medications  Medication Sig Dispense Refill    acetaminophen (TYLENOL) 500 MG tablet Take 1,000 mg by mouth 3 (three) times daily.     albuterol (VENTOLIN HFA) 108 (90 Base) MCG/ACT inhaler Inhale 2 puffs into the lungs every 4 (four) hours as needed for wheezing or shortness of breath. 18 g 2   ALPHA LIPOIC ACID PO Take by mouth.     amLODipine (NORVASC) 10 MG tablet Take 1 tablet (10 mg total) by mouth daily with lunch. For BP 30 tablet 3   aspirin EC 81 MG tablet Take 81 mg by mouth daily. Swallow whole.     b complex vitamins capsule Take 1 capsule by mouth at bedtime.     Calcium Carb-Cholecalciferol (CALCIUM 600/VITAMIN D PO) Take 600 mg by mouth in the morning and at bedtime.     cetirizine (ZYRTEC) 10 MG tablet Take 10 mg by mouth daily.     Cholecalciferol (VITAMIN D) 125 MCG (5000 UT) CAPS Take 5,000 Units by mouth daily.     Coenzyme Q10 (COQ10) 100 MG CAPS Take 100 mg by mouth at bedtime.  cyclobenzaprine (FLEXERIL) 10 MG tablet Take 5 mg by mouth See admin instructions. Take 5 mg at night, may take a second 5 mg dose during the day as needed for muscle spasms     diltiazem (CARDIZEM) 30 MG tablet Take 1 tablet every 4 hours AS NEEDED for AFIB heart rate >100 (Patient taking differently: Take 30 mg by mouth every 4 (four) hours as needed (AFIB heart rate > 100).) 30 tablet 3   famotidine (PEPCID) 40 MG tablet Take 40 mg by mouth daily.     fluticasone (FLONASE) 50 MCG/ACT nasal spray Place 1 spray into both nostrils daily.      gabapentin (NEURONTIN) 300 MG capsule TAKE 1 CAPSULE BY MOUTH 3  TIMES DAILY (Patient taking differently: 4 (four) times daily.) 270 capsule 3   LAGEVRIO 200 MG CAPS capsule SMARTSIG:4 Capsule(s) By Mouth Every 12 Hours     lidocaine-prilocaine (EMLA) cream Apply to affected area once 30 g 3   lisinopril (ZESTRIL) 30 MG tablet Take 1 tablet (30 mg total) by mouth in the morning. Okay to restart around Monday, 05/14/2020 if blood pressure stable (Patient taking differently: Take 30 mg by mouth in the  morning.) 30 tablet 3   Magnesium 100 MG TABS Take 50 mg by mouth at bedtime.     melatonin 5 MG TABS Take 2.5 mg by mouth at bedtime as needed (Sleep).     metoprolol succinate (TOPROL-XL) 100 MG 24 hr tablet Take 100 mg by mouth in the morning. Take with or immediately following a meal.     Multiple Vitamins-Minerals (CENTRUM SILVER ADULT 50+ PO) Take 1 tablet by mouth daily.     Omega-3 Fatty Acids (FISH OIL) 1000 MG CAPS Take 1,000 mg by mouth daily.     ondansetron (ZOFRAN) 4 MG tablet Take 1 tablet (4 mg total) by mouth every 6 (six) hours as needed for nausea. 20 tablet 0   polyethylene glycol powder (GLYCOLAX/MIRALAX) 17 GM/SCOOP powder Take 17 g by mouth daily as needed for mild constipation or moderate constipation (With lunch).     prochlorperazine (COMPAZINE) 10 MG tablet Take 1 tablet (10 mg total) by mouth every 6 (six) hours as needed (Nausea or vomiting). 30 tablet 1   senna (SENOKOT) 8.6 MG tablet Take 1 tablet by mouth daily as needed for constipation.     simvastatin (ZOCOR) 20 MG tablet Take 20 mg by mouth at bedtime.     sodium chloride (OCEAN) 0.65 % SOLN nasal spray Place 1 spray into both nostrils See admin instructions. Use 1 spray in each nostril in the morning may use a second dose at night as needed for congestion     tetrahydrozoline-zinc (VISINE-AC) 0.05-0.25 % ophthalmic solution Place 1 drop into both eyes daily.     triamcinolone (KENALOG) 0.1 % Apply 1 application topically daily as needed (irritation).     Turmeric (QC TUMERIC COMPLEX PO) Take by mouth.     vitamin C (ASCORBIC ACID) 500 MG tablet Take 1,000 mg by mouth 2 (two) times daily.     Vitamins A & D (VITAMIN A & D) ointment Apply 1 application topically daily as needed for dry skin (irritation).     pantoprazole (PROTONIX) 40 MG tablet Take 1 tablet (40 mg total) by mouth daily. 90 tablet 3   No current facility-administered medications for this visit.    ALLERGIES:  Allergies  Allergen Reactions    Morphine And Related Nausea And Vomiting   Zofran [Ondansetron]  Causes minor constipation, tolerates low doses      PHYSICAL EXAM:  Performance status (ECOG): 1 - Symptomatic but completely ambulatory  There were no vitals filed for this visit. Wt Readings from Last 3 Encounters:  08/05/21 179 lb 6.4 oz (81.4 kg)  05/09/21 174 lb 9.6 oz (79.2 kg)  05/06/21 174 lb 12.8 oz (79.3 kg)   Physical Exam Vitals reviewed.  Constitutional:      Appearance: Normal appearance.  Cardiovascular:     Rate and Rhythm: Normal rate and regular rhythm.     Pulses: Normal pulses.     Heart sounds: Normal heart sounds.  Pulmonary:     Effort: Pulmonary effort is normal.     Breath sounds: Normal breath sounds.  Abdominal:     Palpations: Abdomen is soft. There is no hepatomegaly, splenomegaly or mass.     Tenderness: There is no abdominal tenderness.  Lymphadenopathy:     Lower Body: No right inguinal adenopathy. No left inguinal adenopathy.  Neurological:     General: No focal deficit present.     Mental Status: She is alert and oriented to person, place, and time.  Psychiatric:        Mood and Affect: Mood normal.        Behavior: Behavior normal.     LABORATORY DATA:  I have reviewed the labs as listed.  CBC Latest Ref Rng & Units 08/05/2021 04/30/2021 01/22/2021  WBC 4.0 - 10.5 K/uL 6.9 7.2 9.4  Hemoglobin 12.0 - 15.0 g/dL 12.7 12.9 12.8  Hematocrit 36.0 - 46.0 % 37.4 36.5 38.8  Platelets 150 - 400 K/uL 323 315 297   CMP Latest Ref Rng & Units 08/05/2021 04/30/2021 01/22/2021  Glucose 70 - 99 mg/dL 94 94 92  BUN 8 - 23 mg/dL 15 17 19   Creatinine 0.44 - 1.00 mg/dL 0.66 0.70 0.71  Sodium 135 - 145 mmol/L 136 133(L) 135  Potassium 3.5 - 5.1 mmol/L 4.0 3.6 4.0  Chloride 98 - 111 mmol/L 101 100 101  CO2 22 - 32 mmol/L 25 23 27   Calcium 8.9 - 10.3 mg/dL 9.5 9.1 9.2  Total Protein 6.5 - 8.1 g/dL 7.5 7.6 6.9  Total Bilirubin 0.3 - 1.2 mg/dL 0.5 0.4 0.9  Alkaline Phos 38 - 126 U/L 55 62 45   AST 15 - 41 U/L 25 26 19   ALT 0 - 44 U/L 18 22 22     DIAGNOSTIC IMAGING:  I have independently reviewed the scans and discussed with the patient. CT Abdomen Pelvis W Contrast  Result Date: 08/06/2021 CLINICAL DATA:  Restaging ovarian cancer. Prior hysterectomy and oophorectomy. EXAM: CT ABDOMEN AND PELVIS WITH CONTRAST TECHNIQUE: Multidetector CT imaging of the abdomen and pelvis was performed using the standard protocol following bolus administration of intravenous contrast. RADIATION DOSE REDUCTION: This exam was performed according to the departmental dose-optimization program which includes automated exposure control, adjustment of the mA and/or kV according to patient size and/or use of iterative reconstruction technique. CONTRAST:  169m OMNIPAQUE IOHEXOL 300 MG/ML  SOLN COMPARISON:  10/16/2020 FINDINGS: Lower chest: Descending thoracic aortic atherosclerotic calcification. Mild cardiomegaly. Small type 1 hiatal hernia. Hepatobiliary: Cholecystectomy.  Otherwise unremarkable. Pancreas: Unremarkable Spleen: Unremarkable Adrenals/Urinary Tract: Scarring in the right kidney lower pole. There is likely some scarring in the left mid kidney posteriorly. Adrenal glands unremarkable. No urinary tract calculi are observed. Stomach/Bowel: Very small diverticulum of the gastric fundus posteriorly. Sigmoid colon diverticulosis. Prominent stool throughout the colon favors constipation. No dilated small bowel.  Vascular/Lymphatic: Atherosclerosis is present, including aortoiliac atherosclerotic disease. No pathologic adenopathy is observed. Reproductive: Uterus absent. No substantial soft tissue mass along the vaginal cuff. Adnexa unremarkable. Other: No ascites or substantial peritoneal/omental nodularity currently identified. Musculoskeletal: Mild degenerative chondral thinning in both hips. Lower thoracic and lumbar spondylosis and degenerative disc disease, with 11 mm of degenerative anterolisthesis at the L4-5  level contributing to substantial central narrowing of the thecal sac and mild bilateral foraminal stenosis at this level. There is also foraminal impingement at L3-4 and L5-S1. IMPRESSION: 1. No current findings of active malignancy. 2. Other imaging findings of potential clinical significance: Aortic Atherosclerosis (ICD10-I70.0). Systemic atherosclerosis. Mild cardiomegaly. Small type 1 hiatal hernia. Prominent stool throughout the colon favors constipation. Sigmoid colon diverticulosis. Lumbar impingement at L3-4, L4-5, and L5-S1. Electronically Signed   By: Van Clines M.D.   On: 08/06/2021 06:49     ASSESSMENT:  1.  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 -[phone] on 04/20/2020 -Evaluated by Dr. Denman George on 05/24/2020. -6 cycles of carboplatin and paclitaxel was recommended. -Somatic and germline mutation testing was negative. -6 cycles of carboplatin and paclitaxel from 06/06/2020 through 09/19/2020.   2.  Atrial fibrillation: -Was on Eliquis. -She had postoperative GI bleed when Eliquis was discontinued.   PLAN:  1.  Stage II clear-cell right ovarian cancer: -She does not report any abdominal pain or bloating. - Physical exam was within normal limits. - Labs on 08/05/2021 which shows normal LFTs, CBC.  CA125 was 32.3. - Reviewed CT [facility] with contrast from 08/05/2021 which did not show any sign of malignancy. - I have recommended her to follow-up with Dr. Berline Lopes. - I will see her back in 6 months with repeat CTAP and CA125 level.   2.  Peripheral neuropathy: -She has neuropathy in the fingers and feet from chemotherapy. - She also reports pain in the bilateral forearms if she lifts weights. - She is currently taking gabapentin 300 mg-300 mg - 600 mg daily which is helping. - She has follow-up appointment with neurology.   3.  Genetic testing: -Somatic and germline testing was  negative.  4.  Low back pain: -MRI on 05/07/2021 showed severe multifactorial spinal and bilateral lateral recess stenosis at L4-5. - This has improved lately.   Orders placed this encounter:  Orders Placed This Encounter  Procedures   CT Abdomen Pelvis [place], MD [place] [phone]   I, Thana Ates, am acting as a scribe for Dr. Derek Jack.  I, Derek Jack MD, have reviewed the above documentation for accuracy and completeness, and I agree with the above.

## 2021-08-12 NOTE — Patient Instructions (Signed)
[place] at [facility] Discharge Instructions   You were seen and examined today by Dr. Delton Coombes.  He reviewed the results of your lab work and scans.  Your tumor marker is normal and your CT scan did not show any evidence of cancer.   We will refer you back to Dr. Berline Lopes who took over for Dr. Denman George.  Return as scheduled in 6 months.    Thank you for choosing [place] at [facility] to provide your oncology and hematology care.  To afford each patient quality time with our provider, please arrive at least 15 minutes before your scheduled appointment time.   If you have a lab appointment with the [facility] please come in thru the Main Entrance and check in at the main information desk.  You need to re-schedule your appointment should you arrive 10 or more minutes late.  We strive to give you quality time with our providers, and arriving late affects you and other patients whose appointments are after yours.  Also, if you no show three or more times for appointments you may be dismissed from the clinic at the providers discretion.     Again, thank you for choosing [facility].  Our hope is that these requests will decrease the amount of time that you wait before being seen by our physicians.       _____________________________________________________________  Should you have questions after your visit to [facility], please contact our office at [phone] and follow the prompts.  Our office hours are 8:00 a.m. and 4:30 p.m. Monday - Friday.  Please note that voicemails left after 4:00 p.m. may not be returned until the following business day.  We are closed weekends and major holidays.  You do have access to a nurse 24-7, just call the main number to the clinic [phone] and do not press any options, hold on the line and a nurse will answer the phone.    For prescription refill requests, have your pharmacy  contact our office and allow 72 hours.    Due to Covid, you will need to wear a mask upon entering the hospital. If you do not have a mask, a mask will be given to you at the Main Entrance upon arrival. For doctor visits, patients may have 1 support person age 35 or older with them. For treatment visits, patients can not have anyone with them due to social distancing guidelines and our immunocompromised population.

## 2021-08-14 ENCOUNTER — Telehealth: Payer: Self-pay

## 2021-08-14 NOTE — Telephone Encounter (Signed)
Patient LM for return call for a f/u with Dr. Berline Lopes.  Scheduled appointment for Monday March 6th @ 0845.

## 2021-08-15 ENCOUNTER — Telehealth: Payer: Self-pay | Admitting: *Deleted

## 2021-08-15 NOTE — Telephone Encounter (Signed)
Called and left the patient a message to call the office back. Per Dr Berline Lopes need to reschedule the 3/6 appt later in the day (12:45 pm or 4:15 pm)

## 2021-08-15 NOTE — Telephone Encounter (Signed)
Spoke with pt this afternoon regarding rescheduling her appointment. She is agreeable to reschedule her appointment to 3/6 @ 12:45pm. Pt verbalized understanding.

## 2021-08-20 ENCOUNTER — Ambulatory Visit: Payer: Medicare Other | Admitting: Neurology

## 2021-08-20 ENCOUNTER — Encounter: Payer: Self-pay | Admitting: Neurology

## 2021-08-20 VITALS — BP 161/90 | HR 66 | Ht <= 58 in | Wt 179.5 lb

## 2021-08-20 DIAGNOSIS — M545 Low back pain, unspecified: Secondary | ICD-10-CM | POA: Insufficient documentation

## 2021-08-20 DIAGNOSIS — R202 Paresthesia of skin: Secondary | ICD-10-CM | POA: Diagnosis not present

## 2021-08-20 MED ORDER — GABAPENTIN 300 MG PO CAPS
600.0000 mg | ORAL_CAPSULE | Freq: Three times a day (TID) | ORAL | 3 refills | Status: DC
Start: 2021-08-20 — End: 2021-11-26

## 2021-08-20 MED ORDER — GABAPENTIN 300 MG PO CAPS: 600.0000 mg | ORAL_CAPSULE | Freq: Three times a day (TID) | ORAL | 3 refills | Status: DC

## 2021-08-20 NOTE — Progress Notes (Signed)
Chief Complaint  Patient presents with   New Patient (Initial Visit)    Room 14 with husband, Fulton Reek. Referral for drug induced polyneuropathy (following chemo). Progressive symptoms in bilateral hands and feet. Gabapentin 300mg , one cap TID is mildly helpful. No adverse side effects on this dosage.       ASSESSMENT AND PLAN  April Guerra is a 76 y.o. female   Slow worsening bilateral upper and lower extremity paresthesia known history of lumbar degenerative disc disease, severe spinal stenosis at L4-5,  Started since chemotherapy in December 2021, but continued worsening even after stopped taking chemotherapy in March 2022, especially right arm,  Differentiation diagnosis include peripheral neuropathy, carpal tunnel syndrome, with superimposed lumbosacral radiculopathy  EMG nerve conduction study  Higher dose of gabapentin up to 300 mg 2 tablets 3 times a day, right wrist splint   DIAGNOSTIC DATA (LABS, IMAGING, TESTING) - I reviewed patient records, labs, notes, testing and imaging myself where available.   MEDICAL HISTORY:  April Guerra, is a 76 year old female, seen in request by her primary care physician Dr. Quintin Alto, Silvestre Moment, for evaluation of bilateral hand and feet paresthesia, initial evaluation was on August 20, 2021,  I reviewed and summarized the referring note. PMHx. HTN History of bleeding Gastric ulcer, Nov 2021. HLD Proximal A fib, on ASA   She had a history of ovarian cancer, had total hysterectomy in November 2021 followed by chemotherapy, Carboplatin, paclitaxel & Aloxi x 6 cycles from 06/06/2020 to 09/19/2020.  After the first round of chemotherapy, she began to noticed numbness from bilateral knee down, also bilateral hands numbness tingling  However since chemotherapy stopped in March 2022, her symptoms continue to progress, especially over the past few months, she complains of intermittent right hand numbness tingling from right shoulder down, especially  if she overuses her right hand, such as cutting bushes, mostly involving right lateral 3 fingers,  She had long history of chronic low back pain, known history of lumbar degenerative disc disease,   I personally reviewed MRI of lumbar spine November 2022: Severe multifactorial spinal and bilateral lateral recess stenosis at L4-5, mild bilateral foraminal narrowing at L5-S1,  She denies significant gait abnormality, but complains of numbness, stinging sensation at the bottom of her feet, slow worsening urinary frequency, urgency, has to wear pads  PHYSICAL EXAM:   Vitals:   08/20/21 0815  BP: (!) 161/90  Pulse: 66  Weight: 179 lb 8 oz (81.4 kg)  Height: 4\' 10"  (1.473 m)   Not recorded     Body mass index is 37.52 kg/m.  PHYSICAL EXAMNIATION:  Gen: NAD, conversant, well nourised, well groomed                     Cardiovascular: Regular rate rhythm, no peripheral edema, warm, nontender. Eyes: Conjunctivae clear without exudates or hemorrhage Neck: Supple, no carotid bruits. Pulmonary: Clear to auscultation bilaterally   NEUROLOGICAL EXAM:  MENTAL STATUS: Speech:    Speech is normal; fluent and spontaneous with normal comprehension.  Cognition:     Orientation to time, place and person     Normal recent and remote memory     Normal Attention span and concentration     Normal Language, naming, repeating,spontaneous speech     Fund of knowledge   CRANIAL NERVES: CN II: Visual fields are full to confrontation. Pupils are round equal and briskly reactive to light. CN III, IV, VI: extraocular movement are normal. No ptosis. CN V:  Facial sensation is intact to light touch CN VII: Face is symmetric with normal eye closure  CN VIII: Hearing is normal to causal conversation. CN IX, X: Phonation is normal. CN XI: Head turning and shoulder shrug are intact  MOTOR: Bilateral upper extremity proximal and distal muscle strength is normal, bilateral lower extremity proximal muscle  strength is normal, she has mild left more than right ankle dorsiflexion weakness, mild to moderate bilateral toe flexion/toe extension weakness  REFLEXES: Reflexes are 1 and symmetric at the biceps, triceps, absent at knees, and ankles. Plantar responses are flexor.  SENSORY: Mildly length dependent decreased light touch, pinprick  COORDINATION: There is no trunk or limb dysmetria noted.  GAIT/STANCE: She can get up from seated position arm crossed, mildly unsteady, could not stand up on heels, better on tiptoes,  REVIEW OF SYSTEMS:  Full 14 system review of systems performed and notable only for as above All other review of systems were negative.   ALLERGIES: Allergies  Allergen Reactions   Morphine And Related Nausea And Vomiting   Zofran [Ondansetron]     Causes minor constipation, tolerates low doses      HOME MEDICATIONS: Current Outpatient Medications  Medication Sig Dispense Refill   acetaminophen (TYLENOL) 500 MG tablet Take 1,000 mg by mouth 3 (three) times daily.     albuterol (VENTOLIN HFA) 108 (90 Base) MCG/ACT inhaler Inhale 2 puffs into the lungs every 4 (four) hours as needed for wheezing or shortness of breath. 18 g 2   ALPHA LIPOIC ACID PO Take by mouth.     amLODipine (NORVASC) 10 MG tablet Take 1 tablet (10 mg total) by mouth daily with lunch. For BP 30 tablet 3   aspirin EC 81 MG tablet Take 81 mg by mouth daily. Swallow whole.     b complex vitamins capsule Take 1 capsule by mouth at bedtime.     Calcium Carb-Cholecalciferol (CALCIUM 600/VITAMIN D PO) Take 600 mg by mouth in the morning and at bedtime.     cetirizine (ZYRTEC) 10 MG tablet Take 10 mg by mouth daily.     Cholecalciferol (VITAMIN D) 125 MCG (5000 UT) CAPS Take 5,000 Units by mouth daily.     Coenzyme Q10 (COQ10) 100 MG CAPS Take 100 mg by mouth at bedtime.      cyclobenzaprine (FLEXERIL) 10 MG tablet Take 5 mg by mouth See admin instructions. Take 5 mg at night, may take a second 5 mg dose  during the day as needed for muscle spasms     diltiazem (CARDIZEM) 30 MG tablet Take 1 tablet every 4 hours AS NEEDED for AFIB heart rate >100 (Patient taking differently: Take 30 mg by mouth every 4 (four) hours as needed (AFIB heart rate > 100).) 30 tablet 3   famotidine (PEPCID) 40 MG tablet Take 40 mg by mouth daily.     fluticasone (FLONASE) 50 MCG/ACT nasal spray Place 1 spray into both nostrils daily.      gabapentin (NEURONTIN) 300 MG capsule TAKE 1 CAPSULE BY MOUTH 3  TIMES DAILY (Patient taking differently: 4 (four) times daily.) 270 capsule 3   lidocaine-prilocaine (EMLA) cream Apply to affected area once 30 g 3   lisinopril (ZESTRIL) 30 MG tablet Take 1 tablet (30 mg total) by mouth in the morning. Okay to restart around Monday, 05/14/2020 if blood pressure stable (Patient taking differently: Take 30 mg by mouth in the morning.) 30 tablet 3   Magnesium 100 MG TABS Take 50 mg by  mouth at bedtime.     melatonin 5 MG TABS Take 2.5 mg by mouth at bedtime as needed (Sleep).     metoprolol succinate (TOPROL-XL) 100 MG 24 hr tablet Take 100 mg by mouth in the morning. Take with or immediately following a meal.     Multiple Vitamins-Minerals (CENTRUM SILVER ADULT 50+ PO) Take 1 tablet by mouth daily.     Omega-3 Fatty Acids (FISH OIL) 1000 MG CAPS Take 1,000 mg by mouth daily.     ondansetron (ZOFRAN) 4 MG tablet Take 1 tablet (4 mg total) by mouth every 6 (six) hours as needed for nausea. 20 tablet 0   polyethylene glycol powder (GLYCOLAX/MIRALAX) 17 GM/SCOOP powder Take 17 g by mouth daily as needed for mild constipation or moderate constipation (With lunch).     prochlorperazine (COMPAZINE) 10 MG tablet Take 1 tablet (10 mg total) by mouth every 6 (six) hours as needed (Nausea or vomiting). 30 tablet 1   senna (SENOKOT) 8.6 MG tablet Take 1 tablet by mouth daily as needed for constipation.     simvastatin (ZOCOR) 20 MG tablet Take 20 mg by mouth at bedtime.     sodium chloride (OCEAN) 0.65 %  SOLN nasal spray Place 1 spray into both nostrils See admin instructions. Use 1 spray in each nostril in the morning may use a second dose at night as needed for congestion     tetrahydrozoline-zinc (VISINE-AC) 0.05-0.25 % ophthalmic solution Place 1 drop into both eyes daily.     triamcinolone (KENALOG) 0.1 % Apply 1 application topically daily as needed (irritation).     Turmeric (QC TUMERIC COMPLEX PO) Take by mouth.     vitamin C (ASCORBIC ACID) 500 MG tablet Take 1,000 mg by mouth 2 (two) times daily.     Vitamins A & D (VITAMIN A & D) ointment Apply 1 application topically daily as needed for dry skin (irritation).     pantoprazole (PROTONIX) 40 MG tablet Take 1 tablet (40 mg total) by mouth daily. 90 tablet 3   No current facility-administered medications for this visit.    PAST MEDICAL HISTORY: Past Medical History:  Diagnosis Date   A-fib ([place])    Anxiety    Arthritis    Asthma    hx of    Cancer ([place])    ovarian   Dyspnea    with exertion    H/O seasonal allergies    Heart murmur    as aa child    Hypercholesteremia    Hypertension    Spinal stenosis     PAST SURGICAL HISTORY: Past Surgical History:  Procedure Laterality Date   ABDOMINAL HYSTERECTOMY     BIOPSY  05/08/2020   Procedure: BIOPSY;  Surgeon: Harvel Quale, MD;  Location: [facility];  Service: Gastroenterology;;   BREAST SURGERY     CARDIAC CATHETERIZATION     CHOLECYSTECTOMY     COLONOSCOPY N/A 08/14/2015   Procedure: COLONOSCOPY;  Surgeon: Aviva Signs, MD;  Location: [facility];  Service: Gastroenterology;  Laterality: N/A;   ESOPHAGOGASTRODUODENOSCOPY (EGD) WITH PROPOFOL N/A 05/08/2020   Procedure: ESOPHAGOGASTRODUODENOSCOPY (EGD) WITH PROPOFOL;  Surgeon: Harvel Quale, MD;  Location: [facility];  Service: Gastroenterology;  Laterality: N/A;   ESOPHAGOGASTRODUODENOSCOPY (EGD) WITH PROPOFOL N/A 08/07/2020   Procedure: ESOPHAGOGASTRODUODENOSCOPY (EGD) WITH PROPOFOL;   Surgeon: Harvel Quale, MD;  Location: [facility];  Service: Gastroenterology;  Laterality: N/A;  7:30   IR IMAGING GUIDED PORT INSERTION  06/04/2020  ROBOTIC ASSISTED TOTAL HYSTERECTOMY WITH BILATERAL SALPINGO OOPHERECTOMY N/A 05/03/2020   Procedure: XI ROBOTIC ASSISTED TOTAL HYSTERECTOMY WITH BILATERAL SALPINGO OOPHORECTOMY, OMENTECTOMY,RADICAL TUMOR DEBULKING, RIGID PROSTOSCOPY;  Surgeon: Everitt Amber, MD;  Location: [facility];  Service: Gynecology;  Laterality: N/A;   THROMBECTOMY FEMORAL ARTERY Right 12/17/2013   Procedure: REPAIR OF RIGHT FEMORAL ARTERY PSEUDOANEURYSM;  Surgeon: Elam Dutch, MD;  Location: [facility] OR;  Service: Vascular;  Laterality: Right;    FAMILY HISTORY: Family History  Problem Relation Age of Onset   Hypertension Mother    Heart failure Mother    Asthma Mother    Ulcers Mother    Benign prostatic hyperplasia Father    Ulcers Father    Esophageal varices Father    Atrial fibrillation Sister    Atrial fibrillation Brother    Heart disease Brother    Melanoma Maternal Aunt    Colon cancer Neg Hx     SOCIAL HISTORY: Social History   Socioeconomic History   Marital status: Married    Spouse name: Not on file   Number of children: 4   Years of education: college   Highest education level: Not on file  Occupational History   Occupation: retired Marine scientist  Tobacco Use   Smoking status: Never   Smokeless tobacco: Never  Vaping Use   Vaping Use: Never used  Substance and Sexual Activity   Alcohol use: No   Drug use: No   Sexual activity: Not Currently  Other Topics Concern   Not on file  Social History Narrative   Lives at home with husband.   Left-handed.   Caffeine use: 1 cup coffee (half caff), half can of Coke   Social Determinants of Health   Financial Resource Strain: Not on file  Food Insecurity: Not on file  Transportation Needs: Not on file  Physical Activity: Not on file  Stress: Not on file  Social Connections: Not on  file  Intimate Partner Violence: Not on file      Marcial Pacas, M.D. Ph.D.  [facility] [address] [place], [place] [address] Ph: [phone] Fax: [phone]  CC:  Manon Hilding, MD [place],  [place] [address]  Manon Hilding, MD

## 2021-08-21 ENCOUNTER — Ambulatory Visit: Payer: Medicare Other | Admitting: Neurology

## 2021-08-21 ENCOUNTER — Telehealth: Payer: Self-pay | Admitting: Neurology

## 2021-08-21 ENCOUNTER — Other Ambulatory Visit: Payer: Self-pay

## 2021-08-21 ENCOUNTER — Ambulatory Visit (INDEPENDENT_AMBULATORY_CARE_PROVIDER_SITE_OTHER): Payer: Medicare Other | Admitting: Neurology

## 2021-08-21 DIAGNOSIS — M545 Low back pain, unspecified: Secondary | ICD-10-CM | POA: Diagnosis not present

## 2021-08-21 DIAGNOSIS — M542 Cervicalgia: Secondary | ICD-10-CM | POA: Insufficient documentation

## 2021-08-21 DIAGNOSIS — M5416 Radiculopathy, lumbar region: Secondary | ICD-10-CM | POA: Insufficient documentation

## 2021-08-21 DIAGNOSIS — M5412 Radiculopathy, cervical region: Secondary | ICD-10-CM | POA: Diagnosis not present

## 2021-08-21 DIAGNOSIS — R202 Paresthesia of skin: Secondary | ICD-10-CM

## 2021-08-21 DIAGNOSIS — G5603 Carpal tunnel syndrome, bilateral upper limbs: Secondary | ICD-10-CM | POA: Diagnosis not present

## 2021-08-21 NOTE — Progress Notes (Signed)
No chief complaint on file.     ASSESSMENT AND PLAN  April Guerra is a 76 y.o. female   Slow worsening bilateral upper and lower extremity paresthesia known history of lumbar degenerative disc disease, severe spinal stenosis at L4-5,  Started since chemotherapy in December 2021, but continued worsening even after stopped taking chemotherapy in March 2022, especially right arm,  EMG nerve conduction study today showed mildly length-dependent axonal sensorimotor neuropathy, also evidence of chronic bilateral lumbosacral radiculopathy, severe bilateral carpal tunnel syndromes, right worse than left, there is also suggestion of mild chronic right cervical radiculopathy.  I have suggested her right wrist splint,  Higher dose of gabapentin up to 300 mg 2 tablets 3 times a day for symptomatic control, especially at nighttime to help her sleep better,  MRI of cervical spine   DIAGNOSTIC DATA (LABS, IMAGING, TESTING) - I reviewed patient records, labs, notes, testing and imaging myself where available.   MEDICAL HISTORY:  April Guerra, is a 76 year old female, seen in request by her primary care physician Dr. Quintin Guerra, April Guerra, for evaluation of bilateral hand and feet paresthesia, initial evaluation was on August 20, 2021,  I reviewed and summarized the referring note. PMHx. HTN History of bleeding Gastric ulcer, Nov 2021. HLD Proximal A fib, on ASA   She had a history of ovarian cancer, had total hysterectomy in November 2021 followed by chemotherapy, Carboplatin, paclitaxel & Aloxi x 6 cycles from 06/06/2020 to 09/19/2020.  After the first round of chemotherapy, she began to noticed numbness from bilateral knee down, also bilateral hands numbness tingling  However since chemotherapy stopped in March 2022, her symptoms continue to progress, especially over the past few months, she complains of intermittent right hand numbness tingling from right shoulder down, especially if she overuses  her right hand, such as cutting bushes, mostly involving right lateral 3 fingers,  She had long history of chronic low back pain, known history of lumbar degenerative disc disease,   I personally reviewed MRI of lumbar spine November 2022: Severe multifactorial spinal and bilateral lateral recess stenosis at L4-5, mild bilateral foraminal narrowing at L5-S1,  She denies significant gait abnormality, but complains of numbness, stinging sensation at the bottom of her feet, slow worsening urinary frequency, urgency, has to wear pads  Update August 21, 2021: She return for electrodiagnostic study today, which showed severe bilateral carpal tunnel syndromes, right worse than left, demyelinating in nature.  There is also evidence of mild right cervical radiculopathy, involving right C5, 6, 7 myotomes, in addition, there is also evidence of chronic bilateral lumbosacral radiculopathy, involving bilateral L4-5 S1 myotomes.  There is superimposed mild axonal sensorimotor neuropathy  Patient stated that her symptoms seems to start at after chemotherapy, but continued to worsen after stopping chemotherapy, would bother her most now is frequent right-sided neck pain, radiating to right shoulder, right upper extremity, frequent awakening at nighttime due to bilateral hands paresthesia, right worse than left, constant lower extremity below the knee paresthesia, with prolonged walking has deep achy pain, worsening urinary urgency, frequency   PHYSICAL EXAM:  PHYSICAL EXAMNIATION:  Gen: NAD, conversant, well nourised, well groomed                     Cardiovascular: Regular rate rhythm, no peripheral edema, warm, nontender. Eyes: Conjunctivae clear without exudates or hemorrhage Neck: Supple, no carotid bruits. Pulmonary: Clear to auscultation bilaterally   NEUROLOGICAL EXAM:  MENTAL STATUS: Speech/cognition: Awake, alert, oriented to history  taking care of conversation   CRANIAL NERVES: CN II: Visual  fields are full to confrontation. Pupils are round equal and briskly reactive to light. CN III, IV, VI: extraocular movement are normal. No ptosis. CN V: Facial sensation is intact to light touch CN VII: Face is symmetric with normal eye closure  CN VIII: Hearing is normal to causal conversation. CN IX, X: Phonation is normal. CN XI: Head turning and shoulder shrug are intact  MOTOR: Mild right shoulder abduction, external rotation weakness, she has mild left more than right ankle dorsiflexion weakness, mild to moderate bilateral toe flexion/toe extension weakness  REFLEXES: Reflexes are 1 and symmetric at the biceps, triceps, absent at knees, and ankles. Plantar responses are flexor.  SENSORY: Mildly length dependent decreased light touch, pinprick  COORDINATION: There is no trunk or limb dysmetria noted.  GAIT/STANCE: She can get up from seated position arm crossed, mildly unsteady, could not stand up on heels, better on tiptoes,  REVIEW OF SYSTEMS:  Full 14 system review of systems performed and notable only for as above All other review of systems were negative.   ALLERGIES: Allergies  Allergen Reactions   Morphine And Related Nausea And Vomiting   Zofran [Ondansetron]     Causes minor constipation, tolerates low doses      HOME MEDICATIONS: Current Outpatient Medications  Medication Sig Dispense Refill   acetaminophen (TYLENOL) 500 MG tablet Take 1,000 mg by mouth 3 (three) times daily.     albuterol (VENTOLIN HFA) 108 (90 Base) MCG/ACT inhaler Inhale 2 puffs into the lungs every 4 (four) hours as needed for wheezing or shortness of breath. 18 g 2   ALPHA LIPOIC ACID PO Take by mouth.     amLODipine (NORVASC) 10 MG tablet Take 1 tablet (10 mg total) by mouth daily with lunch. For BP 30 tablet 3   aspirin EC 81 MG tablet Take 81 mg by mouth daily. Swallow whole.     b complex vitamins capsule Take 1 capsule by mouth at bedtime.     Calcium Carb-Cholecalciferol (CALCIUM  600/VITAMIN D PO) Take 600 mg by mouth in the morning and at bedtime.     cetirizine (ZYRTEC) 10 MG tablet Take 10 mg by mouth daily.     Cholecalciferol (VITAMIN D) 125 MCG (5000 UT) CAPS Take 5,000 Units by mouth daily.     Coenzyme Q10 (COQ10) 100 MG CAPS Take 100 mg by mouth at bedtime.      cyclobenzaprine (FLEXERIL) 10 MG tablet Take 5 mg by mouth See admin instructions. Take 5 mg at night, may take a second 5 mg dose during the day as needed for muscle spasms     diltiazem (CARDIZEM) 30 MG tablet Take 1 tablet every 4 hours AS NEEDED for AFIB heart rate >100 (Patient taking differently: Take 30 mg by mouth every 4 (four) hours as needed (AFIB heart rate > 100).) 30 tablet 3   famotidine (PEPCID) 40 MG tablet Take 40 mg by mouth daily.     fluticasone (FLONASE) 50 MCG/ACT nasal spray Place 1 spray into both nostrils daily.      gabapentin (NEURONTIN) 300 MG capsule Take 2 capsules (600 mg total) by mouth 3 (three) times daily. 540 capsule 3   lidocaine-prilocaine (EMLA) cream Apply to affected area once 30 g 3   lisinopril (ZESTRIL) 30 MG tablet Take 1 tablet (30 mg total) by mouth in the morning. Okay to restart around Monday, 05/14/2020 if blood pressure stable (Patient taking  differently: Take 30 mg by mouth in the morning.) 30 tablet 3   Magnesium 100 MG TABS Take 50 mg by mouth at bedtime.     melatonin 5 MG TABS Take 2.5 mg by mouth at bedtime as needed (Sleep).     metoprolol succinate (TOPROL-XL) 100 MG 24 hr tablet Take 100 mg by mouth in the morning. Take with or immediately following a meal.     Multiple Vitamins-Minerals (CENTRUM SILVER ADULT 50+ PO) Take 1 tablet by mouth daily.     Omega-3 Fatty Acids (FISH OIL) 1000 MG CAPS Take 1,000 mg by mouth daily.     ondansetron (ZOFRAN) 4 MG tablet Take 1 tablet (4 mg total) by mouth every 6 (six) hours as needed for nausea. 20 tablet 0   pantoprazole (PROTONIX) 40 MG tablet Take 1 tablet (40 mg total) by mouth daily. 90 tablet 3    polyethylene glycol powder (GLYCOLAX/MIRALAX) 17 GM/SCOOP powder Take 17 g by mouth daily as needed for mild constipation or moderate constipation (With lunch).     prochlorperazine (COMPAZINE) 10 MG tablet Take 1 tablet (10 mg total) by mouth every 6 (six) hours as needed (Nausea or vomiting). 30 tablet 1   senna (SENOKOT) 8.6 MG tablet Take 1 tablet by mouth daily as needed for constipation.     simvastatin (ZOCOR) 20 MG tablet Take 20 mg by mouth at bedtime.     sodium chloride (OCEAN) 0.65 % SOLN nasal spray Place 1 spray into both nostrils See admin instructions. Use 1 spray in each nostril in the morning may use a second dose at night as needed for congestion     tetrahydrozoline-zinc (VISINE-AC) 0.05-0.25 % ophthalmic solution Place 1 drop into both eyes daily.     triamcinolone (KENALOG) 0.1 % Apply 1 application topically daily as needed (irritation).     Turmeric (QC TUMERIC COMPLEX PO) Take by mouth.     vitamin C (ASCORBIC ACID) 500 MG tablet Take 1,000 mg by mouth 2 (two) times daily.     Vitamins A & D (VITAMIN A & D) ointment Apply 1 application topically daily as needed for dry skin (irritation).     No current facility-administered medications for this visit.    PAST MEDICAL HISTORY: Past Medical History:  Diagnosis Date   A-fib ([place])    Anxiety    Arthritis    Asthma    hx of    Cancer ([place])    ovarian   Dyspnea    with exertion    H/O seasonal allergies    Heart murmur    as aa child    Hypercholesteremia    Hypertension    Spinal stenosis     PAST SURGICAL HISTORY: Past Surgical History:  Procedure Laterality Date   ABDOMINAL HYSTERECTOMY     BIOPSY  05/08/2020   Procedure: BIOPSY;  Surgeon: Harvel Quale, MD;  Location: [facility];  Service: Gastroenterology;;   BREAST SURGERY     CARDIAC CATHETERIZATION     CHOLECYSTECTOMY     COLONOSCOPY N/A 08/14/2015   Procedure: COLONOSCOPY;  Surgeon: Aviva Signs, MD;  Location: [facility];   Service: Gastroenterology;  Laterality: N/A;   ESOPHAGOGASTRODUODENOSCOPY (EGD) WITH PROPOFOL N/A 05/08/2020   Procedure: ESOPHAGOGASTRODUODENOSCOPY (EGD) WITH PROPOFOL;  Surgeon: Harvel Quale, MD;  [facility]: [facility];  Service: Gastroenterology;  Laterality: N/A;   ESOPHAGOGASTRODUODENOSCOPY (EGD) WITH PROPOFOL N/A 08/07/2020   Procedure: ESOPHAGOGASTRODUODENOSCOPY (EGD) WITH PROPOFOL;  Surgeon: Harvel Quale, MD;  Location: [facility];  Service: Gastroenterology;  Laterality: N/A;  7:30   IR IMAGING GUIDED PORT INSERTION  06/04/2020   ROBOTIC ASSISTED TOTAL HYSTERECTOMY WITH BILATERAL SALPINGO OOPHERECTOMY N/A 05/03/2020   Procedure: XI ROBOTIC ASSISTED TOTAL HYSTERECTOMY WITH BILATERAL SALPINGO OOPHORECTOMY, OMENTECTOMY,RADICAL TUMOR DEBULKING, RIGID PROSTOSCOPY;  Surgeon: Everitt Amber, MD;  Location: [facility];  Service: Gynecology;  Laterality: N/A;   THROMBECTOMY FEMORAL ARTERY Right 12/17/2013   Procedure: REPAIR OF RIGHT FEMORAL ARTERY PSEUDOANEURYSM;  Surgeon: Elam Dutch, MD;  Location: [facility] OR;  Service: Vascular;  Laterality: Right;    FAMILY HISTORY: Family History  Problem Relation Age of Onset   Hypertension Mother    Heart failure Mother    Asthma Mother    Ulcers Mother    Benign prostatic hyperplasia Father    Ulcers Father    Esophageal varices Father    Atrial fibrillation Sister    Atrial fibrillation Brother    Heart disease Brother    Melanoma Maternal Aunt    Colon cancer Neg Hx     SOCIAL HISTORY: Social History   Socioeconomic History   Marital status: Married    Spouse name: Not on file   Number of children: 4   Years of education: college   Highest education level: Not on file  Occupational History   Occupation: retired Marine scientist  Tobacco Use   Smoking status: Never   Smokeless tobacco: Never  Vaping Use   Vaping Use: Never used  Substance and Sexual Activity   Alcohol use: No   Drug use: No   Sexual activity: Not  Currently  Other Topics Concern   Not on file  Social History Narrative   Lives at home with husband.   Left-handed.   Caffeine use: 1 cup coffee (half caff), half can of Coke   Social Determinants of Health   Financial Resource Strain: Not on file  Food Insecurity: Not on file  Transportation Needs: Not on file  Physical Activity: Not on file  Stress: Not on file  Social Connections: Not on file  Intimate Partner Violence: Not on file      Marcial Pacas, M.D. Ph.D.  [facility] [address], [place], [place] [address] Ph: [phone] Fax: [phone]  CC:  Manon Hilding, MD [place],  [place] [address]  Manon Hilding, MD

## 2021-08-21 NOTE — Telephone Encounter (Signed)
Pt is asking for a copy of NCV/EMG to be mailed to her when documentation is finished. ?

## 2021-08-21 NOTE — Telephone Encounter (Signed)
[facility] medicare no auth req-order sent to be scheduled at [facility]  ?

## 2021-08-21 NOTE — Procedures (Signed)
? ? ? ?   ?Full Name: April Guerra Gender: Female ?MRN #: [id number] Date of Birth: February 16, 1946 ?   ?Visit Date: 08/21/2021 07:16 ?Age: 76 Years ?Examining Physician: Marcial Pacas, MD  ?Referring Physician: Marcial Pacas, MD ?Height: 4 feet 10 inch ?Patient History: 76 year old female with known history of lumbar stenosis, complains of worsening bilateral lower extremity paresthesia since her chemotherapy in 2021, now with worsening upper extremity paresthesia, frequent awakening at nighttime ? ?Summary of the test ? ?Nerve conduction study: ? ?Right sural, superficial peroneal sensory response showed mildly decreased snap amplitude.  Right median sensory response was absent.  Left median sensory response showed moderately prolonged peak latency with moderately decreased snap amplitude.  Right ulnar sensory response showed normal peak latency, with moderately decreased snap amplitude.  Right radial sensory response was normal. ? ?Right tibial, peroneal to EDB, and right ulnar motor responses were normal. ? ?Bilateral median motor response showed moderately to severely prolonged distal latency, moderately decreased CMAP amplitude, right worse than left. ? ?Electromyography: ? ?Selected needle examination of bilateral lower extremity muscles, bilateral lumbosacral paraspinal muscles; right upper extremity muscles, right cervical paraspinal muscles was performed. ? ?There is evidence of chronic neuropathic changes involving bilateral L4-5 S1 myotomes.  In addition, there is evidence of increased insertional activity at bilateral lower lumbar paraspinal muscles. ? ?There is evidence of mild chronic neuropathic changes involving the right C5-6-7 myotomes.  There is no spontaneous activity at right cervical paraspinal muscles. ?   ?Conclusion: ?This is an abnormal study.  There is electrodiagnostic evidence of mild chronic bilateral L4-5 S1 radiculopathy.  There is also evidence of mild length dependent axonal peripheral  neuropathy; severe bilateral carpal tunnel syndromes, right worse than left, demyelinating in nature.  In addition there is also evidence of mild right cervical radiculopathy, involving right C5-6-7. ? ? ? ?------------------------------- ?Marcial Pacas, M.D. PhD ? ?[facility] ?[address], [address] ?[place], [place] [address] ?Tel: [phone] ?Fax: [phone] ? ?Verbal informed consent was obtained from the patient, patient was informed of potential risk of procedure, including bruising, bleeding, hematoma formation, infection, muscle weakness, muscle pain, numbness, among others. ?   ? ?   ?[place] ?   ?Nerve / Sites Muscle Latency Ref. Amplitude Ref. Rel Amp Segments Distance Velocity Ref. Area  ?  ms ms mV mV %  cm m/s m/s mVms  ?R Median - APB  ?   Wrist APB 6.9 ?4.4 2.0 ?4.0 100 Wrist - APB 7   7.8  ?   Upper arm APB 10.1  2.9  144 Upper arm - Wrist 16 49 ?49 11.7  ?   Ulnar Wrist APB 3.7  2.2  76.7 Ulnar Wrist - APB    4.0  ?L Median - APB  ?   Wrist APB 5.5 ?4.4 3.0 ?4.0 100 Wrist - APB 7   11.3  ?   Upper arm APB 8.7  2.9  97 Upper arm - Wrist 16 50 ?49 10.0  ?R Ulnar - ADM  ?   Wrist ADM 2.7 ?3.3 6.3 ?6.0 100 Wrist - ADM 7   17.4  ?   B.Elbow ADM 5.1  6.3  100 B.Elbow - Wrist 13 53 ?49 15.8  ?   A.Elbow ADM 7.1  5.9  94.9 A.Elbow - B.Elbow 10 50 ?49 16.2  ?R Peroneal - EDB  ?   Ankle EDB 3.2 ?6.5 3.0 ?2.0 100 Ankle - EDB 9   5.8  ?   Fib  head EDB 8.1  2.2  75 Fib head - Ankle 22 45 ?44 5.2  ?   Pop fossa EDB 10.4  2.1  94.8 Pop fossa - Fib head 10 44 ?44 5.1  ?       Pop fossa - Ankle      ?R Tibial - AH  ?   Ankle AH 3.1 ?5.8 10.1 ?4.0 100 Ankle - AH 9   19.0  ?   Pop fossa AH 10.6  5.6  55.3 Pop fossa - Ankle 34 46 ?41 12.4  ?             ?[place] ?   ?Nerve / Sites Rec. Site Peak Lat Ref.  Amp Ref. Segments Distance  ?  ms ms ?V ?V  cm  ?R Radial - Anatomical snuff box (Forearm)  ?   Forearm Wrist 2.5 ?2.9 27 ?15 Forearm - Wrist 10  ?R Sural - Ankle (Calf)  ?   Calf Ankle 2.8 ?4.4 4 ?6 Calf -  Ankle 10  ?R Superficial peroneal - Ankle  ?   Lat leg Ankle 2.8 ?4.4 4 ?6 Lat leg - Ankle 10  ?R Median - Orthodromic (Dig II, Mid palm)  ?   Dig II Wrist NR ?3.4 NR ?10 Dig II - Wrist 13  ?L Median - Orthodromic (Dig II, Mid palm)  ?   Dig II Wrist 4.6 ?3.4 5 ?10 Dig II - Wrist 13  ?R Ulnar - Orthodromic, (Dig V, Mid palm)  ?   Dig V Wrist 2.9 ?3.1 2 ?5 Dig V - Wrist 11  ?               ?F  Wave ?   ?Nerve F Lat Ref.  ? ms ms  ?R Tibial - AH 50.1 ?56.0  ?R Ulnar - ADM 25.5 ?32.0  ?       ?EMG Summary Table   ? Spontaneous MUAP Recruitment  ?Muscle IA Fib PSW Fasc Other Amp Dur. Poly Pattern  ?R. Tibialis anterior Normal None None None _______ Normal Normal Normal Reduced  ?R. Tibialis posterior Normal None None None _______ Normal Normal Normal Reduced  ?R. Peroneus longus Normal None None None _______ Normal Normal Normal Reduced  ?R. Gastrocnemius (Medial head) Normal None None None _______ Normal Normal Normal Reduced  ?R. Vastus lateralis Normal None None None _______ Normal Normal Normal Reduced  ?L. Tibialis anterior Normal None None None _______ Normal Normal Normal Reduced  ?L. Tibialis posterior Normal None None None _______ Normal Normal Normal Reduced  ?L. Peroneus longus Normal None None None _______ Normal Normal Normal Reduced  ?L. Gastrocnemius (Medial head) Normal None None None _______ Normal Normal Normal Reduced  ?L. Lumbar paraspinals Increased 1+ 1+ None _______ Normal Normal Normal Normal  ?R. Lumbar paraspinals (low) Increased 1+ 1+ None _______ Normal Normal Normal Normal  ?R. Lumbar paraspinals (mid) Increased 1+ 1+ None _______ Normal Normal Normal Normal  ?L. Lumbar paraspinals (mid) Increased 1+ None None _______ Normal Normal Normal Normal  ?R. First dorsal interosseous Normal None None None _______ Normal Normal Normal Reduced  ?R. Abductor pollicis brevis Normal None None None _______ Increased Increased 1+ Reduced  ?R. Pronator teres Normal None None None _______ Normal Normal  Normal Reduced  ?R. Biceps brachii Normal None None None _______ Normal Normal Normal Reduced  ?R. Deltoid Normal None None None _______ Normal Normal Normal Reduced  ?R. Extensor digitorum communis Normal None None None _______ Normal Normal  Normal Reduced  ?R. Brachioradialis Normal None None None _______ Normal Normal Normal Reduced  ?R. ?Cervical Paraspinals Normal None None None _______ Normal Normal Normal Normal  ? ?  ?

## 2021-08-23 ENCOUNTER — Telehealth: Payer: Self-pay | Admitting: *Deleted

## 2021-08-23 NOTE — Telephone Encounter (Signed)
Spoke with the patient and she stated "I want to keep the appt. The oncologist saw something on the scan the concerned him and wanted Dr Berline Lopes to see me earlier than May. The scan didn't show cancer." Explained to the patient that our office would see her Monday at her appt  ?

## 2021-08-23 NOTE — Telephone Encounter (Signed)
Per Dr Berline Lopes called and left the patient a message to call the office back. Need to move the patient 's appt to May.  ?

## 2021-08-23 NOTE — Progress Notes (Signed)
Gynecologic Oncology Return Clinic Visit  07/29/21  Reason for Visit: surveillance in the setting of a history of ovarian cancer  Treatment History: Oncology History  Right ovarian epithelial cancer ([place])  05/03/2020 Initial Diagnosis   Right ovarian epithelial cancer ([place])   05/03/2020 Cancer Staging   Staging form: Ovary, Fallopian Tube, and Primary Peritoneal Carcinoma, AJCC 8th Edition - Clinical stage from 05/03/2020: FIGO Stage II, calculated as Stage Unknown (cT2b, cNX, cM0) - Signed by Everitt Amber, MD on 05/24/2020    06/06/2020 -  Chemotherapy    Patient is on Treatment Plan: OVARIAN CARBOPLATIN (AUC 6) / PACLITAXEL (175) Q21D X 6 CYCLES        Genetic Testing   Negative genetic testing. No pathogenic variants identified on the [employer]. The report date is 08/10/2020.  The CancerNext gene panel offered by [employer] includes sequencing and rearrangement analysis for the following 36 genes: APC*, ATM*, AXIN2, BARD1, BMPR1A, BRCA1*, BRCA2*, BRIP1*, CDH1*, CDK4, CDKN2A, CHEK2*, DICER1, MLH1*, MSH2*, MSH3, MSH6*, MUTYH*, NBN, NF1*, NTHL1, PALB2*, PMS2*, PTEN*, RAD51C*, RAD51D*, RECQL, SMAD4, SMARCA4, STK11 and TP53* (sequencing and deletion/duplication); HOXB13, POLD1 and POLE (sequencing only); EPCAM and GREM1 (deletion/duplication only).   Somatic genes analyzed through TumorNext-HRD: ATM, BARD1, BRCA1, BRCA2, BRIP1, CHEK2, MRE11A, NBN, PALB2, RAD51C, RAD51D.    The patient reported approximately 71-monthhistory of progressive urinary frequency.  This was initially worked up by testing and treating empirically for urinary tract infections.  When that failed to resolve her symptoms she was referred to a urologist, Dr. WJeffie Pollock who performed a pelvic examination which palpated a mass in the pelvis.  Due to concern that her urinary frequency symptoms were secondary to extrinsic compression a CT scan of the abdomen and pelvis was ordered.  Of note her  urinary analysis was fairly unremarkable.   [facility] showed a 14.5 x 14 x 12 cm solid and cystic right ovarian mass worrisome for ovarian carcinoma.  There were no findings for intraperitoneal metastatic disease.  There is no lymphadenopathy present.   A transvaginal ultrasound on 04/12/2020 confirmed this finding of an ovarian mass.  The uterus itself measured 7.2 x 3.2 x 4.6 cm with a 6 mm endometrium.  The right ovary was not visualized.  The left ovary was not visualized.  There was a large complex cystic mass filling the pelvis measuring 14.5 x 10.9 x 13.4 cm.  The majority of this was a complicated cystic locule.  However inferiorly was complex solid and cystic intracystic mass identified estimated to be 5.7 x 6.5 x 5 cm with multiple septations.  There was blood flow within the mural nodule.   CA 125 tumor marker was elevated at 114 on 04/20/20.  On 05/03/2020 she underwent a robotic assisted total hysterectomy with bilateral salpingo-oophorectomy, omentectomy, radical tumor debulking, and omentectomy.  Intraoperative findings were significant for a grossly normal-appearing 7 cm uterus and grossly normal-appearing left tube and ovary.  The right ovary was replaced by 15 cm cystic and solid mass which was solid at its inferior aspect and densely adherent and infiltrating into the right uterosacral ligament and right parametrial tissues.  The rectum was adherent to the posterior uterus and cervix in the midline.  There was no gross upper abdominal disease.  There was trace ascites that was amber in color.  There was unavoidable cyst ruptured during the procedure due to its infiltrative nature.  Diaphragms were smooth.  No gross abnormalities was  seen in the intestines.  There was no gross residual tumor at the completion of the procedure representing a complete, R0 resection.   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  The carcinoma was confined to the ovary without involvement of the ovarian surface.  The left ovary tube and uterus were all benign.  The omentum was benign as were the peritoneal biopsies for staging.  Peritoneal washings were negative.  Due to the infiltrative nature of the tumor into the pelvic sidewall she was determined to have a stage II cancer, grade 3 clear cell.  Due to the locally infiltrative nature of the tumor and its high-grade she was recommended to have adjuvant chemotherapy for 6 cycles of carboplatin and paclitaxel in accordance with NCCN guidelines.  Germline and somatic testing for HRD and BRCA was negative.    She went on to receive 6 cycles of carboplatin and paclitaxel chemotherapy with Dr Delton Coombes at [facility] between 06/06/20 and 09/19/20. She tolerated therapy well with toxicity of neuropathy persistent.   CA 125 postoperatively and pre-treatment was 46.5 on 08/08/20.   After completion of therapy it was 41 on 09/19/20.   Post-treatment CT abd/pelvis 10/16/2020 revealed no evidence of recurrent or metastatic disease.  There is minimal increased density at the vaginal apex that was felt to likely reflect postoperative changes with no measurable soft tissue in this location.  They recommended attention on follow-up.   CA 125 on 10/16/20 was 40.9. CA 125 on 01/22/21 was 38.8. CA 125 on 04/30/21 was normal at 30.1. CA 125 on 08/05/21 was normal at 32.3.  CT of the abdomen and pelvis on 08/06/2021: No findings of active malignancy.  Aortic atherosclerosis as well as systemic atherosclerosis noted.  Mild cardiomegaly.  Hiatal hernia.  Prominent stool throughout the colon favors constipation.  Sigmoid colon diverticulosis.  Interval History: Patient presents today for follow-up.  She recently saw her medical oncologist in late February.  She notes overall doing well.  She denies any abdominal or pelvic pain.  Denies any vaginal bleeding or discharge.  Endorses a good  appetite without nausea or emesis.  Endorses normal bowel function.  Continues to have some urinary incontinence that she describes as urge urinary incontinence.  She is scheduled to see Dr. Roni Bread in 2 weeks.  She has not previously been on any medications for urinary incontinence.  Past Medical/Surgical History: Past Medical History:  Diagnosis Date   A-fib ([place])    Anxiety    Arthritis    Asthma    hx of    Cancer ([place])    ovarian   Dyspnea    with exertion    H/O seasonal allergies    Heart murmur    as aa child    Hypercholesteremia    Hypertension    Spinal stenosis     Past Surgical History:  Procedure Laterality Date   ABDOMINAL HYSTERECTOMY     BIOPSY  05/08/2020   Procedure: BIOPSY;  Surgeon: Harvel Quale, MD;  [facility]: [facility];  Service: Gastroenterology;;   BREAST SURGERY     CARDIAC CATHETERIZATION     CHOLECYSTECTOMY     COLONOSCOPY N/A 08/14/2015   Procedure: COLONOSCOPY;  Surgeon: Aviva Signs, MD;  [facility]: [facility];  Service: Gastroenterology;  Laterality: N/A;   ESOPHAGOGASTRODUODENOSCOPY (EGD) WITH PROPOFOL N/A 05/08/2020   Procedure: ESOPHAGOGASTRODUODENOSCOPY (EGD) WITH PROPOFOL;  Surgeon: Harvel Quale, MD;  [facility];  Service: Gastroenterology;  Laterality: N/A;   ESOPHAGOGASTRODUODENOSCOPY (EGD) WITH PROPOFOL N/A 08/07/2020   Procedure: ESOPHAGOGASTRODUODENOSCOPY (EGD) WITH PROPOFOL;  Surgeon: Harvel Quale, MD;  Location: [facility];  Service: Gastroenterology;  Laterality: N/A;  7:30   IR IMAGING GUIDED PORT INSERTION  06/04/2020   ROBOTIC ASSISTED TOTAL HYSTERECTOMY WITH BILATERAL SALPINGO OOPHERECTOMY N/A 05/03/2020   Procedure: XI ROBOTIC ASSISTED TOTAL HYSTERECTOMY WITH BILATERAL SALPINGO OOPHORECTOMY, OMENTECTOMY,RADICAL TUMOR DEBULKING, RIGID PROSTOSCOPY;  Surgeon: Everitt Amber, MD;  Location: [facility];  Service: Gynecology;  Laterality: N/A;   THROMBECTOMY FEMORAL ARTERY Right  12/17/2013   Procedure: REPAIR OF RIGHT FEMORAL ARTERY PSEUDOANEURYSM;  Surgeon: Elam Dutch, MD;  Location: [facility] OR;  Service: Vascular;  Laterality: Right;    Family History  Problem Relation Age of Onset   Hypertension Mother    Heart failure Mother    Asthma Mother    Ulcers Mother    Benign prostatic hyperplasia Father    Ulcers Father    Esophageal varices Father    Atrial fibrillation Sister    Atrial fibrillation Brother    Heart disease Brother    Melanoma Maternal Aunt    Colon cancer Neg Hx     Social History   Socioeconomic History   Marital status: Married    Spouse name: Not on file   Number of children: 4   Years of education: college   Highest education level: Not on file  Occupational History   Occupation: retired Marine scientist  Tobacco Use   Smoking status: Never   Smokeless tobacco: Never  Vaping Use   Vaping Use: Never used  Substance and Sexual Activity   Alcohol use: No   Drug use: No   Sexual activity: Not Currently  Other Topics Concern   Not on file  Social History Narrative   Lives at home with husband.   Left-handed.   Caffeine use: 1 cup coffee (half caff), half can of Coke   Social Determinants of Health   Financial Resource Strain: Not on file  Food Insecurity: Not on file  Transportation Needs: Not on file  Physical Activity: Not on file  Stress: Not on file  Social Connections: Not on file    Current Medications:  Current Outpatient Medications:    acetaminophen (TYLENOL) 500 MG tablet, Take 1,000 mg by mouth 3 (three) times daily., Disp: , Rfl:    albuterol (VENTOLIN HFA) 108 (90 Base) MCG/ACT inhaler, Inhale 2 puffs into the lungs every 4 (four) hours as needed for wheezing or shortness of breath., Disp: 18 g, Rfl: 2   ALPHA LIPOIC ACID PO, Take by mouth., Disp: , Rfl:    amLODipine (NORVASC) 10 MG tablet, Take 1 tablet (10 mg total) by mouth daily with lunch. For BP, Disp: 30 tablet, Rfl: 3   aspirin EC 81 MG tablet, Take 81  mg by mouth daily. Swallow whole., Disp: , Rfl:    b complex vitamins capsule, Take 1 capsule by mouth at bedtime., Disp: , Rfl:    Calcium Carb-Cholecalciferol (CALCIUM 600/VITAMIN D PO), Take 600 mg by mouth in the morning and at bedtime., Disp: , Rfl:    cetirizine (ZYRTEC) 10 MG tablet, Take 10 mg by mouth daily., Disp: , Rfl:    Cholecalciferol (VITAMIN D) 125 MCG (5000 UT) CAPS, Take 5,000 Units by mouth daily., Disp: , Rfl:    Coenzyme Q10 (COQ10) 100 MG CAPS, Take 100 mg by mouth at bedtime. , Disp: , Rfl:    cyclobenzaprine (FLEXERIL)  10 MG tablet, Take 5 mg by mouth See admin instructions. Take 5 mg at night, may take a second 5 mg dose during the day as needed for muscle spasms, Disp: , Rfl:    diltiazem (CARDIZEM) 30 MG tablet, Take 1 tablet every 4 hours AS NEEDED for AFIB heart rate >100 (Patient taking differently: Take 30 mg by mouth every 4 (four) hours as needed (AFIB heart rate > 100).), Disp: 30 tablet, Rfl: 3   famotidine (PEPCID) 40 MG tablet, Take 40 mg by mouth daily., Disp: , Rfl:    fluticasone (FLONASE) 50 MCG/ACT nasal spray, Place 1 spray into both nostrils daily. , Disp: , Rfl:    gabapentin (NEURONTIN) 300 MG capsule, Take 2 capsules (600 mg total) by mouth 3 (three) times daily., Disp: 540 capsule, Rfl: 3   lidocaine-prilocaine (EMLA) cream, Apply to affected area once, Disp: 30 g, Rfl: 3   lisinopril (ZESTRIL) 30 MG tablet, Take 1 tablet (30 mg total) by mouth in the morning. Okay to restart around Monday, 05/14/2020 if blood pressure stable (Patient taking differently: Take 30 mg by mouth in the morning.), Disp: 30 tablet, Rfl: 3   Magnesium 100 MG TABS, Take 50 mg by mouth at bedtime., Disp: , Rfl:    melatonin 5 MG TABS, Take 2.5 mg by mouth at bedtime as needed (Sleep)., Disp: , Rfl:    metoprolol succinate (TOPROL-XL) 100 MG 24 hr tablet, Take 100 mg by mouth in the morning. Take with or immediately following a meal., Disp: , Rfl:    Multiple Vitamins-Minerals  (CENTRUM SILVER ADULT 50+ PO), Take 1 tablet by mouth daily., Disp: , Rfl:    Omega-3 Fatty Acids (FISH OIL) 1000 MG CAPS, Take 1,000 mg by mouth daily., Disp: , Rfl:    ondansetron (ZOFRAN) 4 MG tablet, Take 1 tablet (4 mg total) by mouth every 6 (six) hours as needed for nausea., Disp: 20 tablet, Rfl: 0   polyethylene glycol powder (GLYCOLAX/MIRALAX) 17 GM/SCOOP powder, Take 17 g by mouth daily as needed for mild constipation or moderate constipation (With lunch)., Disp: , Rfl:    prochlorperazine (COMPAZINE) 10 MG tablet, Take 1 tablet (10 mg total) by mouth every 6 (six) hours as needed (Nausea or vomiting)., Disp: 30 tablet, Rfl: 1   senna (SENOKOT) 8.6 MG tablet, Take 1 tablet by mouth daily as needed for constipation., Disp: , Rfl:    simvastatin (ZOCOR) 20 MG tablet, Take 20 mg by mouth at bedtime., Disp: , Rfl:    sodium chloride (OCEAN) 0.65 % SOLN nasal spray, Place 1 spray into both nostrils See admin instructions. Use 1 spray in each nostril in the morning may use a second dose at night as needed for congestion, Disp: , Rfl:    tetrahydrozoline-zinc (VISINE-AC) 0.05-0.25 % ophthalmic solution, Place 1 drop into both eyes daily., Disp: , Rfl:    triamcinolone (KENALOG) 0.1 %, Apply 1 application topically daily as needed (irritation)., Disp: , Rfl:    Turmeric (QC TUMERIC COMPLEX PO), Take by mouth., Disp: , Rfl:    vitamin C (ASCORBIC ACID) 500 MG tablet, Take 1,000 mg by mouth 2 (two) times daily., Disp: , Rfl:    Vitamins A & D (VITAMIN A & D) ointment, Apply 1 application topically daily as needed for dry skin (irritation)., Disp: , Rfl:    pantoprazole (PROTONIX) 40 MG tablet, Take 1 tablet (40 mg total) by mouth daily., Disp: 90 tablet, Rfl: 3  Review of Systems: Denies appetite changes,  fevers, chills, fatigue, unexplained weight changes. Denies hearing loss, neck lumps or masses, mouth sores, ringing in ears or voice changes. Denies cough or wheezing.  Denies shortness of  breath. Denies chest pain or palpitations. Denies leg swelling. Denies abdominal distention, pain, blood in stools, constipation, diarrhea, nausea, vomiting, or early satiety. Denies pain with intercourse, dysuria, frequency, hematuria. Denies hot flashes, pelvic pain, vaginal bleeding or vaginal discharge.   Denies joint pain, back pain or muscle pain/cramps. Denies itching, rash, or wounds. Denies dizziness, headaches, numbness or seizures. Denies swollen lymph nodes or glands, denies easy bruising or bleeding. Denies anxiety, depression, confusion, or decreased concentration.  Physical Exam: BP (!) 178/77 (BP Location: Left Arm, Patient Position: Sitting)    Pulse 73    Temp 98.9 F (37.2 C) (Oral)    Resp 18    Ht 4' 9.87" (1.47 m)    Wt 179 lb 9.6 oz (81.5 kg)    SpO2 98%    BMI 37.70 kg/m  General: Alert, oriented, no acute distress. HEENT: Normocephalic, atraumatic, sclera anicteric. Chest: Unlabored breathing on room air. Cardiovascular: Regular rate and rhythm, no murmurs. Abdomen: Obese, soft, nontender.  Normoactive bowel sounds.  No masses or hepatosplenomegaly appreciated.  Well-healed incisions. Extremities: Grossly normal range of motion.  Warm, well perfused.  No edema bilaterally. Skin: No rashes or lesions noted. Lymphatics: No cervical, supraclavicular, or inguinal adenopathy. GU: Normal appearing external genitalia without erythema, excoriation, or lesions.  Speculum exam reveals mildly atrophic vaginal mucosa, no lesions appreciated.  Bimanual exam reveals no nodularity or masses.  Rectovaginal exam confirms these findings.  Laboratory & Radiologic Studies: Component Ref Range & Units 3 wk ago (08/05/21) 3 mo ago (04/30/21) 7 mo ago (01/22/21) 10 mo ago (10/16/20) 11 mo ago (09/19/20) 1 yr ago (08/08/20) 1 yr ago (04/20/20)  Cancer Antigen (CA) 125 0.0 - 38.1 U/mL 32.3  30.1 CM  38.8 High  CM  40.9 High  CM  41.0 High  CM  46.5 High  CM  114.0 High  CM      Assessment & Plan: April Guerra is a 76 y.o. woman with a history of stage II clear cell ovarian cancer s/p robotic assisted total hysterectomy, BSO, omentectomy, staging on 05/03/20. BRCA/HRD negative. S/p adjuvant carb/tax x 6 completed 09/19/20. Complete clinical response (upper limit normal but stable CA125, negative scans, negative exam).  The patient is overall doing very well and is NED on exam today.  Recent CT scan was negative for any evidence of disease.  Additionally, her CA-125 has now normalized and dropped below the upper limit of normal.  Per NCCN surveillance recommendations, I recommend continuing surveillance visits every 3 months with an exam and CA-125 until at least 2 years out from completion of adjuvant treatment.  She is not scheduled to see her medical oncologist until this fall.  I will see her back for follow-up in 3 months.  We will then plan to alternate between Dr. Delton Coombes and myself every 6 months.  We reviewed signs and symptoms that would be concerning for cancer recurrence, and I have urged her to call if she develops any of these between visits.   Patient is scheduled to see urology in the next couple of weeks.  I urged her to discuss her urinary incontinence symptoms and possible treatment options.  32 minutes of total time was spent for this patient encounter, including preparation, face-to-face counseling with the patient and coordination of care, and documentation of the encounter.  Jeral Pinch, MD  Division of Gynecologic Oncology  Department of Obstetrics and Gynecology  [facility]

## 2021-08-26 ENCOUNTER — Encounter: Payer: Self-pay | Admitting: Gynecologic Oncology

## 2021-08-26 ENCOUNTER — Ambulatory Visit: Payer: Medicare Other | Admitting: Gynecologic Oncology

## 2021-08-26 ENCOUNTER — Inpatient Hospital Stay: Payer: Medicare Other | Attending: Gynecologic Oncology | Admitting: Gynecologic Oncology

## 2021-08-26 ENCOUNTER — Other Ambulatory Visit: Payer: Self-pay

## 2021-08-26 VITALS — BP 178/77 | HR 73 | Temp 98.9°F | Resp 18 | Ht <= 58 in | Wt 179.6 lb

## 2021-08-26 DIAGNOSIS — Z90710 Acquired absence of both cervix and uterus: Secondary | ICD-10-CM | POA: Diagnosis not present

## 2021-08-26 DIAGNOSIS — Z8543 Personal history of malignant neoplasm of ovary: Secondary | ICD-10-CM

## 2021-08-26 DIAGNOSIS — Z9079 Acquired absence of other genital organ(s): Secondary | ICD-10-CM | POA: Diagnosis not present

## 2021-08-26 DIAGNOSIS — Z9221 Personal history of antineoplastic chemotherapy: Secondary | ICD-10-CM | POA: Insufficient documentation

## 2021-08-26 DIAGNOSIS — C561 Malignant neoplasm of right ovary: Secondary | ICD-10-CM

## 2021-08-26 DIAGNOSIS — N3941 Urge incontinence: Secondary | ICD-10-CM

## 2021-08-26 DIAGNOSIS — Z90722 Acquired absence of ovaries, bilateral: Secondary | ICD-10-CM | POA: Insufficient documentation

## 2021-08-26 NOTE — Patient Instructions (Signed)
It was nice to meet you today.  I do not see or feel any evidence of cancer on your exam.  I will plan to see you back in 3 months.  If you develop any of the symptoms that we discussed today before your next visit, please call to see me sooner. ?

## 2021-09-02 NOTE — Telephone Encounter (Signed)
I spoke with patient who asked for a copy of her medical records and copy of test results. She would like to have them mailed to her [address]: ? ?[address] ?[place] [address] ? ?Thanks! ?

## 2021-09-06 ENCOUNTER — Other Ambulatory Visit: Payer: Self-pay

## 2021-09-06 ENCOUNTER — Ambulatory Visit (HOSPITAL_COMMUNITY)
Admission: RE | Admit: 2021-09-06 | Discharge: 2021-09-06 | Disposition: A | Discharge status: Home / Self Care | Payer: Medicare Other | Source: Ambulatory Visit | Attending: Neurology | Admitting: Neurology

## 2021-09-06 DIAGNOSIS — M542 Cervicalgia: Secondary | ICD-10-CM | POA: Insufficient documentation

## 2021-09-06 DIAGNOSIS — M545 Low back pain, unspecified: Secondary | ICD-10-CM | POA: Diagnosis present

## 2021-09-06 DIAGNOSIS — R202 Paresthesia of skin: Secondary | ICD-10-CM | POA: Diagnosis present

## 2021-09-10 ENCOUNTER — Telehealth: Payer: Self-pay | Admitting: Neurology

## 2021-09-10 NOTE — Telephone Encounter (Signed)
I called patient to discuss. No answer, left a message asking her to call us back. If patient calls another day please route to POD 2. ? ?

## 2021-09-10 NOTE — Telephone Encounter (Signed)
Please call patient, MRI of cervical spine showed multilevel degenerative changes, no evidence of spinal cord or nerve root compression, ? ?Above findings would not explain her worsening leg paresthesia ? ?Continue current medication management ? ? ?IMPRESSION: ?1. Generalized cervical spine degeneration most heavily affecting ?the facets with left C2-3 facet ankylosis and mild anterolisthesis ?at C4-5 and C7-T1. ?2. Degenerative foraminal impingement on the right at C3-4 and ?bilaterally at C5-6 and T1-2. On the symptomatic right side ?foraminal impingement is greatest at C5-6. ?3. Diffusely patent spinal canal. ?4. No evidence of bone lesion. ?

## 2021-09-10 NOTE — Telephone Encounter (Signed)
Attempted to reach patient before leaving today. Left second message requesting a call back. ?

## 2021-09-11 NOTE — Telephone Encounter (Signed)
Pt returned call. Please call back when available. Pt states she has no other doctor appts today so she will be by the phone. ?

## 2021-09-11 NOTE — Telephone Encounter (Signed)
I spoke to the patient to notify her of the MRI cervical findings. She verbalized understanding. She will continue to taking gabapentin. Plans to have her PCP take over future refills. ?

## 2021-11-05 ENCOUNTER — Encounter (HOSPITAL_COMMUNITY): Payer: Medicare Other

## 2021-11-13 ENCOUNTER — Inpatient Hospital Stay (HOSPITAL_COMMUNITY): Payer: Medicare Other | Attending: Hematology

## 2021-11-13 DIAGNOSIS — Z452 Encounter for adjustment and management of vascular access device: Secondary | ICD-10-CM | POA: Diagnosis present

## 2021-11-13 DIAGNOSIS — C561 Malignant neoplasm of right ovary: Secondary | ICD-10-CM | POA: Diagnosis present

## 2021-11-13 MED ORDER — HEPARIN SOD (PORK) LOCK FLUSH 100 UNIT/ML IV SOLN
500.0000 [IU] | Freq: Once | INTRAVENOUS | Status: AC
  Administered 2021-11-13: 500 [IU] via INTRAVENOUS

## 2021-11-13 MED ORDER — SODIUM CHLORIDE 0.9% FLUSH
10.0000 mL | INTRAVENOUS | Status: DC | PRN
  Administered 2021-11-13: 10 mL via INTRAVENOUS

## 2021-11-13 NOTE — Progress Notes (Signed)
April Guerra presented for [employer] and flush.  Portacath located right chest wall accessed with  H 20 needle.  Good blood return present. Portacath flushed with 65m NS and 500U/585mHeparin and needle removed intact. No bruising or swelling noted at the site.  Procedure tolerated well and without incident.     Discharged from clinic ambulatory in stable condition. Alert and oriented x 3. [facility]

## 2021-11-22 DIAGNOSIS — M62838 Other muscle spasm: Secondary | ICD-10-CM | POA: Diagnosis not present

## 2021-11-22 DIAGNOSIS — M6281 Muscle weakness (generalized): Secondary | ICD-10-CM | POA: Diagnosis not present

## 2021-11-22 DIAGNOSIS — M6289 Other specified disorders of muscle: Secondary | ICD-10-CM | POA: Diagnosis not present

## 2021-11-22 DIAGNOSIS — R350 Frequency of micturition: Secondary | ICD-10-CM | POA: Diagnosis not present

## 2021-11-26 ENCOUNTER — Encounter: Payer: Self-pay | Admitting: Gynecologic Oncology

## 2021-11-26 DIAGNOSIS — M9901 Segmental and somatic dysfunction of cervical region: Secondary | ICD-10-CM | POA: Diagnosis not present

## 2021-11-26 DIAGNOSIS — M9902 Segmental and somatic dysfunction of thoracic region: Secondary | ICD-10-CM | POA: Diagnosis not present

## 2021-11-26 DIAGNOSIS — M7711 Lateral epicondylitis, right elbow: Secondary | ICD-10-CM | POA: Diagnosis not present

## 2021-11-26 DIAGNOSIS — M47812 Spondylosis without myelopathy or radiculopathy, cervical region: Secondary | ICD-10-CM | POA: Diagnosis not present

## 2021-11-26 DIAGNOSIS — M531 Cervicobrachial syndrome: Secondary | ICD-10-CM | POA: Diagnosis not present

## 2021-11-27 ENCOUNTER — Inpatient Hospital Stay: Payer: Medicare Other | Attending: Gynecologic Oncology

## 2021-11-27 DIAGNOSIS — Z8543 Personal history of malignant neoplasm of ovary: Secondary | ICD-10-CM | POA: Insufficient documentation

## 2021-11-27 DIAGNOSIS — Z90722 Acquired absence of ovaries, bilateral: Secondary | ICD-10-CM | POA: Insufficient documentation

## 2021-11-27 DIAGNOSIS — Z90710 Acquired absence of both cervix and uterus: Secondary | ICD-10-CM | POA: Insufficient documentation

## 2021-11-27 DIAGNOSIS — Z9221 Personal history of antineoplastic chemotherapy: Secondary | ICD-10-CM | POA: Insufficient documentation

## 2021-11-28 ENCOUNTER — Other Ambulatory Visit: Payer: Self-pay

## 2021-11-28 ENCOUNTER — Other Ambulatory Visit: Payer: Self-pay | Admitting: Lab

## 2021-11-28 ENCOUNTER — Encounter: Payer: Self-pay | Admitting: Gynecologic Oncology

## 2021-11-28 ENCOUNTER — Inpatient Hospital Stay: Payer: Medicare Other

## 2021-11-28 ENCOUNTER — Inpatient Hospital Stay (HOSPITAL_BASED_OUTPATIENT_CLINIC_OR_DEPARTMENT_OTHER): Payer: Medicare Other | Admitting: Gynecologic Oncology

## 2021-11-28 VITALS — BP 170/62 | HR 65 | Temp 98.8°F | Resp 16 | Ht <= 58 in | Wt 174.5 lb

## 2021-11-28 DIAGNOSIS — Z8543 Personal history of malignant neoplasm of ovary: Secondary | ICD-10-CM | POA: Diagnosis present

## 2021-11-28 DIAGNOSIS — Z90722 Acquired absence of ovaries, bilateral: Secondary | ICD-10-CM | POA: Diagnosis not present

## 2021-11-28 DIAGNOSIS — Z90710 Acquired absence of both cervix and uterus: Secondary | ICD-10-CM | POA: Diagnosis not present

## 2021-11-28 DIAGNOSIS — C561 Malignant neoplasm of right ovary: Secondary | ICD-10-CM

## 2021-11-28 DIAGNOSIS — Z9221 Personal history of antineoplastic chemotherapy: Secondary | ICD-10-CM | POA: Diagnosis not present

## 2021-11-28 NOTE — Progress Notes (Signed)
Gynecologic Oncology Return Clinic Visit  11/28/21  Reason for Visit: surveillance in the setting of a history of ovarian cancer  Treatment History: Oncology History  Right ovarian epithelial cancer ([place])  05/03/2020 Initial Diagnosis   Right ovarian epithelial cancer ([place])   05/03/2020 Cancer Staging   Staging form: Ovary, Fallopian Tube, and Primary Peritoneal Carcinoma, AJCC 8th Edition - Clinical stage from 05/03/2020: FIGO Stage II, calculated as Stage Unknown (cT2b, cNX, cM0) - Signed by Everitt Amber, MD on 05/24/2020   06/06/2020 -  Chemotherapy    Patient is on Treatment Plan: OVARIAN CARBOPLATIN (AUC 6) / PACLITAXEL (175) Q21D X 6 CYCLES       Genetic Testing   Negative genetic testing. No pathogenic variants identified on the [employer]. The report date is 08/10/2020.  The CancerNext gene panel offered by [employer] includes sequencing and rearrangement analysis for the following 36 genes: APC*, ATM*, AXIN2, BARD1, BMPR1A, BRCA1*, BRCA2*, BRIP1*, CDH1*, CDK4, CDKN2A, CHEK2*, DICER1, MLH1*, MSH2*, MSH3, MSH6*, MUTYH*, NBN, NF1*, NTHL1, PALB2*, PMS2*, PTEN*, RAD51C*, RAD51D*, RECQL, SMAD4, SMARCA4, STK11 and TP53* (sequencing and deletion/duplication); HOXB13, POLD1 and POLE (sequencing only); EPCAM and GREM1 (deletion/duplication only).   Somatic genes analyzed through TumorNext-HRD: ATM, BARD1, BRCA1, BRCA2, BRIP1, CHEK2, MRE11A, NBN, PALB2, RAD51C, RAD51D.    The patient reported approximately 56-monthhistory of progressive urinary frequency.  This was initially worked up by testing and treating empirically for urinary tract infections.  When that failed to resolve her symptoms she was referred to a urologist, Dr. WJeffie Pollock who performed a pelvic examination which palpated a mass in the pelvis.  Due to concern that her urinary frequency symptoms were secondary to extrinsic compression a CT scan of the abdomen and pelvis was ordered.  Of note her urinary  analysis was fairly unremarkable.   [facility] showed a 14.5 x 14 x 12 cm solid and cystic right ovarian mass worrisome for ovarian carcinoma.  There were no findings for intraperitoneal metastatic disease.  There is no lymphadenopathy present.   A transvaginal ultrasound on 04/12/2020 confirmed this finding of an ovarian mass.  The uterus itself measured 7.2 x 3.2 x 4.6 cm with a 6 mm endometrium.  The right ovary was not visualized.  The left ovary was not visualized.  There was a large complex cystic mass filling the pelvis measuring 14.5 x 10.9 x 13.4 cm.  The majority of this was a complicated cystic locule.  However inferiorly was complex solid and cystic intracystic mass identified estimated to be 5.7 x 6.5 x 5 cm with multiple septations.  There was blood flow within the mural nodule.   CA 125 tumor marker was elevated at 114 on 04/20/20.  On 05/03/2020 she underwent a robotic assisted total hysterectomy with bilateral salpingo-oophorectomy, omentectomy, radical tumor debulking, and omentectomy.  Intraoperative findings were significant for a grossly normal-appearing 7 cm uterus and grossly normal-appearing left tube and ovary.  The right ovary was replaced by 15 cm cystic and solid mass which was solid at its inferior aspect and densely adherent and infiltrating into the right uterosacral ligament and right parametrial tissues.  The rectum was adherent to the posterior uterus and cervix in the midline.  There was no gross upper abdominal disease.  There was trace ascites that was amber in color.  There was unavoidable cyst ruptured during the procedure due to its infiltrative nature.  Diaphragms were smooth.  No gross abnormalities was seen in  the intestines.  There was no gross residual tumor at the completion of the procedure representing a complete, R0 resection.   Final pathology revealed a clear cell carcinoma of the right ovary measuring  13.2 cm.  The carcinoma was confined to the ovary without involvement of the ovarian surface.  The left ovary tube and uterus were all benign.  The omentum was benign as were the peritoneal biopsies for staging.  Peritoneal washings were negative.  Due to the infiltrative nature of the tumor into the pelvic sidewall she was determined to have a stage II cancer, grade 3 clear cell.  Due to the locally infiltrative nature of the tumor and its high-grade she was recommended to have adjuvant chemotherapy for 6 cycles of carboplatin and paclitaxel in accordance with NCCN guidelines.  Germline and somatic testing for HRD and BRCA was negative.    She went on to receive 6 cycles of carboplatin and paclitaxel chemotherapy with Dr Delton Coombes at [facility] between 06/06/20 and 09/19/20. She tolerated therapy well with toxicity of neuropathy persistent.   CA 125 postoperatively and pre-treatment was 46.5 on 08/08/20.   After completion of therapy it was 41 on 09/19/20.   Post-treatment CT abd/pelvis 10/16/2020 revealed no evidence of recurrent or metastatic disease.  There is minimal increased density at the vaginal apex that was felt to likely reflect postoperative changes with no measurable soft tissue in this location.  They recommended attention on follow-up.   CA 125 on 10/16/20 was 40.9. CA 125 on 01/22/21 was 38.8. CA 125 on 04/30/21 was normal at 30.1. CA 125 on 08/05/21 was normal at 32.3.   CT of the abdomen and pelvis on 08/06/2021: No findings of active malignancy.  Aortic atherosclerosis as well as systemic atherosclerosis noted.  Mild cardiomegaly.  Hiatal hernia.  Prominent stool throughout the colon favors constipation.  Sigmoid colon diverticulosis.  Interval History: Doing well.  Denies any abdominal or pelvic pain.  Reports good appetite, endorses normal bowel function, is using MiraLAX about 3 times a week with good relief.  Continues to have frequency at night as well as urinary incontinence.  Saw  Dr. Roni Bread after her last visit with me, has been working with pelvic floor physical therapist.  Denies any vaginal bleeding or discharge.   Past Medical/Surgical History: Past Medical History:  Diagnosis Date   A-fib ([place])    Anxiety    Arthritis    Asthma    hx of    Cancer ([place])    ovarian   Dyspnea    with exertion    H/O seasonal allergies    Heart murmur    as aa child    Hypercholesteremia    Hypertension    Spinal stenosis     Past Surgical History:  Procedure Laterality Date   ABDOMINAL HYSTERECTOMY     BIOPSY  05/08/2020   Procedure: BIOPSY;  Surgeon: Harvel Quale, MD;  [facility]: [facility];  Service: Gastroenterology;;   BREAST SURGERY     CARDIAC CATHETERIZATION     CHOLECYSTECTOMY     COLONOSCOPY N/A 08/14/2015   Procedure: COLONOSCOPY;  Surgeon: Aviva Signs, MD;  [facility];  Service: Gastroenterology;  Laterality: N/A;   ESOPHAGOGASTRODUODENOSCOPY (EGD) WITH PROPOFOL N/A 05/08/2020   Procedure: ESOPHAGOGASTRODUODENOSCOPY (EGD) WITH PROPOFOL;  Surgeon: Harvel Quale, MD;  [facility];  Service: Gastroenterology;  Laterality: N/A;   ESOPHAGOGASTRODUODENOSCOPY (EGD) WITH PROPOFOL N/A 08/07/2020   Procedure: ESOPHAGOGASTRODUODENOSCOPY (EGD) WITH PROPOFOL;  Surgeon:  Harvel Quale, MD;  [facility]: [facility];  Service: Gastroenterology;  Laterality: N/A;  7:30   IR IMAGING GUIDED PORT INSERTION  06/04/2020   ROBOTIC ASSISTED TOTAL HYSTERECTOMY WITH BILATERAL SALPINGO OOPHERECTOMY N/A 05/03/2020   Procedure: XI ROBOTIC ASSISTED TOTAL HYSTERECTOMY WITH BILATERAL SALPINGO OOPHORECTOMY, OMENTECTOMY,RADICAL TUMOR DEBULKING, RIGID PROSTOSCOPY;  Surgeon: Everitt Amber, MD;  Location: [facility];  Service: Gynecology;  Laterality: N/A;   THROMBECTOMY FEMORAL ARTERY Right 12/17/2013   Procedure: REPAIR OF RIGHT FEMORAL ARTERY PSEUDOANEURYSM;  Surgeon: Elam Dutch, MD;  Location: [facility];  Service: Vascular;   Laterality: Right;    Family History  Problem Relation Age of Onset   Hypertension Mother    Heart failure Mother    Asthma Mother    Ulcers Mother    Benign prostatic hyperplasia Father    Ulcers Father    Esophageal varices Father    Atrial fibrillation Sister    Atrial fibrillation Brother    Heart disease Brother    Melanoma Maternal Aunt    Colon cancer Neg Hx    Breast cancer Neg Hx    Ovarian cancer Neg Hx    Endometrial cancer Neg Hx    Pancreatic cancer Neg Hx    Prostate cancer Neg Hx     Social History   Socioeconomic History   Marital status: Married    Spouse name: Not on file   Number of children: 4   Years of education: college   Highest education level: Not on file  Occupational History   Occupation: retired Marine scientist  Tobacco Use   Smoking status: Never   Smokeless tobacco: Never  Vaping Use   Vaping Use: Never used  Substance and Sexual Activity   Alcohol use: No   Drug use: No   Sexual activity: Not Currently  Other Topics Concern   Not on file  Social History Narrative   Lives at home with husband.   Left-handed.   Caffeine use: 1 cup coffee (half caff), half can of Coke   Social Determinants of Health   Financial Resource Strain: Not on file  Food Insecurity: Not on file  Transportation Needs: Not on file  Physical Activity: Insufficiently Active (05/29/2020)   Exercise Vital Sign    Days of Exercise per Week: 7 days    Minutes of Exercise per Session: 10 min  Stress: Not on file  Social Connections: Not on file    Current Medications:  Current Outpatient Medications:    acetaminophen (TYLENOL) 500 MG tablet, Take 1,000 mg by mouth 3 (three) times daily., Disp: , Rfl:    albuterol (VENTOLIN HFA) 108 (90 Base) MCG/ACT inhaler, Inhale 2 puffs into the lungs every 4 (four) hours as needed for wheezing or shortness of breath., Disp: 18 g, Rfl: 2   amLODipine (NORVASC) 10 MG tablet, Take 1 tablet (10 mg total) by mouth daily with lunch.  For BP, Disp: 30 tablet, Rfl: 3   aspirin EC 81 MG tablet, Take 81 mg by mouth daily. Swallow whole., Disp: , Rfl:    b complex vitamins capsule, Take 1 capsule by mouth at bedtime., Disp: , Rfl:    Calcium Carb-Cholecalciferol (CALCIUM 600/VITAMIN D PO), Take 600 mg by mouth in the morning and at bedtime., Disp: , Rfl:    cetirizine (ZYRTEC) 10 MG tablet, Take 10 mg by mouth daily., Disp: , Rfl:    Cholecalciferol (VITAMIN D) 125 MCG (5000 UT) CAPS, Take 5,000 Units by mouth daily., Disp: , Rfl:  Coenzyme Q10 (COQ10) 100 MG CAPS, Take 100 mg by mouth at bedtime. , Disp: , Rfl:    cyclobenzaprine (FLEXERIL) 10 MG tablet, Take 5 mg by mouth See admin instructions. Take 5 mg at night, may take a second 5 mg dose during the day as needed for muscle spasms, Disp: , Rfl:    diltiazem (CARDIZEM) 30 MG tablet, Take 1 tablet every 4 hours AS NEEDED for AFIB heart rate >100 (Patient taking differently: Take 30 mg by mouth every 4 (four) hours as needed (AFIB heart rate > 100).), Disp: 30 tablet, Rfl: 3   fluticasone (FLONASE) 50 MCG/ACT nasal spray, Place 1 spray into both nostrils daily. , Disp: , Rfl:    gabapentin (NEURONTIN) 300 MG capsule, Take 300 mg by mouth 3 (three) times daily., Disp: , Rfl:    lidocaine-prilocaine (EMLA) cream, Apply to affected area once, Disp: 30 g, Rfl: 3   lisinopril (ZESTRIL) 30 MG tablet, Take 1 tablet (30 mg total) by mouth in the morning. Okay to restart around Monday, 05/14/2020 if blood pressure stable (Patient taking differently: Take 30 mg by mouth in the morning.), Disp: 30 tablet, Rfl: 3   Magnesium 100 MG TABS, Take 50 mg by mouth at bedtime., Disp: , Rfl:    melatonin 5 MG TABS, Take 2.5 mg by mouth at bedtime as needed (Sleep)., Disp: , Rfl:    metoprolol succinate (TOPROL-XL) 100 MG 24 hr tablet, Take 100 mg by mouth in the morning. Take with or immediately following a meal., Disp: , Rfl:    Multiple Vitamins-Minerals (CENTRUM SILVER ADULT 50+ PO), Take 1 tablet  by mouth daily., Disp: , Rfl:    Omega-3 Fatty Acids (FISH OIL) 1000 MG CAPS, Take 1,000 mg by mouth daily., Disp: , Rfl:    ondansetron (ZOFRAN) 4 MG tablet, Take 1 tablet (4 mg total) by mouth every 6 (six) hours as needed for nausea., Disp: 20 tablet, Rfl: 0   pantoprazole (PROTONIX) 40 MG tablet, Take 1 tablet (40 mg total) by mouth daily., Disp: 90 tablet, Rfl: 3   polyethylene glycol powder (GLYCOLAX/MIRALAX) 17 GM/SCOOP powder, Take 17 g by mouth daily as needed for mild constipation or moderate constipation (With lunch)., Disp: , Rfl:    prochlorperazine (COMPAZINE) 10 MG tablet, Take 1 tablet (10 mg total) by mouth every 6 (six) hours as needed (Nausea or vomiting)., Disp: 30 tablet, Rfl: 1   simvastatin (ZOCOR) 20 MG tablet, Take 20 mg by mouth at bedtime., Disp: , Rfl:    sodium chloride (OCEAN) 0.65 % SOLN nasal spray, Place 1 spray into both nostrils See admin instructions. Use 1 spray in each nostril in the morning may use a second dose at night as needed for congestion, Disp: , Rfl:    tetrahydrozoline-zinc (VISINE-AC) 0.05-0.25 % ophthalmic solution, Place 1 drop into both eyes daily., Disp: , Rfl:    triamcinolone (KENALOG) 0.1 %, Apply 1 application topically daily as needed (irritation)., Disp: , Rfl:    Turmeric (QC TUMERIC COMPLEX PO), Take by mouth., Disp: , Rfl:    vitamin C (ASCORBIC ACID) 500 MG tablet, Take 1,000 mg by mouth 2 (two) times daily., Disp: , Rfl:    Vitamins A & D (VITAMIN A & D) ointment, Apply 1 application topically daily as needed for dry skin (irritation)., Disp: , Rfl:    ALPHA LIPOIC ACID PO, Take by mouth. (Patient not taking: Reported on 11/26/2021), Disp: , Rfl:   Review of Systems: Denies appetite changes, fevers,  chills, fatigue, unexplained weight changes. Denies hearing loss, neck lumps or masses, mouth sores, ringing in ears or voice changes. Denies cough or wheezing.  Denies shortness of breath. Denies chest pain or palpitations. Denies leg  swelling. Denies abdominal distention, pain, blood in stools, constipation, diarrhea, nausea, vomiting, or early satiety. Denies pain with intercourse, dysuria, frequency, hematuria. Denies hot flashes, pelvic pain, vaginal bleeding or vaginal discharge.   Denies joint pain, back pain or muscle pain/cramps. Denies itching, rash, or wounds. Denies dizziness, headaches, numbness or seizures. Denies swollen lymph nodes or glands, denies easy bruising or bleeding. Denies anxiety, depression, confusion, or decreased concentration.  Physical Exam: BP (!) 170/62 (BP Location: Left Arm, Patient Position: Sitting)   Pulse 65   Temp 98.8 F (37.1 C) (Oral)   Resp 16   Ht _0  (1.448 m)   Wt 174 lb 8 oz (79.2 kg)   SpO2 100%   BMI 37.76 kg/m  General: Alert, oriented, no acute distress. HEENT: Normocephalic, atraumatic, sclera anicteric. Chest: Labored breathing on room air. Abdomen: Obese, soft, nontender.  Normoactive bowel sounds.  No masses or hepatosplenomegaly appreciated.  Well-healed incisions. Extremities: Grossly normal range of motion.  Warm, well perfused.  Trace edema bilaterally. Skin: No rashes or lesions noted. Lymphatics: No cervical, supraclavicular, or inguinal adenopathy. GU: Normal appearing external genitalia without erythema, excoriation, or lesions.  Speculum exam reveals mildly atrophic vaginal mucosa, no lesions appreciated.  Laxity of the anterior vaginal wall.  Bimanual exam reveals no nodularity or masses.  Rectovaginal exam confirms these findings.  Laboratory & Radiologic Studies: Component Ref Range & Units 3 mo ago (08/05/21) 7 mo ago (04/30/21) 10 mo ago (01/22/21) 1 yr ago (10/16/20) 1 yr ago (09/19/20) 1 yr ago (08/08/20) 1 yr ago (04/20/20)  Cancer Antigen (CA) 125 0.0 - 38.1 U/mL 32.3  30.1 CM  38.8 High  CM  40.9 High  CM  41.0 High  CM  46.5 High  CM  114.0 High      Assessment & Plan: GRICELDA FOLAND is a 76 y.o. woman with history of stage II clear  cell ovarian cancer s/p robotic assisted total hysterectomy, BSO, omentectomy, staging on 05/03/20. BRCA/HRD negative. S/p adjuvant carb/tax x 6 completed 09/19/20. Complete clinical response (upper limit normal but stable CA125, negative scans, negative exam).   The patient is overall doing very well and is NED on exam today.  Recent CT scan was negative for any evidence of disease.  Additionally, her CA-125 has now normalized and dropped below the upper limit of normal.  We will plan to get repeat blood work today.   Per NCCN surveillance recommendations, I recommend continuing surveillance visits every 3 months with an exam and CA-125 until at least 2 years out from completion of adjuvant treatment.  She is scheduled to see Dr. Delton Coombes in August and will see me back in 6 months.  We reviewed signs and symptoms that would be concerning for cancer recurrence, and I have urged her to call if she develops any of these between visits.  She is following with urology for her urinary incontinence.  28 minutes of total time was spent for this patient encounter, including preparation, face-to-face counseling with the patient and coordination of care, and documentation of the encounter.  Jeral Pinch, MD  Division of Gynecologic Oncology  Department of Obstetrics and Gynecology  [facility]

## 2021-11-28 NOTE — Patient Instructions (Signed)
It was good to see you today.  I do not see or feel any evidence of cancer recurrence.  The office will call you with your CA-125 results once back.  I will plan to see you in 6 months, in late November or early December.  Please call back sometime in October to get this visit scheduled.  Always, if you develop new and concerning symptoms before your next visit, please call to see me sooner.

## 2021-11-29 ENCOUNTER — Telehealth: Payer: Self-pay

## 2021-11-29 LAB — CA 125: Cancer Antigen (CA) 125: 33 U/mL (ref 0.0–38.1)

## 2021-11-29 NOTE — Telephone Encounter (Signed)
Attempted to contact pt x2 to review recent CA 125 results. No answer. Left VM stating for pt to return call to our office.

## 2021-11-29 NOTE — Telephone Encounter (Signed)
Pt informed that per Dr. Berline Lopes, CA 125 results were normal at 33.0 U/mL. Pt verbalized understanding.

## 2021-12-10 DIAGNOSIS — M9902 Segmental and somatic dysfunction of thoracic region: Secondary | ICD-10-CM | POA: Diagnosis not present

## 2021-12-10 DIAGNOSIS — H25813 Combined forms of age-related cataract, bilateral: Secondary | ICD-10-CM | POA: Diagnosis not present

## 2021-12-10 DIAGNOSIS — H524 Presbyopia: Secondary | ICD-10-CM | POA: Diagnosis not present

## 2021-12-10 DIAGNOSIS — H16223 Keratoconjunctivitis sicca, not specified as Sjogren's, bilateral: Secondary | ICD-10-CM | POA: Diagnosis not present

## 2021-12-10 DIAGNOSIS — H11153 Pinguecula, bilateral: Secondary | ICD-10-CM | POA: Diagnosis not present

## 2021-12-10 DIAGNOSIS — M531 Cervicobrachial syndrome: Secondary | ICD-10-CM | POA: Diagnosis not present

## 2021-12-10 DIAGNOSIS — M47812 Spondylosis without myelopathy or radiculopathy, cervical region: Secondary | ICD-10-CM | POA: Diagnosis not present

## 2021-12-10 DIAGNOSIS — M9901 Segmental and somatic dysfunction of cervical region: Secondary | ICD-10-CM | POA: Diagnosis not present

## 2021-12-10 DIAGNOSIS — H04123 Dry eye syndrome of bilateral lacrimal glands: Secondary | ICD-10-CM | POA: Diagnosis not present

## 2021-12-10 DIAGNOSIS — H25013 Cortical age-related cataract, bilateral: Secondary | ICD-10-CM | POA: Diagnosis not present

## 2021-12-10 DIAGNOSIS — M7711 Lateral epicondylitis, right elbow: Secondary | ICD-10-CM | POA: Diagnosis not present

## 2021-12-10 DIAGNOSIS — H2513 Age-related nuclear cataract, bilateral: Secondary | ICD-10-CM | POA: Diagnosis not present

## 2021-12-14 ENCOUNTER — Other Ambulatory Visit: Payer: Self-pay | Admitting: Nurse Practitioner

## 2021-12-17 DIAGNOSIS — H1045 Other chronic allergic conjunctivitis: Secondary | ICD-10-CM | POA: Diagnosis not present

## 2021-12-18 DIAGNOSIS — R350 Frequency of micturition: Secondary | ICD-10-CM | POA: Diagnosis not present

## 2021-12-18 DIAGNOSIS — M6289 Other specified disorders of muscle: Secondary | ICD-10-CM | POA: Diagnosis not present

## 2021-12-18 DIAGNOSIS — M62838 Other muscle spasm: Secondary | ICD-10-CM | POA: Diagnosis not present

## 2021-12-18 DIAGNOSIS — M6281 Muscle weakness (generalized): Secondary | ICD-10-CM | POA: Diagnosis not present

## 2021-12-18 DIAGNOSIS — Z87440 Personal history of urinary (tract) infections: Secondary | ICD-10-CM | POA: Diagnosis not present

## 2021-12-19 DIAGNOSIS — M6281 Muscle weakness (generalized): Secondary | ICD-10-CM | POA: Diagnosis not present

## 2021-12-19 DIAGNOSIS — M6289 Other specified disorders of muscle: Secondary | ICD-10-CM | POA: Diagnosis not present

## 2021-12-19 DIAGNOSIS — R3915 Urgency of urination: Secondary | ICD-10-CM | POA: Diagnosis not present

## 2021-12-19 DIAGNOSIS — M62838 Other muscle spasm: Secondary | ICD-10-CM | POA: Diagnosis not present

## 2021-12-31 DIAGNOSIS — M531 Cervicobrachial syndrome: Secondary | ICD-10-CM | POA: Diagnosis not present

## 2021-12-31 DIAGNOSIS — M47812 Spondylosis without myelopathy or radiculopathy, cervical region: Secondary | ICD-10-CM | POA: Diagnosis not present

## 2021-12-31 DIAGNOSIS — M9901 Segmental and somatic dysfunction of cervical region: Secondary | ICD-10-CM | POA: Diagnosis not present

## 2021-12-31 DIAGNOSIS — M9902 Segmental and somatic dysfunction of thoracic region: Secondary | ICD-10-CM | POA: Diagnosis not present

## 2021-12-31 DIAGNOSIS — M7711 Lateral epicondylitis, right elbow: Secondary | ICD-10-CM | POA: Diagnosis not present

## 2022-01-22 ENCOUNTER — Encounter: Payer: Self-pay | Admitting: Cardiology

## 2022-01-22 ENCOUNTER — Ambulatory Visit: Payer: Medicare Other | Admitting: Cardiology

## 2022-01-22 VITALS — BP 140/90 | HR 92 | Ht 59.0 in | Wt 174.8 lb

## 2022-01-22 DIAGNOSIS — E782 Mixed hyperlipidemia: Secondary | ICD-10-CM

## 2022-01-22 DIAGNOSIS — I10 Essential (primary) hypertension: Secondary | ICD-10-CM | POA: Diagnosis not present

## 2022-01-22 DIAGNOSIS — I4891 Unspecified atrial fibrillation: Secondary | ICD-10-CM | POA: Diagnosis not present

## 2022-01-22 MED ORDER — DILTIAZEM HCL 30 MG PO TABS: ORAL_TABLET | ORAL | 3 refills | Status: DC

## 2022-01-22 NOTE — Patient Instructions (Signed)
Medication Instructions:  Your physician recommends that you continue on your current medications as directed. Please refer to the Current Medication list given to you today.   Labwork: Fasting Lipid Panel-[facility]  Testing/Procedures: none  Follow-Up:  Your physician recommends that you schedule a follow-up appointment in: 6 months  Any Other Special Instructions Will Be Listed Below (If Applicable).  If you need a refill on your cardiac medications before your next appointment, please call your pharmacy.

## 2022-01-22 NOTE — Progress Notes (Signed)
Clinical Summary April Guerra is a 76 y.o.female seen today as a new consult. Previously seen in [place] [facility] in 2019  Afib - history of prior DCCV 05/01/2018 in setting of newly diagnosed afib, deemed to be of recent onset within hours to presentation - no recent palpitations - home HRs 60s-70s at home - taking diltaizem just prn.   - had been on eliquis for about 3 years, stopped in 2021.  - reports bleeding ulcer after surgery. Isolated bleeding.   04/2020 non bleeding ulcer, erythema 07/2020: normal stomach  2.HTN - home bp's 130s/60s-70s - frequent urination, would avoid diuretic  3. Hyperlipidemia  - she is on simvastatin, reports dose lowered to '20mg'$  daily about 4 months ago. Symptoms no better.  - history of neuropathy, carpal tunnel      4. History   5. LE edema -   She reports normal cath [id number], complicated by pseudoaneurysm required a surgery Past Medical History:  Diagnosis Date   A-fib ([place])    Anxiety    Arthritis    Asthma    hx of    Cancer ([place])    ovarian   Dyspnea    with exertion    H/O seasonal allergies    Heart murmur    as aa child    Hypercholesteremia    Hypertension    Spinal stenosis      Allergies  Allergen Reactions   Morphine And Related Nausea And Vomiting   Zofran [Ondansetron]     Causes minor constipation, tolerates low doses       Current Outpatient Medications  Medication Sig Dispense Refill   acetaminophen (TYLENOL) 500 MG tablet Take 1,000 mg by mouth 3 (three) times daily.     albuterol (VENTOLIN HFA) 108 (90 Base) MCG/ACT inhaler Inhale 2 puffs into the lungs every 4 (four) hours as needed for wheezing or shortness of breath. 18 g 2   ALPHA LIPOIC ACID PO Take by mouth. (Patient not taking: Reported on 11/26/2021)     amLODipine (NORVASC) 10 MG tablet Take 1 tablet (10 mg total) by mouth daily with lunch. For BP 30 tablet 3   aspirin EC 81 MG tablet Take 81 mg by mouth daily. Swallow whole.     b complex  vitamins capsule Take 1 capsule by mouth at bedtime.     Calcium Carb-Cholecalciferol (CALCIUM 600/VITAMIN D PO) Take 600 mg by mouth in the morning and at bedtime.     cetirizine (ZYRTEC) 10 MG tablet Take 10 mg by mouth daily.     Cholecalciferol (VITAMIN D) 125 MCG (5000 UT) CAPS Take 5,000 Units by mouth daily.     Coenzyme Q10 (COQ10) 100 MG CAPS Take 100 mg by mouth at bedtime.      cyclobenzaprine (FLEXERIL) 10 MG tablet Take 5 mg by mouth See admin instructions. Take 5 mg at night, may take a second 5 mg dose during the day as needed for muscle spasms     diltiazem (CARDIZEM) 30 MG tablet Take 1 tablet every 4 hours AS NEEDED for AFIB heart rate >100 (Patient taking differently: Take 30 mg by mouth every 4 (four) hours as needed (AFIB heart rate > 100).) 30 tablet 3   fluticasone (FLONASE) 50 MCG/ACT nasal spray Place 1 spray into both nostrils daily.      gabapentin (NEURONTIN) 300 MG capsule Take 300 mg by mouth 3 (three) times daily.     lisinopril (ZESTRIL) 30 MG tablet Take 1  tablet (30 mg total) by mouth in the morning. Okay to restart around Monday, 05/14/2020 if blood pressure stable (Patient taking differently: Take 30 mg by mouth in the morning.) 30 tablet 3   Magnesium 100 MG TABS Take 50 mg by mouth at bedtime.     melatonin 5 MG TABS Take 2.5 mg by mouth at bedtime as needed (Sleep).     metoprolol succinate (TOPROL-XL) 100 MG 24 hr tablet Take 100 mg by mouth in the morning. Take with or immediately following a meal.     Multiple Vitamins-Minerals (CENTRUM SILVER ADULT 50+ PO) Take 1 tablet by mouth daily.     Omega-3 Fatty Acids (FISH OIL) 1000 MG CAPS Take 1,000 mg by mouth daily.     ondansetron (ZOFRAN) 4 MG tablet Take 1 tablet (4 mg total) by mouth every 6 (six) hours as needed for nausea. 20 tablet 0   pantoprazole (PROTONIX) 40 MG tablet Take 1 tablet (40 mg total) by mouth daily. 90 tablet 3   polyethylene glycol powder (GLYCOLAX/MIRALAX) 17 GM/SCOOP powder Take 17 g  by mouth daily as needed for mild constipation or moderate constipation (With lunch).     simvastatin (ZOCOR) 20 MG tablet Take 20 mg by mouth at bedtime.     sodium chloride (OCEAN) 0.65 % SOLN nasal spray Place 1 spray into both nostrils See admin instructions. Use 1 spray in each nostril in the morning may use a second dose at night as needed for congestion     tetrahydrozoline-zinc (VISINE-AC) 0.05-0.25 % ophthalmic solution Place 1 drop into both eyes daily.     triamcinolone (KENALOG) 0.1 % Apply 1 application topically daily as needed (irritation).     Turmeric (QC TUMERIC COMPLEX PO) Take by mouth.     vitamin C (ASCORBIC ACID) 500 MG tablet Take 1,000 mg by mouth 2 (two) times daily.     Vitamins A & D (VITAMIN A & D) ointment Apply 1 application topically daily as needed for dry skin (irritation).     No current facility-administered medications for this visit.     Past Surgical History:  Procedure Laterality Date   ABDOMINAL HYSTERECTOMY     BIOPSY  05/08/2020   Procedure: BIOPSY;  Surgeon: Harvel Quale, MD;  [facility];  Service: Gastroenterology;;   BREAST SURGERY     CARDIAC CATHETERIZATION     CHOLECYSTECTOMY     COLONOSCOPY N/A 08/14/2015   Procedure: COLONOSCOPY;  Surgeon: Aviva Signs, MD;  [facility];  Service: Gastroenterology;  Laterality: N/A;   ESOPHAGOGASTRODUODENOSCOPY (EGD) WITH PROPOFOL N/A 05/08/2020   Procedure: ESOPHAGOGASTRODUODENOSCOPY (EGD) WITH PROPOFOL;  Surgeon: Harvel Quale, MD;  [facility];  Service: Gastroenterology;  Laterality: N/A;   ESOPHAGOGASTRODUODENOSCOPY (EGD) WITH PROPOFOL N/A 08/07/2020   Procedure: ESOPHAGOGASTRODUODENOSCOPY (EGD) WITH PROPOFOL;  Surgeon: Harvel Quale, MD;  [facility];  Service: Gastroenterology;  Laterality: N/A;  7:30   IR IMAGING GUIDED PORT INSERTION  06/04/2020   ROBOTIC ASSISTED TOTAL HYSTERECTOMY WITH BILATERAL SALPINGO  OOPHERECTOMY N/A 05/03/2020   Procedure: XI ROBOTIC ASSISTED TOTAL HYSTERECTOMY WITH BILATERAL SALPINGO OOPHORECTOMY, OMENTECTOMY,RADICAL TUMOR DEBULKING, RIGID PROSTOSCOPY;  Surgeon: Everitt Amber, MD;  Location: [facility];  Service: Gynecology;  Laterality: N/A;   THROMBECTOMY FEMORAL ARTERY Right 12/17/2013   Procedure: REPAIR OF RIGHT FEMORAL ARTERY PSEUDOANEURYSM;  Surgeon: Elam Dutch, MD;  Location: [facility];  Service: Vascular;  Laterality: Right;     Allergies  Allergen Reactions  Morphine And Related Nausea And Vomiting   Zofran [Ondansetron]     Causes minor constipation, tolerates low doses        Family History  Problem Relation Age of Onset   Hypertension Mother    Heart failure Mother    Asthma Mother    Ulcers Mother    Benign prostatic hyperplasia Father    Ulcers Father    Esophageal varices Father    Atrial fibrillation Sister    Atrial fibrillation Brother    Heart disease Brother    Melanoma Maternal Aunt    Colon cancer Neg Hx    Breast cancer Neg Hx    Ovarian cancer Neg Hx    Endometrial cancer Neg Hx    Pancreatic cancer Neg Hx    Prostate cancer Neg Hx      Social History Ms. Cureton reports that she has never smoked. She has never used smokeless tobacco. Ms. [place] reports no history of alcohol use.   Review of Systems CONSTITUTIONAL: No weight loss, fever, chills, weakness or fatigue.  HEENT: Eyes: No visual loss, blurred vision, double vision or yellow sclerae.No hearing loss, sneezing, congestion, runny nose or sore throat.  SKIN: No rash or itching.  CARDIOVASCULAR: per hpi RESPIRATORY: No shortness of breath, cough or sputum.  GASTROINTESTINAL: No anorexia, nausea, vomiting or diarrhea. No abdominal pain or blood.  GENITOURINARY: No burning on urination, no polyuria NEUROLOGICAL: No headache, dizziness, syncope, paralysis, ataxia, numbness or tingling in the extremities. No change in bowel or bladder control.  MUSCULOSKELETAL: No muscle,  back pain, joint pain or stiffness.  LYMPHATICS: No enlarged nodes. No history of splenectomy.  PSYCHIATRIC: No history of depression or anxiety.  ENDOCRINOLOGIC: No reports of sweating, cold or heat intolerance. No polyuria or polydipsia.  Marland Kitchen   Physical Examination Today's Vitals   01/22/22 1041  BP: (!) 140/90  Pulse: 92  SpO2: 98%  Weight: 174 lb 12.8 oz (79.3 kg)  Height: '4\' 11"'$  (1.499 m)   Body mass index is 35.31 kg/m.  Gen: resting comfortably, no acute distress HEENT: no scleral icterus, pupils equal round and reactive, no palptable cervical adenopathy,  CV: RRR, no m/r/g, no jvd Resp: Clear to auscultation bilaterally GI: abdomen is soft, non-tender, non-distended, normal bowel sounds, no hepatosplenomegaly MSK: extremities are warm, no edema.  Skin: warm, no rash Neuro:  no focal deficits Psych: appropriate affect   Diagnostic Studies  04/2018 echo  Study Conclusions   - Left ventricle: The cavity size was normal. Wall thickness was    increased in a pattern of moderate LVH. Systolic function was    normal. The estimated ejection fraction was in the range of 60%    to 65%. Wall motion was normal; there were no regional wall    motion abnormalities. Left ventricular diastolic function    parameters were normal.  - Mitral valve: Mildly thickened leaflets . There was mild    regurgitation.  - Left atrium: The atrium was mildly dilated.  - Right atrium: The atrium was at the upper limits of normal in    size.  - Inferior vena cava: The vessel was normal in size. The    respirophasic diameter changes were in the normal range (>= 50%),    consistent with normal central venous pressure.   Assessment and Plan   1.Afib - continue current meds  2. HTN - elevated here but home bp's at goal, continue current meds  3. Hyperlipidemia - continue current meds  Arnoldo Lenis, M.D.

## 2022-02-04 ENCOUNTER — Ambulatory Visit (HOSPITAL_COMMUNITY)
Admission: RE | Admit: 2022-02-04 | Discharge: 2022-02-04 | Disposition: A | Discharge status: Home / Self Care | Payer: Medicare Other | Source: Ambulatory Visit | Attending: Hematology | Admitting: Hematology

## 2022-02-04 ENCOUNTER — Inpatient Hospital Stay: Payer: Medicare Other | Attending: Gynecologic Oncology

## 2022-02-04 VITALS — BP 176/80 | HR 80 | Temp 96.3°F | Resp 20

## 2022-02-04 DIAGNOSIS — Z836 Family history of other diseases of the respiratory system: Secondary | ICD-10-CM | POA: Diagnosis not present

## 2022-02-04 DIAGNOSIS — C482 Malignant neoplasm of peritoneum, unspecified: Secondary | ICD-10-CM | POA: Diagnosis not present

## 2022-02-04 DIAGNOSIS — K5730 Diverticulosis of large intestine without perforation or abscess without bleeding: Secondary | ICD-10-CM | POA: Diagnosis not present

## 2022-02-04 DIAGNOSIS — Z9049 Acquired absence of other specified parts of digestive tract: Secondary | ICD-10-CM | POA: Insufficient documentation

## 2022-02-04 DIAGNOSIS — R350 Frequency of micturition: Secondary | ICD-10-CM | POA: Insufficient documentation

## 2022-02-04 DIAGNOSIS — C561 Malignant neoplasm of right ovary: Secondary | ICD-10-CM | POA: Insufficient documentation

## 2022-02-04 DIAGNOSIS — Z808 Family history of malignant neoplasm of other organs or systems: Secondary | ICD-10-CM | POA: Insufficient documentation

## 2022-02-04 DIAGNOSIS — I700 Atherosclerosis of aorta: Secondary | ICD-10-CM | POA: Diagnosis not present

## 2022-02-04 DIAGNOSIS — G629 Polyneuropathy, unspecified: Secondary | ICD-10-CM | POA: Insufficient documentation

## 2022-02-04 DIAGNOSIS — Z8379 Family history of other diseases of the digestive system: Secondary | ICD-10-CM | POA: Diagnosis not present

## 2022-02-04 DIAGNOSIS — Z8249 Family history of ischemic heart disease and other diseases of the circulatory system: Secondary | ICD-10-CM | POA: Insufficient documentation

## 2022-02-04 DIAGNOSIS — K91841 Postprocedural hemorrhage and hematoma of a digestive system organ or structure following other procedure: Secondary | ICD-10-CM | POA: Diagnosis not present

## 2022-02-04 DIAGNOSIS — Z885 Allergy status to narcotic agent status: Secondary | ICD-10-CM | POA: Insufficient documentation

## 2022-02-04 DIAGNOSIS — Z90710 Acquired absence of both cervix and uterus: Secondary | ICD-10-CM | POA: Diagnosis not present

## 2022-02-04 DIAGNOSIS — Z888 Allergy status to other drugs, medicaments and biological substances status: Secondary | ICD-10-CM | POA: Diagnosis not present

## 2022-02-04 DIAGNOSIS — Z79899 Other long term (current) drug therapy: Secondary | ICD-10-CM | POA: Diagnosis not present

## 2022-02-04 DIAGNOSIS — Z90722 Acquired absence of ovaries, bilateral: Secondary | ICD-10-CM | POA: Insufficient documentation

## 2022-02-04 DIAGNOSIS — I4891 Unspecified atrial fibrillation: Secondary | ICD-10-CM | POA: Diagnosis not present

## 2022-02-04 LAB — COMPREHENSIVE METABOLIC PANEL
ALT: 22 U/L (ref 0–44)
AST: 24 U/L (ref 15–41)
Albumin: 4.4 g/dL (ref 3.5–5.0)
Alkaline Phosphatase: 52 U/L (ref 38–126)
Anion gap: 9 (ref 5–15)
BUN: 15 mg/dL (ref 8–23)
CO2: 23 mmol/L (ref 22–32)
Calcium: 9.1 mg/dL (ref 8.9–10.3)
Chloride: 103 mmol/L (ref 98–111)
Creatinine, Ser: 0.66 mg/dL (ref 0.44–1.00)
GFR, Estimated: >60 mL/min (ref >60)
Glucose, Bld: 89 mg/dL (ref 70–99)
Potassium: 3.7 mmol/L (ref 3.5–5.1)
Sodium: 135 mmol/L (ref 135–145)
Total Bilirubin: 0.3 mg/dL (ref 0.3–1.2)
Total Protein: 7.4 g/dL (ref 6.5–8.1)

## 2022-02-04 LAB — CBC WITH DIFFERENTIAL/PLATELET
Abs Immature Granulocytes: 0.01 10*3/uL (ref 0.00–0.07)
Basophils Absolute: 0 10*3/uL (ref 0.0–0.1)
Basophils Relative: 0 %
Eosinophils Absolute: 0 10*3/uL (ref 0.0–0.5)
Eosinophils Relative: 1 %
HCT: 37 % (ref 36.0–46.0)
Hemoglobin: 12.8 g/dL (ref 12.0–15.0)
Immature Granulocytes: 0 %
Lymphocytes Relative: 22 %
Lymphs Abs: 1.4 10*3/uL (ref 0.7–4.0)
MCH: 33.5 pg (ref 26.0–34.0)
MCHC: 34.6 g/dL (ref 30.0–36.0)
MCV: 96.9 fL (ref 80.0–100.0)
Monocytes Absolute: 0.5 10*3/uL (ref 0.1–1.0)
Monocytes Relative: 9 %
Neutro Abs: 4.3 10*3/uL (ref 1.7–7.7)
Neutrophils Relative %: 68 %
Platelets: 323 10*3/uL (ref 150–400)
RBC: 3.82 MIL/uL — ABNORMAL LOW (ref 3.87–5.11)
RDW: 12.7 % (ref 11.5–15.5)
WBC: 6.3 10*3/uL (ref 4.0–10.5)
nRBC: 0 % (ref 0.0–0.2)

## 2022-02-04 LAB — LIPID PANEL
Cholesterol: 175 mg/dL (ref 0–200)
HDL: 58 mg/dL (ref >40)
LDL Cholesterol: 95 mg/dL (ref 0–99)
Total CHOL/HDL Ratio: 3 RATIO
Triglycerides: 112 mg/dL (ref <150)
VLDL: 22 mg/dL (ref 0–40)

## 2022-02-04 LAB — POCT I-STAT CREATININE: Creatinine, Ser: 0.7 mg/dL (ref 0.44–1.00)

## 2022-02-04 MED ORDER — IOHEXOL 300 MG/ML  SOLN
100.0000 mL | Freq: Once | INTRAMUSCULAR | Status: AC | PRN
  Administered 2022-02-04: 100 mL via INTRAVENOUS

## 2022-02-04 MED ORDER — SODIUM CHLORIDE 0.9% FLUSH
10.0000 mL | Freq: Once | INTRAVENOUS | Status: AC
  Administered 2022-02-04: 10 mL via INTRAVENOUS

## 2022-02-04 MED ORDER — HEPARIN SOD (PORK) LOCK FLUSH 100 UNIT/ML IV SOLN
500.0000 [IU] | Freq: Once | INTRAVENOUS | Status: AC
  Administered 2022-02-04: 500 [IU] via INTRAVENOUS

## 2022-02-04 NOTE — Patient Instructions (Signed)
[facility]  Discharge Instructions: Thank you for choosing [facility] to provide your oncology and hematology care.  If you have a lab appointment with the [facility], please come in thru the Main Entrance and check in at the main information desk.  Wear comfortable clothing and clothing appropriate for easy access to any Portacath or PICC line.   We strive to give you quality time with your provider. You may need to reschedule your appointment if you arrive late (15 or more minutes).  Arriving late affects you and other patients whose appointments are after yours.  Also, if you miss three or more appointments without notifying the office, you may be dismissed from the clinic at the provider's discretion.      For prescription refill requests, have your pharmacy contact our office and allow 72 hours for refills to be completed.    Today you received the following chemotherapy and/or immunotherapy agents port flush lab      To help prevent nausea and vomiting after your treatment, we encourage you to take your nausea medication as directed.  BELOW ARE SYMPTOMS THAT SHOULD BE REPORTED IMMEDIATELY: *FEVER GREATER THAN 100.4 F (38 C) OR HIGHER *CHILLS OR SWEATING *NAUSEA AND VOMITING THAT IS NOT CONTROLLED WITH YOUR NAUSEA MEDICATION *UNUSUAL SHORTNESS OF BREATH *UNUSUAL BRUISING OR BLEEDING *URINARY PROBLEMS (pain or burning when urinating, or frequent urination) *BOWEL PROBLEMS (unusual diarrhea, constipation, pain near the anus) TENDERNESS IN MOUTH AND THROAT WITH OR WITHOUT PRESENCE OF ULCERS (sore throat, sores in mouth, or a toothache) UNUSUAL RASH, SWELLING OR PAIN  UNUSUAL VAGINAL DISCHARGE OR ITCHING   Items with * indicate a potential emergency and should be followed up as soon as possible or go to the Emergency Department if any problems should occur.  Please show the CHEMOTHERAPY ALERT CARD or IMMUNOTHERAPY ALERT CARD at check-in to the  Emergency Department and triage nurse.  Should you have questions after your visit or need to cancel or reschedule your appointment, please contact [facility] [phone]  and follow the prompts.  Office hours are 8:00 a.m. to 4:30 p.m. Monday - Friday. Please note that voicemails left after 4:00 p.m. may not be returned until the following business day.  We are closed weekends and major holidays. You have access to a nurse at all times for urgent questions. Please call the main number to the clinic [phone] and follow the prompts.  For any non-urgent questions, you may also contact your provider using MyChart. We now offer e-Visits for anyone 72 and older to request care online for non-urgent symptoms. For details visit [email or web address].   Also download the MyChart app! Go to the app store, search "MyChart", open the app, select [facility], and log in with your MyChart username and password.  Masks are optional in the cancer centers. If you would like for your care team to wear a mask while they are taking care of you, please let them know. For doctor visits, patients may have with them one support person who is at least 76 years old. At this time, visitors are not allowed in the infusion area.

## 2022-02-04 NOTE — Progress Notes (Signed)
Patients port flushed without difficulty.  Good blood return noted with no bruising or swelling noted at site.  Band aid applied.  VSS with discharge and left in satisfactory condition with no s/s of distress noted.   

## 2022-02-05 LAB — CA 125: Cancer Antigen (CA) 125: 33.1 U/mL (ref 0.0–38.1)

## 2022-02-10 ENCOUNTER — Inpatient Hospital Stay: Payer: Medicare Other | Admitting: Hematology

## 2022-02-10 ENCOUNTER — Ambulatory Visit (HOSPITAL_COMMUNITY): Payer: Medicare Other | Admitting: Hematology

## 2022-02-18 ENCOUNTER — Inpatient Hospital Stay: Payer: Medicare Other | Admitting: Hematology

## 2022-02-18 ENCOUNTER — Encounter: Payer: Self-pay | Admitting: Hematology

## 2022-02-18 VITALS — BP 159/82 | HR 79 | Temp 97.7°F | Resp 18 | Ht <= 58 in | Wt 171.8 lb

## 2022-02-18 DIAGNOSIS — K91841 Postprocedural hemorrhage and hematoma of a digestive system organ or structure following other procedure: Secondary | ICD-10-CM | POA: Diagnosis not present

## 2022-02-18 DIAGNOSIS — K5730 Diverticulosis of large intestine without perforation or abscess without bleeding: Secondary | ICD-10-CM | POA: Diagnosis not present

## 2022-02-18 DIAGNOSIS — I700 Atherosclerosis of aorta: Secondary | ICD-10-CM | POA: Diagnosis not present

## 2022-02-18 DIAGNOSIS — Z8249 Family history of ischemic heart disease and other diseases of the circulatory system: Secondary | ICD-10-CM | POA: Diagnosis not present

## 2022-02-18 DIAGNOSIS — Z885 Allergy status to narcotic agent status: Secondary | ICD-10-CM | POA: Diagnosis not present

## 2022-02-18 DIAGNOSIS — I4891 Unspecified atrial fibrillation: Secondary | ICD-10-CM | POA: Diagnosis not present

## 2022-02-18 DIAGNOSIS — Z888 Allergy status to other drugs, medicaments and biological substances status: Secondary | ICD-10-CM | POA: Diagnosis not present

## 2022-02-18 DIAGNOSIS — C482 Malignant neoplasm of peritoneum, unspecified: Secondary | ICD-10-CM | POA: Diagnosis not present

## 2022-02-18 DIAGNOSIS — Z79899 Other long term (current) drug therapy: Secondary | ICD-10-CM | POA: Diagnosis not present

## 2022-02-18 DIAGNOSIS — Z9049 Acquired absence of other specified parts of digestive tract: Secondary | ICD-10-CM | POA: Diagnosis not present

## 2022-02-18 DIAGNOSIS — C561 Malignant neoplasm of right ovary: Secondary | ICD-10-CM | POA: Diagnosis not present

## 2022-02-18 DIAGNOSIS — R350 Frequency of micturition: Secondary | ICD-10-CM | POA: Diagnosis not present

## 2022-02-18 DIAGNOSIS — Z8379 Family history of other diseases of the digestive system: Secondary | ICD-10-CM | POA: Diagnosis not present

## 2022-02-18 DIAGNOSIS — G629 Polyneuropathy, unspecified: Secondary | ICD-10-CM | POA: Diagnosis not present

## 2022-02-18 DIAGNOSIS — Z808 Family history of malignant neoplasm of other organs or systems: Secondary | ICD-10-CM | POA: Diagnosis not present

## 2022-02-18 DIAGNOSIS — Z836 Family history of other diseases of the respiratory system: Secondary | ICD-10-CM | POA: Diagnosis not present

## 2022-02-18 NOTE — Patient Instructions (Addendum)
[facility] at [facility] Discharge Instructions   You were seen and examined today by Dr. Delton Coombes.  He reviewed the results of your lab work and CT scan. All results are normal.   We will see you back in 6 months. We will repeat a CT scan and labs prior to next visit.    Thank you for choosing [facility] at [facility] to provide your oncology and hematology care.  To afford each patient quality time with our provider, please arrive at least 15 minutes before your scheduled appointment time.   If you have a lab appointment with the [facility] please come in thru the Main Entrance and check in at the main information desk.  You need to re-schedule your appointment should you arrive 10 or more minutes late.  We strive to give you quality time with our providers, and arriving late affects you and other patients whose appointments are after yours.  Also, if you no show three or more times for appointments you may be dismissed from the clinic at the providers discretion.     Again, thank you for choosing [facility].  Our hope is that these requests will decrease the amount of time that you wait before being seen by our physicians.       _____________________________________________________________  Should you have questions after your visit to [facility], please contact our office at [phone] and follow the prompts.  Our office hours are 8:00 a.m. and 4:30 p.m. Monday - Friday.  Please note that voicemails left after 4:00 p.m. may not be returned until the following business day.  We are closed weekends and major holidays.  You do have access to a nurse 24-7, just call the main number to the clinic [phone] and do not press any options, hold on the line and a nurse will answer the phone.    For prescription refill requests, have your pharmacy contact our office and allow 72 hours.    Due to Covid, you will  need to wear a mask upon entering the hospital. If you do not have a mask, a mask will be given to you at the Main Entrance upon arrival. For doctor visits, patients may have 1 support person age 55 or older with them. For treatment visits, patients can not have anyone with them due to social distancing guidelines and our immunocompromised population.

## 2022-02-18 NOTE — Progress Notes (Signed)
April Guerra, [place] [address]   CLINIC:  Medical Oncology/Hematology  PCP:  Manon Hilding, MD [address] [place] [address] [phone]   REASON FOR VISIT:  Follow-up for right ovarian cancer  PRIOR THERAPY:  1. TAH-BSO on 05/03/2020. 2. Carboplatin, paclitaxel & Aloxi x 6 cycles from 06/06/2020 to 09/19/2020.  NGS Results: not done  CURRENT THERAPY: surveillance  BRIEF ONCOLOGIC HISTORY:  Oncology History  Right ovarian epithelial cancer ([place])  05/03/2020 Initial Diagnosis   Right ovarian epithelial cancer ([place])   05/03/2020 Cancer Staging   Staging form: Ovary, Fallopian Tube, and Primary Peritoneal Carcinoma, AJCC 8th Edition - Clinical stage from 05/03/2020: FIGO Stage II, calculated as Stage Unknown (cT2b, cNX, cM0) - Signed by Everitt Amber, MD on 05/24/2020   06/06/2020 - 09/21/2020 Chemotherapy   Patient is on Treatment Plan : OVARIAN Carboplatin (AUC 6) / Paclitaxel (175) q21d x 6 cycles      Genetic Testing   Negative genetic testing. No pathogenic variants identified on the [employer]. The report date is 08/10/2020.  The CancerNext gene panel offered by [employer] includes sequencing and rearrangement analysis for the following 36 genes: APC*, ATM*, AXIN2, BARD1, BMPR1A, BRCA1*, BRCA2*, BRIP1*, CDH1*, CDK4, CDKN2A, CHEK2*, DICER1, MLH1*, MSH2*, MSH3, MSH6*, MUTYH*, NBN, NF1*, NTHL1, PALB2*, PMS2*, PTEN*, RAD51C*, RAD51D*, RECQL, SMAD4, SMARCA4, STK11 and TP53* (sequencing and deletion/duplication); HOXB13, POLD1 and POLE (sequencing only); EPCAM and GREM1 (deletion/duplication only).   Somatic genes analyzed through TumorNext-HRD: ATM, BARD1, BRCA1, BRCA2, BRIP1, CHEK2, MRE11A, NBN, PALB2, RAD51C, RAD51D.     CANCER STAGING:  Cancer Staging  Right ovarian epithelial cancer [facility]) Staging form: Ovary, Fallopian Tube, and Primary Peritoneal Carcinoma, AJCC 8th Edition - Clinical stage from  05/03/2020: FIGO Stage II, calculated as Stage Unknown (cT2b, cNX, cM0) - Signed by Everitt Amber, MD on 05/24/2020   INTERVAL HISTORY:  April Guerra, a 76 y.o. female, seen for follow-up of ovarian cancer.  Denies any abdominal pains, new onset.  Continues to have increased urinary frequency.  Appetite and energy levels are 100%.  Numbness has been stable in the arms and legs.  REVIEW OF SYSTEMS:  Review of Systems  Constitutional:  Negative for appetite change and fatigue.  Genitourinary:  Positive for frequency.   Neurological:  Positive for numbness (Arms and legs stable).  All other systems reviewed and are negative.   PAST MEDICAL/SURGICAL HISTORY:  Past Medical History:  Diagnosis Date   A-fib ([place])    Anxiety    Arthritis    Asthma    hx of    Cancer ([place])    ovarian   Dyspnea    with exertion    H/O seasonal allergies    Heart murmur    as aa child    Hypercholesteremia    Hypertension    Spinal stenosis    Past Surgical History:  Procedure Laterality Date   ABDOMINAL HYSTERECTOMY     BIOPSY  05/08/2020   Procedure: BIOPSY;  Surgeon: Harvel Quale, MD;  [facility];  Service: Gastroenterology;;   BREAST SURGERY     CARDIAC CATHETERIZATION     CHOLECYSTECTOMY     COLONOSCOPY N/A 08/14/2015   Procedure: COLONOSCOPY;  Surgeon: Aviva Signs, MD;  Location: [facility];  Service: Gastroenterology;  Laterality: N/A;   ESOPHAGOGASTRODUODENOSCOPY (EGD) WITH PROPOFOL N/A 05/08/2020   Procedure: ESOPHAGOGASTRODUODENOSCOPY (EGD) WITH PROPOFOL;  Surgeon: Harvel Quale, MD;  [facility];  Service: Gastroenterology;  Laterality: N/A;   ESOPHAGOGASTRODUODENOSCOPY (EGD) WITH PROPOFOL N/A 08/07/2020   Procedure: ESOPHAGOGASTRODUODENOSCOPY (EGD) WITH PROPOFOL;  Surgeon: Harvel Quale, MD;  [facility];  Service: Gastroenterology;  Laterality: N/A;  7:30   IR IMAGING GUIDED PORT INSERTION  06/04/2020    ROBOTIC ASSISTED TOTAL HYSTERECTOMY WITH BILATERAL SALPINGO OOPHERECTOMY N/A 05/03/2020   Procedure: XI ROBOTIC ASSISTED TOTAL HYSTERECTOMY WITH BILATERAL SALPINGO OOPHORECTOMY, OMENTECTOMY,RADICAL TUMOR DEBULKING, RIGID PROSTOSCOPY;  Surgeon: Everitt Amber, MD;  Location: [facility];  Service: Gynecology;  Laterality: N/A;   THROMBECTOMY FEMORAL ARTERY Right 12/17/2013   Procedure: REPAIR OF RIGHT FEMORAL ARTERY PSEUDOANEURYSM;  Surgeon: Elam Dutch, MD;  Location: [facility];  Service: Vascular;  Laterality: Right;    SOCIAL HISTORY:  Social History   Socioeconomic History   Marital status: Married    Spouse name: Not on file   Number of children: 4   Years of education: college   Highest education level: Not on file  Occupational History   Occupation: retired Marine scientist  Tobacco Use   Smoking status: Never   Smokeless tobacco: Never  Vaping Use   Vaping Use: Never used  Substance and Sexual Activity   Alcohol use: No   Drug use: No   Sexual activity: Not Currently  Other Topics Concern   Not on file  Social History Narrative   Lives at home with husband.   Left-handed.   Caffeine use: 1 cup coffee (half caff), half can of Coke   Social Determinants of Health   Financial Resource Strain: Low Risk  (05/29/2020)   Overall Financial Resource Strain (CARDIA)    Difficulty of Paying Living Expenses: Not hard at all  Food Insecurity: No Food Insecurity (05/29/2020)   Hunger Vital Sign    Worried About Running Out of Food in the Last Year: Never true    Ran Out of Food in the Last Year: Never true  Transportation Needs: No Transportation Needs (05/29/2020)   PRAPARE - Hydrologist (Medical): No    Lack of Transportation (Non-Medical): No  Physical Activity: Insufficiently Active (05/29/2020)   Exercise Vital Sign    Days of Exercise per Week: 7 days    Minutes of Exercise per Session: 10 min  Stress: No Stress Concern Present (05/29/2020)   [place]    Feeling of Stress : Not at all  Social Connections: Moderately Integrated (05/29/2020)   Social Connection and Isolation Panel [NHANES]    Frequency of Communication with Friends and Family: More than three times a week    Frequency of Social Gatherings with Friends and Family: More than three times a week    Attends Religious Services: More than 4 times per year    Active Member of [employer] or Organizations: No    Attends Archivist Meetings: Never    Marital Status: Married  Human resources officer Violence: Not At Risk (05/29/2020)   Humiliation, Afraid, Rape, and Kick questionnaire    Fear of Current or Ex-Partner: No    Emotionally Abused: No    Physically Abused: No    Sexually Abused: No    FAMILY HISTORY:  Family History  Problem Relation Age of Onset   Hypertension Mother    Heart failure Mother    Asthma Mother    Ulcers Mother    Benign prostatic hyperplasia Father    Ulcers Father  Esophageal varices Father    Atrial fibrillation Sister    Atrial fibrillation Brother    Heart disease Brother    Melanoma Maternal Aunt    Colon cancer Neg Hx    Breast cancer Neg Hx    Ovarian cancer Neg Hx    Endometrial cancer Neg Hx    Pancreatic cancer Neg Hx    Prostate cancer Neg Hx     CURRENT MEDICATIONS:  Current Outpatient Medications  Medication Sig Dispense Refill   acetaminophen (TYLENOL) 500 MG tablet Take 1,000 mg by mouth 3 (three) times daily.     albuterol (VENTOLIN HFA) 108 (90 Base) MCG/ACT inhaler Inhale 2 puffs into the lungs every 4 (four) hours as needed for wheezing or shortness of breath. 18 g 2   amLODipine (NORVASC) 10 MG tablet Take 1 tablet (10 mg total) by mouth daily with lunch. For BP 30 tablet 3   aspirin EC 81 MG tablet Take 81 mg by mouth daily. Swallow whole.     b complex vitamins capsule Take 1 capsule by mouth at bedtime.     Calcium Carb-Cholecalciferol (CALCIUM 600/VITAMIN D  PO) Take 600 mg by mouth in the morning and at bedtime.     cetirizine (ZYRTEC) 10 MG tablet Take 10 mg by mouth daily.     Cholecalciferol (VITAMIN D) 125 MCG (5000 UT) CAPS Take 5,000 Units by mouth daily.     Coenzyme Q10 (COQ10) 100 MG CAPS Take 100 mg by mouth at bedtime.      cyclobenzaprine (FLEXERIL) 10 MG tablet Take 5 mg by mouth See admin instructions. Take 5 mg at night, may take a second 5 mg dose during the day as needed for muscle spasms     diltiazem (CARDIZEM) 30 MG tablet Take 1 tablet every 4 hours AS NEEDED for AFIB heart rate >100 30 tablet 3   fluticasone (FLONASE) 50 MCG/ACT nasal spray Place 1 spray into both nostrils daily.      gabapentin (NEURONTIN) 300 MG capsule Take 300 mg by mouth 3 (three) times daily.     lisinopril (ZESTRIL) 30 MG tablet Take 1 tablet (30 mg total) by mouth in the morning. Okay to restart around Monday, 05/14/2020 if blood pressure stable (Patient taking differently: Take 30 mg by mouth in the morning.) 30 tablet 3   Magnesium 100 MG TABS Take 50 mg by mouth at bedtime.     melatonin 5 MG TABS Take 2.5 mg by mouth at bedtime as needed (Sleep).     metoprolol succinate (TOPROL-XL) 100 MG 24 hr tablet Take 100 mg by mouth in the morning. Take with or immediately following a meal.     Multiple Vitamins-Minerals (CENTRUM SILVER ADULT 50+ PO) Take 1 tablet by mouth daily.     Omega-3 Fatty Acids (FISH OIL) 1000 MG CAPS Take 1,000 mg by mouth daily.     ondansetron (ZOFRAN) 4 MG tablet Take 1 tablet (4 mg total) by mouth every 6 (six) hours as needed for nausea. 20 tablet 0   pantoprazole (PROTONIX) 40 MG tablet Take 1 tablet (40 mg total) by mouth daily. 90 tablet 3   polyethylene glycol powder (GLYCOLAX/MIRALAX) 17 GM/SCOOP powder Take 17 g by mouth daily as needed for mild constipation or moderate constipation (With lunch).     Polyvinyl Alcohol-Povidone PF (REFRESH) 1.4-0.6 % SOLN Apply 1 drop to eye as needed.     simvastatin (ZOCOR) 20 MG tablet  Take 20 mg by mouth at bedtime.  sodium chloride (OCEAN) 0.65 % SOLN nasal spray Place 1 spray into both nostrils See admin instructions. Use 1 spray in each nostril in the morning may use a second dose at night as needed for congestion     triamcinolone (KENALOG) 0.1 % Apply 1 application topically daily as needed (irritation).     vitamin C (ASCORBIC ACID) 500 MG tablet Take 1,000 mg by mouth 2 (two) times daily.     Vitamins A & D (VITAMIN A & D) ointment Apply 1 application topically daily as needed for dry skin (irritation).     No current facility-administered medications for this visit.    ALLERGIES:  Allergies  Allergen Reactions   Morphine And Related Nausea And Vomiting   Zofran [Ondansetron]     Causes minor constipation, tolerates low doses      PHYSICAL EXAM:  Performance status (ECOG): 1 - Symptomatic but completely ambulatory  Vitals:   02/18/22 1156  BP: (!) 159/82  Pulse: 79  Resp: 18  Temp: 97.7 F (36.5 C)  SpO2: 97%   Wt Readings from Last 3 Encounters:  02/18/22 171 lb 12.8 oz (77.9 kg)  01/22/22 174 lb 12.8 oz (79.3 kg)  11/28/21 174 lb 8 oz (79.2 kg)   Physical Exam Vitals reviewed.  Constitutional:      Appearance: Normal appearance.  Cardiovascular:     Rate and Rhythm: Normal rate and regular rhythm.     Pulses: Normal pulses.     Heart sounds: Normal heart sounds.  Pulmonary:     Effort: Pulmonary effort is normal.     Breath sounds: Normal breath sounds.  Abdominal:     Palpations: Abdomen is soft. There is no hepatomegaly, splenomegaly or mass.     Tenderness: There is no abdominal tenderness.  Lymphadenopathy:     Lower Body: No right inguinal adenopathy. No left inguinal adenopathy.  Neurological:     General: No focal deficit present.     Mental Status: She is alert and oriented to person, place, and time.  Psychiatric:        Mood and Affect: Mood normal.        Behavior: Behavior normal.      LABORATORY DATA:  I have  reviewed the labs as listed.     Latest Ref Rng & Units 02/04/2022   12:23 PM 08/05/2021    9:40 AM 04/30/2021    2:03 PM  CBC  WBC 4.0 - 10.5 K/uL 6.3  6.9  7.2   Hemoglobin 12.0 - 15.0 g/dL 12.8  12.7  12.9   Hematocrit 36.0 - 46.0 % 37.0  37.4  36.5   Platelets 150 - 400 K/uL 323  323  315       Latest Ref Rng & Units 02/04/2022   12:23 PM 02/04/2022   11:32 AM 08/05/2021    9:40 AM  CMP  Glucose 70 - 99 mg/dL 89   94   BUN 8 - 23 mg/dL 15   15   Creatinine 0.44 - 1.00 mg/dL 0.66  0.70  0.66   Sodium 135 - 145 mmol/L 135   136   Potassium 3.5 - 5.1 mmol/L 3.7   4.0   Chloride 98 - 111 mmol/L 103   101   CO2 22 - 32 mmol/L 23   25   Calcium 8.9 - 10.3 mg/dL 9.1   9.5   Total Protein 6.5 - 8.1 g/dL 7.4   7.5   Total Bilirubin 0.3 - 1.2  mg/dL 0.3   0.5   Alkaline Phos 38 - 126 U/L 52   55   AST 15 - 41 U/L 24   25   ALT 0 - 44 U/L 22   18     DIAGNOSTIC IMAGING:  I have independently reviewed the scans and discussed with the patient. CT Abdomen Pelvis W Contrast  Result Date: 02/05/2022 CLINICAL DATA:  Ovarian cancer, Primary peritoneal cancer status post hysterectomy, oophorectomy, and chemotherapy * Tracking Code: BO * EXAM: CT ABDOMEN AND PELVIS WITH CONTRAST TECHNIQUE: Multidetector CT imaging of the abdomen and pelvis was performed using the standard protocol following bolus administration of intravenous contrast. RADIATION DOSE REDUCTION: This exam was performed according to the departmental dose-optimization program which includes automated exposure control, adjustment of the mA and/or kV according to patient size and/or use of iterative reconstruction technique. CONTRAST:  145m OMNIPAQUE IOHEXOL 300 MG/ML SOLN, additional oral enteric contrast COMPARISON:  08/05/2021 FINDINGS: Lower chest: No acute abnormality. Hepatobiliary: No focal liver abnormality is seen. Status post cholecystectomy. No biliary dilatation. Pancreas: Unremarkable. No pancreatic ductal dilatation or  surrounding inflammatory changes. Spleen: Normal in size without significant abnormality. Adrenals/Urinary Tract: Adrenal glands are unremarkable. Kidneys are normal, without renal calculi, solid lesion, or hydronephrosis. Bladder is unremarkable. Stomach/Bowel: Stomach is within normal limits. Appendix appears normal. No evidence of bowel wall thickening, distention, or inflammatory changes. Sigmoid diverticula. Vascular/Lymphatic: Aortic atherosclerosis. No enlarged abdominal or pelvic lymph nodes. Reproductive: Status post hysterectomy and oophorectomy. Other: No abdominal wall hernia or abnormality. No ascites. Musculoskeletal: No acute osseous findings. IMPRESSION: 1. Status post hysterectomy and oophorectomy. No evidence of mass, lymphadenopathy, or metastatic disease in the abdomen or pelvis. 2. Status post cholecystectomy. 3. Sigmoid diverticulosis without evidence of acute diverticulitis. Aortic Atherosclerosis (ICD10-I70.0). Electronically Signed   By: ADelanna AhmadiM.D.   On: 02/05/2022 13:56     ASSESSMENT:  1.  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 --[id number] 04/20/2020 -Evaluated by Dr. RDenman Georgeon 05/24/2020. -6 cycles of carboplatin and paclitaxel was recommended. -Somatic and germline mutation testing was negative. -6 cycles of carboplatin and paclitaxel from 06/06/2020 through 09/19/2020.   2.  Atrial fibrillation: -Was on Eliquis. -She had postoperative GI bleed when Eliquis was discontinued.   PLAN:  1.  Stage II clear-cell right ovarian cancer: - She does not report any abdominal pain or bloating. - Labs from 02/04/2022 shows CA125 of 33.  LFTs are normal. - CTAP (02/04/2022): No evidence of metastatic disease. - RTC 6 months for follow-up with repeat scan and tumor marker. - Continue follow-up with Dr. TBerline Lopes   2.  Peripheral neuropathy: - She has quit taking gabapentin in  March 2023. - She now takes gabapentin 300 mg 1 pill at bedtime 3 times a week as needed.   3.  Genetic testing: - Somatic and germline testing was negative.    Orders placed this encounter:  Orders Placed This Encounter  Procedures   CT Abdomen Pelvis W Contrast   CBC with Differential   Comprehensive metabolic panel   CA [id number]    Matson Welch, MD [phone]

## 2022-02-19 ENCOUNTER — Telehealth: Payer: Self-pay | Admitting: *Deleted

## 2022-02-19 ENCOUNTER — Other Ambulatory Visit: Payer: Self-pay

## 2022-02-19 ENCOUNTER — Telehealth: Payer: Self-pay

## 2022-02-19 NOTE — Telephone Encounter (Signed)
Discussed with patient per Dr. Jenetta Downer - it should be reasonable to restart Eliquis as long as she takes Protonix daily.  There is always some risk of GI bleeding with anticoagulants, but he ulcer healed in her last EGD so the risk may be on the lower side She will need to avoid using high dose aspirin including Goody/BC powders, NSAIDs such as Aleve, ibuprofen, naproxen, Motrin, Voltaren or Advil (even the topical ones)  Patient verbalized understanding.

## 2022-02-19 NOTE — Telephone Encounter (Signed)
Patient made aware. Verbalized understanding. States that she is still taking the simvastatin 20 mg once a day.

## 2022-02-19 NOTE — Telephone Encounter (Signed)
Left message to return call 

## 2022-02-19 NOTE — Telephone Encounter (Signed)
-----   Message from Arnoldo Lenis, MD sent at 02/12/2022  3:45 PM EDT ----- Cholesterol looks fine, is she still taking the simvastatin '20mg'$  daily, please clarify  Zandra Abts MD

## 2022-02-19 NOTE — Telephone Encounter (Signed)
Patient saw cardiologist - Dr. Harl Bowie 01/22/22 and has history of a fib. Had gi bleed in 2021 and eliquis was stopped. States Dr. Harl Bowie wanted her to reach out to Dr. Jenetta Downer to see his thoughts on her starting back on eliquis. I do not see where patient has been seen in office. Had EGD on 05/08/20, 05/10/20 and 08/07/20.   Pt's number [phone]

## 2022-02-19 NOTE — Telephone Encounter (Signed)
Please let her know it should be reasonable to restart Eliquis as long as she takes Protonix daily.  There is always some risk of GI bleeding with anticoagulants, but he ulcer healed in her last EGD so the risk may be on the lower side She will need to avoid using high dose aspirin including Goody/BC powders, NSAIDs such as Aleve, ibuprofen, naproxen, Motrin, Voltaren or Advil (even the topical ones)

## 2022-02-27 DIAGNOSIS — K2100 Gastro-esophageal reflux disease with esophagitis, without bleeding: Secondary | ICD-10-CM | POA: Diagnosis not present

## 2022-02-27 DIAGNOSIS — E7849 Other hyperlipidemia: Secondary | ICD-10-CM | POA: Diagnosis not present

## 2022-02-27 DIAGNOSIS — I10 Essential (primary) hypertension: Secondary | ICD-10-CM | POA: Diagnosis not present

## 2022-02-27 DIAGNOSIS — R5382 Chronic fatigue, unspecified: Secondary | ICD-10-CM | POA: Diagnosis not present

## 2022-03-13 DIAGNOSIS — I4891 Unspecified atrial fibrillation: Secondary | ICD-10-CM | POA: Diagnosis not present

## 2022-03-13 DIAGNOSIS — E7849 Other hyperlipidemia: Secondary | ICD-10-CM | POA: Diagnosis not present

## 2022-03-13 DIAGNOSIS — G620 Drug-induced polyneuropathy: Secondary | ICD-10-CM | POA: Diagnosis not present

## 2022-03-13 DIAGNOSIS — I10 Essential (primary) hypertension: Secondary | ICD-10-CM | POA: Diagnosis not present

## 2022-03-13 DIAGNOSIS — K922 Gastrointestinal hemorrhage, unspecified: Secondary | ICD-10-CM | POA: Diagnosis not present

## 2022-03-13 DIAGNOSIS — Z23 Encounter for immunization: Secondary | ICD-10-CM | POA: Diagnosis not present

## 2022-03-13 DIAGNOSIS — D500 Iron deficiency anemia secondary to blood loss (chronic): Secondary | ICD-10-CM | POA: Diagnosis not present

## 2022-03-22 ENCOUNTER — Encounter (HOSPITAL_COMMUNITY): Payer: Self-pay | Admitting: *Deleted

## 2022-03-22 ENCOUNTER — Emergency Department (HOSPITAL_COMMUNITY)
Admission: EM | Admit: 2022-03-22 | Discharge: 2022-03-23 | Disposition: A | Discharge status: Home / Self Care | Payer: Medicare Other | Attending: Emergency Medicine | Admitting: Emergency Medicine

## 2022-03-22 ENCOUNTER — Emergency Department (HOSPITAL_COMMUNITY): Payer: Medicare Other

## 2022-03-22 ENCOUNTER — Other Ambulatory Visit: Payer: Self-pay

## 2022-03-22 DIAGNOSIS — I4891 Unspecified atrial fibrillation: Secondary | ICD-10-CM | POA: Diagnosis not present

## 2022-03-22 DIAGNOSIS — Z7982 Long term (current) use of aspirin: Secondary | ICD-10-CM | POA: Insufficient documentation

## 2022-03-22 DIAGNOSIS — Z8543 Personal history of malignant neoplasm of ovary: Secondary | ICD-10-CM | POA: Diagnosis not present

## 2022-03-22 DIAGNOSIS — Z79899 Other long term (current) drug therapy: Secondary | ICD-10-CM | POA: Insufficient documentation

## 2022-03-22 DIAGNOSIS — Z7951 Long term (current) use of inhaled steroids: Secondary | ICD-10-CM | POA: Diagnosis not present

## 2022-03-22 DIAGNOSIS — Z20822 Contact with and (suspected) exposure to covid-19: Secondary | ICD-10-CM | POA: Insufficient documentation

## 2022-03-22 DIAGNOSIS — Z7901 Long term (current) use of anticoagulants: Secondary | ICD-10-CM | POA: Diagnosis not present

## 2022-03-22 DIAGNOSIS — R0602 Shortness of breath: Secondary | ICD-10-CM | POA: Diagnosis present

## 2022-03-22 DIAGNOSIS — R0600 Dyspnea, unspecified: Secondary | ICD-10-CM | POA: Diagnosis not present

## 2022-03-22 DIAGNOSIS — I10 Essential (primary) hypertension: Secondary | ICD-10-CM | POA: Diagnosis not present

## 2022-03-22 DIAGNOSIS — J9811 Atelectasis: Secondary | ICD-10-CM | POA: Diagnosis not present

## 2022-03-22 LAB — BLOOD GAS, VENOUS
Acid-Base Excess: 1.3 mmol/L (ref 0.0–2.0)
Bicarbonate: 25.9 mmol/L (ref 20.0–28.0)
Drawn by: 51519
O2 Content: 21 L/min
O2 Saturation: 61.5 %
Patient temperature: 36.6
pCO2, Ven: 39 mmHg — ABNORMAL LOW (ref 44–60)
pH, Ven: 7.43 (ref 7.25–7.43)
pO2, Ven: 37 mmHg (ref 32–45)

## 2022-03-22 LAB — CBC WITH DIFFERENTIAL/PLATELET
Abs Immature Granulocytes: 0.03 10*3/uL (ref 0.00–0.07)
Basophils Absolute: 0 10*3/uL (ref 0.0–0.1)
Basophils Relative: 0 %
Eosinophils Absolute: 0.1 10*3/uL (ref 0.0–0.5)
Eosinophils Relative: 2 %
HCT: 38.5 % (ref 36.0–46.0)
Hemoglobin: 13.4 g/dL (ref 12.0–15.0)
Immature Granulocytes: 0 %
Lymphocytes Relative: 28 %
Lymphs Abs: 2.2 10*3/uL (ref 0.7–4.0)
MCH: 33.7 pg (ref 26.0–34.0)
MCHC: 34.8 g/dL (ref 30.0–36.0)
MCV: 96.7 fL (ref 80.0–100.0)
Monocytes Absolute: 0.9 10*3/uL (ref 0.1–1.0)
Monocytes Relative: 11 %
Neutro Abs: 4.8 10*3/uL (ref 1.7–7.7)
Neutrophils Relative %: 59 %
Platelets: 332 10*3/uL (ref 150–400)
RBC: 3.98 MIL/uL (ref 3.87–5.11)
RDW: 12.6 % (ref 11.5–15.5)
WBC: 8.1 10*3/uL (ref 4.0–10.5)
nRBC: 0 % (ref 0.0–0.2)

## 2022-03-22 LAB — BRAIN NATRIURETIC PEPTIDE: B Natriuretic Peptide: 542 pg/mL — ABNORMAL HIGH (ref 0.0–100.0)

## 2022-03-22 LAB — COMPREHENSIVE METABOLIC PANEL
ALT: 20 U/L (ref 0–44)
AST: 24 U/L (ref 15–41)
Albumin: 4.3 g/dL (ref 3.5–5.0)
Alkaline Phosphatase: 63 U/L (ref 38–126)
Anion gap: 8 (ref 5–15)
BUN: 20 mg/dL (ref 8–23)
CO2: 24 mmol/L (ref 22–32)
Calcium: 9.6 mg/dL (ref 8.9–10.3)
Chloride: 106 mmol/L (ref 98–111)
Creatinine, Ser: 0.78 mg/dL (ref 0.44–1.00)
GFR, Estimated: >60 mL/min (ref >60)
Glucose, Bld: 102 mg/dL — ABNORMAL HIGH (ref 70–99)
Potassium: 3.6 mmol/L (ref 3.5–5.1)
Sodium: 138 mmol/L (ref 135–145)
Total Bilirubin: 0.6 mg/dL (ref 0.3–1.2)
Total Protein: 7.6 g/dL (ref 6.5–8.1)

## 2022-03-22 LAB — LACTIC ACID, PLASMA: Lactic Acid, Venous: 0.8 mmol/L (ref 0.5–1.9)

## 2022-03-22 LAB — MAGNESIUM: Magnesium: 2.1 mg/dL (ref 1.7–2.4)

## 2022-03-22 LAB — D-DIMER, QUANTITATIVE: D-Dimer, Quant: 0.51 ug/mL-FEU — ABNORMAL HIGH (ref 0.00–0.50)

## 2022-03-22 LAB — TROPONIN I (HIGH SENSITIVITY): Troponin I (High Sensitivity): 25 ng/L — ABNORMAL HIGH (ref <18)

## 2022-03-22 MED ORDER — METOPROLOL TARTRATE 5 MG/5ML IV SOLN
2.5000 mg | INTRAVENOUS | Status: AC | PRN
  Administered 2022-03-22 (×3): 2.5 mg via INTRAVENOUS
  Filled 2022-03-22 (×2): qty 5

## 2022-03-22 NOTE — ED Notes (Signed)
Pt ambulated to restroom at  this time w/o assistance.

## 2022-03-22 NOTE — ED Provider Notes (Signed)
[facility] EMERGENCY DEPARTMENT Provider Note   CSN: [id number] Arrival date & time: 03/22/22  2039     History {Add pertinent medical, surgical, social history, OB history to HPI:1} Chief Complaint  Patient presents with   Shortness of Breath    April Guerra is a 76 y.o. 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   Pt with SOB and feels like her heart is racing, a/w diaphoresis, sxs started 3 days ago. Endorses associated "achey" feeling in her left upper chest that started today. Had cardizem to take if her HR is above 100 bpm, which she took on Thursday. Today she noted BP 79/60 at home. Denies fever, nausea/vomiting, abdominal pain. +Generalized weakness. Denies syncope.   Patient is in remission from her ovarian cancer and is currently being followed by oncology with f/u in 6 months. Is not currently taking any chemotherapy.    Shortness of Breath      Home Medications Prior to Admission medications   Medication Sig Start Date End Date Taking? Authorizing Provider  acetaminophen (TYLENOL) 500 MG tablet Take 1,000 mg by mouth 3 (three) times daily.    [provider]  albuterol (VENTOLIN HFA) 108 (90 Base) MCG/ACT inhaler Inhale 2 puffs into the lungs every 4 (four) hours as needed for wheezing or shortness of breath. 05/09/20   Roxan Hockey, MD  amLODipine (NORVASC) 10 MG tablet Take 1 tablet (10 mg total) by mouth daily with lunch. For BP 05/09/20   Roxan Hockey, MD  aspirin EC 81 MG tablet Take 81 mg by mouth daily. Swallow whole.    [provider]  b complex vitamins capsule Take 1 capsule by mouth at bedtime.    [provider]  Calcium Carb-Cholecalciferol (CALCIUM 600/VITAMIN D PO) Take 600 mg by mouth in the morning and at bedtime.    [provider]   cetirizine (ZYRTEC) 10 MG tablet Take 10 mg by mouth daily.    [provider]  Cholecalciferol (VITAMIN D) 125 MCG (5000 UT) CAPS Take 5,000 Units by mouth daily.    [provider]  Coenzyme Q10 (COQ10) 100 MG CAPS Take 100 mg by mouth at bedtime.     [provider]  cyclobenzaprine (FLEXERIL) 10 MG tablet Take 5 mg by mouth See admin instructions. Take 5 mg at night, may take a second 5 mg dose during the day as needed for muscle spasms 04/22/19   [provider]  diltiazem (CARDIZEM) 30 MG tablet Take 1 tablet every 4 hours AS NEEDED for AFIB heart rate >100 01/22/22   Arnoldo Lenis, MD  fluticasone (FLONASE) 50 MCG/ACT nasal spray Place 1 spray into both nostrils daily.  11/11/19   [provider]  gabapentin (NEURONTIN) 300 MG capsule Take 300 mg by mouth 3 (three) times daily.    [provider]  lisinopril (ZESTRIL) 30 MG tablet Take 1 tablet (30 mg total) by mouth in the morning. Okay to restart around Monday, 05/14/2020 if blood pressure stable Patient taking differently: Take 30 mg by mouth in the morning. 05/14/20   Roxan Hockey, MD  Magnesium 100 MG TABS Take 50 mg by mouth at bedtime.    [provider]  melatonin 5 MG TABS Take 2.5 mg by mouth at bedtime as needed (Sleep).    [provider]  metoprolol succinate (TOPROL-XL) 100 MG  24 hr tablet Take 100 mg by mouth in the morning. Take with or immediately following a meal.    [provider]  Multiple Vitamins-Minerals (CENTRUM SILVER ADULT 50+ PO) Take 1 tablet by mouth daily.    [provider]  Omega-3 Fatty Acids (FISH OIL) 1000 MG CAPS Take 1,000 mg by mouth daily.    [provider]  ondansetron (ZOFRAN) 4 MG tablet Take 1 tablet (4 mg total) by mouth every 6 (six) hours as needed for nausea. 05/09/20   Roxan Hockey, MD  pantoprazole (PROTONIX) 40 MG tablet Take 1 tablet (40 mg total) by mouth daily. 08/07/20 01/23/79   Harvel Quale, MD  polyethylene glycol powder (GLYCOLAX/MIRALAX) 17 GM/SCOOP powder Take 17 g by mouth daily as needed for mild constipation or moderate constipation (With lunch).    [provider]  Polyvinyl Alcohol-Povidone PF (REFRESH) 1.4-0.6 % SOLN Apply 1 drop to eye as needed.    [provider]  simvastatin (ZOCOR) 20 MG tablet Take 20 mg by mouth at bedtime. 05/28/21   [provider]  sodium chloride (OCEAN) 0.65 % SOLN nasal spray Place 1 spray into both nostrils See admin instructions. Use 1 spray in each nostril in the morning may use a second dose at night as needed for congestion    [provider]  triamcinolone (KENALOG) 0.1 % Apply 1 application topically daily as needed (irritation). 05/22/20   [provider]  vitamin C (ASCORBIC ACID) 500 MG tablet Take 1,000 mg by mouth 2 (two) times daily.    [provider]  Vitamins A & D (VITAMIN A & D) ointment Apply 1 application topically daily as needed for dry skin (irritation).    [provider]  prochlorperazine (COMPAZINE) 10 MG tablet Take 1 tablet (10 mg total) by mouth every 6 (six) hours as needed (Nausea or vomiting). 05/29/20 12/14/21  Nicholas Lose, MD      Allergies    Morphine and related and Zofran [ondansetron]    Review of Systems   Review of Systems  Respiratory:  Positive for shortness of breath.    Review of systems positive/negative for ***.  A 10 point review of systems was performed and is negative unless otherwise reported in HPI.  Physical Exam Updated Vital Signs BP (!) 137/100   Pulse (!) 110   Temp 97.9 F (36.6 C)   Resp 17   Ht '4\' 10"'$  (1.473 m)   Wt 77.1 kg   SpO2 98%   BMI 35.53 kg/m  Physical Exam General: Normal appearing ***female/female, lying in bed.  HEENT: PERRLA, Sclera anicteric, MMM, trachea midline. Cardiology: RRR, no murmurs/rubs/gallops. BL radial and DP pulses equal bilaterally.  Resp: Normal  respiratory rate and effort. CTAB, no wheezes, rhonchi, crackles.  Abd: Soft, non-tender, non-distended. No rebound tenderness or guarding.  GU: Deferred. MSK: No peripheral edema or signs of trauma. Extremities without deformity or TTP. No cyanosis or clubbing. Skin: warm, dry. No rashes or lesions. Back: No CVA tenderness Neuro: A&Ox4, CNs II-XII grossly intact. MAEs. Sensation grossly intact.  Psych: Normal mood and affect.   ED Results / Procedures / Treatments   Labs (all labs ordered are listed, but only abnormal results are displayed) Labs Reviewed - No data to display  EKG EKG Interpretation  Date/Time:  Saturday March 22 2022 21:03:51 EDT Ventricular Rate:  131 PR Interval:    QRS Duration: 86 QT Interval:  317 QTC Calculation: 468 R Axis:   17 Text  Interpretation: Atrial fibrillation with RVR Minimal ST depression, diffuse leads Confirmed by Cindee Lame [phone]) on 03/22/2022 9:51:30 PM  Radiology DG Chest Portable 1 View  Result Date: 03/22/2022 CLINICAL DATA:  Dyspnea and racing heart EXAM: PORTABLE CHEST 1 VIEW COMPARISON:  Radiographs 12/20/2013 FINDINGS: Mild cardiomegaly. Right chest wall Port-A-Cath tip in the mid SVC. Bibasilar atelectasis. No pleural effusion or pneumothorax. No acute osseous abnormality. IMPRESSION: No acute abnormality.  Mild cardiomegaly. Electronically Signed   By: Placido Sou M.D.   On: 03/22/2022 21:40    Procedures Procedures  {Document cardiac monitor, telemetry assessment procedure when appropriate:1}  Medications Ordered in ED Medications - No data to display  ED Course/ Medical Decision Making/ A&P                          Medical Decision Making Amount and/or Complexity of Data Reviewed Labs: ordered. Radiology: ordered. Decision-making details documented in ED Course.  Risk Prescription drug management.    Patient is overall very well-appearing, no increased WOB, no hypoxia on RA. Patient's HR irregular  110-140s bpm.   DDX for dyspnea includes but is not limited to: Cardiac- CHF, Myocardial Ischemia, Valvular heart disease, Arrythmia, Cardiac tamponade  Respiratory - Pneumonia / atelectasis / pulmonary effusion / cavitary lung disease, Pneumothorax, COPD/ reactive airway disease, PE, ARDS  Other - Sepsis, Anemia      I have personally reviewed and interpreted all labs and imaging.   Clinical Course as of 03/22/22 2151  Sat Mar 22, 2022  2147 DG Chest Portable 1 View FINDINGS: Mild cardiomegaly. Right chest wall Port-A-Cath tip in the mid SVC. Bibasilar atelectasis. No pleural effusion or pneumothorax. No acute osseous abnormality.  IMPRESSION: No acute abnormality.  Mild cardiomegaly. [HN]    Clinical Course User Index [HN] Audley Hose, MD    {Document critical care time when appropriate:1} {Document review of labs and clinical decision tools ie heart score, Chads2Vasc2 etc:1}  {Document your independent review of radiology images, and any outside records:1} {Document your discussion with family members, caretakers, and with consultants:1} {Document social determinants of health affecting pt's care:1} {Document your decision making why or why not admission, treatments were needed:1} Final Clinical Impression(s) / ED Diagnoses Final diagnoses:  None    Rx / DC Orders ED Discharge Orders     None        This note was created using dictation software, which may contain spelling or grammatical errors.

## 2022-03-22 NOTE — ED Triage Notes (Signed)
Pt with sob and feels like her heart is racing.  Noted BP 79/60 at home.  Pt with diaphoresis at home. Denies cough

## 2022-03-23 LAB — TROPONIN I (HIGH SENSITIVITY)
Troponin I (High Sensitivity): 24 ng/L — ABNORMAL HIGH (ref <18)
Troponin I (High Sensitivity): 23 ng/L — ABNORMAL HIGH (ref <18)

## 2022-03-23 LAB — SARS CORONAVIRUS 2 BY RT PCR: SARS Coronavirus 2 by RT PCR: NEGATIVE

## 2022-03-23 LAB — URINALYSIS, ROUTINE W REFLEX MICROSCOPIC
Bilirubin Urine: NEGATIVE
Glucose, UA: NEGATIVE mg/dL
Hgb urine dipstick: NEGATIVE
Ketones, ur: NEGATIVE mg/dL
Leukocytes,Ua: NEGATIVE
Nitrite: NEGATIVE
Protein, ur: NEGATIVE mg/dL
Specific Gravity, Urine: 1.005 (ref 1.005–1.030)
pH: 7 (ref 5.0–8.0)

## 2022-03-23 MED ORDER — DILTIAZEM HCL-DEXTROSE 125-5 MG/125ML-% IV SOLN (PREMIX)
5.0000 mg/h | INTRAVENOUS | Status: DC
  Administered 2022-03-23: 5 mg/h via INTRAVENOUS
  Filled 2022-03-23: qty 125

## 2022-03-23 MED ORDER — APIXABAN 5 MG PO TABS
5.0000 mg | ORAL_TABLET | Freq: Once | ORAL | Status: AC
  Administered 2022-03-23: 5 mg via ORAL
  Filled 2022-03-23: qty 1

## 2022-03-23 MED ORDER — DILTIAZEM LOAD VIA INFUSION
10.0000 mg | Freq: Once | INTRAVENOUS | Status: AC
  Administered 2022-03-23: 10 mg via INTRAVENOUS
  Filled 2022-03-23: qty 10

## 2022-03-23 MED ORDER — HEPARIN SOD (PORK) LOCK FLUSH 100 UNIT/ML IV SOLN
500.0000 [IU] | Freq: Once | INTRAVENOUS | Status: AC
  Administered 2022-03-23: 500 [IU] via INTRAVENOUS
  Filled 2022-03-23: qty 5

## 2022-03-23 MED ORDER — METOPROLOL TARTRATE 5 MG/5ML IV SOLN
2.5000 mg | INTRAVENOUS | Status: AC | PRN
  Administered 2022-03-23 (×3): 2.5 mg via INTRAVENOUS
  Filled 2022-03-23 (×2): qty 5

## 2022-03-23 NOTE — ED Provider Notes (Signed)
Physical Exam  BP 121/61   Pulse 83   Temp 98 F (36.7 C) (Oral)   Resp 20   Ht '4\' 10"'$  (1.473 m)   Wt 77.1 kg   SpO2 92%   BMI 35.53 kg/m   Physical Exam Vitals and nursing note reviewed.  Constitutional:      General: She is not in acute distress.    Appearance: She is well-developed. She is not diaphoretic.  HENT:     Head: Normocephalic and atraumatic.  Cardiovascular:     Rate and Rhythm: Normal rate. Rhythm irregular.     Heart sounds: No murmur heard.    No friction rub. No gallop.  Pulmonary:     Effort: Pulmonary effort is normal. No respiratory distress.     Breath sounds: Normal breath sounds. No wheezing.  Abdominal:     General: Bowel sounds are normal. There is no distension.     Palpations: Abdomen is soft.     Tenderness: There is no abdominal tenderness.  Musculoskeletal:        General: Normal range of motion.     Cervical back: Normal range of motion and neck supple.  Skin:    General: Skin is warm and dry.  Neurological:     General: No focal deficit present.     Mental Status: She is alert and oriented to person, place, and time.     Procedures  Procedures  ED Course / MDM   Clinical Course as of 03/23/22 0341  Sat Mar 22, 2022  2147 DG Chest Portable 1 View FINDINGS: Mild cardiomegaly. Right chest wall Port-A-Cath tip in the mid SVC. Bibasilar atelectasis. No pleural effusion or pneumothorax. No acute osseous abnormality.  IMPRESSION: No acute abnormality.  Mild cardiomegaly. [HN]  2335 Troponin I (High Sensitivity)(!): 25 [HN]  2335 B Natriuretic Peptide(!): 542.0 [HN]  2335 DG Chest Portable 1 View No acute abnormality.  Mild cardiomegaly. [HN]  Sun Mar 23, 2022  0000 3 doses of 2.5 mg IV metoprolol without success. BP has been stable. Will give another 3 doses of 2.5 mg IV metoprolol and reevaluate. [HN]    Clinical Course User Index [HN] Audley Hose, MD   Care assumed from Dr. Mayra Neer at shift change.  Patient presenting  here with shortness of breath.  She has history of atrial fibrillation, but has not had an episode in several years.  She was found to be in atrial fibrillation with RVR.  Patient was given doses of Lopressor, however did not convert.  Care signed out to me awaiting reassessment and results of laboratory studies.  Laboratory studies have resulted and show only mild, but stable elevation of troponin.  Her BNP is in the 500s, the significance of which I am uncertain, but she does not appear to be in heart failure.  Her lungs are clear and chest x-ray shows no acute abnormality.  Patient was then started on a Cardizem drip and this achieved good rate control.  Her rate is now in the 70s and 80s and has gone in and out of A-fib and sinus rhythm.  She has been on Eliquis in the past, but had some GI bleeding at one point and this was stopped.  She has been informed that she is cleared to restart the Eliquis by her GI doctor and I will advise her to begin taking this medication again.  She was given a dose here and has some left at home.  She also has Cardizem  at home that she can take if her heart rate is running high.  I have advised to take this as directed and follow-up with her cardiologist this week.   CRITICAL CARE Performed by: Veryl Speak Total critical care time: 35 minutes Critical care time was exclusive of separately billable procedures and treating other patients. Critical care was necessary to treat or prevent imminent or life-threatening deterioration. Critical care was time spent personally by me on the following activities: development of treatment plan with patient and/or surrogate as well as nursing, discussions with consultants, evaluation of patient's response to treatment, examination of patient, obtaining history from patient or surrogate, ordering and performing treatments and interventions, ordering and review of laboratory studies, ordering and review of radiographic studies,  pulse oximetry and re-evaluation of patient's condition.      Veryl Speak, MD 03/23/22 [phone]

## 2022-03-23 NOTE — ED Notes (Signed)
ED Provider at bedside. 

## 2022-03-23 NOTE — Discharge Instructions (Signed)
Resume taking your Eliquis 5 mg twice daily.  Continue taking Cardizem as needed for elevated heart rate.  Follow-up with your cardiologist in the next few days, and return to the ER if symptoms significantly worsen or change.

## 2022-03-24 ENCOUNTER — Telehealth: Payer: Self-pay | Admitting: Cardiology

## 2022-03-24 ENCOUNTER — Other Ambulatory Visit: Payer: Self-pay | Admitting: *Deleted

## 2022-03-24 ENCOUNTER — Encounter: Payer: Self-pay | Admitting: Cardiology

## 2022-03-24 ENCOUNTER — Ambulatory Visit: Payer: Medicare Other | Attending: Cardiology | Admitting: Cardiology

## 2022-03-24 VITALS — BP 135/75 | HR 61 | Ht <= 58 in | Wt 173.0 lb

## 2022-03-24 DIAGNOSIS — R600 Localized edema: Secondary | ICD-10-CM | POA: Diagnosis not present

## 2022-03-24 DIAGNOSIS — I4891 Unspecified atrial fibrillation: Secondary | ICD-10-CM | POA: Diagnosis not present

## 2022-03-24 MED ORDER — FUROSEMIDE 20 MG PO TABS: 20.0000 mg | ORAL_TABLET | Freq: Every day | ORAL | 3 refills | Status: DC | PRN

## 2022-03-24 MED ORDER — APIXABAN 5 MG PO TABS: 5.0000 mg | ORAL_TABLET | Freq: Two times a day (BID) | ORAL | 1 refills | Status: DC

## 2022-03-24 NOTE — Telephone Encounter (Signed)
Pt was told to f/u with Dr. Harl Bowie after hospital visit. Informed her there are no appt available at this time, pt does not want to see APP. Placed her on waitlist.

## 2022-03-24 NOTE — Patient Instructions (Signed)
Medication Instructions:  Your physician has recommended you make the following change in your medication:  -Start Lasix 20 mg tablets daily as needed for swelling   Labwork: None  Testing/Procedures: None  Follow-Up: Keep scheduled follow up appointment with Dr. Harl Bowie in February 2024.  Any Other Special Instructions Will Be Listed Below (If Applicable).     If you need a refill on your cardiac medications before your next appointment, please call your pharmacy.

## 2022-03-24 NOTE — Telephone Encounter (Signed)
Reports ED visit for a-fib. Says she is still in a-fib and some SOB. Took diltiazem 30 mg this morning for HR 112. Denies chest pain or dizziness. Gave available appointment to see [facility] today at 3 pm at the [place] office. Verbalized understanding.

## 2022-03-24 NOTE — Progress Notes (Signed)
Clinical Summary April Guerra is a 76 y.o.female seen today as a new consult. Previously seen in [facility] in 2019  Focused visit on issues with recurrent afib   Afib - history of prior DCCV 05/01/2018 in setting of newly diagnosed afib, deemed to be of recent onset within hours to presentation - ER visit 04/2019 recurrent afib   - had been on eliquis for about 3 years, stopped in 2021.  - reports bleeding ulcer after surgery. Isolated bleeding.    04/2020 non bleeding ulcer, erythema 07/2020: normal stomach  03/22/22 ER visit with afib RVR - started on cardizem gtt, from note was paroxysmal then with rate control   - reports high stress.  - can feel achy, sweaty, some palpitations - checked HR felt and was elevated, took cardizem - takes metoprol '100mg'$  daily, diltiazem diltiazem.  - back on eliquis '5mg'$  bid.      2. LE edema - occasional edema - BNP during ER visit with afib with RVR was 500    Other medical issues not addressed    2.HTN - home bp's 130s/60s-70s - frequent urination, would avoid diuretic      3. Hyperlipidemia   - she is on simvastatin, reports dose lowered to '20mg'$  daily about 4 months ago. Symptoms no better.  - history of neuropathy, carpal tunnel    Former [employer].      4. History    5. LE edema -    She reports normal cath 9628, complicated by pseudoaneurysm required a surgery Past Medical History:  Diagnosis Date   A-fib ([place])    Anxiety    Arthritis    Asthma    hx of    Cancer ([place])    ovarian   Dyspnea    with exertion    H/O seasonal allergies    Heart murmur    as aa child    Hypercholesteremia    Hypertension    Spinal stenosis      Allergies  Allergen Reactions   Morphine And Related Nausea And Vomiting   Zofran [Ondansetron]     Causes minor constipation, tolerates low doses       Current Outpatient Medications  Medication Sig Dispense Refill   acetaminophen (TYLENOL) 500 MG tablet Take 1,000 mg  by mouth 3 (three) times daily.     albuterol (VENTOLIN HFA) 108 (90 Base) MCG/ACT inhaler Inhale 2 puffs into the lungs every 4 (four) hours as needed for wheezing or shortness of breath. 18 g 2   amLODipine (NORVASC) 10 MG tablet Take 1 tablet (10 mg total) by mouth daily with lunch. For BP 30 tablet 3   apixaban (ELIQUIS) 5 MG TABS tablet Take 1 tablet by mouth 2 (two) times daily.     b complex vitamins capsule Take 1 capsule by mouth at bedtime.     Calcium Carb-Cholecalciferol (CALCIUM 600/VITAMIN D PO) Take 600 mg by mouth in the morning and at bedtime.     cetirizine (ZYRTEC) 10 MG tablet Take 10 mg by mouth daily.     Cholecalciferol (VITAMIN D) 125 MCG (5000 UT) CAPS Take 5,000 Units by mouth daily.     Coenzyme Q10 (COQ10) 100 MG CAPS Take 100 mg by mouth at bedtime.      cyclobenzaprine (FLEXERIL) 10 MG tablet Take 5 mg by mouth See admin instructions. Take 5 mg at night, may take a second 5 mg dose during the day as needed for muscle spasms  diltiazem (CARDIZEM) 30 MG tablet Take 1 tablet every 4 hours AS NEEDED for AFIB heart rate >100 30 tablet 3   fluticasone (FLONASE) 50 MCG/ACT nasal spray Place 1 spray into both nostrils daily.      furosemide (LASIX) 20 MG tablet Take 1 tablet (20 mg total) by mouth daily as needed (Swelling). 30 tablet 3   gabapentin (NEURONTIN) 300 MG capsule Take 300 mg by mouth 3 (three) times daily.     lisinopril (ZESTRIL) 30 MG tablet Take 1 tablet (30 mg total) by mouth in the morning. Okay to restart around Monday, 05/14/2020 if blood pressure stable (Patient taking differently: Take 30 mg by mouth in the morning.) 30 tablet 3   Magnesium 100 MG TABS Take 50 mg by mouth at bedtime.     melatonin 5 MG TABS Take 2.5 mg by mouth at bedtime as needed (Sleep).     metoprolol succinate (TOPROL-XL) 100 MG 24 hr tablet Take 100 mg by mouth in the morning. Take with or immediately following a meal.     Multiple Vitamins-Minerals (CENTRUM SILVER ADULT 50+ PO)  Take 1 tablet by mouth daily.     Omega-3 Fatty Acids (FISH OIL) 1000 MG CAPS Take 1,000 mg by mouth daily.     ondansetron (ZOFRAN) 4 MG tablet Take 1 tablet (4 mg total) by mouth every 6 (six) hours as needed for nausea. 20 tablet 0   pantoprazole (PROTONIX) 40 MG tablet Take 1 tablet (40 mg total) by mouth daily. 90 tablet 3   polyethylene glycol powder (GLYCOLAX/MIRALAX) 17 GM/SCOOP powder Take 17 g by mouth daily as needed for mild constipation or moderate constipation (With lunch).     Polyvinyl Alcohol-Povidone PF (REFRESH) 1.4-0.6 % SOLN Apply 1 drop to eye as needed.     simvastatin (ZOCOR) 20 MG tablet Take 20 mg by mouth at bedtime.     sodium chloride (OCEAN) 0.65 % SOLN nasal spray Place 1 spray into both nostrils See admin instructions. Use 1 spray in each nostril in the morning may use a second dose at night as needed for congestion     triamcinolone (KENALOG) 0.1 % Apply 1 application topically daily as needed (irritation).     vitamin C (ASCORBIC ACID) 500 MG tablet Take 1,000 mg by mouth 2 (two) times daily.     Vitamins A & D (VITAMIN A & D) ointment Apply 1 application topically daily as needed for dry skin (irritation).     No current facility-administered medications for this visit.     Past Surgical History:  Procedure Laterality Date   ABDOMINAL HYSTERECTOMY     BIOPSY  05/08/2020   Procedure: BIOPSY;  Surgeon: Harvel Quale, MD;  [facility]: [facility];  Service: Gastroenterology;;   BREAST SURGERY     CARDIAC CATHETERIZATION     CHOLECYSTECTOMY     COLONOSCOPY N/A 08/14/2015   Procedure: COLONOSCOPY;  Surgeon: Aviva Signs, MD;  [facility]: [facility];  Service: Gastroenterology;  Laterality: N/A;   ESOPHAGOGASTRODUODENOSCOPY (EGD) WITH PROPOFOL N/A 05/08/2020   Procedure: ESOPHAGOGASTRODUODENOSCOPY (EGD) WITH PROPOFOL;  Surgeon: Harvel Quale, MD;  [facility];  Service: Gastroenterology;  Laterality: N/A;    ESOPHAGOGASTRODUODENOSCOPY (EGD) WITH PROPOFOL N/A 08/07/2020   Procedure: ESOPHAGOGASTRODUODENOSCOPY (EGD) WITH PROPOFOL;  Surgeon: Harvel Quale, MD;  [facility];  Service: Gastroenterology;  Laterality: N/A;  7:30   IR IMAGING GUIDED PORT INSERTION  06/04/2020   ROBOTIC ASSISTED TOTAL HYSTERECTOMY WITH BILATERAL SALPINGO  OOPHERECTOMY N/A 05/03/2020   Procedure: XI ROBOTIC ASSISTED TOTAL HYSTERECTOMY WITH BILATERAL SALPINGO OOPHORECTOMY, OMENTECTOMY,RADICAL TUMOR DEBULKING, RIGID PROSTOSCOPY;  Surgeon: Everitt Amber, MD;  Location: [facility];  Service: Gynecology;  Laterality: N/A;   THROMBECTOMY FEMORAL ARTERY Right 12/17/2013   Procedure: REPAIR OF RIGHT FEMORAL ARTERY PSEUDOANEURYSM;  Surgeon: Elam Dutch, MD;  Location: [facility];  Service: Vascular;  Laterality: Right;     Allergies  Allergen Reactions   Morphine And Related Nausea And Vomiting   Zofran [Ondansetron]     Causes minor constipation, tolerates low doses        Family History  Problem Relation Age of Onset   Hypertension Mother    Heart failure Mother    Asthma Mother    Ulcers Mother    Benign prostatic hyperplasia Father    Ulcers Father    Esophageal varices Father    Atrial fibrillation Sister    Atrial fibrillation Brother    Heart disease Brother    Melanoma Maternal Aunt    Colon cancer Neg Hx    Breast cancer Neg Hx    Ovarian cancer Neg Hx    Endometrial cancer Neg Hx    Pancreatic cancer Neg Hx    Prostate cancer Neg Hx      Social History Ms. Tremont reports that she has never smoked. She has never used smokeless tobacco. Ms. Tabbert reports no history of alcohol use.   Review of Systems CONSTITUTIONAL: No weight loss, fever, chills, weakness or fatigue.  HEENT: Eyes: No visual loss, blurred vision, double vision or yellow sclerae.No hearing loss, sneezing, congestion, runny nose or sore throat.  SKIN: No rash or itching.  CARDIOVASCULAR: per hpi RESPIRATORY: No shortness  of breath, cough or sputum.  GASTROINTESTINAL: No anorexia, nausea, vomiting or diarrhea. No abdominal pain or blood.  GENITOURINARY: No burning on urination, no polyuria NEUROLOGICAL: No headache, dizziness, syncope, paralysis, ataxia, numbness or tingling in the extremities. No change in bowel or bladder control.  MUSCULOSKELETAL: No muscle, back pain, joint pain or stiffness.  LYMPHATICS: No enlarged nodes. No history of splenectomy.  PSYCHIATRIC: No history of depression or anxiety.  ENDOCRINOLOGIC: No reports of sweating, cold or heat intolerance. No polyuria or polydipsia.  Marland Kitchen   Physical Examination Today's Vitals   03/24/22 1510 03/24/22 1546  BP: (!) 150/108 135/75  Pulse: 61   SpO2: 97%   Weight: 173 lb (78.5 kg)   Height: '4\' 10"'$  (1.473 m)    Body mass index is 36.16 kg/m.  Gen: resting comfortably, no acute distress HEENT: no scleral icterus, pupils equal round and reactive, no palptable cervical adenopathy,  CV: RRR, no m/rg no jvd Resp: Clear to auscultation bilaterally GI: abdomen is soft, non-tender, non-distended, normal bowel sounds, no hepatosplenomegaly MSK: extremities are warm, no edema.  Skin: warm, no rash Neuro:  no focal deficits Psych: appropriate affect   Diagnostic Studies  04/2018 echo   Study Conclusions   - Left ventricle: The cavity size was normal. Wall thickness was    increased in a pattern of moderate LVH. Systolic function was    normal. The estimated ejection fraction was in the range of 60%    to 65%. Wall motion was normal; there were no regional wall    motion abnormalities. Left ventricular diastolic function    parameters were normal.  - Mitral valve: Mildly thickened leaflets . There was mild    regurgitation.  - Left atrium: The atrium was mildly dilated.  - Right  atrium: The atrium was at the upper limits of normal in    size.  - Inferior vena cava: The vessel was normal in size. The    respirophasic diameter changes were  in the normal range (>= 50%),    consistent with normal central venous pressure.    Assessment and Plan   1.PAF - infrequent episodes over the last 4 years, recent ER visits. Had been 3 years since last recurrence.  - EKG today back in [facility] - continue toprol '100mg'$  daily and prn diltiazem for now. If increased frequent would d/c norvasc and start diltiazem '30mg'$  bid, would need to monitor HRs that could tolerate.  - restarted eliquis, was cleared by GI  2. LE edema - start lasix '20mg'$  prn     Arnoldo Lenis, M.D.

## 2022-03-27 DIAGNOSIS — I4891 Unspecified atrial fibrillation: Secondary | ICD-10-CM | POA: Diagnosis not present

## 2022-03-27 DIAGNOSIS — E7849 Other hyperlipidemia: Secondary | ICD-10-CM | POA: Diagnosis not present

## 2022-03-27 DIAGNOSIS — G629 Polyneuropathy, unspecified: Secondary | ICD-10-CM | POA: Diagnosis not present

## 2022-03-27 DIAGNOSIS — K2100 Gastro-esophageal reflux disease with esophagitis, without bleeding: Secondary | ICD-10-CM | POA: Diagnosis not present

## 2022-03-28 ENCOUNTER — Telehealth: Payer: Self-pay | Admitting: *Deleted

## 2022-03-28 NOTE — Telephone Encounter (Signed)
April Guerra penned        Patient  visited [facility] on 03/23/2022  for afib   Telephone encounter attempt :  1st  A HIPAA compliant voice message was left requesting a return call.  Instructed patient to call back at [phone].  [facility] [phone] , [place] [address] Email : [email or web address]

## 2022-04-08 ENCOUNTER — Telehealth: Payer: Self-pay | Admitting: *Deleted

## 2022-04-08 NOTE — Telephone Encounter (Signed)
   Patient  visited [facility] on 03/23/2022  for afib   Have you been able to follow up with your primary care physician? Yes patient has completed follow up  The patient was able to obtain any needed medicine or equipment.  Are there diet recommendations that you are having difficulty following?  Patient expresses understanding of discharge instructions and education provided has no other needs at this time.    [facility] [phone] [address] , [place] [phone] Email : [email or web address]

## 2022-04-22 DIAGNOSIS — I4891 Unspecified atrial fibrillation: Secondary | ICD-10-CM | POA: Diagnosis not present

## 2022-04-22 DIAGNOSIS — I10 Essential (primary) hypertension: Secondary | ICD-10-CM | POA: Diagnosis not present

## 2022-04-22 DIAGNOSIS — E782 Mixed hyperlipidemia: Secondary | ICD-10-CM | POA: Diagnosis not present

## 2022-04-28 DIAGNOSIS — H6591 Unspecified nonsuppurative otitis media, right ear: Secondary | ICD-10-CM | POA: Diagnosis not present

## 2022-05-01 ENCOUNTER — Other Ambulatory Visit (HOSPITAL_COMMUNITY): Payer: Self-pay | Admitting: Family Medicine

## 2022-05-01 DIAGNOSIS — Z1231 Encounter for screening mammogram for malignant neoplasm of breast: Secondary | ICD-10-CM

## 2022-05-16 ENCOUNTER — Ambulatory Visit (HOSPITAL_COMMUNITY)
Admission: RE | Admit: 2022-05-16 | Discharge: 2022-05-16 | Disposition: A | Discharge status: Home / Self Care | Payer: Medicare Other | Source: Ambulatory Visit | Attending: Family Medicine | Admitting: Family Medicine

## 2022-05-16 ENCOUNTER — Encounter (HOSPITAL_COMMUNITY): Payer: Self-pay

## 2022-05-16 DIAGNOSIS — Z1231 Encounter for screening mammogram for malignant neoplasm of breast: Secondary | ICD-10-CM | POA: Diagnosis not present

## 2022-05-21 ENCOUNTER — Inpatient Hospital Stay: Payer: Medicare Other

## 2022-05-21 ENCOUNTER — Inpatient Hospital Stay: Payer: Medicare Other | Attending: Hematology

## 2022-05-21 VITALS — BP 166/79 | HR 66 | Temp 97.9°F | Resp 20

## 2022-05-21 DIAGNOSIS — C561 Malignant neoplasm of right ovary: Secondary | ICD-10-CM | POA: Diagnosis present

## 2022-05-21 DIAGNOSIS — Z452 Encounter for adjustment and management of vascular access device: Secondary | ICD-10-CM | POA: Diagnosis not present

## 2022-05-21 MED ORDER — HEPARIN SOD (PORK) LOCK FLUSH 100 UNIT/ML IV SOLN
500.0000 [IU] | Freq: Once | INTRAVENOUS | Status: AC
  Administered 2022-05-21: 500 [IU] via INTRAVENOUS

## 2022-05-21 MED ORDER — SODIUM CHLORIDE 0.9% FLUSH
10.0000 mL | Freq: Once | INTRAVENOUS | Status: AC
  Administered 2022-05-21: 10 mL via INTRAVENOUS

## 2022-05-21 NOTE — Progress Notes (Signed)
Patients port flushed without difficulty.  Good blood return noted with no bruising or swelling noted at site.  Band aid applied.  VSS with discharge and left in satisfactory condition with no s/s of distress noted.   

## 2022-05-21 NOTE — Patient Instructions (Signed)
[facility]  Discharge Instructions: Thank you for choosing [facility] to provide your oncology and hematology care.  If you have a lab appointment with the Cancer Center, please come in thru the Main Entrance and check in at the main information desk.  Wear comfortable clothing and clothing appropriate for easy access to any Portacath or PICC line.   We strive to give you quality time with your provider. You may need to reschedule your appointment if you arrive late (15 or more minutes).  Arriving late affects you and other patients whose appointments are after yours.  Also, if you miss three or more appointments without notifying the office, you may be dismissed from the clinic at the provider's discretion.      For prescription refill requests, have your pharmacy contact our office and allow 72 hours for refills to be completed.     To help prevent nausea and vomiting after your treatment, we encourage you to take your nausea medication as directed.  BELOW ARE SYMPTOMS THAT SHOULD BE REPORTED IMMEDIATELY: *FEVER GREATER THAN 100.4 F (38 C) OR HIGHER *CHILLS OR SWEATING *NAUSEA AND VOMITING THAT IS NOT CONTROLLED WITH YOUR NAUSEA MEDICATION *UNUSUAL SHORTNESS OF BREATH *UNUSUAL BRUISING OR BLEEDING *URINARY PROBLEMS (pain or burning when urinating, or frequent urination) *BOWEL PROBLEMS (unusual diarrhea, constipation, pain near the anus) TENDERNESS IN MOUTH AND THROAT WITH OR WITHOUT PRESENCE OF ULCERS (sore throat, sores in mouth, or a toothache) UNUSUAL RASH, SWELLING OR PAIN  UNUSUAL VAGINAL DISCHARGE OR ITCHING   Items with * indicate a potential emergency and should be followed up as soon as possible or go to the Emergency Department if any problems should occur.  Please show the CHEMOTHERAPY ALERT CARD or IMMUNOTHERAPY ALERT CARD at check-in to the Emergency Department and triage nurse.  Should you have questions after your visit or need to  cancel or reschedule your appointment, please contact [facility] [phone]  and follow the prompts.  Office hours are 8:00 a.m. to 4:30 p.m. Monday - Friday. Please note that voicemails left after 4:00 p.m. may not be returned until the following business day.  We are closed weekends and major holidays. You have access to a nurse at all times for urgent questions. Please call the main number to the clinic [phone] and follow the prompts.  For any non-urgent questions, you may also contact your provider using MyChart. We now offer e-Visits for anyone 18 and older to request care online for non-urgent symptoms. For details visit [email or web address].   Also download the MyChart app! Go to the app store, search "MyChart", open the app, select [facility], and log in with your MyChart username and password.  Masks are optional in the cancer centers. If you would like for your care team to wear a mask while they are taking care of you, please let them know. You may have one support person who is at least 76 years old accompany you for your appointments.  

## 2022-05-22 DIAGNOSIS — E782 Mixed hyperlipidemia: Secondary | ICD-10-CM | POA: Diagnosis not present

## 2022-05-22 DIAGNOSIS — I4891 Unspecified atrial fibrillation: Secondary | ICD-10-CM | POA: Diagnosis not present

## 2022-05-22 DIAGNOSIS — I10 Essential (primary) hypertension: Secondary | ICD-10-CM | POA: Diagnosis not present

## 2022-06-01 ENCOUNTER — Other Ambulatory Visit: Payer: Self-pay | Admitting: Cardiology

## 2022-06-19 DIAGNOSIS — R350 Frequency of micturition: Secondary | ICD-10-CM | POA: Diagnosis not present

## 2022-07-25 ENCOUNTER — Ambulatory Visit: Payer: Medicare Other | Admitting: Cardiology

## 2022-07-25 NOTE — Progress Notes (Deleted)
Clinical Summary April Guerra is a 77 y.o.female  Afib - history of prior DCCV 05/01/2018 in setting of newly diagnosed afib, deemed to be of recent onset within hours to presentation - ER visit 04/2019 recurrent afib   - had been on eliquis for about 3 years, stopped in 2021.  - reports bleeding ulcer after surgery. Isolated bleeding.    04/2020 non bleeding ulcer, erythema 07/2020: normal stomach   03/22/22 ER visit with afib RVR - started on cardizem gtt, from note was paroxysmal then with rate control     - reports high stress.  - can feel achy, sweaty, some palpitations - checked HR felt and was elevated, took cardizem - takes metoprol '100mg'$  daily, diltiazem diltiazem.  - back on eliquis '5mg'$  bid.          2. LE edema - occasional edema - BNP during ER visit with afib with RVR was 500       Other medical issues not addressed       2.HTN - home bp's 130s/60s-70s - frequent urination, would avoid diuretic         3. Hyperlipidemia   - she is on simvastatin, reports dose lowered to '20mg'$  daily about 4 months ago. Symptoms no better.  - history of neuropathy, carpal tunnel    Former Marine scientist.      4. History    5. LE edema -    She reports normal cath [id number], complicated by pseudoaneurysm required a surgery Past Medical History:  Diagnosis Date   A-fib ([place])    Anxiety    Arthritis    Asthma    hx of    Cancer ([place])    ovarian   Dyspnea    with exertion    H/O seasonal allergies    Heart murmur    as aa child    Hypercholesteremia    Hypertension    Spinal stenosis      Allergies  Allergen Reactions   Morphine And Related Nausea And Vomiting   Zofran [Ondansetron]     Causes minor constipation, tolerates low doses       Current Outpatient Medications  Medication Sig Dispense Refill   acetaminophen (TYLENOL) 500 MG tablet Take 1,000 mg by mouth 3 (three) times daily.     albuterol (VENTOLIN HFA) 108 (90 Base) MCG/ACT inhaler Inhale 2  puffs into the lungs every 4 (four) hours as needed for wheezing or shortness of breath. 18 g 2   amLODipine (NORVASC) 10 MG tablet Take 1 tablet (10 mg total) by mouth daily with lunch. For BP 30 tablet 3   apixaban (ELIQUIS) 5 MG TABS tablet Take 1 tablet (5 mg total) by mouth 2 (two) times daily. 180 tablet 1   b complex vitamins capsule Take 1 capsule by mouth at bedtime.     Calcium Carb-Cholecalciferol (CALCIUM 600/VITAMIN D PO) Take 600 mg by mouth in the morning and at bedtime.     cetirizine (ZYRTEC) 10 MG tablet Take 10 mg by mouth daily.     Cholecalciferol (VITAMIN D) 125 MCG (5000 UT) CAPS Take 5,000 Units by mouth daily.     Coenzyme Q10 (COQ10) 100 MG CAPS Take 100 mg by mouth at bedtime.      cyclobenzaprine (FLEXERIL) 10 MG tablet Take 5 mg by mouth See admin instructions. Take 5 mg at night, may take a second 5 mg dose during the day as needed for muscle spasms  diltiazem (CARDIZEM) 30 MG tablet Take 1 tablet every 4 hours AS NEEDED for AFIB heart rate >100 30 tablet 3   fluticasone (FLONASE) 50 MCG/ACT nasal spray Place 1 spray into both nostrils daily.      furosemide (LASIX) 20 MG tablet TAKE 1 TABLET BY MOUTH DAILY AS  NEEDED FOR SWELLING 100 tablet 2   gabapentin (NEURONTIN) 300 MG capsule Take 300 mg by mouth 3 (three) times daily.     lisinopril (ZESTRIL) 30 MG tablet Take 1 tablet (30 mg total) by mouth in the morning. Okay to restart around Monday, 05/14/2020 if blood pressure stable (Patient taking differently: Take 30 mg by mouth in the morning.) 30 tablet 3   Magnesium 100 MG TABS Take 50 mg by mouth at bedtime.     melatonin 5 MG TABS Take 2.5 mg by mouth at bedtime as needed (Sleep).     metoprolol succinate (TOPROL-XL) 100 MG 24 hr tablet Take 100 mg by mouth in the morning. Take with or immediately following a meal.     Multiple Vitamins-Minerals (CENTRUM SILVER ADULT 50+ PO) Take 1 tablet by mouth daily.     Omega-3 Fatty Acids (FISH OIL) 1000 MG CAPS Take  1,000 mg by mouth daily.     ondansetron (ZOFRAN) 4 MG tablet Take 1 tablet (4 mg total) by mouth every 6 (six) hours as needed for nausea. 20 tablet 0   pantoprazole (PROTONIX) 40 MG tablet Take 1 tablet (40 mg total) by mouth daily. 90 tablet 3   polyethylene glycol powder (GLYCOLAX/MIRALAX) 17 GM/SCOOP powder Take 17 g by mouth daily as needed for mild constipation or moderate constipation (With lunch).     Polyvinyl Alcohol-Povidone PF (REFRESH) 1.4-0.6 % SOLN Apply 1 drop to eye as needed.     simvastatin (ZOCOR) 20 MG tablet Take 20 mg by mouth at bedtime.     sodium chloride (OCEAN) 0.65 % SOLN nasal spray Place 1 spray into both nostrils See admin instructions. Use 1 spray in each nostril in the morning may use a second dose at night as needed for congestion     triamcinolone (KENALOG) 0.1 % Apply 1 application topically daily as needed (irritation).     vitamin C (ASCORBIC ACID) 500 MG tablet Take 1,000 mg by mouth 2 (two) times daily.     Vitamins A & D (VITAMIN A & D) ointment Apply 1 application topically daily as needed for dry skin (irritation).     No current facility-administered medications for this visit.     Past Surgical History:  Procedure Laterality Date   ABDOMINAL HYSTERECTOMY     BIOPSY  05/08/2020   Procedure: BIOPSY;  Surgeon: Harvel Quale, MD;  [facility];  Service: Gastroenterology;;   BREAST SURGERY     CARDIAC CATHETERIZATION     CHOLECYSTECTOMY     COLONOSCOPY N/A 08/14/2015   Procedure: COLONOSCOPY;  Surgeon: Aviva Signs, MD;  [facility];  Service: Gastroenterology;  Laterality: N/A;   ESOPHAGOGASTRODUODENOSCOPY (EGD) WITH PROPOFOL N/A 05/08/2020   Procedure: ESOPHAGOGASTRODUODENOSCOPY (EGD) WITH PROPOFOL;  Surgeon: Harvel Quale, MD;  [facility];  Service: Gastroenterology;  Laterality: N/A;   ESOPHAGOGASTRODUODENOSCOPY (EGD) WITH PROPOFOL N/A 08/07/2020   Procedure: ESOPHAGOGASTRODUODENOSCOPY  (EGD) WITH PROPOFOL;  Surgeon: Harvel Quale, MD;  [facility];  Service: Gastroenterology;  Laterality: N/A;  7:30   IR IMAGING GUIDED PORT INSERTION  06/04/2020   ROBOTIC ASSISTED TOTAL HYSTERECTOMY WITH BILATERAL SALPINGO OOPHERECTOMY  N/A 05/03/2020   Procedure: XI ROBOTIC ASSISTED TOTAL HYSTERECTOMY WITH BILATERAL SALPINGO OOPHORECTOMY, OMENTECTOMY,RADICAL TUMOR DEBULKING, RIGID PROSTOSCOPY;  Surgeon: Everitt Amber, MD;  Location: [facility];  Service: Gynecology;  Laterality: N/A;   THROMBECTOMY FEMORAL ARTERY Right 12/17/2013   Procedure: REPAIR OF RIGHT FEMORAL ARTERY PSEUDOANEURYSM;  Surgeon: Elam Dutch, MD;  Location: [facility];  Service: Vascular;  Laterality: Right;     Allergies  Allergen Reactions   Morphine And Related Nausea And Vomiting   Zofran [Ondansetron]     Causes minor constipation, tolerates low doses        Family History  Problem Relation Age of Onset   Hypertension Mother    Heart failure Mother    Asthma Mother    Ulcers Mother    Benign prostatic hyperplasia Father    Ulcers Father    Esophageal varices Father    Atrial fibrillation Sister    Atrial fibrillation Brother    Heart disease Brother    Melanoma Maternal Aunt    Colon cancer Neg Hx    Breast cancer Neg Hx    Ovarian cancer Neg Hx    Endometrial cancer Neg Hx    Pancreatic cancer Neg Hx    Prostate cancer Neg Hx      Social History Ms. Garant reports that she has never smoked. She has never used smokeless tobacco. Ms. Hotchkiss reports no history of alcohol use.   Review of Systems CONSTITUTIONAL: No weight loss, fever, chills, weakness or fatigue.  HEENT: Eyes: No visual loss, blurred vision, double vision or yellow sclerae.No hearing loss, sneezing, congestion, runny nose or sore throat.  SKIN: No rash or itching.  CARDIOVASCULAR:  RESPIRATORY: No shortness of breath, cough or sputum.  GASTROINTESTINAL: No anorexia, nausea, vomiting or diarrhea. No abdominal pain  or blood.  GENITOURINARY: No burning on urination, no polyuria NEUROLOGICAL: No headache, dizziness, syncope, paralysis, ataxia, numbness or tingling in the extremities. No change in bowel or bladder control.  MUSCULOSKELETAL: No muscle, back pain, joint pain or stiffness.  LYMPHATICS: No enlarged nodes. No history of splenectomy.  PSYCHIATRIC: No history of depression or anxiety.  ENDOCRINOLOGIC: No reports of sweating, cold or heat intolerance. No polyuria or polydipsia.  Marland Kitchen   Physical Examination There were no vitals filed for this visit. There were no vitals filed for this visit.  Gen: resting comfortably, no acute distress HEENT: no scleral icterus, pupils equal round and reactive, no palptable cervical adenopathy,  CV Resp: Clear to auscultation bilaterally GI: abdomen is soft, non-tender, non-distended, normal bowel sounds, no hepatosplenomegaly MSK: extremities are warm, no edema.  Skin: warm, no rash Neuro:  no focal deficits Psych: appropriate affect   Diagnostic Studies 04/2018 echo   Study Conclusions   - Left ventricle: The cavity size was normal. Wall thickness was    increased in a pattern of moderate LVH. Systolic function was    normal. The estimated ejection fraction was in the range of 60%    to 65%. Wall motion was normal; there were no regional wall    motion abnormalities. Left ventricular diastolic function    parameters were normal.  - Mitral valve: Mildly thickened leaflets . There was mild    regurgitation.  - Left atrium: The atrium was mildly dilated.  - Right atrium: The atrium was at the upper limits of normal in    size.  - Inferior vena cava: The vessel was normal in size. The    respirophasic diameter changes were in  the normal range (>= 50%),    consistent with normal central venous pressure.     Assessment and Plan   1.PAF - infrequent episodes over the last 4 years, recent ER visits. Had been 3 years since last recurrence.  - EKG  today back in SR - continue toprol '100mg'$  daily and prn diltiazem for now. If increased frequent would d/c norvasc and start diltiazem '30mg'$  bid, would need to monitor HRs that could tolerate.  - restarted eliquis, was cleared by GI   2. LE edema - start lasix '20mg'$  prn     Arnoldo Lenis, M.D., F.A.C.C.

## 2022-08-14 ENCOUNTER — Ambulatory Visit: Payer: Medicare Other | Attending: Cardiology | Admitting: Cardiology

## 2022-08-14 ENCOUNTER — Encounter: Payer: Self-pay | Admitting: *Deleted

## 2022-08-14 ENCOUNTER — Encounter: Payer: Self-pay | Admitting: Cardiology

## 2022-08-14 VITALS — BP 140/80 | HR 71 | Ht 59.0 in | Wt 172.0 lb

## 2022-08-14 DIAGNOSIS — I480 Paroxysmal atrial fibrillation: Secondary | ICD-10-CM

## 2022-08-14 DIAGNOSIS — R600 Localized edema: Secondary | ICD-10-CM | POA: Diagnosis not present

## 2022-08-14 DIAGNOSIS — D6869 Other thrombophilia: Secondary | ICD-10-CM | POA: Diagnosis not present

## 2022-08-14 DIAGNOSIS — I10 Essential (primary) hypertension: Secondary | ICD-10-CM | POA: Diagnosis not present

## 2022-08-14 NOTE — Progress Notes (Signed)
Clinical Summary Ms. Friis is a 77 y.o.female  Afib - history of prior DCCV 05/01/2018 in setting of newly diagnosed afib, deemed to be of recent onset within hours to presentation - ER visit 04/2019 recurrent afib     03/22/22 ER visit with afib RVR - started on cardizem gtt, from note was paroxysmal then with rate control   - rare palpitations, last episode few months ago - no bleeding on eliquis       2. LE edema - edema is improving - has prn lasix, has not needed       3.HTN - home bp's 130s-140s/70s - frequent urination, would avoid diuretic         3. Hyperlipidemia   - she is on simvastatin, reports dose lowered to 66m daily about 4 months ago. Symptoms no better.  - history of neuropathy, carpal tunnel   08/2021 TC 188 TG 115 HDL 60 LDL 108   She reports normal cath [id number] complicated by pseudoaneurysm required a surgery   SH: former nurse. Husband BDeedre Gartheis also a patient of mine.  Past Medical History:  Diagnosis Date   [place] [place]    Anxiety    Arthritis    Asthma    hx of    Cancer ([facility])    ovarian   Dyspnea    with exertion    H/O seasonal allergies    Heart murmur    as aa child    Hypercholesteremia    Hypertension    Spinal stenosis      Allergies  Allergen Reactions   Morphine And Related Nausea And Vomiting   Zofran [Ondansetron]     Causes minor constipation, tolerates low doses       Current Outpatient Medications  Medication Sig Dispense Refill   acetaminophen (TYLENOL) 500 MG tablet Take 1,000 mg by mouth 3 (three) times daily.     albuterol (VENTOLIN HFA) 108 (90 Base) MCG/ACT inhaler Inhale 2 puffs into the lungs every 4 (four) hours as needed for wheezing or shortness of breath. 18 g 2   amLODipine (NORVASC) 10 MG tablet Take 1 tablet (10 mg total) by mouth daily with lunch. For BP 30 tablet 3   apixaban (ELIQUIS) 5 MG TABS tablet Take 1 tablet (5 mg total) by mouth 2 (two) times daily. 180 tablet 1   b  complex vitamins capsule Take 1 capsule by mouth at bedtime.     Calcium Carb-Cholecalciferol (CALCIUM 600/VITAMIN D PO) Take 600 mg by mouth in the morning and at bedtime.     cetirizine (ZYRTEC) 10 MG tablet Take 10 mg by mouth daily.     Cholecalciferol (VITAMIN D) 125 MCG (5000 UT) CAPS Take 5,000 Units by mouth daily.     Coenzyme Q10 (COQ10) 100 MG CAPS Take 100 mg by mouth at bedtime.      cyclobenzaprine (FLEXERIL) 10 MG tablet Take 5 mg by mouth See admin instructions. Take 5 mg at night, may take a second 5 mg dose during the day as needed for muscle spasms     diltiazem (CARDIZEM) 30 MG tablet Take 1 tablet every 4 hours AS NEEDED for AFIB heart rate >100 30 tablet 3   fluticasone (FLONASE) 50 MCG/ACT nasal spray Place 1 spray into both nostrils daily.      furosemide (LASIX) 20 MG tablet TAKE 1 TABLET BY MOUTH DAILY AS  NEEDED FOR SWELLING 100 tablet 2   gabapentin (NEURONTIN) 300 MG  capsule Take 300 mg by mouth 3 (three) times daily.     lisinopril (ZESTRIL) 30 MG tablet Take 1 tablet (30 mg total) by mouth in the morning. Okay to restart around Monday, 05/14/2020 if blood pressure stable (Patient taking differently: Take 30 mg by mouth in the morning.) 30 tablet 3   Magnesium 100 MG TABS Take 50 mg by mouth at bedtime.     melatonin 5 MG TABS Take 2.5 mg by mouth at bedtime as needed (Sleep).     metoprolol succinate (TOPROL-XL) 100 MG 24 hr tablet Take 100 mg by mouth in the morning. Take with or immediately following a meal.     Multiple Vitamins-Minerals (CENTRUM SILVER ADULT 50+ PO) Take 1 tablet by mouth daily.     Omega-3 Fatty Acids (FISH OIL) 1000 MG CAPS Take 1,000 mg by mouth daily.     ondansetron (ZOFRAN) 4 MG tablet Take 1 tablet (4 mg total) by mouth every 6 (six) hours as needed for nausea. 20 tablet 0   pantoprazole (PROTONIX) 40 MG tablet Take 1 tablet (40 mg total) by mouth daily. 90 tablet 3   polyethylene glycol powder (GLYCOLAX/MIRALAX) 17 GM/SCOOP powder Take 17  g by mouth daily as needed for mild constipation or moderate constipation (With lunch).     Polyvinyl Alcohol-Povidone PF (REFRESH) 1.4-0.6 % SOLN Apply 1 drop to eye as needed.     simvastatin (ZOCOR) 20 MG tablet Take 20 mg by mouth at bedtime.     sodium chloride (OCEAN) 0.65 % SOLN nasal spray Place 1 spray into both nostrils See admin instructions. Use 1 spray in each nostril in the morning may use a second dose at night as needed for congestion     triamcinolone (KENALOG) 0.1 % Apply 1 application topically daily as needed (irritation).     vitamin C (ASCORBIC ACID) 500 MG tablet Take 1,000 mg by mouth 2 (two) times daily.     Vitamins A & D (VITAMIN A & D) ointment Apply 1 application topically daily as needed for dry skin (irritation).     No current facility-administered medications for this visit.     Past Surgical History:  Procedure Laterality Date   ABDOMINAL HYSTERECTOMY     BIOPSY  05/08/2020   Procedure: BIOPSY;  Surgeon: Harvel Quale, MD;  [facility];  Service: Gastroenterology;;   BREAST SURGERY     CARDIAC CATHETERIZATION     CHOLECYSTECTOMY     COLONOSCOPY N/A 08/14/2015   Procedure: COLONOSCOPY;  Surgeon: Aviva Signs, MD;  [facility];  Service: Gastroenterology;  Laterality: N/A;   ESOPHAGOGASTRODUODENOSCOPY (EGD) WITH PROPOFOL N/A 05/08/2020   Procedure: ESOPHAGOGASTRODUODENOSCOPY (EGD) WITH PROPOFOL;  Surgeon: Harvel Quale, MD;  [facility];  Service: Gastroenterology;  Laterality: N/A;   ESOPHAGOGASTRODUODENOSCOPY (EGD) WITH PROPOFOL N/A 08/07/2020   Procedure: ESOPHAGOGASTRODUODENOSCOPY (EGD) WITH PROPOFOL;  Surgeon: Harvel Quale, MD;  [facility];  Service: Gastroenterology;  Laterality: N/A;  7:30   IR IMAGING GUIDED PORT INSERTION  06/04/2020   ROBOTIC ASSISTED TOTAL HYSTERECTOMY WITH BILATERAL SALPINGO OOPHERECTOMY N/A 05/03/2020   Procedure: XI ROBOTIC ASSISTED TOTAL  HYSTERECTOMY WITH BILATERAL SALPINGO OOPHORECTOMY, OMENTECTOMY,RADICAL TUMOR DEBULKING, RIGID PROSTOSCOPY;  Surgeon: Everitt Amber, MD;  Location[facility];  Service: Gynecology;  Laterality: N/A;   THROMBECTOMY FEMORAL ARTERY Right 12/17/2013   Procedure: REPAIR OF RIGHT FEMORAL ARTERY PSEUDOANEURYSM;  Surgeon: Elam Dutch, MD;  Location: [facility];  Service: Vascular;  Laterality: Right;  Allergies  Allergen Reactions   Morphine And Related Nausea And Vomiting   Zofran [Ondansetron]     Causes minor constipation, tolerates low doses        Family History  Problem Relation Age of Onset   Hypertension Mother    Heart failure Mother    Asthma Mother    Ulcers Mother    Benign prostatic hyperplasia Father    Ulcers Father    Esophageal varices Father    Atrial fibrillation Sister    Atrial fibrillation Brother    Heart disease Brother    Melanoma Maternal Aunt    Colon cancer Neg Hx    Breast cancer Neg Hx    Ovarian cancer Neg Hx    Endometrial cancer Neg Hx    Pancreatic cancer Neg Hx    Prostate cancer Neg Hx      Social History Ms. Sasso reports that she has never smoked. She has never used smokeless tobacco. Ms. Radovic reports no history of alcohol use.   Review of Systems CONSTITUTIONAL: No weight loss, fever, chills, weakness or fatigue.  HEENT: Eyes: No visual loss, blurred vision, double vision or yellow sclerae.No hearing loss, sneezing, congestion, runny nose or sore throat.  SKIN: No rash or itching.  CARDIOVASCULAR: per hpi RESPIRATORY: No shortness of breath, cough or sputum.  GASTROINTESTINAL: No anorexia, nausea, vomiting or diarrhea. No abdominal pain or blood.  GENITOURINARY: No burning on urination, no polyuria NEUROLOGICAL: No headache, dizziness, syncope, paralysis, ataxia, numbness or tingling in the extremities. No change in bowel or bladder control.  MUSCULOSKELETAL: No muscle, back pain, joint pain or stiffness.  LYMPHATICS: No enlarged nodes. No  history of splenectomy.  PSYCHIATRIC: No history of depression or anxiety.  ENDOCRINOLOGIC: No reports of sweating, cold or heat intolerance. No polyuria or polydipsia.  Marland Kitchen   Physical Examination Today's Vitals   08/14/22 1111  BP: (!) 142/88  Pulse: 71  SpO2: 99%  Weight: 172 lb (78 kg)  Height: 4' 11"$  (1.499 m)   Body mass index is 34.74 kg/m.  Gen: resting comfortably, no acute distress HEENT: no scleral icterus, pupils equal round and reactive, no palptable cervical adenopathy,  CV: RRR, no m/rg, no jvd Resp: Clear to auscultation bilaterally GI: abdomen is soft, non-tender, non-distended, normal bowel sounds, no hepatosplenomegaly MSK: extremities are warm, no edema.  Skin: warm, no rash Neuro:  no focal deficits Psych: appropriate affect   Diagnostic Studies 04/2018 echo   Study Conclusions   - Left ventricle: The cavity size was normal. Wall thickness was    increased in a pattern of moderate LVH. Systolic function was    normal. The estimated ejection fraction was in the range of 60%    to 65%. Wall motion was normal; there were no regional wall    motion abnormalities. Left ventricular diastolic function    parameters were normal.  - Mitral valve: Mildly thickened leaflets . There was mild    regurgitation.  - Left atrium: The atrium was mildly dilated.  - Right atrium: The atrium was at the upper limits of normal in    size.  - Inferior vena cava: The vessel was normal in size. The    respirophasic diameter changes were in the normal range (>= 50%),    consistent with normal central venous pressure.     Assessment and Plan  1.PAF/acquired thrombophilia - overall doing well, very infrequent symptoms - continue current meds including eliquis for stroke prevention   2. LE  edema -doing well, has prn lasix 48m which has not needed  3.Hyperlipidemia - at goal,continue current meds  4. HTN - elevated here, home bp's borderline130s-140s. Discussed  adjusting meds, she is in favor of continuing current regimen and monitoring further at this time.     F/u 6 months     JArnoldo Lenis M.D.

## 2022-08-14 NOTE — Patient Instructions (Addendum)
Medication Instructions:  Continue all current medications.   Labwork: none  Testing/Procedures: none  Follow-Up: 6 months   Any Other Special Instructions Will Be Listed Below (If Applicable).   If you need a refill on your cardiac medications before your next appointment, please call your pharmacy.  

## 2022-08-15 ENCOUNTER — Encounter: Payer: Self-pay | Admitting: Cardiology

## 2022-08-18 ENCOUNTER — Ambulatory Visit (HOSPITAL_COMMUNITY)
Admission: RE | Admit: 2022-08-18 | Discharge: 2022-08-18 | Disposition: A | Discharge status: Home / Self Care | Payer: Medicare Other | Source: Ambulatory Visit | Attending: Hematology | Admitting: Hematology

## 2022-08-18 ENCOUNTER — Inpatient Hospital Stay: Payer: Medicare Other | Attending: Hematology

## 2022-08-18 VITALS — BP 174/75 | HR 72 | Temp 98.2°F | Resp 20

## 2022-08-18 DIAGNOSIS — Z95828 Presence of other vascular implants and grafts: Secondary | ICD-10-CM

## 2022-08-18 DIAGNOSIS — K5730 Diverticulosis of large intestine without perforation or abscess without bleeding: Secondary | ICD-10-CM | POA: Diagnosis not present

## 2022-08-18 DIAGNOSIS — I700 Atherosclerosis of aorta: Secondary | ICD-10-CM | POA: Insufficient documentation

## 2022-08-18 DIAGNOSIS — Z90710 Acquired absence of both cervix and uterus: Secondary | ICD-10-CM | POA: Insufficient documentation

## 2022-08-18 DIAGNOSIS — C561 Malignant neoplasm of right ovary: Secondary | ICD-10-CM

## 2022-08-18 DIAGNOSIS — Z79899 Other long term (current) drug therapy: Secondary | ICD-10-CM | POA: Insufficient documentation

## 2022-08-18 LAB — COMPREHENSIVE METABOLIC PANEL
ALT: 21 U/L (ref 0–44)
AST: 27 U/L (ref 15–41)
Albumin: 4.4 g/dL (ref 3.5–5.0)
Alkaline Phosphatase: 56 U/L (ref 38–126)
Anion gap: 12 (ref 5–15)
BUN: 16 mg/dL (ref 8–23)
CO2: 24 mmol/L (ref 22–32)
Calcium: 9.2 mg/dL (ref 8.9–10.3)
Chloride: 98 mmol/L (ref 98–111)
Creatinine, Ser: 0.6 mg/dL (ref 0.44–1.00)
GFR, Estimated: >60 mL/min (ref >60)
Glucose, Bld: 97 mg/dL (ref 70–99)
Potassium: 4.1 mmol/L (ref 3.5–5.1)
Sodium: 134 mmol/L — ABNORMAL LOW (ref 135–145)
Total Bilirubin: 0.7 mg/dL (ref 0.3–1.2)
Total Protein: 7.6 g/dL (ref 6.5–8.1)

## 2022-08-18 LAB — CBC WITH DIFFERENTIAL/PLATELET
Abs Immature Granulocytes: 0.03 10*3/uL (ref 0.00–0.07)
Basophils Absolute: 0 10*3/uL (ref 0.0–0.1)
Basophils Relative: 0 %
Eosinophils Absolute: 0.1 10*3/uL (ref 0.0–0.5)
Eosinophils Relative: 1 %
HCT: 37.5 % (ref 36.0–46.0)
Hemoglobin: 12.9 g/dL (ref 12.0–15.0)
Immature Granulocytes: 0 %
Lymphocytes Relative: 27 %
Lymphs Abs: 2.1 10*3/uL (ref 0.7–4.0)
MCH: 33.5 pg (ref 26.0–34.0)
MCHC: 34.4 g/dL (ref 30.0–36.0)
MCV: 97.4 fL (ref 80.0–100.0)
Monocytes Absolute: 0.6 10*3/uL (ref 0.1–1.0)
Monocytes Relative: 8 %
Neutro Abs: 4.8 10*3/uL (ref 1.7–7.7)
Neutrophils Relative %: 64 %
Platelets: 314 10*3/uL (ref 150–400)
RBC: 3.85 MIL/uL — ABNORMAL LOW (ref 3.87–5.11)
RDW: 12.4 % (ref 11.5–15.5)
WBC: 7.6 10*3/uL (ref 4.0–10.5)
nRBC: 0 % (ref 0.0–0.2)

## 2022-08-18 MED ORDER — HEPARIN SOD (PORK) LOCK FLUSH 100 UNIT/ML IV SOLN
INTRAVENOUS | Status: AC
  Administered 2022-08-18: 500 [IU] via INTRAVENOUS
  Filled 2022-08-18: qty 5

## 2022-08-18 MED ORDER — IOHEXOL 300 MG/ML  SOLN
100.0000 mL | Freq: Once | INTRAMUSCULAR | Status: AC | PRN
  Administered 2022-08-18: 100 mL via INTRAVENOUS

## 2022-08-18 MED ORDER — SODIUM CHLORIDE 0.9% FLUSH
10.0000 mL | Freq: Once | INTRAVENOUS | Status: AC
  Administered 2022-08-18: 10 mL via INTRAVENOUS

## 2022-08-18 NOTE — Progress Notes (Signed)
Port flushed with good blood return noted. No bruising or swelling at site patient left accessed for CT scan and patient discharged in satisfactory condition. VVS stable with no signs or symptoms of distressed noted.

## 2022-08-18 NOTE — Patient Instructions (Signed)
[facility]  Discharge Instructions: Thank you for choosing [facility] to provide your oncology and hematology care.  If you have a lab appointment with the [facility], please come in thru the Main Entrance and check in at the main information desk.  Wear comfortable clothing and clothing appropriate for easy access to any Portacath or PICC line.   We strive to give you quality time with your provider. You may need to reschedule your appointment if you arrive late (15 or more minutes).  Arriving late affects you and other patients whose appointments are after yours.  Also, if you miss three or more appointments without notifying the office, you may be dismissed from the clinic at the provider's discretion.      For prescription refill requests, have your pharmacy contact our office and allow 72 hours for refills to be completed.    Today you received the following port accessed for CT scan, return as scheduled.   To help prevent nausea and vomiting after your treatment, we encourage you to take your nausea medication as directed.  BELOW ARE SYMPTOMS THAT SHOULD BE REPORTED IMMEDIATELY: *FEVER GREATER THAN 100.4 F (38 C) OR HIGHER *CHILLS OR SWEATING *NAUSEA AND VOMITING THAT IS NOT CONTROLLED WITH YOUR NAUSEA MEDICATION *UNUSUAL SHORTNESS OF BREATH *UNUSUAL BRUISING OR BLEEDING *URINARY PROBLEMS (pain or burning when urinating, or frequent urination) *BOWEL PROBLEMS (unusual diarrhea, constipation, pain near the anus) TENDERNESS IN MOUTH AND THROAT WITH OR WITHOUT PRESENCE OF ULCERS (sore throat, sores in mouth, or a toothache) UNUSUAL RASH, SWELLING OR PAIN  UNUSUAL VAGINAL DISCHARGE OR ITCHING   Items with * indicate a potential emergency and should be followed up as soon as possible or go to the Emergency Department if any problems should occur.  Please show the CHEMOTHERAPY ALERT CARD or IMMUNOTHERAPY ALERT CARD at check-in to the Emergency  Department and triage nurse.  Should you have questions after your visit or need to cancel or reschedule your appointment, please contact [facility] [phone]  and follow the prompts.  Office hours are 8:00 a.m. to 4:30 p.m. Monday - Friday. Please note that voicemails left after 4:00 p.m. may not be returned until the following business day.  We are closed weekends and major holidays. You have access to a nurse at all times for urgent questions. Please call the main number to the clinic [phone] and follow the prompts.  For any non-urgent questions, you may also contact your provider using MyChart. We now offer e-Visits for anyone 44 and older to request care online for non-urgent symptoms. For details visit [email or web address].   Also download the MyChart app! Go to the app store, search "MyChart", open the app, select [facility], and log in with your MyChart username and password.

## 2022-08-19 LAB — CA 125: Cancer Antigen (CA) 125: 29.8 U/mL (ref 0.0–38.1)

## 2022-08-25 ENCOUNTER — Ambulatory Visit: Payer: Medicare Other | Admitting: Hematology

## 2022-09-01 ENCOUNTER — Encounter: Payer: Self-pay | Admitting: Hematology

## 2022-09-01 ENCOUNTER — Inpatient Hospital Stay: Payer: Medicare Other | Attending: Hematology | Admitting: Hematology

## 2022-09-01 VITALS — BP 147/68 | HR 70 | Temp 97.8°F | Resp 18 | Wt 171.0 lb

## 2022-09-01 DIAGNOSIS — G629 Polyneuropathy, unspecified: Secondary | ICD-10-CM | POA: Insufficient documentation

## 2022-09-01 DIAGNOSIS — Z825 Family history of asthma and other chronic lower respiratory diseases: Secondary | ICD-10-CM | POA: Diagnosis not present

## 2022-09-01 DIAGNOSIS — Z8379 Family history of other diseases of the digestive system: Secondary | ICD-10-CM | POA: Diagnosis not present

## 2022-09-01 DIAGNOSIS — Z90722 Acquired absence of ovaries, bilateral: Secondary | ICD-10-CM | POA: Diagnosis not present

## 2022-09-01 DIAGNOSIS — I4891 Unspecified atrial fibrillation: Secondary | ICD-10-CM | POA: Insufficient documentation

## 2022-09-01 DIAGNOSIS — Z808 Family history of malignant neoplasm of other organs or systems: Secondary | ICD-10-CM | POA: Diagnosis not present

## 2022-09-01 DIAGNOSIS — Z888 Allergy status to other drugs, medicaments and biological substances status: Secondary | ICD-10-CM | POA: Insufficient documentation

## 2022-09-01 DIAGNOSIS — Z885 Allergy status to narcotic agent status: Secondary | ICD-10-CM | POA: Insufficient documentation

## 2022-09-01 DIAGNOSIS — I10 Essential (primary) hypertension: Secondary | ICD-10-CM | POA: Diagnosis not present

## 2022-09-01 DIAGNOSIS — Z79899 Other long term (current) drug therapy: Secondary | ICD-10-CM | POA: Diagnosis not present

## 2022-09-01 DIAGNOSIS — Z8249 Family history of ischemic heart disease and other diseases of the circulatory system: Secondary | ICD-10-CM | POA: Diagnosis not present

## 2022-09-01 DIAGNOSIS — Z90710 Acquired absence of both cervix and uterus: Secondary | ICD-10-CM | POA: Diagnosis not present

## 2022-09-01 DIAGNOSIS — Z9049 Acquired absence of other specified parts of digestive tract: Secondary | ICD-10-CM | POA: Insufficient documentation

## 2022-09-01 DIAGNOSIS — K91841 Postprocedural hemorrhage and hematoma of a digestive system organ or structure following other procedure: Secondary | ICD-10-CM | POA: Diagnosis not present

## 2022-09-01 DIAGNOSIS — K5900 Constipation, unspecified: Secondary | ICD-10-CM | POA: Insufficient documentation

## 2022-09-01 DIAGNOSIS — C561 Malignant neoplasm of right ovary: Secondary | ICD-10-CM | POA: Insufficient documentation

## 2022-09-01 NOTE — Progress Notes (Signed)
[facility] [address] [place] [address]    Clinic Day:  09/01/2022  Referring physician: Manon Hilding, MD  Patient Care Team: Manon Hilding, MD as PCP - General (Family Medicine) Harl Bowie Alphonse Guild, MD as PCP - Cardiology (Cardiology) Brien Mates, RN as Oncology Nurse Navigator (Oncology)   ASSESSMENT & PLAN:   Assessment: 1.  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 [id number] on 04/20/2020 -Evaluated by Dr. Denman George on 05/24/2020. -6 cycles of carboplatin and paclitaxel was recommended. -Somatic and germline mutation testing was negative. -6 cycles of carboplatin and paclitaxel from 06/06/2020 through 09/19/2020.   2.  Atrial fibrillation: -Was on Eliquis. -She had postoperative GI bleed when Eliquis was discontinued.    Plan: 1.  Stage II clear-cell right ovarian cancer: - She denies any abdominal pain or bloating. - CT [facility] on 08/18/2022: No evidence of recurrence. - Labs from 08/18/2022: CA125 normal at 29.8.  LFTs are normal. - She takes MiraLAX 2-3 times per week for constipation. - Recommend follow-up in 6 months with labs including tumor marker.  I will do CT scan if tumor markers go up. - I have recommended follow-up with Dr. Berline Lopes.   2.  Peripheral neuropathy: - She takes gabapentin 300 mg at bedtime as needed up to 3 times per week.   3.  Genetic testing: - Somatic and germline mutation testing was negative.  Orders Placed This Encounter  Procedures   CBC with Differential/Platelet    Standing Status:   Future    Standing Expiration Date:   09/01/2023    Order Specific Question:   Release to patient    Answer:   Immediate   Comprehensive metabolic panel    Standing Status:   Future    Standing Expiration Date:   09/01/2023    Order Specific Question:   Release to patient    Answer:   Immediate   CA 125    Standing Status:    Future    Standing Expiration Date:   09/01/2023      I,Alexis Herring,acting as a scribe for Derek Jack, MD.,have documented all relevant documentation on the behalf of Derek Jack, MD,as directed by  Derek Jack, MD while in the presence of Derek Jack, MD.   I, Derek Jack MD, have reviewed the above documentation for accuracy and completeness, and I agree with the above.   Derek Jack, MD   3/11/20244:01 PM  CHIEF COMPLAINT:   Diagnosis: right ovarian cancer    Cancer Staging  Right ovarian epithelial cancer [facility]) Staging form: Ovary, Fallopian Tube, and Primary Peritoneal Carcinoma, AJCC 8th Edition - Clinical stage from 05/03/2020: FIGO Stage II, calculated as Stage Unknown (cT2b, cNX, cM0) - Signed by Everitt Amber, MD on 05/24/2020    Prior Therapy:  1. TAH-BSO on 05/03/2020. 2. Carboplatin, paclitaxel & Aloxi x 6 cycles from 06/06/2020 to 09/19/2020.  Current Therapy:  surveillance    HISTORY OF PRESENT ILLNESS:   Oncology History  Right ovarian epithelial cancer ([place])  05/03/2020 Initial Diagnosis   Right ovarian epithelial cancer ([place])   05/03/2020 Cancer Staging   Staging form: Ovary, Fallopian Tube, and Primary Peritoneal Carcinoma, AJCC 8th Edition - Clinical stage from 05/03/2020: FIGO Stage II, calculated as Stage Unknown (cT2b, cNX, cM0) - Signed by Everitt Amber, MD on 05/24/2020   06/06/2020 - 09/21/2020 Chemotherapy   Patient is on Treatment Plan :  OVARIAN Carboplatin (AUC 6) / Paclitaxel (175) q21d x 6 cycles      Genetic Testing   Negative genetic testing. No pathogenic variants identified on the [employer]. The report date is 08/10/2020.  The CancerNext gene panel offered by [employer] includes sequencing and rearrangement analysis for the following 36 genes: APC*, ATM*, AXIN2, BARD1, BMPR1A, BRCA1*, BRCA2*, BRIP1*, CDH1*, CDK4, CDKN2A, CHEK2*, DICER1, MLH1*, MSH2*, MSH3,  MSH6*, MUTYH*, NBN, NF1*, NTHL1, PALB2*, PMS2*, PTEN*, RAD51C*, RAD51D*, RECQL, SMAD4, SMARCA4, STK11 and TP53* (sequencing and deletion/duplication); HOXB13, POLD1 and POLE (sequencing only); EPCAM and GREM1 (deletion/duplication only).   Somatic genes analyzed through TumorNext-HRD: ATM, BARD1, BRCA1, BRCA2, BRIP1, CHEK2, MRE11A, NBN, PALB2, RAD51C, RAD51D.      INTERVAL HISTORY:   April Guerra is a 77 y.o. female presenting to clinic today for follow up of right ovarian cancer. She was last seen by me on 02/18/22.  Today, she states that she is doing well overall. Her appetite level is at 100%. Her energy level is at 100%. She denies any abdominal pain or bloating.   PAST MEDICAL HISTORY:   Past Medical History: Past Medical History:  Diagnosis Date   A-fib ([place])    Anxiety    Arthritis    Asthma    hx of    Cancer ([place])    ovarian   Dyspnea    with exertion    H/O seasonal allergies    Heart murmur    as aa child    Hypercholesteremia    Hypertension    Spinal stenosis     Surgical History: Past Surgical History:  Procedure Laterality Date   ABDOMINAL HYSTERECTOMY     BIOPSY  05/08/2020   Procedure: BIOPSY;  Surgeon: Harvel Quale, MD;  [facility];  Service: Gastroenterology;;   BREAST SURGERY     CARDIAC CATHETERIZATION     CHOLECYSTECTOMY     COLONOSCOPY N/A 08/14/2015   Procedure: COLONOSCOPY;  Surgeon: Aviva Signs, MD;  Location: [facility];  Service: Gastroenterology;  Laterality: N/A;   ESOPHAGOGASTRODUODENOSCOPY (EGD) WITH PROPOFOL N/A 05/08/2020   Procedure: ESOPHAGOGASTRODUODENOSCOPY (EGD) WITH PROPOFOL;  Surgeon: [place]vel Quale, MD;  Location[facility];  Service: Gastroenterology;  Laterality: N/A;   ESOPHAGOGASTRODUODENOSCOPY (EGD) WITH PROPOFOL N/A 08/07/2020   Procedure: ESOPHAGOGASTRODUODENOSCOPY (EGD) WITH PROPOFOL;  Surgeon: Harvel Quale, MD;  Location[facility];  Service: Gastroenterology;   Laterality: N/A;  7:30   IR IMAGING GUIDED PORT INSERTION  06/04/2020   ROBOTIC ASSISTED TOTAL HYSTERECTOMY WITH BILATERAL SALPINGO OOPHERECTOMY N/A 05/03/2020   Procedure: XI ROBOTIC ASSISTED TOTAL HYSTERECTOMY WITH BILATERAL SALPINGO OOPHORECTOMY, OMENTECTOMY,RADICAL TUMOR DEBULKING, RIGID PROSTOSCOPY;  Surgeon: Everitt Amber, MD;  Location: [facility] ORS;  Service: Gynecology;  Laterality: N/A;   THROMBECTOMY FEMORAL ARTERY Right 12/17/2013   Procedure: REPAIR OF RIGHT FEMORAL ARTERY PSEUDOANEURYSM;  Surgeon: Elam Dutch, MD;  Location: [facility] OR;  Service: Vascular;  Laterality: Right;    Social History: Social History   Socioeconomic History   Marital status: Married    Spouse name: Not on file   Number of children: 4   Years of education: college   Highest education level: Not on file  Occupational History   Occupation: retired Marine scientist  Tobacco Use   Smoking status: Never   Smokeless tobacco: Never  Vaping Use   Vaping Use: Never used  Substance and Sexual Activity   Alcohol use: No   Drug use: No   Sexual activity: Not Currently  Other Topics Concern   Not on file  Social History Narrative   Lives at home with husband.   Left-handed.   Caffeine use: 1 cup coffee (half caff), half can of Coke   Social Determinants of Health   Financial Resource Strain: Low Risk  (05/29/2020)   Overall Financial Resource Strain (CARDIA)    Difficulty of Paying Living Expenses: Not hard at all  Food Insecurity: No Food Insecurity (05/29/2020)   Hunger Vital Sign    Worried About Running Out of Food in the Last Year: Never true    Ran Out of Food in the Last Year: Never true  Transportation Needs: No Transportation Needs (05/29/2020)   PRAPARE - Hydrologist (Medical): No    Lack of Transportation (Non-Medical): No  Physical Activity: Insufficiently Active (05/29/2020)   Exercise Vital Sign    Days of Exercise per Week: 7 days    Minutes of Exercise per Session: 10  min  Stress: No Stress Concern Present (05/29/2020)   [place]    Feeling of Stress : Not at all  Social Connections: Moderately Integrated (05/29/2020)   Social Connection and Isolation Panel [NHANES]    Frequency of Communication with Friends and Family: More than three times a week    Frequency of Social Gatherings with Friends and Family: More than three times a week    Attends Religious Services: More than 4 times per year    Active Member of [employer] or Organizations: No    Attends Archivist Meetings: Never    Marital Status: Married  Human resources officer Violence: Not At Risk (05/29/2020)   Humiliation, Afraid, Rape, and Kick questionnaire    Fear of Current or Ex-Partner: No    Emotionally Abused: No    Physically Abused: No    Sexually Abused: No    Family History: Family History  Problem Relation Age of Onset   Hypertension Mother    Heart failure Mother    Asthma Mother    Ulcers Mother    Benign prostatic hyperplasia Father    Ulcers Father    Esophageal varices Father    Atrial fibrillation Sister    Atrial fibrillation Brother    Heart disease Brother    Melanoma Maternal Aunt    Colon cancer Neg Hx    Breast cancer Neg Hx    Ovarian cancer Neg Hx    Endometrial cancer Neg Hx    Pancreatic cancer Neg Hx    Prostate cancer Neg Hx     Current Medications:  Current Outpatient Medications:    acetaminophen (TYLENOL) 500 MG tablet, Take 1,000 mg by mouth 3 (three) times daily., Disp: , Rfl:    albuterol (VENTOLIN HFA) 108 (90 Base) MCG/ACT inhaler, Inhale 2 puffs into the lungs every 4 (four) hours as needed for wheezing or shortness of breath., Disp: 18 g, Rfl: 2   amLODipine (NORVASC) 10 MG tablet, Take 1 tablet (10 mg total) by mouth daily with lunch. For BP, Disp: 30 tablet, Rfl: 3   apixaban (ELIQUIS) 5 MG TABS tablet, Take 1 tablet (5 mg total) by mouth 2 (two) times daily., Disp: 180  tablet, Rfl: 1   b complex vitamins capsule, Take 1 capsule by mouth at bedtime., Disp: , Rfl:    Calcium Carb-Cholecalciferol (CALCIUM 600/VITAMIN D PO), Take 600 mg by mouth in the morning and at bedtime., Disp: , Rfl:    cetirizine (ZYRTEC)  10 MG tablet, Take 10 mg by mouth daily., Disp: , Rfl:    Cholecalciferol (VITAMIN D) 125 MCG (5000 UT) CAPS, Take 5,000 Units by mouth daily., Disp: , Rfl:    Coenzyme Q10 (COQ10) 100 MG CAPS, Take 100 mg by mouth at bedtime. , Disp: , Rfl:    cyclobenzaprine (FLEXERIL) 10 MG tablet, Take 5 mg by mouth See admin instructions. Take 5 mg at night, may take a second 5 mg dose during the day as needed for muscle spasms, Disp: , Rfl:    diltiazem (CARDIZEM) 30 MG tablet, Take 1 tablet every 4 hours AS NEEDED for AFIB heart rate >100, Disp: 30 tablet, Rfl: 3   fluticasone (FLONASE) 50 MCG/ACT nasal spray, Place 1 spray into both nostrils daily. , Disp: , Rfl:    furosemide (LASIX) 20 MG tablet, TAKE 1 TABLET BY MOUTH DAILY AS  NEEDED FOR SWELLING, Disp: 100 tablet, Rfl: 2   gabapentin (NEURONTIN) 300 MG capsule, Take 300 mg by mouth 3 (three) times daily., Disp: , Rfl:    lisinopril (ZESTRIL) 30 MG tablet, Take 1 tablet (30 mg total) by mouth in the morning. Okay to restart around Monday, 05/14/2020 if blood pressure stable (Patient taking differently: Take 30 mg by mouth in the morning.), Disp: 30 tablet, Rfl: 3   Magnesium 100 MG TABS, Take 50 mg by mouth at bedtime., Disp: , Rfl:    melatonin 5 MG TABS, Take 2.5 mg by mouth at bedtime as needed (Sleep)., Disp: , Rfl:    metoprolol succinate (TOPROL-XL) 100 MG 24 hr tablet, Take 100 mg by mouth in the morning. Take with or immediately following a meal., Disp: , Rfl:    Multiple Vitamins-Minerals (CENTRUM SILVER ADULT 50+ PO), Take 1 tablet by mouth daily., Disp: , Rfl:    Omega-3 Fatty Acids (FISH OIL) 1000 MG CAPS, Take 1,000 mg by mouth daily., Disp: , Rfl:    ondansetron (ZOFRAN) 4 MG tablet, Take 1 tablet (4  mg total) by mouth every 6 (six) hours as needed for nausea., Disp: 20 tablet, Rfl: 0   pantoprazole (PROTONIX) 40 MG tablet, Take 1 tablet (40 mg total) by mouth daily., Disp: 90 tablet, Rfl: 3   polyethylene glycol powder (GLYCOLAX/MIRALAX) 17 GM/SCOOP powder, Take 17 g by mouth daily as needed for mild constipation or moderate constipation (With lunch)., Disp: , Rfl:    Polyvinyl Alcohol-Povidone PF (REFRESH) 1.4-0.6 % SOLN, Apply 1 drop to eye as needed., Disp: , Rfl:    simvastatin (ZOCOR) 20 MG tablet, Take 20 mg by mouth at bedtime., Disp: , Rfl:    sodium chloride (OCEAN) 0.65 % SOLN nasal spray, Place 1 spray into both nostrils See admin instructions. Use 1 spray in each nostril in the morning may use a second dose at night as needed for congestion, Disp: , Rfl:    triamcinolone (KENALOG) 0.1 %, Apply 1 application topically daily as needed (irritation)., Disp: , Rfl:    vitamin C (ASCORBIC ACID) 500 MG tablet, Take 1,000 mg by mouth 2 (two) times daily., Disp: , Rfl:    Vitamins A & D (VITAMIN A & D) ointment, Apply 1 application topically daily as needed for dry skin (irritation)., Disp: , Rfl:    Allergies: Allergies  Allergen Reactions   Morphine And Related Nausea And Vomiting   Zofran [Ondansetron]     Causes minor constipation, tolerates low doses      REVIEW OF SYSTEMS:   Review of Systems  Constitutional:  Negative for chills, fatigue and fever.  HENT:   Negative for lump/mass, mouth sores, nosebleeds, sore throat and trouble swallowing.   Eyes:  Negative for eye problems.  Respiratory:  Negative for cough and shortness of breath.   Cardiovascular:  Negative for chest pain, leg swelling and palpitations.  Gastrointestinal:  Negative for abdominal pain, constipation, diarrhea, nausea and vomiting.  Genitourinary:  Negative for bladder incontinence, difficulty urinating, dysuria, frequency, hematuria and nocturia.   Musculoskeletal:  Negative for arthralgias, back pain,  flank pain, myalgias and neck pain.  Skin:  Negative for itching and rash.  Neurological:  Negative for dizziness, headaches and numbness.  Hematological:  Does not bruise/bleed easily.  Psychiatric/Behavioral:  Negative for depression, sleep disturbance and suicidal ideas. The patient is not nervous/anxious.   All other systems reviewed and are negative.    VITALS:   Blood pressure (!) 147/68, pulse 70, temperature 97.8 F (36.6 C), resp. rate 18, weight 171 lb (77.6 kg), SpO2 98 %.  Wt Readings from Last 3 Encounters:  09/01/22 171 lb (77.6 kg)  08/14/22 172 lb (78 kg)  03/24/22 173 lb (78.5 kg)    Body mass index is 34.54 kg/m.  Performance status (ECOG): 1 - Symptomatic but completely ambulatory  PHYSICAL EXAM:   Physical Exam Vitals and nursing note reviewed. Exam conducted with a chaperone present.  Constitutional:      Appearance: Normal appearance.  Cardiovascular:     Rate and Rhythm: Normal rate and regular rhythm.     Pulses: Normal pulses.     Heart sounds: Normal heart sounds.  Pulmonary:     Effort: Pulmonary effort is normal.     Breath sounds: Normal breath sounds.  Abdominal:     Palpations: Abdomen is soft. There is no hepatomegaly, splenomegaly or mass.     Tenderness: There is no abdominal tenderness.  Musculoskeletal:     Right lower leg: No edema.     Left lower leg: No edema.  Lymphadenopathy:     Cervical: No cervical adenopathy.     Right cervical: No superficial, deep or posterior cervical adenopathy.    Left cervical: No superficial, deep or posterior cervical adenopathy.     Upper Body:     Right upper body: No supraclavicular or axillary adenopathy.     Left upper body: No supraclavicular or axillary adenopathy.  Neurological:     General: No focal deficit present.     Mental Status: She is alert and oriented to person, place, and time.  Psychiatric:        Mood and Affect: Mood normal.        Behavior: Behavior normal.     LABS:       Latest Ref Rng & Units 08/18/2022    1:16 PM 03/22/2022    9:52 PM 02/04/2022   12:23 PM  CBC  WBC 4.0 - 10.5 K/uL 7.6  8.1  6.3   Hemoglobin 12.0 - 15.0 g/dL 12.9  13.4  12.8   Hematocrit 36.0 - 46.0 % 37.5  38.5  37.0   Platelets 150 - 400 K/uL 314  332  323       Latest Ref Rng & Units 08/18/2022    1:16 PM 03/22/2022    9:52 PM 02/04/2022   12:23 PM  CMP  Glucose 70 - 99 mg/dL 97  102  89   BUN 8 - 23 mg/dL '16  20  15   '$ Creatinine 0.44 - 1.00 mg/dL 0.60  0.78  0.66   Sodium 135 - 145 mmol/L 134  138  135   Potassium 3.5 - 5.1 mmol/L 4.1  3.6  3.7   Chloride 98 - 111 mmol/L 98  106  103   CO2 22 - 32 mmol/L '24  24  23   '$ Calcium 8.9 - 10.3 mg/dL 9.2  9.6  9.1   Total Protein 6.5 - 8.1 g/dL 7.6  7.6  7.4   Total Bilirubin 0.3 - 1.2 mg/dL 0.7  0.6  0.3   Alkaline Phos 38 - 126 U/L 56  63  52   AST 15 - 41 U/L '27  24  24   '$ ALT 0 - 44 U/L '21  20  22      '$ No results found for: "CEA1", "CEA" / No results found for: "CEA1", "CEA" No results found for: "PSA1" No results found for: "[id number]" Lab Results  Component Value Date   [id number] 29.8 08/18/2022    No results found for: "TOTALPROTELP", "ALBUMINELP", "A1GS", "A2GS", "BETS", "BETA2SER", "GAMS", "MSPIKE", "SPEI" Lab Results  Component Value Date   TIBC 444 05/29/2020   FERRITIN 40 05/29/2020   IRONPCTSAT 17 05/29/2020   No results found for: "LDH"   STUDIES:   CT Abdomen Pelvis W Contrast  Result Date: 08/19/2022 CLINICAL DATA:  History of ovarian cancer, follow-up right ovarian cancer. * Tracking Code: BO * EXAM: CT ABDOMEN AND PELVIS WITH CONTRAST TECHNIQUE: Multidetector CT imaging of the abdomen and pelvis was performed using the standard protocol following bolus administration of intravenous contrast. RADIATION DOSE REDUCTION: This exam was performed according to the departmental dose-optimization program which includes automated exposure control, adjustment of the mA and/or kV according to patient size and/or use  of iterative reconstruction technique. CONTRAST:  163m OMNIPAQUE IOHEXOL 300 MG/ML  SOLN COMPARISON:  Multiple priors including most recent CT February 04, 2022 FINDINGS: Lower chest: No acute abnormality. Hepatobiliary: No suspicious hepatic lesion. Gallbladder is surgically absent. No biliary ductal dilation. Pancreas: No pancreatic ductal dilation or evidence of acute inflammation. Spleen: No splenomegaly. Adrenals/Urinary Tract: Bilateral adrenal glands are within normal limits. No hydronephrosis. Kidneys demonstrate symmetric enhancement and excretion of contrast material. Urinary bladder is unremarkable for degree of distension. Stomach/Bowel: Wall thickening versus underdistention of the gastric antrum on image 34/2. Radiopaque enteric contrast material traverses the rectum. No pathologic dilation of small or large bowel. Sigmoid colonic diverticulosis without findings of acute diverticulitis. No evidence of acute bowel inflammation. Vascular/Lymphatic: Normal caliber abdominal aorta. Smooth IVC contours. Aortic atherosclerosis. The portal, splenic and superior mesenteric veins are patent. No pathologically enlarged abdominal or pelvic lymph nodes. Reproductive: Status post hysterectomy and oophorectomy without enhancing soft tissue nodularity along the vaginal cuff. No suspicious adnexal mass. Other: No significant abdominopelvic free fluid. No discrete peritoneal or omental nodularity Musculoskeletal: No aggressive lytic or blastic lesion of bone. Advanced multilevel degenerative change of the spine with a change grade 2 L4 on L5 anterolisthesis. IMPRESSION: 1. Status post hysterectomy and oophorectomy without evidence of local recurrence or abdominopelvic metastatic disease. 2. Wall thickening versus underdistention of the gastric antrum, correlate for symptoms of gastritis and consider further evaluation with upper endoscopy. 3. Sigmoid colonic diverticulosis without findings of acute diverticulitis. 4.   Aortic Atherosclerosis (ICD10-I70.0). Electronically Signed   By: JDahlia BailiffM.D.   On: 08/19/2022 13:55

## 2022-09-01 NOTE — Patient Instructions (Addendum)
[facility] [place]  Discharge Instructions  You were seen and examined today by Dr. Katragadda.  Dr. Katragadda discussed your most recent lab work and CT scan which revealed that everything looks good.  Follow-up as scheduled in 6 months with labs.    Thank you for choosing [facility] [place] to provide your oncology and hematology care.   To afford each patient quality time with our provider, please arrive at least 15 minutes before your scheduled appointment time. You may need to reschedule your appointment if you arrive late (10 or more minutes). Arriving late affects you and other patients whose appointments are after yours.  Also, if you miss three or more appointments without notifying the office, you may be dismissed from the clinic at the provider's discretion.    Again, thank you for choosing [facility].  Our hope is that these requests will decrease the amount of time that you wait before being seen by our physicians.   If you have a lab appointment with the Cancer Center please come in thru the Main Entrance and check in at the main information desk.           _____________________________________________________________  Should you have questions after your visit to  Cancer Center, please contact our office at [phone] and follow the prompts.  Our office hours are 8:00 a.m. to 4:30 p.m. Monday - Thursday and 8:00 a.m. to 2:30 p.m. Friday.  Please note that voicemails left after 4:00 p.m. may not be returned until the following business day.  We are closed weekends and all major holidays.  You do have access to a nurse 24-7, just call the main number to the clinic [phone] and do not press any options, hold on the line and a nurse will answer the phone.    For prescription refill requests, have your pharmacy contact our office and allow 72 hours.    Masks are optional in the cancer centers. If you would  like for your care team to wear a mask while they are taking care of you, please let them know. You may have one support person who is at least 77 years old accompany you for your appointments.  

## 2022-09-04 DIAGNOSIS — I10 Essential (primary) hypertension: Secondary | ICD-10-CM | POA: Diagnosis not present

## 2022-09-04 DIAGNOSIS — E7849 Other hyperlipidemia: Secondary | ICD-10-CM | POA: Diagnosis not present

## 2022-09-04 DIAGNOSIS — K2100 Gastro-esophageal reflux disease with esophagitis, without bleeding: Secondary | ICD-10-CM | POA: Diagnosis not present

## 2022-09-04 DIAGNOSIS — R5382 Chronic fatigue, unspecified: Secondary | ICD-10-CM | POA: Diagnosis not present

## 2022-09-12 DIAGNOSIS — I4891 Unspecified atrial fibrillation: Secondary | ICD-10-CM | POA: Diagnosis not present

## 2022-09-12 DIAGNOSIS — M79671 Pain in right foot: Secondary | ICD-10-CM | POA: Diagnosis not present

## 2022-09-12 DIAGNOSIS — E7849 Other hyperlipidemia: Secondary | ICD-10-CM | POA: Diagnosis not present

## 2022-09-12 DIAGNOSIS — D500 Iron deficiency anemia secondary to blood loss (chronic): Secondary | ICD-10-CM | POA: Diagnosis not present

## 2022-09-12 DIAGNOSIS — I10 Essential (primary) hypertension: Secondary | ICD-10-CM | POA: Diagnosis not present

## 2022-09-12 DIAGNOSIS — G620 Drug-induced polyneuropathy: Secondary | ICD-10-CM | POA: Diagnosis not present

## 2022-09-12 DIAGNOSIS — Z23 Encounter for immunization: Secondary | ICD-10-CM | POA: Diagnosis not present

## 2022-09-12 DIAGNOSIS — K922 Gastrointestinal hemorrhage, unspecified: Secondary | ICD-10-CM | POA: Diagnosis not present

## 2022-09-26 DIAGNOSIS — M79671 Pain in right foot: Secondary | ICD-10-CM | POA: Diagnosis not present

## 2022-09-26 DIAGNOSIS — M79673 Pain in unspecified foot: Secondary | ICD-10-CM | POA: Diagnosis not present

## 2022-09-26 DIAGNOSIS — M1810 Unilateral primary osteoarthritis of first carpometacarpal joint, unspecified hand: Secondary | ICD-10-CM | POA: Diagnosis not present

## 2022-10-10 DIAGNOSIS — M2021 Hallux rigidus, right foot: Secondary | ICD-10-CM | POA: Diagnosis not present

## 2022-10-10 DIAGNOSIS — M545 Low back pain, unspecified: Secondary | ICD-10-CM | POA: Diagnosis not present

## 2022-10-10 DIAGNOSIS — M79671 Pain in right foot: Secondary | ICD-10-CM | POA: Diagnosis not present

## 2022-10-16 ENCOUNTER — Inpatient Hospital Stay: Payer: Medicare Other | Admitting: Gynecologic Oncology

## 2022-10-17 DIAGNOSIS — M5451 Vertebrogenic low back pain: Secondary | ICD-10-CM | POA: Diagnosis not present

## 2022-10-21 DIAGNOSIS — I4891 Unspecified atrial fibrillation: Secondary | ICD-10-CM | POA: Diagnosis not present

## 2022-10-21 DIAGNOSIS — E782 Mixed hyperlipidemia: Secondary | ICD-10-CM | POA: Diagnosis not present

## 2022-10-28 ENCOUNTER — Telehealth: Payer: Self-pay | Admitting: Cardiology

## 2022-10-28 NOTE — Telephone Encounter (Signed)
Patient c/o Palpitations:  High priority if patient c/o lightheadedness, shortness of breath, or chest pain  How long have you had palpitations/irregular HR/ Afib? Are you having the symptoms now? yes  Are you currently experiencing lightheadedness, SOB or CP? SOB  Do you have a history of afib (atrial fibrillation) or irregular heart rhythm? yes  Have you checked your BP or HR? (document readings if available): 137/84 HR 129: 112/72; 103/77 HR 128  Are you experiencing any other symptoms? Tiredness

## 2022-10-28 NOTE — Telephone Encounter (Signed)
Agree with your assessment and plan  J Jailani Hogans MD 

## 2022-10-28 NOTE — Telephone Encounter (Signed)
Reports being in A-Fib since this patst Saturday AM that started while asleep. Felt heart racing and HR was 140 bpm. Reports she took diltiazem 30 mg x's 1 and then went back to sleep. Reports when she woke up, HR was 120. Says she took another diltiazem. Reports taking diltiazem at least 5 times since Saturday. Reports SOB and feels heart racing. Denies chest pain or dizziness. Today BP 137/84 & HR 140. Reports these are resting numbers. Reports she has not taken any diltiazem today. Reports she has been staying well hydrated including drinking Gatorade. Advised that she should drink water vs gatorade unless she is dehydrated. Medications reviewed. Advised to go ahead and take diltiazem 30 mg and every 4 hours as needed for HR>100. Gave appointment to see [facility] 10/31/2022 @8 :20 am [place] office. Advised if she develops worsening symptoms, to go to the ED for an evaluation. Verbalized understanding of plan.

## 2022-10-30 DIAGNOSIS — M533 Sacrococcygeal disorders, not elsewhere classified: Secondary | ICD-10-CM | POA: Diagnosis not present

## 2022-10-31 ENCOUNTER — Encounter: Payer: Self-pay | Admitting: Cardiology

## 2022-10-31 ENCOUNTER — Ambulatory Visit: Payer: Medicare Other | Attending: Cardiology | Admitting: Cardiology

## 2022-10-31 VITALS — BP 104/78 | HR 133 | Wt 172.0 lb

## 2022-10-31 DIAGNOSIS — I4819 Other persistent atrial fibrillation: Secondary | ICD-10-CM | POA: Diagnosis not present

## 2022-10-31 MED ORDER — METOPROLOL SUCCINATE ER 100 MG PO TB24: 150.0000 mg | ORAL_TABLET | Freq: Every morning | ORAL | 3 refills | Status: DC

## 2022-10-31 NOTE — Patient Instructions (Signed)
Medication Instructions:  Your physician has recommended you make the following change in your medication:   -Hold Amlodipine -Hold Lisinopril -On 11/01/2022 increase Toprol XL to 150 mg tablets once daily  *If you need a refill on your cardiac medications before your next appointment, please call your pharmacy*   Lab Work: None If you have labs (blood work) drawn today and your tests are completely normal, you will receive your results only by: MyChart Message (if you have MyChart) OR A paper copy in the mail If you have any lab test that is abnormal or we need to change your treatment, we will call you to review the results.   Testing/Procedures: None   Follow-Up: At [facility], you and your health needs are our priority.  As part of our continuing mission to provide you with exceptional heart care, we have created designated Provider Care Teams.  These Care Teams include your primary Cardiologist (physician) and Advanced Practice Providers (APPs -  Physician Assistants and Nurse Practitioners) who all work together to provide you with the care you need, when you need it.  We recommend signing up for the patient portal called "MyChart".  Sign up information is provided on this After Visit Summary.  MyChart is used to connect with patients for Virtual Visits (Telemedicine).  Patients are able to view lab/test results, encounter notes, upcoming appointments, etc.  Non-urgent messages can be sent to your provider as well.   To learn more about what you can do with MyChart, go to [email or web address].    Your next appointment:    Keep your July appointment.   Other Instructions Nurse Appointment on Monday for EKG/VItals

## 2022-10-31 NOTE — Progress Notes (Signed)
Clinical Summary April Guerra is a 77 y.o.female seen today for follow up of the following medical problems. This is a focused visit on recent issues with afib  1.PAF - history of prior DCCV 05/01/2018 in setting of newly diagnosed afib, deemed to be of recent onset within hours to presentation - ER visit 04/2019 recurrent afib     03/22/22 ER visit with afib RVR - started on cardizem gtt, from note was paroxysmal while in ER    - ongoing palpitations since Friday night - compliant with meds - has not missed eliquis doses     Other medical problems not addressed this visit   2. LE edema - edema is improving - has prn lasix, has not needed       3.HTN - home bp's 130s-140s/70s - frequent urination, would avoid diuretic         3. Hyperlipidemia   - she is on simvastatin, reports dose lowered to 20mg  daily about 4 months ago. Symptoms no better.  - history of neuropathy, carpal tunnel   08/2021 TC 188 TG 115 HDL 60 LDL 108     She reports normal cath 2015, complicated by pseudoaneurysm required a surgery     SH: former nurse. Husband Alaia Kapoor is also a patient of mine.  Past Medical History:  Diagnosis Date   A-fib (HCC)    Anxiety    Arthritis    Asthma    hx of    Cancer (HCC)    ovarian   Dyspnea    with exertion    H/O seasonal allergies    Heart murmur    as aa child    Hypercholesteremia    Hypertension    Spinal stenosis      Allergies  Allergen Reactions   Morphine And Related Nausea And Vomiting   Zofran [Ondansetron]     Causes minor constipation, tolerates low doses       Current Outpatient Medications  Medication Sig Dispense Refill   acetaminophen (TYLENOL) 500 MG tablet Take 1,000 mg by mouth 3 (three) times daily.     albuterol (VENTOLIN HFA) 108 (90 Base) MCG/ACT inhaler Inhale 2 puffs into the lungs every 4 (four) hours as needed for wheezing or shortness of breath. 18 g 2   amLODipine (NORVASC) 10 MG tablet Take 1 tablet  (10 mg total) by mouth daily with lunch. For BP 30 tablet 3   apixaban (ELIQUIS) 5 MG TABS tablet Take 1 tablet (5 mg total) by mouth 2 (two) times daily. 180 tablet 1   b complex vitamins capsule Take 1 capsule by mouth at bedtime.     Calcium Carb-Cholecalciferol (CALCIUM 600/VITAMIN D PO) Take 600 mg by mouth in the morning and at bedtime.     cetirizine (ZYRTEC) 10 MG tablet Take 10 mg by mouth daily.     Cholecalciferol (VITAMIN D) 125 MCG (5000 UT) CAPS Take 5,000 Units by mouth daily.     Coenzyme Q10 (COQ10) 100 MG CAPS Take 100 mg by mouth at bedtime.      cyclobenzaprine (FLEXERIL) 10 MG tablet Take 5 mg by mouth See admin instructions. Take 5 mg at night, may take a second 5 mg dose during the day as needed for muscle spasms     diltiazem (CARDIZEM) 30 MG tablet Take 1 tablet every 4 hours AS NEEDED for AFIB heart rate >100 30 tablet 3   fluticasone (FLONASE) 50 MCG/ACT nasal spray Place 1 spray  into both nostrils daily.      furosemide (LASIX) 20 MG tablet TAKE 1 TABLET BY MOUTH DAILY AS  NEEDED FOR SWELLING 100 tablet 2   gabapentin (NEURONTIN) 300 MG capsule Take 300 mg by mouth 3 (three) times daily.     lisinopril (ZESTRIL) 30 MG tablet Take 1 tablet (30 mg total) by mouth in the morning. Okay to restart around Monday, 05/14/2020 if blood pressure stable (Patient taking differently: Take 30 mg by mouth in the morning.) 30 tablet 3   Magnesium 100 MG TABS Take 50 mg by mouth at bedtime.     melatonin 5 MG TABS Take 2.5 mg by mouth at bedtime as needed (Sleep).     metoprolol succinate (TOPROL-XL) 100 MG 24 hr tablet Take 100 mg by mouth in the morning. Take with or immediately following a meal.     Multiple Vitamins-Minerals (CENTRUM SILVER ADULT 50+ PO) Take 1 tablet by mouth daily.     Omega-3 Fatty Acids (FISH OIL) 1000 MG CAPS Take 1,000 mg by mouth daily.     ondansetron (ZOFRAN) 4 MG tablet Take 1 tablet (4 mg total) by mouth every 6 (six) hours as needed for nausea. 20 tablet  0   pantoprazole (PROTONIX) 40 MG tablet Take 1 tablet (40 mg total) by mouth daily. 90 tablet 3   polyethylene glycol powder (GLYCOLAX/MIRALAX) 17 GM/SCOOP powder Take 17 g by mouth daily as needed for mild constipation or moderate constipation (With lunch).     Polyvinyl Alcohol-Povidone PF (REFRESH) 1.4-0.6 % SOLN Apply 1 drop to eye as needed.     simvastatin (ZOCOR) 20 MG tablet Take 20 mg by mouth at bedtime.     sodium chloride (OCEAN) 0.65 % SOLN nasal spray Place 1 spray into both nostrils See admin instructions. Use 1 spray in each nostril in the morning may use a second dose at night as needed for congestion     triamcinolone (KENALOG) 0.1 % Apply 1 application topically daily as needed (irritation).     vitamin C (ASCORBIC ACID) 500 MG tablet Take 1,000 mg by mouth 2 (two) times daily.     Vitamins A & D (VITAMIN A & D) ointment Apply 1 application topically daily as needed for dry skin (irritation).     No current facility-administered medications for this visit.     Past Surgical History:  Procedure Laterality Date   ABDOMINAL HYSTERECTOMY     BIOPSY  05/08/2020   Procedure: BIOPSY;  Surgeon: Dolores Frame, MD;  Location: [facility];  Service: Gastroenterology;;   BREAST SURGERY     CARDIAC CATHETERIZATION     CHOLECYSTECTOMY     COLONOSCOPY N/A 08/14/2015   Procedure: COLONOSCOPY;  Surgeon: Franky Macho, MD;  Location: [facility];  Service: Gastroenterology;  Laterality: N/A;   ESOPHAGOGASTRODUODENOSCOPY (EGD) WITH PROPOFOL N/A 05/08/2020   Procedure: ESOPHAGOGASTRODUODENOSCOPY (EGD) WITH PROPOFOL;  Surgeon: Dolores Frame, MD;  Location: [facility];  Service: Gastroenterology;  Laterality: N/A;   ESOPHAGOGASTRODUODENOSCOPY (EGD) WITH PROPOFOL N/A 08/07/2020   Procedure: ESOPHAGOGASTRODUODENOSCOPY (EGD) WITH PROPOFOL;  Surgeon: Dolores Frame, MD;  Location: [facility];  Service: Gastroenterology;  Laterality: N/A;  7:30   IR  IMAGING GUIDED PORT INSERTION  06/04/2020   ROBOTIC ASSISTED TOTAL HYSTERECTOMY WITH BILATERAL SALPINGO OOPHERECTOMY N/A 05/03/2020   Procedure: XI ROBOTIC ASSISTED TOTAL HYSTERECTOMY WITH BILATERAL SALPINGO OOPHORECTOMY, OMENTECTOMY,RADICAL TUMOR DEBULKING, RIGID PROSTOSCOPY;  Surgeon: Adolphus Birchwood, MD;  Location: [facility];  Service: Gynecology;  Laterality: N/A;   THROMBECTOMY FEMORAL ARTERY Right 12/17/2013   Procedure: REPAIR OF RIGHT FEMORAL ARTERY PSEUDOANEURYSM;  Surgeon: Sherren Kerns, MD;  Location: [facility] OR;  Service: Vascular;  Laterality: Right;     Allergies  Allergen Reactions   Morphine And Related Nausea And Vomiting   Zofran [Ondansetron]     Causes minor constipation, tolerates low doses        Family History  Problem Relation Age of Onset   Hypertension Mother    Heart failure Mother    Asthma Mother    Ulcers Mother    Benign prostatic hyperplasia Father    Ulcers Father    Esophageal varices Father    Atrial fibrillation Sister    Atrial fibrillation Brother    Heart disease Brother    Melanoma Maternal Aunt    Colon cancer Neg Hx    Breast cancer Neg Hx    Ovarian cancer Neg Hx    Endometrial cancer Neg Hx    Pancreatic cancer Neg Hx    Prostate cancer Neg Hx      Social History Ms. Wynes reports that she has never smoked. She has never used smokeless tobacco. Ms. Ussery reports no history of alcohol use.   Review of Systems CONSTITUTIONAL: No weight loss, fever, chills, weakness or fatigue.  HEENT: Eyes: No visual loss, blurred vision, double vision or yellow sclerae.No hearing loss, sneezing, congestion, runny nose or sore throat.  SKIN: No rash or itching.  CARDIOVASCULAR: per hpi RESPIRATORY: No shortness of breath, cough or sputum.  GASTROINTESTINAL: No anorexia, nausea, vomiting or diarrhea. No abdominal pain or blood.  GENITOURINARY: No burning on urination, no polyuria NEUROLOGICAL: No headache, dizziness, syncope, paralysis, ataxia, numbness  or tingling in the extremities. No change in bowel or bladder control.  MUSCULOSKELETAL: No muscle, back pain, joint pain or stiffness.  LYMPHATICS: No enlarged nodes. No history of splenectomy.  PSYCHIATRIC: No history of depression or anxiety.  ENDOCRINOLOGIC: No reports of sweating, cold or heat intolerance. No polyuria or polydipsia.  Marland Kitchen   Physical Examination Today's Vitals   10/31/22 0818  BP: 104/78  Pulse: (!) 133  SpO2: 98%  Weight: 172 lb (78 kg)   Body mass index is 34.74 kg/m.  Gen: resting comfortably, no acute distress HEENT: no scleral icterus, pupils equal round and reactive, no palptable cervical adenopathy,  CV: irreg, no m/r/g no jvd Resp: Clear to auscultation bilaterally GI: abdomen is soft, non-tender, non-distended, normal bowel sounds, no hepatosplenomegaly MSK: extremities are warm, no edema.  Skin: warm, no rash Neuro:  no focal deficits Psych: appropriate affect   Diagnostic Studies 04/2018 echo   Study Conclusions   - Left ventricle: The cavity size was normal. Wall thickness was    increased in a pattern of moderate LVH. Systolic function was    normal. The estimated ejection fraction was in the range of 60%    to 65%. Wall motion was normal; there were no regional wall    motion abnormalities. Left ventricular diastolic function    parameters were normal.  - Mitral valve: Mildly thickened leaflets . There was mild    regurgitation.  - Left atrium: The atrium was mildly dilated.  - Right atrium: The atrium was at the upper limits of normal in    size.  - Inferior vena cava: The vessel was normal in size. The    respirophasic diameter changes were in the normal range (>= 50%),    consistent with normal central  venous pressure.           Assessment and Plan   1.AFib/acquired thrombophilia - persistent afib over the last week, EKG today shows afib rate 120s - hold norvasc and lisinopril, increase her toprol to 150mg  daily. Continue  prn diltiazem - come for nursing visit Monday for EKG and vitals, pending results may consider DCCV - discussed if significant symptoms over the weekend could present to ER for DCCV. She has not missed any doses of eliquis within 3 weeks and would be suitable for cardioversion        Antoine Poche, M.D.

## 2022-11-02 ENCOUNTER — Other Ambulatory Visit: Payer: Self-pay

## 2022-11-02 ENCOUNTER — Encounter (HOSPITAL_COMMUNITY): Payer: Self-pay | Admitting: Emergency Medicine

## 2022-11-02 ENCOUNTER — Emergency Department (HOSPITAL_COMMUNITY): Payer: Medicare Other

## 2022-11-02 ENCOUNTER — Emergency Department (HOSPITAL_COMMUNITY)
Admission: EM | Admit: 2022-11-02 | Discharge: 2022-11-02 | Disposition: A | Discharge status: Home / Self Care | Payer: Medicare Other | Attending: Emergency Medicine | Admitting: Emergency Medicine

## 2022-11-02 DIAGNOSIS — Z7901 Long term (current) use of anticoagulants: Secondary | ICD-10-CM | POA: Insufficient documentation

## 2022-11-02 DIAGNOSIS — I4891 Unspecified atrial fibrillation: Secondary | ICD-10-CM | POA: Diagnosis not present

## 2022-11-02 DIAGNOSIS — R000 Tachycardia, unspecified: Secondary | ICD-10-CM | POA: Diagnosis not present

## 2022-11-02 LAB — CBC
HCT: 37.2 % (ref 36.0–46.0)
Hemoglobin: 12.9 g/dL (ref 12.0–15.0)
MCH: 33.6 pg (ref 26.0–34.0)
MCHC: 34.7 g/dL (ref 30.0–36.0)
MCV: 96.9 fL (ref 80.0–100.0)
Platelets: 314 10*3/uL (ref 150–400)
RBC: 3.84 MIL/uL — ABNORMAL LOW (ref 3.87–5.11)
RDW: 12.7 % (ref 11.5–15.5)
WBC: 9.5 10*3/uL (ref 4.0–10.5)
nRBC: 0 % (ref 0.0–0.2)

## 2022-11-02 LAB — BASIC METABOLIC PANEL
Anion gap: 11 (ref 5–15)
BUN: 20 mg/dL (ref 8–23)
CO2: 22 mmol/L (ref 22–32)
Calcium: 9.1 mg/dL (ref 8.9–10.3)
Chloride: 102 mmol/L (ref 98–111)
Creatinine, Ser: 0.91 mg/dL (ref 0.44–1.00)
GFR, Estimated: >60 mL/min (ref >60)
Glucose, Bld: 97 mg/dL (ref 70–99)
Potassium: 3.6 mmol/L (ref 3.5–5.1)
Sodium: 135 mmol/L (ref 135–145)

## 2022-11-02 LAB — MAGNESIUM: Magnesium: 2.1 mg/dL (ref 1.7–2.4)

## 2022-11-02 MED ORDER — ETOMIDATE 2 MG/ML IV SOLN
5.0000 mg | Freq: Once | INTRAVENOUS | Status: AC
  Administered 2022-11-02: 5 mg via INTRAVENOUS
  Filled 2022-11-02: qty 10

## 2022-11-02 MED ORDER — HEPARIN SOD (PORK) LOCK FLUSH 100 UNIT/ML IV SOLN
500.0000 [IU] | Freq: Once | INTRAVENOUS | Status: AC
  Administered 2022-11-02: 500 [IU] via INTRAVENOUS
  Filled 2022-11-02: qty 5

## 2022-11-02 NOTE — ED Triage Notes (Signed)
Patient presents to ED via POV, c/o shortness of breath and heart palpitation. Patient reports she was seen by her cardiologist on Friday diagnosed with A-Fib and tachycardia. Continues to have  shortness of breath.

## 2022-11-02 NOTE — ED Provider Notes (Addendum)
[facility] EMERGENCY DEPARTMENT AT [facility] Provider Note   CSN: [id number] Arrival date & time: 11/02/22  1854     History  Chief Complaint  Patient presents with   Atrial Fibrillation    April Guerra is a 77 y.o. female.   Atrial Fibrillation  Patient presents with atrial fibrillation.  Shortness of breath.  Has been seen by cardiology 2 days ago.  A week before that went back into A-fib.  Has a history of A-fib with RVR.  Had some adjustment of her medications.  I reviewed the note by Dr. Wyline Mood.  Is on anticoagulation has been compliant without missing any doses for a long time but specifically not the last 3 weeks.  Has some edema on her legs.  No coughing.  No chest pain.     Home Medications Prior to Admission medications   Medication Sig Start Date End Date Taking? Authorizing Provider  acetaminophen (TYLENOL) 500 MG tablet Take 1,000 mg by mouth 3 (three) times daily.    [provider]  albuterol (VENTOLIN HFA) 108 (90 Base) MCG/ACT inhaler Inhale 2 puffs into the lungs every 4 (four) hours as needed for wheezing or shortness of breath. 05/09/20   Shon Hale, MD  amLODipine (NORVASC) 10 MG tablet Take 1 tablet (10 mg total) by mouth daily with lunch. For BP 05/09/20   Emokpae, Courage, MD  apixaban (ELIQUIS) 5 MG TABS tablet Take 1 tablet (5 mg total) by mouth 2 (two) times daily. 03/24/22   Antoine Poche, MD  Calcium Carb-Cholecalciferol (CALCIUM 600/VITAMIN D PO) Take 600 mg by mouth in the morning and at bedtime.    [provider]  cetirizine (ZYRTEC) 10 MG tablet Take 10 mg by mouth daily.    [provider]  Cholecalciferol (VITAMIN D) 125 MCG (5000 UT) CAPS Take 5,000 Units by mouth daily.    [provider]  Coenzyme Q10 (COQ10) 100 MG CAPS Take 100 mg by mouth at bedtime.     [provider]  cyclobenzaprine (FLEXERIL) 10 MG tablet Take 5 mg by mouth See admin instructions. Take 5 mg at night,  may take a second 5 mg dose during the day as needed for muscle spasms 04/22/19   [provider]  diltiazem (CARDIZEM) 30 MG tablet Take 1 tablet every 4 hours AS NEEDED for AFIB heart rate >100 01/22/22   Antoine Poche, MD  fluticasone (FLONASE) 50 MCG/ACT nasal spray Place 1 spray into both nostrils daily.  11/11/19   [provider]  furosemide (LASIX) 20 MG tablet TAKE 1 TABLET BY MOUTH DAILY AS  NEEDED FOR SWELLING 06/02/22   Branch, Dorothe Pea, MD  gabapentin (NEURONTIN) 300 MG capsule Take 300 mg by mouth 3 (three) times daily.    [provider]  lisinopril (ZESTRIL) 30 MG tablet Take 1 tablet (30 mg total) by mouth in the morning. Okay to restart around Monday, 05/14/2020 if blood pressure stable Patient taking differently: Take 30 mg by mouth in the morning. 05/14/20   Shon Hale, MD  Magnesium 100 MG TABS Take 50 mg by mouth at bedtime.    [provider]  melatonin 5 MG TABS Take 2.5 mg by mouth at bedtime as needed (Sleep).    [provider]  metoprolol succinate (TOPROL-XL) 100 MG 24 hr tablet Take 1.5 tablets (150 mg total) by mouth in the morning. Take with or immediately following a meal. 10/31/22   Branch, Dorothe Pea, MD  Multiple Vitamins-Minerals (CENTRUM SILVER ADULT 50+ PO) Take 1 tablet by mouth daily.    [provider]  Omega-3 Fatty Acids (FISH OIL) 1000 MG CAPS Take 1,000 mg by mouth daily.    [provider]  ondansetron (ZOFRAN) 4 MG tablet Take 1 tablet (4 mg total) by mouth every 6 (six) hours as needed for nausea. Patient not taking: Reported on 10/31/2022 05/09/20   Shon Hale, MD  pantoprazole (PROTONIX) 40 MG tablet Take 1 tablet (40 mg total) by mouth daily. 08/07/20 01/23/79  Dolores Frame, MD  polyethylene glycol powder (GLYCOLAX/MIRALAX) 17 GM/SCOOP powder Take 17 g by mouth daily as needed for mild constipation or moderate constipation (With lunch).    [provider]   Polyvinyl Alcohol-Povidone PF (REFRESH) 1.4-0.6 % SOLN Apply 1 drop to eye as needed.    [provider]  simvastatin (ZOCOR) 20 MG tablet Take 20 mg by mouth at bedtime. 05/28/21   [provider]  sodium chloride (OCEAN) 0.65 % SOLN nasal spray Place 1 spray into both nostrils See admin instructions. Use 1 spray in each nostril in the morning may use a second dose at night as needed for congestion    [provider]  triamcinolone (KENALOG) 0.1 % Apply 1 application topically daily as needed (irritation). 05/22/20   [provider]  vitamin C (ASCORBIC ACID) 500 MG tablet Take 1,000 mg by mouth 2 (two) times daily.    [provider]  Vitamins A & D (VITAMIN A & D) ointment Apply 1 application topically daily as needed for dry skin (irritation).    [provider]  prochlorperazine (COMPAZINE) 10 MG tablet Take 1 tablet (10 mg total) by mouth every 6 (six) hours as needed (Nausea or vomiting). 05/29/20 12/14/21  Serena Croissant, MD      Allergies    Morphine and related and Zofran [ondansetron]    Review of Systems   Review of Systems  Physical Exam Updated Vital Signs BP (!) 156/85 (BP Location: Left Arm)   Pulse 79   Temp 97.8 F (36.6 C) (Oral)   Resp (!) 22   Ht 4\' 11"  (1.499 m)   Wt 78 kg   SpO2 100%   BMI 34.74 kg/m  Physical Exam Vitals and nursing note reviewed.  HENT:     Head: Normocephalic.  Eyes:     Pupils: Pupils are equal, round, and reactive to light.  Cardiovascular:     Rate and Rhythm: Tachycardia present. Rhythm irregular.  Musculoskeletal:     Right lower leg: Edema present.     Left lower leg: Edema present.     Comments: Mild edema bilateral lower extremities.  Skin:    Capillary Refill: Capillary refill takes less than 2 seconds.  Neurological:     Mental Status: She is alert and oriented to person, place, and time.     ED Results / Procedures / Treatments   Labs (all labs ordered are listed,  but only abnormal results are displayed) Labs Reviewed  CBC - Abnormal; Notable for the following components:      Result Value   RBC 3.84 (*)    All other components within normal limits  BASIC METABOLIC PANEL  MAGNESIUM    EKG EKG Interpretation  Date/Time:  Sunday Nov 02 2022 20:11:43 EDT Ventricular Rate:  79 PR Interval:  183 QRS Duration: 90 QT Interval:  384 QTC Calculation: 441 R Axis:   9 Text Interpretation: Sinus rhythm Abnormal R-wave progression,  early transition Left ventricular hypertrophy afib has resolved Confirmed by Benjiman Core [phone]) on 11/02/2022 8:20:19 PM  Radiology DG Chest Port 1 View  Result Date: 11/02/2022 CLINICAL DATA:  Atrial fibrillation.  Tachycardia. EXAM: PORTABLE CHEST 1 VIEW COMPARISON:  Chest radiograph 03/22/2022, 12/20/2013 FINDINGS: Right IJ power injectable port central venous catheter tip projects at the level of the mid SVC. The heart size and mediastinal contours are within normal limits. Bibasilar hazy airspace opacities, right-greater-than-left. Left lung is clear. No pleural effusion or pneumothorax. No acute osseous abnormality. IMPRESSION: Bibasilar hazy airspace opacities, right-greater-than-left, may reflect atelectasis, aspiration changes, or pneumonia in the appropriate clinical context. Electronically Signed   By: Sherron Ales M.D.   On: 11/02/2022 19:56    Procedures .Sedation  Date/Time: 11/02/2022 8:00 PM  Performed by: Benjiman Core, MD Authorized by: Benjiman Core, MD   Consent:    Consent obtained:  Written   Consent given by:  Patient   Risks discussed:  Allergic reaction, dysrhythmia, nausea, inadequate sedation, prolonged hypoxia resulting in organ damage, respiratory compromise necessitating ventilatory assistance and intubation and vomiting   Alternatives discussed:  Analgesia without sedation Universal protocol:    Immediately prior to procedure, a time out was called: yes     Patient identity  confirmed:  Arm band and verbally with patient Indications:    Procedure performed:  Cardioversion Pre-sedation assessment:    Time since last food or drink:  2 hrs   ASA classification: class 2 - patient with mild systemic disease     Mouth opening:  2 finger widths   Thyromental distance:  4 finger widths   Mallampati score:  I - soft palate, uvula, fauces, pillars visible   Neck mobility: normal     Pre-sedation assessments completed and reviewed: airway patency, cardiovascular function, hydration status, mental status, nausea/vomiting, pain level, respiratory function and temperature   Immediate pre-procedure details:    Reassessment: Patient reassessed immediately prior to procedure     Reviewed: vital signs     Verified: bag valve mask available, emergency equipment available, intubation equipment available, IV patency confirmed, oxygen available and suction available   Procedure details (see MAR for exact dosages):    Preoxygenation:  Nasal cannula   Sedation:  Etomidate   Intended level of sedation: deep   Intra-procedure monitoring:  Blood pressure monitoring, frequent LOC assessments, frequent vital sign checks, continuous pulse oximetry and cardiac monitor   Intra-procedure events: none     Total Provider sedation time (minutes):  10 Post-procedure details:    Post-sedation assessment completed:  11/02/2022 8:22 PM   Attendance: Constant attendance by certified staff until patient recovered     Recovery: Patient returned to pre-procedure baseline     Post-sedation assessments completed and reviewed: airway patency, cardiovascular function, mental status, nausea/vomiting, pain level, respiratory function and temperature     Patient is stable for discharge or admission: yes     Procedure completion:  Tolerated well, no immediate complications .Cardioversion  Date/Time: 11/02/2022 8:00 PM  Performed by: Benjiman Core, MD Authorized by: Benjiman Core, MD   Consent:     Consent obtained:  Written   Consent given by:  Patient   Risks discussed:  Cutaneous burn, death, induced arrhythmia and pain   Alternatives discussed:  Rate-control medication and alternative treatment Pre-procedure details:    Cardioversion basis:  Elective   Rhythm:  Atrial fibrillation   Electrode placement:  Anterior-posterior Patient sedated: Yes. Refer to sedation procedure documentation for details of sedation.  Attempt one:    Cardioversion mode:  Synchronous   Waveform:  Biphasic   Shock (Joules):  150   Shock outcome:  Conversion to normal sinus rhythm Post-procedure details:    Patient status:  Awake   Patient tolerance of procedure:  Tolerated well, no immediate complications     Medications Ordered in ED Medications  etomidate (AMIDATE) injection 5 mg (5 mg Intravenous Given 11/02/22 2000)    ED Course/ Medical Decision Making/ A&P                             Medical Decision Making Amount and/or Complexity of Data Reviewed Labs: ordered. Radiology: ordered.  Risk Prescription drug management.   Patient with paroxysmal A-fib.  Has been in A-fib likely since last week Friday.  Has been compliant with her anticoagulation prior to this however.  Seen by cardiology on Friday.  If symptoms do not improve was instructed to come to ER for cardioversion.  Is in A-fib..  Reviewed cardiology notes and previous ER notes.  Overall clinically well-appearing besides the tachycardia.  Does have some mild edema.  X-ray also shows opacities potentially could be mild edema.  Doubt pneumonia without cough or fevers.  Cardioverted successfully back to sinus rhythm.  Feels better and is back to preprocedure baseline.  Appears stable for discharge home.  Follow-up with Dr. Wyline Mood. [facility] 3  CRITICAL CARE Performed by: Benjiman Core Total critical care time: 30 minutes Critical care time was exclusive of separately billable procedures and treating other patients. Critical  care was necessary to treat or prevent imminent or life-threatening deterioration. Critical care was time spent personally by me on the following activities: development of treatment plan with patient and/or surrogate as well as nursing, discussions with consultants, evaluation of patient's response to treatment, examination of patient, obtaining history from patient or surrogate, ordering and performing treatments and interventions, ordering and review of laboratory studies, ordering and review of radiographic studies, pulse oximetry and re-evaluation of patient's condition.       Final Clinical Impression(s) / ED Diagnoses Final diagnoses:  Atrial fibrillation with rapid ventricular response [facility])    Rx / DC Orders ED Discharge Orders     None         Benjiman Core, MD 11/02/22 2024    Benjiman Core, MD 11/26/22 1215

## 2022-11-03 ENCOUNTER — Ambulatory Visit: Payer: Medicare Other | Attending: Cardiology | Admitting: *Deleted

## 2022-11-03 ENCOUNTER — Telehealth: Payer: Self-pay | Admitting: Cardiology

## 2022-11-03 VITALS — BP 158/88 | HR 66

## 2022-11-03 DIAGNOSIS — I480 Paroxysmal atrial fibrillation: Secondary | ICD-10-CM

## 2022-11-03 MED ORDER — METOPROLOL SUCCINATE ER 100 MG PO TB24: 100.0000 mg | ORAL_TABLET | Freq: Every morning | ORAL | Status: DC

## 2022-11-03 MED ORDER — LISINOPRIL 30 MG PO TABS: 30.0000 mg | ORAL_TABLET | Freq: Every morning | ORAL | Status: DC

## 2022-11-03 NOTE — Progress Notes (Addendum)
Patient notified and verbalized understanding.   BP recheck later at home - 132/80  80

## 2022-11-03 NOTE — Progress Notes (Signed)
Patient in office for EKG & vitals.    BP 158/88  66   States that she has not taken her morning medications yet as she was at the ED last evening.    She request you review notes including chest x-ray to see if any changes need to be made.

## 2022-11-03 NOTE — Telephone Encounter (Signed)
Notified.  See nurse visit notes.

## 2022-11-03 NOTE — Progress Notes (Signed)
Probably developed a little fluid in the lungs from being in afib with fast rates for several days, should resolve. If develops cough or fevers would need to consider possible pneumonia as other cause and would need to reach out to pcp  Cardioverted in ER, still in sinus rhythm. Lower her toprol back to 100mg  daily. Can restart her norvasc at 10mg  daily and lisinorpril back at 30mg  daily  Dominga Ferry MD

## 2022-11-03 NOTE — Telephone Encounter (Signed)
Pt c/o medication issue:  1. Name of Medication: amLODipine (NORVASC) 10 MG tablet  metoprolol succinate (TOPROL-XL) 100 MG 24 hr tablet    2. How are you currently taking this medication (dosage and times per day)? Metoprolol 150 mg  Lisinopril   3. Are you having a reaction (difficulty breathing--STAT)? No   4. What is your medication issue? Patient states that she would like to discuss what her next steps are with the medication. Dr. Wyline Mood told her to change medications on Friday 05/10. Please advise.

## 2022-11-03 NOTE — Telephone Encounter (Signed)
Patient had nurse visit in [place] this am at 0830, I will forward to their triage for dispo.

## 2022-11-04 DIAGNOSIS — I4891 Unspecified atrial fibrillation: Secondary | ICD-10-CM | POA: Diagnosis not present

## 2022-11-04 DIAGNOSIS — E7849 Other hyperlipidemia: Secondary | ICD-10-CM | POA: Diagnosis not present

## 2022-11-04 DIAGNOSIS — I10 Essential (primary) hypertension: Secondary | ICD-10-CM | POA: Diagnosis not present

## 2022-11-04 DIAGNOSIS — R5383 Other fatigue: Secondary | ICD-10-CM | POA: Diagnosis not present

## 2022-11-06 IMAGING — MR MR CERVICAL SPINE W/O CM
5 series · 39 of 48 positions shown · non-contrast
Comparison: None.

CLINICAL DATA: Cervical radiculopathy, right arm weakness and pain.
Known malignancy (ovarian cancer).

EXAM:
MRI CERVICAL SPINE WITHOUT CONTRAST
TECHNIQUE: Multiplanar, multisequence MR imaging of the cervical spine was
performed. No intravenous contrast was administered.

[Series 5: T2 · sagittal · 3.0mm · 0.69mm/px · 6 of 15 slices shown (1 of 2)]
[im 1/15]
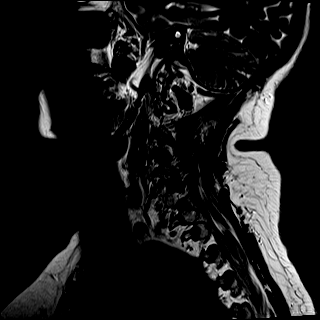
[im 3/15]
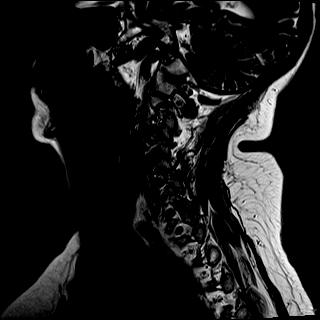
[im 6/15]
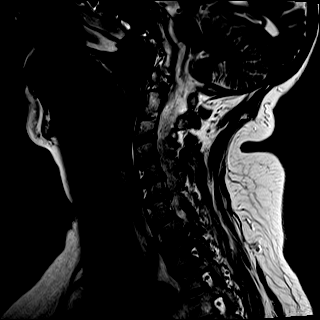
[im 9/15]
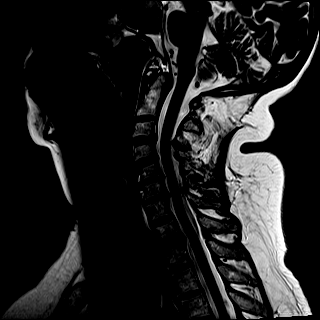
[im 12/15]
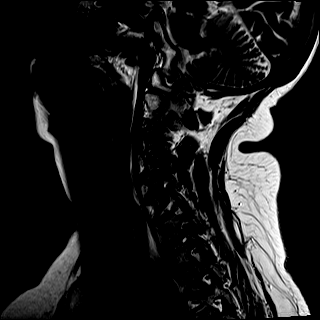
[im 15/15]
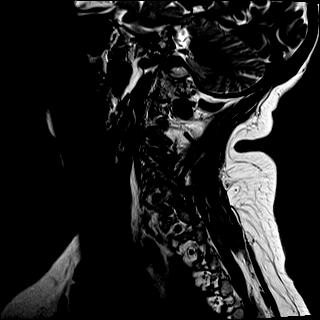

[Series 6: T1 · sagittal · 3.0mm · 0.86mm/px · 7 of 15 slices shown]
[im 1/15]
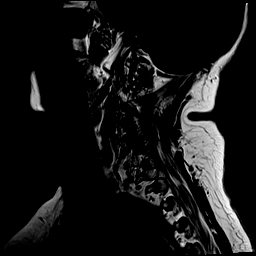
[im 3/15]
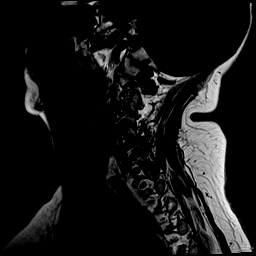
[im 5/15]
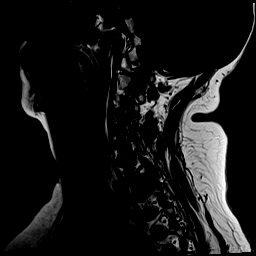
[im 8/15]
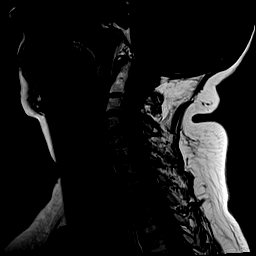
[im 10/15]
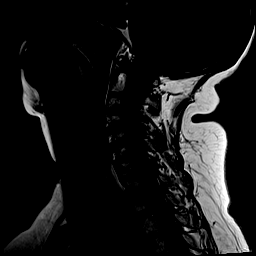
[im 12/15]
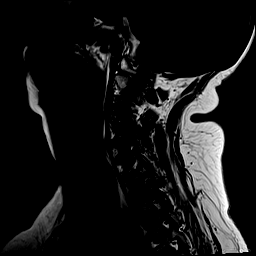
[im 15/15]
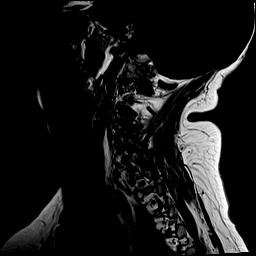

[Series 7: STIR · sagittal · 3.0mm · 0.69mm/px · 7 of 15 slices shown]
[im 1/15]
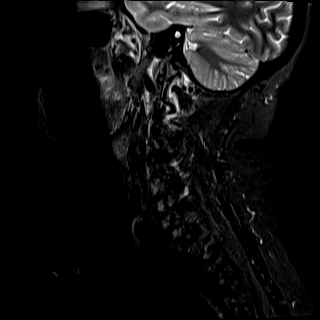
[im 3/15]
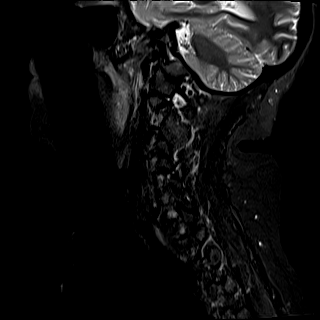
[im 5/15]
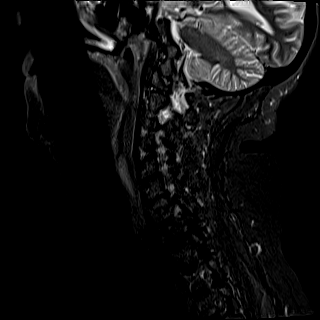
[im 8/15]
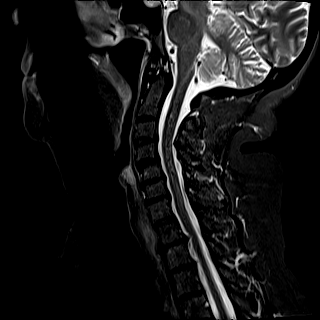
[im 10/15]
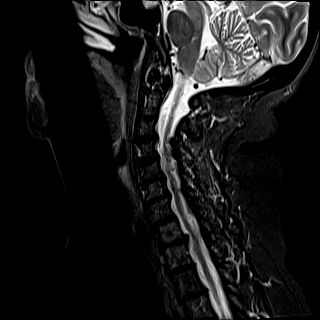
[im 12/15]
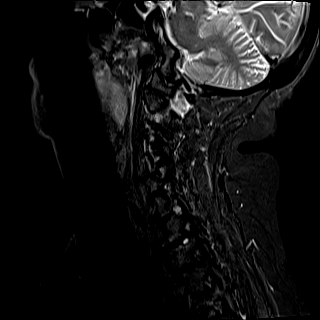
[im 15/15]
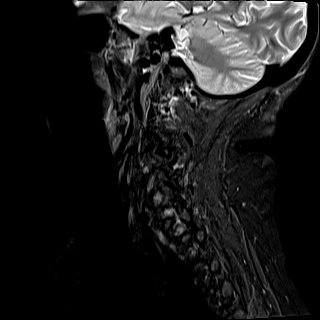

[Series 8: T2 · axial · 3.0mm · 0.70mm/px · z∈[-28,+64]mm · 11 of 30 slices shown (2 of 2)]
[im 1/30]
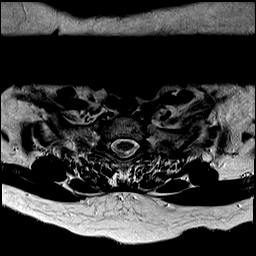
[im 3/30]
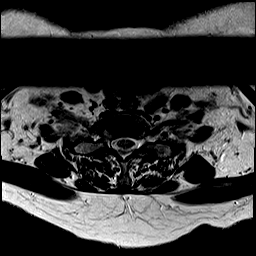
[im 5/30]
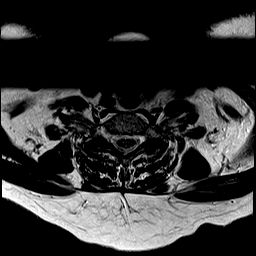
[im 7/30]
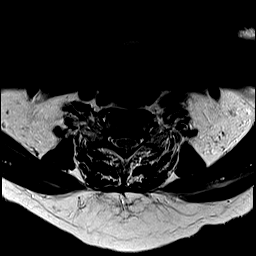
[im 9/30]
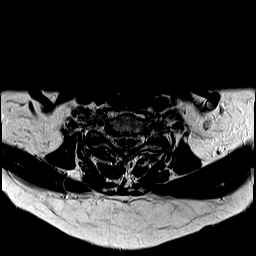
[im 12/30]
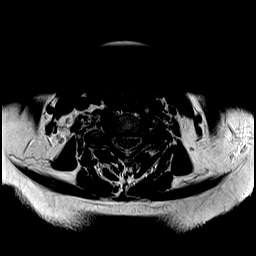
[im 14/30]
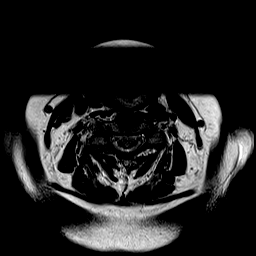
[im 16/30]
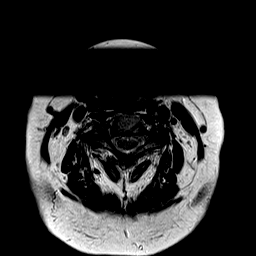
[im 21/30]
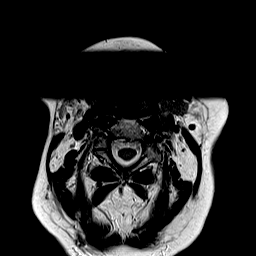
[im 25/30]
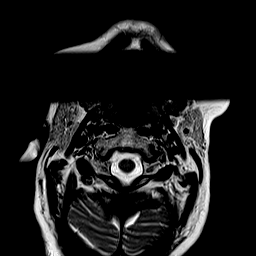
[im 30/30]
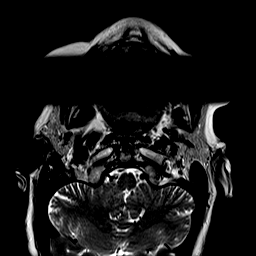

[Series 9: GRE · axial · 3.0mm · 0.35mm/px · z∈[-28,+64]mm · 8 of 30 slices shown]
[im 1/30]
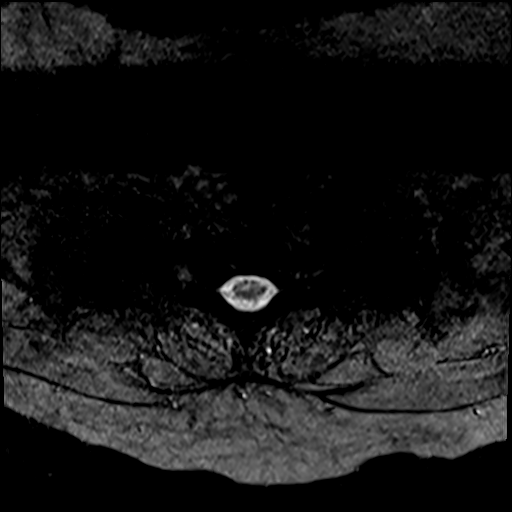
[im 5/30]
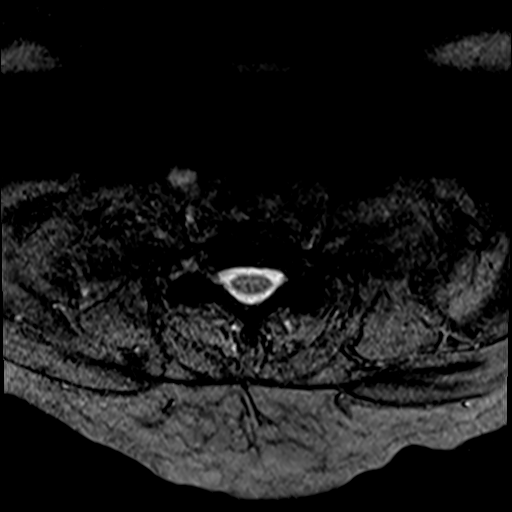
[im 9/30]
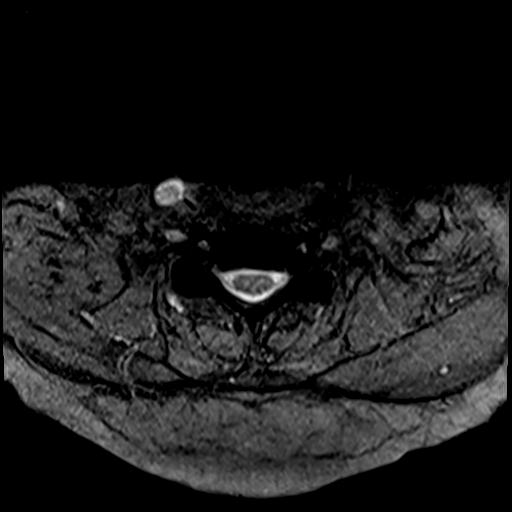
[im 14/30]
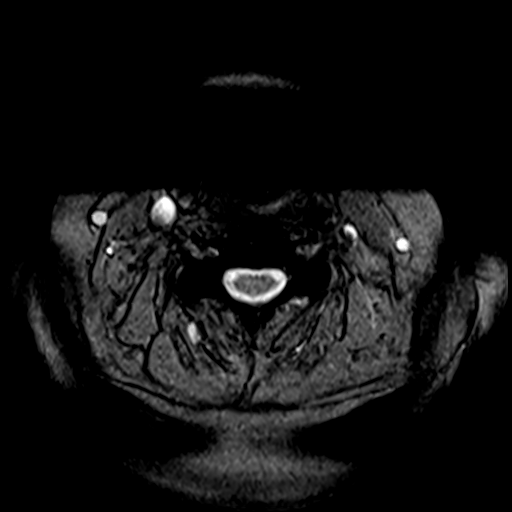
[im 16/30]
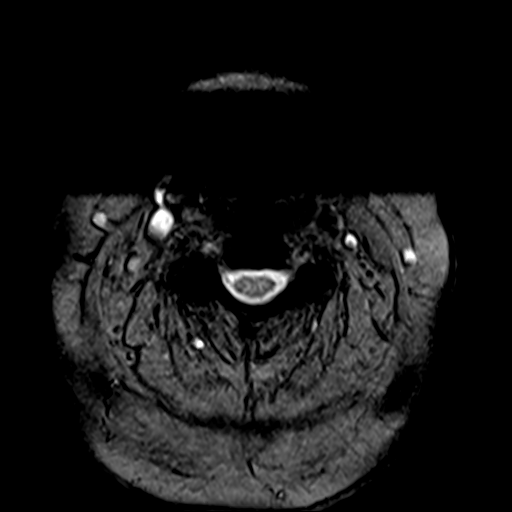
[im 21/30]
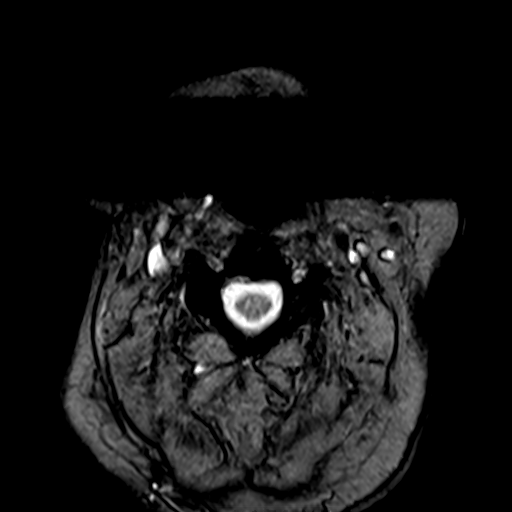
[im 25/30]
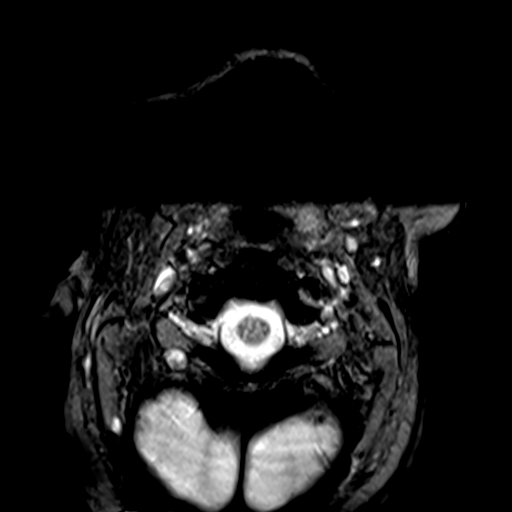
[im 30/30]
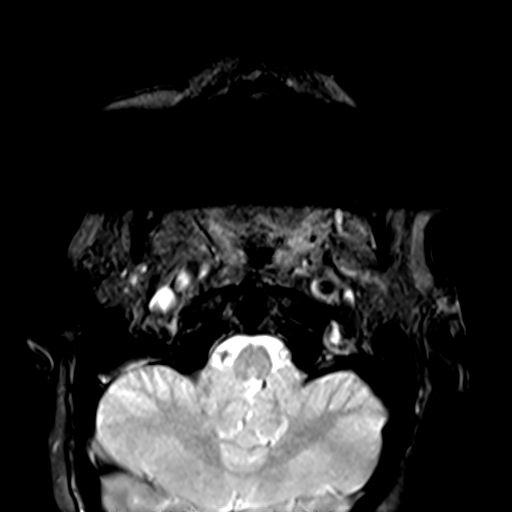

[39 of 48 positions shown; findings below may reference images not displayed]

FINDINGS: Alignment: Mild degenerative anterolisthesis at C4-5 and C7-T1.

Vertebrae: No fracture, evidence of discitis, or bone lesion.

Cord: Normal signal and morphology.

Posterior Fossa, vertebral arteries, paraspinal tissues: Negative
for perispinal mass or inflammation

Disc levels:

C2-3: Mild facet spurring with ankylosis on the left

C3-4: Disc narrowing and bulging with asymmetric right uncovertebral
and facet spurring. Moderate right foraminal impingement. Patent
spinal canal

C4-5: Disc narrowing and bulging uncovertebral and facet spurring.
Patent canal and foramina

C5-6: Disc narrowing and bulging with uncovertebral and facet
spurring. Biforaminal impingement. Patent spinal canal

C6-7: Disc narrowing with uncovertebral and endplate ridging. Mild
bilateral foraminal narrowing and mild spinal stenosis

C7-T1:Mild facet spurring with anterolisthesis. No herniation or
impingement.

T1-2: Disc space narrowing and bulging with biforaminal protrusion.
Exacerbating facet spurs. Biforaminal impingement.
IMPRESSION: 1. Generalized cervical spine degeneration most heavily affecting
the facets with left C2-3 facet ankylosis and mild anterolisthesis
at C4-5 and C7-T1.
2. Degenerative foraminal impingement on the right at C3-4 and
bilaterally at C5-6 and T1-2. On the symptomatic right side
foraminal impingement is greatest at C5-6.
3. Diffusely patent spinal canal.
4. No evidence of bone lesion.

## 2022-11-11 DIAGNOSIS — D485 Neoplasm of uncertain behavior of skin: Secondary | ICD-10-CM | POA: Diagnosis not present

## 2022-11-11 DIAGNOSIS — L570 Actinic keratosis: Secondary | ICD-10-CM | POA: Diagnosis not present

## 2022-11-20 DIAGNOSIS — M533 Sacrococcygeal disorders, not elsewhere classified: Secondary | ICD-10-CM | POA: Diagnosis not present

## 2022-11-21 DIAGNOSIS — E782 Mixed hyperlipidemia: Secondary | ICD-10-CM | POA: Diagnosis not present

## 2022-11-21 DIAGNOSIS — I4891 Unspecified atrial fibrillation: Secondary | ICD-10-CM | POA: Diagnosis not present

## 2022-12-01 DIAGNOSIS — M545 Low back pain, unspecified: Secondary | ICD-10-CM | POA: Diagnosis not present

## 2022-12-04 DIAGNOSIS — M545 Low back pain, unspecified: Secondary | ICD-10-CM | POA: Diagnosis not present

## 2022-12-09 DIAGNOSIS — M545 Low back pain, unspecified: Secondary | ICD-10-CM | POA: Diagnosis not present

## 2022-12-11 DIAGNOSIS — M545 Low back pain, unspecified: Secondary | ICD-10-CM | POA: Diagnosis not present

## 2022-12-16 DIAGNOSIS — M545 Low back pain, unspecified: Secondary | ICD-10-CM | POA: Diagnosis not present

## 2022-12-18 ENCOUNTER — Inpatient Hospital Stay: Payer: Medicare Other | Attending: Gynecologic Oncology | Admitting: Gynecologic Oncology

## 2022-12-18 ENCOUNTER — Inpatient Hospital Stay: Payer: Medicare Other | Admitting: Gynecologic Oncology

## 2022-12-18 ENCOUNTER — Encounter: Payer: Self-pay | Admitting: Gynecologic Oncology

## 2022-12-18 VITALS — BP 180/87 | HR 87 | Temp 98.1°F | Ht <= 58 in | Wt 171.4 lb

## 2022-12-18 DIAGNOSIS — Z90722 Acquired absence of ovaries, bilateral: Secondary | ICD-10-CM | POA: Diagnosis not present

## 2022-12-18 DIAGNOSIS — Z9221 Personal history of antineoplastic chemotherapy: Secondary | ICD-10-CM | POA: Insufficient documentation

## 2022-12-18 DIAGNOSIS — Z08 Encounter for follow-up examination after completed treatment for malignant neoplasm: Secondary | ICD-10-CM | POA: Insufficient documentation

## 2022-12-18 DIAGNOSIS — C561 Malignant neoplasm of right ovary: Secondary | ICD-10-CM

## 2022-12-18 DIAGNOSIS — Z8543 Personal history of malignant neoplasm of ovary: Secondary | ICD-10-CM

## 2022-12-18 DIAGNOSIS — N3941 Urge incontinence: Secondary | ICD-10-CM

## 2022-12-18 DIAGNOSIS — Z90710 Acquired absence of both cervix and uterus: Secondary | ICD-10-CM | POA: Diagnosis not present

## 2022-12-18 NOTE — Patient Instructions (Signed)
It was good to see you today.  I do not see or feel any evidence of cancer recurrence on your exam.  I will see you for follow-up in 6 months.  As always, if you develop any new and concerning symptoms before your next visit, please call to see me sooner.   

## 2022-12-18 NOTE — Progress Notes (Signed)
Gynecologic Oncology Return Clinic Visit  12/18/22  Reason for Visit: surveillance in the setting of a history of ovarian cancer   Treatment History: Oncology History  Right ovarian epithelial cancer (HCC)  05/03/2020 Initial Diagnosis   Right ovarian epithelial cancer (HCC)   05/03/2020 Cancer Staging   Staging form: Ovary, Fallopian Tube, and Primary Peritoneal Carcinoma, AJCC 8th Edition - Clinical stage from 05/03/2020: FIGO Stage II, calculated as Stage Unknown (cT2b, cNX, cM0) - Signed by Adolphus Birchwood, MD on 05/24/2020   06/06/2020 - 09/21/2020 Chemotherapy   Patient is on Treatment Plan : OVARIAN Carboplatin (AUC 6) / Paclitaxel (175) q21d x 6 cycles      Genetic Testing   Negative genetic testing. No pathogenic variants identified on the [employer]. The report date is 08/10/2020.  The CancerNext gene panel offered by [employer] includes sequencing and rearrangement analysis for the following 36 genes: APC*, ATM*, AXIN2, BARD1, BMPR1A, BRCA1*, BRCA2*, BRIP1*, CDH1*, CDK4, CDKN2A, CHEK2*, DICER1, MLH1*, MSH2*, MSH3, MSH6*, MUTYH*, NBN, NF1*, NTHL1, PALB2*, PMS2*, PTEN*, RAD51C*, RAD51D*, RECQL, SMAD4, SMARCA4, STK11 and TP53* (sequencing and deletion/duplication); HOXB13, POLD1 and POLE (sequencing only); EPCAM and GREM1 (deletion/duplication only).   Somatic genes analyzed through TumorNext-HRD: ATM, BARD1, BRCA1, BRCA2, BRIP1, CHEK2, MRE11A, NBN, PALB2, RAD51C, RAD51D.    The patient reported approximately 54-month history of progressive urinary frequency.  This was initially worked up by testing and treating empirically for urinary tract infections.  When that failed to resolve her symptoms she was referred to a urologist, Dr. Annabell Howells, who performed a pelvic examination which palpated a mass in the pelvis.  Due to concern that her urinary frequency symptoms were secondary to extrinsic compression a CT scan of the abdomen and pelvis was ordered.  Of note her  urinary analysis was fairly unremarkable.   CT abdomen and pelvis performed at [facility] urology on 04/11/2020 showed a 14.5 x 14 x 12 cm solid and cystic right ovarian mass worrisome for ovarian carcinoma.  There were no findings for intraperitoneal metastatic disease.  There is no lymphadenopathy present.   A transvaginal ultrasound on 04/12/2020 confirmed this finding of an ovarian mass.  The uterus itself measured 7.2 x 3.2 x 4.6 cm with a 6 mm endometrium.  The right ovary was not visualized.  The left ovary was not visualized.  There was a large complex cystic mass filling the pelvis measuring 14.5 x 10.9 x 13.4 cm.  The majority of this was a complicated cystic locule.  However inferiorly was complex solid and cystic intracystic mass identified estimated to be 5.7 x 6.5 x 5 cm with multiple septations.  There was blood flow within the mural nodule.   CA 125 tumor marker was elevated at 114 on 04/20/20.  On 05/03/2020 she underwent a robotic assisted total hysterectomy with bilateral salpingo-oophorectomy, omentectomy, radical tumor debulking, and omentectomy.  Intraoperative findings were significant for a grossly normal-appearing 7 cm uterus and grossly normal-appearing left tube and ovary.  The right ovary was replaced by 15 cm cystic and solid mass which was solid at its inferior aspect and densely adherent and infiltrating into the right uterosacral ligament and right parametrial tissues.  The rectum was adherent to the posterior uterus and cervix in the midline.  There was no gross upper abdominal disease.  There was trace ascites that was amber in color.  There was unavoidable cyst ruptured during the procedure due to its infiltrative nature.  Diaphragms were smooth.  No gross abnormalities was seen in  the intestines.  There was no gross residual tumor at the completion of the procedure representing a complete, R0 resection.   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  The carcinoma was confined to the ovary without involvement of the ovarian surface.  The left ovary tube and uterus were all benign.  The omentum was benign as were the peritoneal biopsies for staging.  Peritoneal washings were negative.  Due to the infiltrative nature of the tumor into the pelvic sidewall she was determined to have a stage II cancer, grade 3 clear cell.  Due to the locally infiltrative nature of the tumor and its high-grade she was recommended to have adjuvant chemotherapy for 6 cycles of carboplatin and paclitaxel in accordance with NCCN guidelines.  Germline and somatic testing for HRD and BRCA was negative.    She went on to receive 6 cycles of carboplatin and paclitaxel chemotherapy with Dr Ellin Saba at [facility] between 06/06/20 and 09/19/20. She tolerated therapy well with toxicity of neuropathy persistent.   CA 125 postoperatively and pre-treatment was 46.5 on 08/08/20.   After completion of therapy it was 41 on 09/19/20.   Post-treatment CT abd/pelvis 10/16/2020 revealed no evidence of recurrent or metastatic disease.  There is minimal increased density at the vaginal apex that was felt to likely reflect postoperative changes with no measurable soft tissue in this location.  They recommended attention on follow-up.   CA 125 on 10/16/20 was 40.9. CA 125 on 01/22/21 was 38.8. CA 125 on 04/30/21 was normal at 30.1. CA 125 on 08/05/21 was normal at 32.3.   CT of the abdomen and pelvis on 08/06/2021: No findings of active malignancy.  Aortic atherosclerosis as well as systemic atherosclerosis noted.  Mild cardiomegaly.  Hiatal hernia.  Prominent stool throughout the colon favors constipation.  Sigmoid colon diverticulosis.  Interval History: Doing well.  Has been going to physical therapy for her lower back, notes that this is feeling somewhat better.  Continues to have urinary urgency but thinks this is mildly improved since the last time I saw her.  Endorses  normal bowel function, uses MiraLAX intermittently to help keep bowels regular.  Denies any abdominal or pelvic pain.  Denies vaginal bleeding.  Required recent cardioversion on Mother's Day for A-fib.  Denies significant symptoms since then.  Follow-up with her cardiologist in July.  Past Medical/Surgical History: Past Medical History:  Diagnosis Date   A-fib (HCC)    Anxiety    Arthritis    Asthma    hx of    Cancer (HCC)    ovarian   Dyspnea    with exertion    H/O seasonal allergies    Heart murmur    as aa child    Hypercholesteremia    Hypertension    Spinal stenosis     Past Surgical History:  Procedure Laterality Date   ABDOMINAL HYSTERECTOMY     BIOPSY  05/08/2020   Procedure: BIOPSY;  Surgeon: Dolores Frame, MD;  Location: [facility];  Service: Gastroenterology;;   BREAST SURGERY     CARDIAC CATHETERIZATION     CHOLECYSTECTOMY     COLONOSCOPY N/A 08/14/2015   Procedure: COLONOSCOPY;  Surgeon: Franky Macho, MD;  Location: [facility];  Service: Gastroenterology;  Laterality: N/A;   ESOPHAGOGASTRODUODENOSCOPY (EGD) WITH PROPOFOL N/A 05/08/2020   Procedure: ESOPHAGOGASTRODUODENOSCOPY (EGD) WITH PROPOFOL;  Surgeon: Dolores Frame, MD;  Location: [facility];  Service: Gastroenterology;  Laterality: N/A;   ESOPHAGOGASTRODUODENOSCOPY (EGD) WITH PROPOFOL N/A 08/07/2020   Procedure: ESOPHAGOGASTRODUODENOSCOPY (EGD) WITH PROPOFOL;  Surgeon: Dolores Frame, MD;  Location: [facility];  Service: Gastroenterology;  Laterality: N/A;  7:30   IR IMAGING GUIDED PORT INSERTION  06/04/2020   ROBOTIC ASSISTED TOTAL HYSTERECTOMY WITH BILATERAL SALPINGO OOPHERECTOMY N/A 05/03/2020   Procedure: XI ROBOTIC ASSISTED TOTAL HYSTERECTOMY WITH BILATERAL SALPINGO OOPHORECTOMY, OMENTECTOMY,RADICAL TUMOR DEBULKING, RIGID PROSTOSCOPY;  Surgeon: Adolphus Birchwood, MD;  Location: [facility];  Service: Gynecology;  Laterality: N/A;   THROMBECTOMY FEMORAL ARTERY Right  12/17/2013   Procedure: REPAIR OF RIGHT FEMORAL ARTERY PSEUDOANEURYSM;  Surgeon: Sherren Kerns, MD;  Location: [facility] OR;  Service: Vascular;  Laterality: Right;    Family History  Problem Relation Age of Onset   Hypertension Mother    Heart failure Mother    Asthma Mother    Ulcers Mother    Benign prostatic hyperplasia Father    Ulcers Father    Esophageal varices Father    Atrial fibrillation Sister    Atrial fibrillation Brother    Heart disease Brother    Melanoma Maternal Aunt    Colon cancer Neg Hx    Breast cancer Neg Hx    Ovarian cancer Neg Hx    Endometrial cancer Neg Hx    Pancreatic cancer Neg Hx    Prostate cancer Neg Hx     Social History   Socioeconomic History   Marital status: Married    Spouse name: Not on file   Number of children: 4   Years of education: college   Highest education level: Not on file  Occupational History   Occupation: retired Engineer, civil (consulting)  Tobacco Use   Smoking status: Never   Smokeless tobacco: Never  Vaping Use   Vaping Use: Never used  Substance and Sexual Activity   Alcohol use: No   Drug use: No   Sexual activity: Not Currently  Other Topics Concern   Not on file  Social History Narrative   Lives at home with husband.   Left-handed.   Caffeine use: 1 cup coffee (half caff), half can of Coke   Social Determinants of Health   Financial Resource Strain: Low Risk  (05/29/2020)   Overall Financial Resource Strain (CARDIA)    Difficulty of Paying Living Expenses: Not hard at all  Food Insecurity: No Food Insecurity (05/29/2020)   Hunger Vital Sign    Worried About Running Out of Food in the Last Year: Never true    Ran Out of Food in the Last Year: Never true  Transportation Needs: No Transportation Needs (05/29/2020)   PRAPARE - Administrator, Civil Service (Medical): No    Lack of Transportation (Non-Medical): No  Physical Activity: Insufficiently Active (05/29/2020)   Exercise Vital Sign    Days of Exercise per  Week: 7 days    Minutes of Exercise per Session: 10 min  Stress: No Stress Concern Present (05/29/2020)   [employer] of Occupational Health - Occupational Stress Questionnaire    Feeling of Stress : Not at all  Social Connections: Moderately Integrated (05/29/2020)   Social Connection and Isolation Panel [NHANES]    Frequency of Communication with Friends and Family: More than three times a week    Frequency of Social Gatherings with Friends and Family: More than three times a week    Attends Religious Services: More than 4 times per year    Active Member of Clubs or Organizations: No  Attends Banker Meetings: Never    Marital Status: Married    Current Medications:  Current Outpatient Medications:    acetaminophen (TYLENOL) 500 MG tablet, Take 1,000 mg by mouth 3 (three) times daily., Disp: , Rfl:    albuterol (VENTOLIN HFA) 108 (90 Base) MCG/ACT inhaler, Inhale 2 puffs into the lungs every 4 (four) hours as needed for wheezing or shortness of breath., Disp: 18 g, Rfl: 2   amLODipine (NORVASC) 10 MG tablet, Take 1 tablet (10 mg total) by mouth daily with lunch. For BP, Disp: 30 tablet, Rfl: 3   apixaban (ELIQUIS) 5 MG TABS tablet, Take 1 tablet (5 mg total) by mouth 2 (two) times daily., Disp: 180 tablet, Rfl: 1   Calcium Carb-Cholecalciferol (CALCIUM 600/VITAMIN D PO), Take 600 mg by mouth in the morning and at bedtime., Disp: , Rfl:    cetirizine (ZYRTEC) 10 MG tablet, Take 10 mg by mouth daily., Disp: , Rfl:    Cholecalciferol (VITAMIN D) 125 MCG (5000 UT) CAPS, Take 5,000 Units by mouth daily., Disp: , Rfl:    Coenzyme Q10 (COQ10) 100 MG CAPS, Take 100 mg by mouth at bedtime. , Disp: , Rfl:    cyclobenzaprine (FLEXERIL) 10 MG tablet, Take 5 mg by mouth See admin instructions. Take 5 mg at night, may take a second 5 mg dose during the day as needed for muscle spasms, Disp: , Rfl:    diltiazem (CARDIZEM) 30 MG tablet, Take 1 tablet every 4 hours AS NEEDED for AFIB  heart rate >100, Disp: 30 tablet, Rfl: 3   fluticasone (FLONASE) 50 MCG/ACT nasal spray, Place 1 spray into both nostrils daily. , Disp: , Rfl:    furosemide (LASIX) 20 MG tablet, TAKE 1 TABLET BY MOUTH DAILY AS  NEEDED FOR SWELLING, Disp: 100 tablet, Rfl: 2   gabapentin (NEURONTIN) 300 MG capsule, Take 300 mg by mouth 3 (three) times daily., Disp: , Rfl:    lisinopril (ZESTRIL) 30 MG tablet, Take 1 tablet (30 mg total) by mouth in the morning. (Patient taking differently: Take 20 mg by mouth in the morning.), Disp: , Rfl:    Magnesium 100 MG TABS, Take 50 mg by mouth at bedtime., Disp: , Rfl:    melatonin 5 MG TABS, Take 2.5 mg by mouth at bedtime as needed (Sleep)., Disp: , Rfl:    metoprolol succinate (TOPROL-XL) 100 MG 24 hr tablet, Take 1 tablet (100 mg total) by mouth in the morning., Disp: , Rfl:    Multiple Vitamins-Minerals (CENTRUM SILVER ADULT 50+ PO), Take 1 tablet by mouth daily., Disp: , Rfl:    Omega-3 Fatty Acids (FISH OIL) 1000 MG CAPS, Take 1,000 mg by mouth daily., Disp: , Rfl:    pantoprazole (PROTONIX) 40 MG tablet, Take 1 tablet (40 mg total) by mouth daily., Disp: 90 tablet, Rfl: 3   polyethylene glycol powder (GLYCOLAX/MIRALAX) 17 GM/SCOOP powder, Take 17 g by mouth daily as needed for mild constipation or moderate constipation (With lunch)., Disp: , Rfl:    Polyvinyl Alcohol-Povidone PF (REFRESH) 1.4-0.6 % SOLN, Apply 1 drop to eye as needed., Disp: , Rfl:    simvastatin (ZOCOR) 20 MG tablet, Take 20 mg by mouth at bedtime., Disp: , Rfl:    sodium chloride (OCEAN) 0.65 % SOLN nasal spray, Place 1 spray into both nostrils See admin instructions. Use 1 spray in each nostril in the morning may use a second dose at night as needed for congestion, Disp: , Rfl:  triamcinolone (KENALOG) 0.1 %, Apply 1 application topically daily as needed (irritation)., Disp: , Rfl:    vitamin C (ASCORBIC ACID) 500 MG tablet, Take 1,000 mg by mouth 2 (two) times daily., Disp: , Rfl:    Vitamins A  & D (VITAMIN A & D) ointment, Apply 1 application topically daily as needed for dry skin (irritation)., Disp: , Rfl:   Review of Systems: Denies appetite changes, fevers, chills, fatigue, unexplained weight changes. Denies hearing loss, neck lumps or masses, mouth sores, ringing in ears or voice changes. Denies cough or wheezing.  Denies shortness of breath. Denies chest pain or palpitations. Denies leg swelling. Denies abdominal distention, pain, blood in stools, constipation, diarrhea, nausea, vomiting, or early satiety. Denies pain with intercourse, dysuria, frequency, hematuria or incontinence. Denies hot flashes, pelvic pain, vaginal bleeding or vaginal discharge.   Denies joint pain, back pain or muscle pain/cramps. Denies itching, rash, or wounds. Denies dizziness, headaches, numbness or seizures. Denies swollen lymph nodes or glands, denies easy bruising or bleeding. Denies anxiety, depression, confusion, or decreased concentration.  Physical Exam: BP (!) 180/87 (BP Location: Left Arm, Patient Position: Sitting)   Pulse 87   Temp 98.1 F (36.7 C)   Ht 4' 9.64" (1.464 m)   Wt 171 lb 6.4 oz (77.7 kg)   SpO2 99%   BMI 36.27 kg/m  General: Alert, oriented, no acute distress. HEENT: Normocephalic, atraumatic, sclera anicteric. Chest: Unlabored breathing on room air. Cardiovascular: Irregularly irregular, no murmurs or rubs appreciated. Abdomen: Obese, soft, nontender.  Normoactive bowel sounds.  No masses or hepatosplenomegaly appreciated.  Well-healed incisions. Extremities: Grossly normal range of motion.  Warm, well perfused.  Trace edema bilaterally. Skin: No rashes or lesions noted. Lymphatics: No cervical, supraclavicular, or inguinal adenopathy. GU: Normal appearing external genitalia without erythema, excoriation, or lesions.  Speculum exam reveals mildly atrophic vaginal mucosa, no lesions appreciated.  Laxity of the anterior vaginal wall.  Bimanual exam reveals no  nodularity or masses.  Rectovaginal exam confirms these findings.  Laboratory & Radiologic Studies:          Component Ref Range & Units 4 mo ago (08/18/22) 10 mo ago (02/04/22) 1 yr ago (11/28/21) 1 yr ago (08/05/21) 1 yr ago (04/30/21) 1 yr ago (01/22/21) 2 yr ago (10/16/20)  Cancer Antigen (CA) 125 0.0 - 38.1 U/mL 29.8 33.1 CM 33.0 CM 32.3 CM 30.1 CM 38.8 High  CM 40.9 High  CM     CT A/P 08/18/22: IMPRESSION: 1. Status post hysterectomy and oophorectomy without evidence of local recurrence or abdominopelvic metastatic disease. 2. Wall thickening versus underdistention of the gastric antrum, correlate for symptoms of gastritis and consider further evaluation with upper endoscopy. 3. Sigmoid colonic diverticulosis without findings of acute diverticulitis. 4.  Aortic Atherosclerosis (ICD10-I70.0).  Assessment & Plan: April Guerra is a 77 y.o. woman with a history of stage II clear cell ovarian cancer s/p robotic assisted total hysterectomy, BSO, omentectomy, staging on 05/03/20. BRCA/HRD negative. S/p adjuvant carb/tax x 6 completed 09/19/20. Complete clinical response (upper limit normal but stable CA125, negative scans, negative exam).   The patient is overall doing very well and is NED on exam today.  Recent CT scan was negative for any evidence of disease. We will plan to get repeat blood work today.   Per NCCN surveillance recommendations, I recommend continuing surveillance visits every 3 months with an exam and CA-125 until at least 2 years out from completion of adjuvant treatment.  She saw Dr. Ellin Saba in  March, sees him again in September. I will see her again in December.  We reviewed signs and symptoms that would be concerning for cancer recurrence, and I have urged her to call if she develops any of these between visits.   She is following with urology for her urinary incontinence, urgency.  22 minutes of total time was spent for this patient encounter, including preparation,  face-to-face counseling with the patient and coordination of care, and documentation of the encounter.  Eugene Garnet, MD  Division of Gynecologic Oncology  Department of Obstetrics and Gynecology  [facility]

## 2022-12-20 LAB — CA 125: Cancer Antigen (CA) 125: 38.5 U/mL — ABNORMAL HIGH (ref 0.0–38.1)

## 2022-12-22 ENCOUNTER — Other Ambulatory Visit: Payer: Self-pay | Admitting: Gynecologic Oncology

## 2022-12-22 DIAGNOSIS — M545 Low back pain, unspecified: Secondary | ICD-10-CM | POA: Diagnosis not present

## 2022-12-22 DIAGNOSIS — C561 Malignant neoplasm of right ovary: Secondary | ICD-10-CM

## 2022-12-22 NOTE — Progress Notes (Unsigned)
Called patient - discussed increase in CA-125. CA-125 was similar level in 01/2022. I suspect increase since February is unrelated to her cancer history, but given jump, I recommended CT. Patient amenable. Order placed for CT A/P to be done at [facility] per her preference.   Eugene Garnet MD Gynecologic Oncology

## 2022-12-23 ENCOUNTER — Telehealth: Payer: Self-pay | Admitting: *Deleted

## 2022-12-23 NOTE — Telephone Encounter (Signed)
Patient scheduled for a CT at [facility] on 7/15. Patient given to date/time and instruction

## 2022-12-24 DIAGNOSIS — M545 Low back pain, unspecified: Secondary | ICD-10-CM | POA: Diagnosis not present

## 2022-12-31 DIAGNOSIS — M545 Low back pain, unspecified: Secondary | ICD-10-CM | POA: Diagnosis not present

## 2023-01-05 ENCOUNTER — Ambulatory Visit (HOSPITAL_COMMUNITY)
Admission: RE | Admit: 2023-01-05 | Discharge: 2023-01-05 | Disposition: A | Discharge status: Home / Self Care | Payer: Medicare Other | Source: Ambulatory Visit | Attending: Gynecologic Oncology | Admitting: Gynecologic Oncology

## 2023-01-05 ENCOUNTER — Encounter (HOSPITAL_COMMUNITY): Payer: Self-pay

## 2023-01-05 DIAGNOSIS — C561 Malignant neoplasm of right ovary: Secondary | ICD-10-CM | POA: Diagnosis not present

## 2023-01-05 DIAGNOSIS — N3289 Other specified disorders of bladder: Secondary | ICD-10-CM | POA: Diagnosis not present

## 2023-01-05 MED ORDER — HEPARIN SOD (PORK) LOCK FLUSH 100 UNIT/ML IV SOLN
INTRAVENOUS | Status: AC
  Filled 2023-01-05: qty 5

## 2023-01-05 MED ORDER — IOHEXOL 300 MG/ML  SOLN
100.0000 mL | Freq: Once | INTRAMUSCULAR | Status: AC | PRN
  Administered 2023-01-05: 100 mL via INTRAVENOUS

## 2023-01-06 ENCOUNTER — Ambulatory Visit: Payer: Medicare Other | Attending: Cardiology | Admitting: Cardiology

## 2023-01-06 ENCOUNTER — Ambulatory Visit: Payer: Medicare Other | Admitting: Cardiology

## 2023-01-06 VITALS — BP 125/75 | HR 71 | Ht <= 58 in | Wt 170.2 lb

## 2023-01-06 DIAGNOSIS — I10 Essential (primary) hypertension: Secondary | ICD-10-CM

## 2023-01-06 DIAGNOSIS — I480 Paroxysmal atrial fibrillation: Secondary | ICD-10-CM | POA: Diagnosis not present

## 2023-01-06 DIAGNOSIS — D6869 Other thrombophilia: Secondary | ICD-10-CM | POA: Diagnosis not present

## 2023-01-06 NOTE — Patient Instructions (Signed)
Medication Instructions:  Continue all current medications.   Labwork: none  Testing/Procedures: none  Follow-Up: 6 months   Any Other Special Instructions Will Be Listed Below (If Applicable).   If you need a refill on your cardiac medications before your next appointment, please call your pharmacy.  

## 2023-01-06 NOTE — Progress Notes (Signed)
Clinical Summary Ms. Feeser is a 77 y.o.female seen today for follow up of the following medical problems.   1.PAF - history of prior DCCV 05/01/2018 in setting of newly diagnosed afib, deemed to be of recent onset within hours to presentation -11/02/22 DCCV in ER  - no recent palpitations - compliant with meds    2. LE edema - some swelling at times, wears compression stockings - has lasix prn, takes few times a month      3.HTN - frequent urination, would avoid diuretic   -130s/70s-80s home bp's       3. Hyperlipidemia   - she is on simvastatin, reports dose lowered to 20mg  daily about 4 months ago. Symptoms no better.  - history of neuropathy, carpal tunnel   08/2021 TC 188 TG 115 HDL 60 LDL 108     She reports normal cath 2015, complicated by pseudoaneurysm required a surgery     SH: former nurse. Husband Minola Guin is also a patient of mine. Past Medical History:  Diagnosis Date   A-fib (HCC)    Anxiety    Arthritis    Asthma    hx of    Cancer (HCC)    ovarian   Dyspnea    with exertion    H/O seasonal allergies    Heart murmur    as aa child    Hypercholesteremia    Hypertension    Spinal stenosis      Allergies  Allergen Reactions   Morphine And Codeine Nausea And Vomiting   Zofran [Ondansetron]     Causes minor constipation, tolerates low doses       Current Outpatient Medications  Medication Sig Dispense Refill   acetaminophen (TYLENOL) 500 MG tablet Take 1,000 mg by mouth 3 (three) times daily.     albuterol (VENTOLIN HFA) 108 (90 Base) MCG/ACT inhaler Inhale 2 puffs into the lungs every 4 (four) hours as needed for wheezing or shortness of breath. 18 g 2   amLODipine (NORVASC) 10 MG tablet Take 1 tablet (10 mg total) by mouth daily with lunch. For BP 30 tablet 3   apixaban (ELIQUIS) 5 MG TABS tablet Take 1 tablet (5 mg total) by mouth 2 (two) times daily. 180 tablet 1   Calcium Carb-Cholecalciferol (CALCIUM 600/VITAMIN D PO) Take  600 mg by mouth in the morning and at bedtime.     cetirizine (ZYRTEC) 10 MG tablet Take 10 mg by mouth daily.     Cholecalciferol (VITAMIN D) 125 MCG (5000 UT) CAPS Take 5,000 Units by mouth daily.     Coenzyme Q10 (COQ10) 100 MG CAPS Take 100 mg by mouth at bedtime.      cyclobenzaprine (FLEXERIL) 10 MG tablet Take 5 mg by mouth See admin instructions. Take 5 mg at night, may take a second 5 mg dose during the day as needed for muscle spasms     diltiazem (CARDIZEM) 30 MG tablet Take 1 tablet every 4 hours AS NEEDED for AFIB heart rate >100 30 tablet 3   fluticasone (FLONASE) 50 MCG/ACT nasal spray Place 1 spray into both nostrils daily.      furosemide (LASIX) 20 MG tablet TAKE 1 TABLET BY MOUTH DAILY AS  NEEDED FOR SWELLING 100 tablet 2   gabapentin (NEURONTIN) 300 MG capsule Take 300 mg by mouth 3 (three) times daily.     lisinopril (ZESTRIL) 30 MG tablet Take 1 tablet (30 mg total) by mouth in the morning. (Patient  taking differently: Take 20 mg by mouth in the morning.)     Magnesium 100 MG TABS Take 50 mg by mouth at bedtime.     melatonin 5 MG TABS Take 2.5 mg by mouth at bedtime as needed (Sleep).     metoprolol succinate (TOPROL-XL) 100 MG 24 hr tablet Take 1 tablet (100 mg total) by mouth in the morning.     Multiple Vitamins-Minerals (CENTRUM SILVER ADULT 50+ PO) Take 1 tablet by mouth daily.     Omega-3 Fatty Acids (FISH OIL) 1000 MG CAPS Take 1,000 mg by mouth daily.     pantoprazole (PROTONIX) 40 MG tablet Take 1 tablet (40 mg total) by mouth daily. 90 tablet 3   polyethylene glycol powder (GLYCOLAX/MIRALAX) 17 GM/SCOOP powder Take 17 g by mouth daily as needed for mild constipation or moderate constipation (With lunch).     Polyvinyl Alcohol-Povidone PF (REFRESH) 1.4-0.6 % SOLN Apply 1 drop to eye as needed.     simvastatin (ZOCOR) 20 MG tablet Take 20 mg by mouth at bedtime.     sodium chloride (OCEAN) 0.65 % SOLN nasal spray Place 1 spray into both nostrils See admin  instructions. Use 1 spray in each nostril in the morning may use a second dose at night as needed for congestion     triamcinolone (KENALOG) 0.1 % Apply 1 application topically daily as needed (irritation).     vitamin C (ASCORBIC ACID) 500 MG tablet Take 1,000 mg by mouth 2 (two) times daily.     Vitamins A & D (VITAMIN A & D) ointment Apply 1 application topically daily as needed for dry skin (irritation).     No current facility-administered medications for this visit.     Past Surgical History:  Procedure Laterality Date   ABDOMINAL HYSTERECTOMY     BIOPSY  05/08/2020   Procedure: BIOPSY;  Surgeon: Dolores Frame, MD;  Location: [facility];  Service: Gastroenterology;;   BREAST SURGERY     CARDIAC CATHETERIZATION     CHOLECYSTECTOMY     COLONOSCOPY N/A 08/14/2015   Procedure: COLONOSCOPY;  Surgeon: Franky Macho, MD;  Location: [facility];  Service: Gastroenterology;  Laterality: N/A;   ESOPHAGOGASTRODUODENOSCOPY (EGD) WITH PROPOFOL N/A 05/08/2020   Procedure: ESOPHAGOGASTRODUODENOSCOPY (EGD) WITH PROPOFOL;  Surgeon: Dolores Frame, MD;  Location: [facility];  Service: Gastroenterology;  Laterality: N/A;   ESOPHAGOGASTRODUODENOSCOPY (EGD) WITH PROPOFOL N/A 08/07/2020   Procedure: ESOPHAGOGASTRODUODENOSCOPY (EGD) WITH PROPOFOL;  Surgeon: Dolores Frame, MD;  Location: [facility];  Service: Gastroenterology;  Laterality: N/A;  7:30   IR IMAGING GUIDED PORT INSERTION  06/04/2020   ROBOTIC ASSISTED TOTAL HYSTERECTOMY WITH BILATERAL SALPINGO OOPHERECTOMY N/A 05/03/2020   Procedure: XI ROBOTIC ASSISTED TOTAL HYSTERECTOMY WITH BILATERAL SALPINGO OOPHORECTOMY, OMENTECTOMY,RADICAL TUMOR DEBULKING, RIGID PROSTOSCOPY;  Surgeon: Adolphus Birchwood, MD;  Location: [facility];  Service: Gynecology;  Laterality: N/A;   THROMBECTOMY FEMORAL ARTERY Right 12/17/2013   Procedure: REPAIR OF RIGHT FEMORAL ARTERY PSEUDOANEURYSM;  Surgeon: Sherren Kerns, MD;  Location: [facility];   Service: Vascular;  Laterality: Right;     Allergies  Allergen Reactions   Morphine And Codeine Nausea And Vomiting   Zofran [Ondansetron]     Causes minor constipation, tolerates low doses        Family History  Problem Relation Age of Onset   Hypertension Mother    Heart failure Mother    Asthma Mother    Ulcers Mother    Benign prostatic hyperplasia Father  Ulcers Father    Esophageal varices Father    Atrial fibrillation Sister    Atrial fibrillation Brother    Heart disease Brother    Melanoma Maternal Aunt    Colon cancer Neg Hx    Breast cancer Neg Hx    Ovarian cancer Neg Hx    Endometrial cancer Neg Hx    Pancreatic cancer Neg Hx    Prostate cancer Neg Hx      Social History Ms. Demaria reports that she has never smoked. She has never used smokeless tobacco. Ms. Elders reports no history of alcohol use.   Review of Systems CONSTITUTIONAL: No weight loss, fever, chills, weakness or fatigue.  HEENT: Eyes: No visual loss, blurred vision, double vision or yellow sclerae.No hearing loss, sneezing, congestion, runny nose or sore throat.  SKIN: No rash or itching.  CARDIOVASCULAR: per hpi RESPIRATORY: No shortness of breath, cough or sputum.  GASTROINTESTINAL: No anorexia, nausea, vomiting or diarrhea. No abdominal pain or blood.  GENITOURINARY: No burning on urination, no polyuria NEUROLOGICAL: No headache, dizziness, syncope, paralysis, ataxia, numbness or tingling in the extremities. No change in bowel or bladder control.  MUSCULOSKELETAL: No muscle, back pain, joint pain or stiffness.  LYMPHATICS: No enlarged nodes. No history of splenectomy.  PSYCHIATRIC: No history of depression or anxiety.  ENDOCRINOLOGIC: No reports of sweating, cold or heat intolerance. No polyuria or polydipsia.  Marland Kitchen   Physical Examination Today's Vitals   01/06/23 0932 01/06/23 1017  BP: (!) 148/82 125/75  Pulse: 71   SpO2: 95%   Weight: 170 lb 3.2 oz (77.2 kg)   Height: 4' 9.5"  (1.461 m)    Body mass index is 36.19 kg/m.  Gen: resting comfortably, no acute distress HEENT: no scleral icterus, pupils equal round and reactive, no palptable cervical adenopathy,  CV: RRR, no mrg, no jvd Resp: Clear to auscultation bilaterally GI: abdomen is soft, non-tender, non-distended, normal bowel sounds, no hepatosplenomegaly MSK: extremities are warm, no edema.  Skin: warm, no rash Neuro:  no focal deficits Psych: appropriate affect   Diagnostic Studies   04/2018 echo   Study Conclusions   - Left ventricle: The cavity size was normal. Wall thickness was    increased in a pattern of moderate LVH. Systolic function was    normal. The estimated ejection fraction was in the range of 60%    to 65%. Wall motion was normal; there were no regional wall    motion abnormalities. Left ventricular diastolic function    parameters were normal.  - Mitral valve: Mildly thickened leaflets . There was mild    regurgitation.  - Left atrium: The atrium was mildly dilated.  - Right atrium: The atrium was at the upper limits of normal in    size.  - Inferior vena cava: The vessel was normal in size. The    respirophasic diameter changes were in the normal range (>= 50%),    consistent with normal central venous pressure.           Assessment and Plan  1.AFib/acquired thrombophilia - doing well since most recent DCCV 10/2022 - EKG today shows NSR - no symptoms, continue current meds including eliquis for stroke prevention  2. HTN - at goal, continue current meds     Antoine Poche, M.D.

## 2023-01-07 ENCOUNTER — Other Ambulatory Visit (HOSPITAL_COMMUNITY): Payer: Medicare Other

## 2023-01-12 ENCOUNTER — Encounter: Payer: Self-pay | Admitting: *Deleted

## 2023-01-13 ENCOUNTER — Telehealth: Payer: Self-pay | Admitting: *Deleted

## 2023-01-13 ENCOUNTER — Telehealth: Payer: Self-pay | Admitting: Gynecologic Oncology

## 2023-01-13 NOTE — Telephone Encounter (Signed)
Spoke with April Guerra who called the office inquiry about her CT scan results. Advised patient that her message would be sent to provider and the office would call back.

## 2023-01-13 NOTE — Telephone Encounter (Signed)
Called patient, discussed CT results. NO urinary symptoms, doesn't typically drink much in the am so I suspect bladder was under distended.  Eugene Garnet MD Gynecologic Oncology

## 2023-01-14 DIAGNOSIS — T169XXA Foreign body in ear, unspecified ear, initial encounter: Secondary | ICD-10-CM | POA: Diagnosis not present

## 2023-03-05 ENCOUNTER — Other Ambulatory Visit: Payer: Self-pay

## 2023-03-05 DIAGNOSIS — C561 Malignant neoplasm of right ovary: Secondary | ICD-10-CM

## 2023-03-06 ENCOUNTER — Inpatient Hospital Stay: Payer: Medicare Other

## 2023-03-06 ENCOUNTER — Inpatient Hospital Stay: Payer: Medicare Other | Attending: Gynecologic Oncology

## 2023-03-06 DIAGNOSIS — Z8249 Family history of ischemic heart disease and other diseases of the circulatory system: Secondary | ICD-10-CM | POA: Diagnosis not present

## 2023-03-06 DIAGNOSIS — I10 Essential (primary) hypertension: Secondary | ICD-10-CM | POA: Diagnosis not present

## 2023-03-06 DIAGNOSIS — Z825 Family history of asthma and other chronic lower respiratory diseases: Secondary | ICD-10-CM | POA: Insufficient documentation

## 2023-03-06 DIAGNOSIS — Z808 Family history of malignant neoplasm of other organs or systems: Secondary | ICD-10-CM | POA: Insufficient documentation

## 2023-03-06 DIAGNOSIS — Z885 Allergy status to narcotic agent status: Secondary | ICD-10-CM | POA: Insufficient documentation

## 2023-03-06 DIAGNOSIS — Z9049 Acquired absence of other specified parts of digestive tract: Secondary | ICD-10-CM | POA: Insufficient documentation

## 2023-03-06 DIAGNOSIS — C561 Malignant neoplasm of right ovary: Secondary | ICD-10-CM | POA: Diagnosis not present

## 2023-03-06 DIAGNOSIS — I4891 Unspecified atrial fibrillation: Secondary | ICD-10-CM | POA: Diagnosis not present

## 2023-03-06 DIAGNOSIS — M549 Dorsalgia, unspecified: Secondary | ICD-10-CM | POA: Diagnosis not present

## 2023-03-06 DIAGNOSIS — Z90710 Acquired absence of both cervix and uterus: Secondary | ICD-10-CM | POA: Diagnosis not present

## 2023-03-06 DIAGNOSIS — Z90722 Acquired absence of ovaries, bilateral: Secondary | ICD-10-CM | POA: Insufficient documentation

## 2023-03-06 DIAGNOSIS — Z8543 Personal history of malignant neoplasm of ovary: Secondary | ICD-10-CM | POA: Diagnosis present

## 2023-03-06 DIAGNOSIS — Z888 Allergy status to other drugs, medicaments and biological substances status: Secondary | ICD-10-CM | POA: Diagnosis not present

## 2023-03-06 DIAGNOSIS — Z7901 Long term (current) use of anticoagulants: Secondary | ICD-10-CM | POA: Diagnosis not present

## 2023-03-06 DIAGNOSIS — Z79899 Other long term (current) drug therapy: Secondary | ICD-10-CM | POA: Diagnosis not present

## 2023-03-06 DIAGNOSIS — G629 Polyneuropathy, unspecified: Secondary | ICD-10-CM | POA: Insufficient documentation

## 2023-03-06 DIAGNOSIS — Z08 Encounter for follow-up examination after completed treatment for malignant neoplasm: Secondary | ICD-10-CM | POA: Diagnosis present

## 2023-03-06 DIAGNOSIS — K5900 Constipation, unspecified: Secondary | ICD-10-CM | POA: Insufficient documentation

## 2023-03-06 LAB — COMPREHENSIVE METABOLIC PANEL
ALT: 25 U/L (ref 0–44)
AST: 28 U/L (ref 15–41)
Albumin: 4.7 g/dL (ref 3.5–5.0)
Alkaline Phosphatase: 57 U/L (ref 38–126)
Anion gap: 11 (ref 5–15)
BUN: 12 mg/dL (ref 8–23)
CO2: 24 mmol/L (ref 22–32)
Calcium: 9.5 mg/dL (ref 8.9–10.3)
Chloride: 97 mmol/L — ABNORMAL LOW (ref 98–111)
Creatinine, Ser: 0.72 mg/dL (ref 0.44–1.00)
GFR, Estimated: >60 mL/min (ref >60)
Glucose, Bld: 96 mg/dL (ref 70–99)
Potassium: 4.3 mmol/L (ref 3.5–5.1)
Sodium: 132 mmol/L — ABNORMAL LOW (ref 135–145)
Total Bilirubin: 0.7 mg/dL (ref 0.3–1.2)
Total Protein: 7.8 g/dL (ref 6.5–8.1)

## 2023-03-06 LAB — CBC WITH DIFFERENTIAL/PLATELET
Abs Immature Granulocytes: 0.01 10*3/uL (ref 0.00–0.07)
Basophils Absolute: 0 10*3/uL (ref 0.0–0.1)
Basophils Relative: 0 %
Eosinophils Absolute: 0 10*3/uL (ref 0.0–0.5)
Eosinophils Relative: 1 %
HCT: 40 % (ref 36.0–46.0)
Hemoglobin: 14 g/dL (ref 12.0–15.0)
Immature Granulocytes: 0 %
Lymphocytes Relative: 29 %
Lymphs Abs: 1.6 10*3/uL (ref 0.7–4.0)
MCH: 33.6 pg (ref 26.0–34.0)
MCHC: 35 g/dL (ref 30.0–36.0)
MCV: 95.9 fL (ref 80.0–100.0)
Monocytes Absolute: 0.6 10*3/uL (ref 0.1–1.0)
Monocytes Relative: 11 %
Neutro Abs: 3.2 10*3/uL (ref 1.7–7.7)
Neutrophils Relative %: 59 %
Platelets: 325 10*3/uL (ref 150–400)
RBC: 4.17 MIL/uL (ref 3.87–5.11)
RDW: 12.8 % (ref 11.5–15.5)
WBC: 5.5 10*3/uL (ref 4.0–10.5)
nRBC: 0 % (ref 0.0–0.2)

## 2023-03-07 LAB — CA 125: Cancer Antigen (CA) 125: 32.9 U/mL (ref 0.0–38.1)

## 2023-03-09 DIAGNOSIS — E7849 Other hyperlipidemia: Secondary | ICD-10-CM | POA: Diagnosis not present

## 2023-03-09 DIAGNOSIS — E7800 Pure hypercholesterolemia, unspecified: Secondary | ICD-10-CM | POA: Diagnosis not present

## 2023-03-09 DIAGNOSIS — I10 Essential (primary) hypertension: Secondary | ICD-10-CM | POA: Diagnosis not present

## 2023-03-09 DIAGNOSIS — E782 Mixed hyperlipidemia: Secondary | ICD-10-CM | POA: Diagnosis not present

## 2023-03-09 DIAGNOSIS — M79671 Pain in right foot: Secondary | ICD-10-CM | POA: Diagnosis not present

## 2023-03-12 ENCOUNTER — Inpatient Hospital Stay: Payer: Medicare Other | Admitting: Hematology

## 2023-03-12 DIAGNOSIS — E7849 Other hyperlipidemia: Secondary | ICD-10-CM | POA: Diagnosis not present

## 2023-03-12 DIAGNOSIS — K922 Gastrointestinal hemorrhage, unspecified: Secondary | ICD-10-CM | POA: Diagnosis not present

## 2023-03-12 DIAGNOSIS — G620 Drug-induced polyneuropathy: Secondary | ICD-10-CM | POA: Diagnosis not present

## 2023-03-12 DIAGNOSIS — Z1389 Encounter for screening for other disorder: Secondary | ICD-10-CM | POA: Diagnosis not present

## 2023-03-12 DIAGNOSIS — Z23 Encounter for immunization: Secondary | ICD-10-CM | POA: Diagnosis not present

## 2023-03-12 DIAGNOSIS — D500 Iron deficiency anemia secondary to blood loss (chronic): Secondary | ICD-10-CM | POA: Diagnosis not present

## 2023-03-12 DIAGNOSIS — L570 Actinic keratosis: Secondary | ICD-10-CM | POA: Diagnosis not present

## 2023-03-12 DIAGNOSIS — I10 Essential (primary) hypertension: Secondary | ICD-10-CM | POA: Diagnosis not present

## 2023-03-12 DIAGNOSIS — I4891 Unspecified atrial fibrillation: Secondary | ICD-10-CM | POA: Diagnosis not present

## 2023-03-13 ENCOUNTER — Inpatient Hospital Stay: Payer: Medicare Other

## 2023-03-13 ENCOUNTER — Inpatient Hospital Stay: Payer: Medicare Other | Admitting: Oncology

## 2023-03-13 VITALS — BP 168/79 | HR 82 | Temp 98.9°F | Resp 16 | Wt 170.4 lb

## 2023-03-13 DIAGNOSIS — C561 Malignant neoplasm of right ovary: Secondary | ICD-10-CM

## 2023-03-13 DIAGNOSIS — I4891 Unspecified atrial fibrillation: Secondary | ICD-10-CM | POA: Diagnosis not present

## 2023-03-13 DIAGNOSIS — G629 Polyneuropathy, unspecified: Secondary | ICD-10-CM | POA: Diagnosis not present

## 2023-03-13 DIAGNOSIS — Z8249 Family history of ischemic heart disease and other diseases of the circulatory system: Secondary | ICD-10-CM | POA: Diagnosis not present

## 2023-03-13 DIAGNOSIS — K5900 Constipation, unspecified: Secondary | ICD-10-CM | POA: Diagnosis not present

## 2023-03-13 DIAGNOSIS — M549 Dorsalgia, unspecified: Secondary | ICD-10-CM | POA: Diagnosis not present

## 2023-03-13 DIAGNOSIS — Z808 Family history of malignant neoplasm of other organs or systems: Secondary | ICD-10-CM | POA: Diagnosis not present

## 2023-03-13 DIAGNOSIS — Z79899 Other long term (current) drug therapy: Secondary | ICD-10-CM | POA: Diagnosis not present

## 2023-03-13 DIAGNOSIS — Z885 Allergy status to narcotic agent status: Secondary | ICD-10-CM | POA: Diagnosis not present

## 2023-03-13 DIAGNOSIS — I10 Essential (primary) hypertension: Secondary | ICD-10-CM | POA: Diagnosis not present

## 2023-03-13 DIAGNOSIS — Z9049 Acquired absence of other specified parts of digestive tract: Secondary | ICD-10-CM | POA: Diagnosis not present

## 2023-03-13 DIAGNOSIS — Z7901 Long term (current) use of anticoagulants: Secondary | ICD-10-CM | POA: Diagnosis not present

## 2023-03-13 DIAGNOSIS — Z825 Family history of asthma and other chronic lower respiratory diseases: Secondary | ICD-10-CM | POA: Diagnosis not present

## 2023-03-13 DIAGNOSIS — Z888 Allergy status to other drugs, medicaments and biological substances status: Secondary | ICD-10-CM | POA: Diagnosis not present

## 2023-03-13 MED ORDER — SODIUM CHLORIDE 0.9% FLUSH
10.0000 mL | Freq: Once | INTRAVENOUS | Status: AC
  Administered 2023-03-13: 10 mL via INTRAVENOUS

## 2023-03-13 MED ORDER — HEPARIN SOD (PORK) LOCK FLUSH 100 UNIT/ML IV SOLN
500.0000 [IU] | Freq: Once | INTRAVENOUS | Status: AC
  Administered 2023-03-13: 500 [IU] via INTRAVENOUS

## 2023-03-13 NOTE — Progress Notes (Signed)
Patients port flushed without difficulty.  Good blood return noted with no bruising or swelling noted at site.  Band aid applied.  VSS with discharge and left in satisfactory condition with no s/s of distress noted.   

## 2023-03-13 NOTE — Progress Notes (Unsigned)
[facility] [address] [place] [address]    Clinic Day:  03/13/2023  Referring physician: Estanislado Pandy, MD  Patient Care Team: Estanislado Pandy, MD as PCP - General (Family Medicine) Wyline Mood Dorothe Pea, MD as PCP - Cardiology (Cardiology) Therese Sarah, RN as Oncology Nurse Navigator (Oncology)   ASSESSMENT & PLAN:   Assessment: 1.  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 -[id number] on 04/20/2020 -Evaluated by Dr. Andrey Farmer on 05/24/2020. -6 cycles of carboplatin and paclitaxel was recommended. -Somatic and germline mutation testing was negative. -6 cycles of carboplatin and paclitaxel from 06/06/2020 through 09/19/2020.   2.  Atrial fibrillation: - Followed by Dr. Wyline Mood. -Had A-fib with RVR in May 2024.  Required DCCV. -During most recent visit in July 2024 she was in normal sinus rhythm.    Plan: 1.  Stage II clear-cell right ovarian cancer: - She denies any abdominal pain or bloating. - CTAP on 01/05/2023 did not reveal any evidence of recurrence. -Labs from 03/06/2023 showing unremarkable CBC.  CA 125 is 32.9 which is within normal limits.  LFTs are normal. - She takes MiraLAX 2-3 times per week for constipation. - Recommend follow-up in 6 months with labs including tumor marker (cbc/d, cmp, ca125).  Had CT scan completed on 01/05/2023 with Dr. Pricilla Holm which did not reveal evidence of recurrent malignancy. -Recommend CT scan if tumor markers trend up.    2.  Peripheral neuropathy: - She takes gabapentin 300 mg at bedtime as needed up to 3 times per week.   3.  Genetic testing: - Somatic and germline mutation testing was negative.  4. Back pain  -She completed physical therapy for back pain this summer.  Feels like it really did help her strength and mobility.   -Takes gabapentin and magnesium at bedtime which seems to help. -She will occasionally  take Flexeril and Tylenol for back pain, right arm pain.   No orders of the defined types were placed in this encounter.  PLAN SUMMARY: >> No evidence of recurrence based on exam and recent CT scan. >> Return to clinic in 6 months with labs a few days before.     I spent 20 minutes dedicated to the care of this patient (face-to-face and non-face-to-face) on the date of the encounter to include what is described in the assessment and plan.   Mauro Kaufmann, NP   9/20/20247:53 AM  CHIEF COMPLAINT:   Diagnosis: right ovarian cancer    Cancer Staging  Right ovarian epithelial cancer [facility]) Staging form: Ovary, Fallopian Tube, and Primary Peritoneal Carcinoma, AJCC 8th Edition - Clinical stage from 05/03/2020: FIGO Stage II, calculated as Stage Unknown (cT2b, cNX, cM0) - Signed by Adolphus Birchwood, MD on 05/24/2020    Prior Therapy:  1. TAH-BSO on 05/03/2020. 2. Carboplatin, paclitaxel & Aloxi x 6 cycles from 06/06/2020 to 09/19/2020.  Current Therapy:  surveillance    HISTORY OF PRESENT ILLNESS:   Oncology History  Right ovarian epithelial cancer (HCC)  05/03/2020 Initial Diagnosis   Right ovarian epithelial cancer (HCC)   05/03/2020 Cancer Staging   Staging form: Ovary, Fallopian Tube, and Primary Peritoneal Carcinoma, AJCC 8th Edition - Clinical stage from 05/03/2020: FIGO Stage II, calculated as Stage Unknown (cT2b, cNX, cM0) - Signed by Adolphus Birchwood, MD on 05/24/2020   06/06/2020 - 09/21/2020 Chemotherapy   Patient is on Treatment Plan : OVARIAN Carboplatin (AUC 6) / Paclitaxel (  175) q21d x 6 cycles      Genetic Testing   Negative genetic testing. No pathogenic variants identified on the [employer]. The report date is 08/10/2020.  The CancerNext gene panel offered by [employer] includes sequencing and rearrangement analysis for the following 36 genes: APC*, ATM*, AXIN2, BARD1, BMPR1A, BRCA1*, BRCA2*, BRIP1*, CDH1*, CDK4, CDKN2A, CHEK2*, DICER1,  MLH1*, MSH2*, MSH3, MSH6*, MUTYH*, NBN, NF1*, NTHL1, PALB2*, PMS2*, PTEN*, RAD51C*, RAD51D*, RECQL, SMAD4, SMARCA4, STK11 and TP53* (sequencing and deletion/duplication); HOXB13, POLD1 and POLE (sequencing only); EPCAM and GREM1 (deletion/duplication only).   Somatic genes analyzed through TumorNext-HRD: ATM, BARD1, BRCA1, BRCA2, BRIP1, CHEK2, MRE11A, NBN, PALB2, RAD51C, RAD51D.      INTERVAL HISTORY:   April Guerra is a 77 y.o. female presenting to clinic today for follow up of right ovarian cancer. She was last seen by Dr. Ellin Saba on 09/01/2022.  In the interim, she was seen at [facility] ED (11/02/2022) for atrial fibrillation/acquired thrombophilia.  Required DCCV 10/2022.  She is now followed by Dr. Wyline Mood.  She is on Eliquis.  She had a CT abdomen/pelvis on 01/05/2023 but did not reveal any evidence of metastatic disease in the abdomen or pelvis.  Diffuse bladder wall thickening may be related to underdistention correlate clinically for cystitis.  Overall, she is doing well.  Today, she states that she is doing well overall. Her appetite level is at 100%. Her energy level is at 100%. She denies any abdominal pain or bloating.   PAST MEDICAL HISTORY:   Past Medical History: Past Medical History:  Diagnosis Date  . A-fib (HCC)   . Anxiety   . Arthritis   . Asthma    hx of   . Cancer (HCC)    ovarian  . Dyspnea    with exertion   . H/O seasonal allergies   . Heart murmur    as aa child   . Hypercholesteremia   . Hypertension   . Spinal stenosis     Surgical History: Past Surgical History:  Procedure Laterality Date  . ABDOMINAL HYSTERECTOMY    . BIOPSY  05/08/2020   Procedure: BIOPSY;  Surgeon: Dolores Frame, MD;  Location: [facility];  Service: Gastroenterology;;  . BREAST SURGERY    . CARDIAC CATHETERIZATION    . CHOLECYSTECTOMY    . COLONOSCOPY N/A 08/14/2015   Procedure: COLONOSCOPY;  Surgeon: Franky Macho, MD;  Location: [facility];  Service:  Gastroenterology;  Laterality: N/A;  . ESOPHAGOGASTRODUODENOSCOPY (EGD) WITH PROPOFOL N/A 05/08/2020   Procedure: ESOPHAGOGASTRODUODENOSCOPY (EGD) WITH PROPOFOL;  Surgeon: Dolores Frame, MD;  Location: [facility];  Service: Gastroenterology;  Laterality: N/A;  . ESOPHAGOGASTRODUODENOSCOPY (EGD) WITH PROPOFOL N/A 08/07/2020   Procedure: ESOPHAGOGASTRODUODENOSCOPY (EGD) WITH PROPOFOL;  Surgeon: Dolores Frame, MD;  Location: [facility];  Service: Gastroenterology;  Laterality: N/A;  7:30  . IR IMAGING GUIDED PORT INSERTION  06/04/2020  . ROBOTIC ASSISTED TOTAL HYSTERECTOMY WITH BILATERAL SALPINGO OOPHERECTOMY N/A 05/03/2020   Procedure: XI ROBOTIC ASSISTED TOTAL HYSTERECTOMY WITH BILATERAL SALPINGO OOPHORECTOMY, OMENTECTOMY,RADICAL TUMOR DEBULKING, RIGID PROSTOSCOPY;  Surgeon: Adolphus Birchwood, MD;  Location: [facility];  Service: Gynecology;  Laterality: N/A;  . THROMBECTOMY FEMORAL ARTERY Right 12/17/2013   Procedure: REPAIR OF RIGHT FEMORAL ARTERY PSEUDOANEURYSM;  Surgeon: Sherren Kerns, MD;  Location: [facility] OR;  Service: Vascular;  Laterality: Right;    Social History: Social History   Socioeconomic History  . Marital status: Married    Spouse name: Not on file  .  Number of children: 4  . Years of education: college  . Highest education level: Not on file  Occupational History  . Occupation: retired Engineer, civil (consulting)  Tobacco Use  . Smoking status: Never  . Smokeless tobacco: Never  Vaping Use  . Vaping status: Never Used  Substance and Sexual Activity  . Alcohol use: No  . Drug use: No  . Sexual activity: Not Currently  Other Topics Concern  . Not on file  Social History Narrative   Lives at home with husband.   Left-handed.   Caffeine use: 1 cup coffee (half caff), half can of Coke   Social Determinants of Health   Financial Resource Strain: Low Risk  (05/29/2020)   Overall Financial Resource Strain (CARDIA)   . Difficulty of Paying Living Expenses: Not hard at all   Food Insecurity: No Food Insecurity (05/29/2020)   Hunger Vital Sign   . Worried About Programme researcher, broadcasting/film/video in the Last Year: Never true   . Ran Out of Food in the Last Year: Never true  Transportation Needs: No Transportation Needs (05/29/2020)   PRAPARE - Transportation   . Lack of Transportation (Medical): No   . Lack of Transportation (Non-Medical): No  Physical Activity: Insufficiently Active (05/29/2020)   Exercise Vital Sign   . Days of Exercise per Week: 7 days   . Minutes of Exercise per Session: 10 min  Stress: No Stress Concern Present (05/29/2020)   [employer] of Occupational Health - Occupational Stress Questionnaire   . Feeling of Stress : Not at all  Social Connections: Moderately Integrated (05/29/2020)   Social Connection and Isolation Panel [NHANES]   . Frequency of Communication with Friends and Family: More than three times a week   . Frequency of Social Gatherings with Friends and Family: More than three times a week   . Attends Religious Services: More than 4 times per year   . Active Member of Clubs or Organizations: No   . Attends Banker Meetings: Never   . Marital Status: Married  Catering manager Violence: Not At Risk (05/29/2020)   Humiliation, Afraid, Rape, and Kick questionnaire   . Fear of Current or Ex-Partner: No   . Emotionally Abused: No   . Physically Abused: No   . Sexually Abused: No    Family History: Family History  Problem Relation Age of Onset  . Hypertension Mother   . Heart failure Mother   . Asthma Mother   . Ulcers Mother   . Benign prostatic hyperplasia Father   . Ulcers Father   . Esophageal varices Father   . Atrial fibrillation Sister   . Atrial fibrillation Brother   . Heart disease Brother   . Melanoma Maternal Aunt   . Colon cancer Neg Hx   . Breast cancer Neg Hx   . Ovarian cancer Neg Hx   . Endometrial cancer Neg Hx   . Pancreatic cancer Neg Hx   . Prostate cancer Neg Hx     Current  Medications:  Current Outpatient Medications:  .  acetaminophen (TYLENOL) 500 MG tablet, Take 1,000 mg by mouth 3 (three) times daily., Disp: , Rfl:  .  albuterol (VENTOLIN HFA) 108 (90 Base) MCG/ACT inhaler, Inhale 2 puffs into the lungs every 4 (four) hours as needed for wheezing or shortness of breath., Disp: 18 g, Rfl: 2 .  amLODipine (NORVASC) 10 MG tablet, Take 1 tablet (10 mg total) by mouth daily with lunch. For BP, Disp: 30 tablet, Rfl:  3 .  apixaban (ELIQUIS) 5 MG TABS tablet, Take 1 tablet (5 mg total) by mouth 2 (two) times daily., Disp: 180 tablet, Rfl: 1 .  Calcium Carb-Cholecalciferol (CALCIUM 600/VITAMIN D PO), Take 600 mg by mouth in the morning and at bedtime., Disp: , Rfl:  .  cetirizine (ZYRTEC) 10 MG tablet, Take 10 mg by mouth daily., Disp: , Rfl:  .  Cholecalciferol (VITAMIN D) 125 MCG (5000 UT) CAPS, Take 5,000 Units by mouth daily., Disp: , Rfl:  .  Coenzyme Q10 (COQ10) 100 MG CAPS, Take 100 mg by mouth at bedtime. , Disp: , Rfl:  .  cyclobenzaprine (FLEXERIL) 10 MG tablet, Take 5 mg by mouth See admin instructions. Take 5 mg at night, may take a second 5 mg dose during the day as needed for muscle spasms, Disp: , Rfl:  .  diltiazem (CARDIZEM) 30 MG tablet, Take 1 tablet every 4 hours AS NEEDED for AFIB heart rate >100, Disp: 30 tablet, Rfl: 3 .  fluticasone (FLONASE) 50 MCG/ACT nasal spray, Place 1 spray into both nostrils daily. , Disp: , Rfl:  .  furosemide (LASIX) 20 MG tablet, TAKE 1 TABLET BY MOUTH DAILY AS  NEEDED FOR SWELLING, Disp: 100 tablet, Rfl: 2 .  gabapentin (NEURONTIN) 300 MG capsule, Take 300 mg by mouth 3 (three) times daily., Disp: , Rfl:  .  lisinopril (ZESTRIL) 30 MG tablet, Take 1 tablet (30 mg total) by mouth in the morning. (Patient taking differently: Take 20 mg by mouth in the morning.), Disp: , Rfl:  .  Magnesium 100 MG TABS, Take 50 mg by mouth at bedtime., Disp: , Rfl:  .  melatonin 5 MG TABS, Take 2.5 mg by mouth at bedtime as needed (Sleep).,  Disp: , Rfl:  .  metoprolol succinate (TOPROL-XL) 100 MG 24 hr tablet, Take 1 tablet (100 mg total) by mouth in the morning., Disp: , Rfl:  .  Multiple Vitamins-Minerals (CENTRUM SILVER ADULT 50+ PO), Take 1 tablet by mouth daily., Disp: , Rfl:  .  Omega-3 Fatty Acids (FISH OIL) 1000 MG CAPS, Take 1,000 mg by mouth daily., Disp: , Rfl:  .  pantoprazole (PROTONIX) 40 MG tablet, Take 1 tablet (40 mg total) by mouth daily., Disp: 90 tablet, Rfl: 3 .  polyethylene glycol powder (GLYCOLAX/MIRALAX) 17 GM/SCOOP powder, Take 17 g by mouth daily as needed for mild constipation or moderate constipation (With lunch)., Disp: , Rfl:  .  Polyvinyl Alcohol-Povidone PF (REFRESH) 1.4-0.6 % SOLN, Apply 1 drop to eye as needed., Disp: , Rfl:  .  simvastatin (ZOCOR) 20 MG tablet, Take 20 mg by mouth at bedtime., Disp: , Rfl:  .  sodium chloride (OCEAN) 0.65 % SOLN nasal spray, Place 1 spray into both nostrils See admin instructions. Use 1 spray in each nostril in the morning may use a second dose at night as needed for congestion, Disp: , Rfl:  .  triamcinolone (KENALOG) 0.1 %, Apply 1 application topically daily as needed (irritation)., Disp: , Rfl:  .  vitamin C (ASCORBIC ACID) 500 MG tablet, Take 1,000 mg by mouth 2 (two) times daily., Disp: , Rfl:  .  Vitamins A & D (VITAMIN A & D) ointment, Apply 1 application topically daily as needed for dry skin (irritation)., Disp: , Rfl:    Allergies: Allergies  Allergen Reactions  . Morphine And Codeine Nausea And Vomiting  . Zofran [Ondansetron]     Causes minor constipation, tolerates low doses      REVIEW  OF SYSTEMS:   Review of Systems  Constitutional:  Negative for chills, fatigue and fever.  HENT:   Negative for lump/mass, mouth sores, nosebleeds, sore throat and trouble swallowing.   Eyes:  Negative for eye problems.  Respiratory:  Negative for cough and shortness of breath.   Cardiovascular:  Negative for chest pain, leg swelling and palpitations.   Gastrointestinal:  Negative for abdominal pain, constipation, diarrhea, nausea and vomiting.  Genitourinary:  Negative for bladder incontinence, difficulty urinating, dysuria, frequency, hematuria and nocturia.   Musculoskeletal:  Negative for arthralgias, back pain, flank pain, myalgias and neck pain.  Skin:  Negative for itching and rash.  Neurological:  Negative for dizziness, headaches and numbness.  Hematological:  Does not bruise/bleed easily.  Psychiatric/Behavioral:  Negative for depression, sleep disturbance and suicidal ideas. The patient is not nervous/anxious.   All other systems reviewed and are negative.    VITALS:   There were no vitals taken for this visit.  Wt Readings from Last 3 Encounters:  01/06/23 170 lb 3.2 oz (77.2 kg)  12/18/22 171 lb 6.4 oz (77.7 kg)  11/02/22 172 lb (78 kg)    There is no height or weight on file to calculate BMI.  Performance status (ECOG): 1 - Symptomatic but completely ambulatory  PHYSICAL EXAM:   Physical Exam Vitals and nursing note reviewed. Exam conducted with a chaperone present.  Constitutional:      Appearance: Normal appearance.  Cardiovascular:     Rate and Rhythm: Normal rate and regular rhythm.     Pulses: Normal pulses.     Heart sounds: Normal heart sounds.  Pulmonary:     Effort: Pulmonary effort is normal.     Breath sounds: Normal breath sounds.  Abdominal:     Palpations: Abdomen is soft. There is no hepatomegaly, splenomegaly or mass.     Tenderness: There is no abdominal tenderness.  Musculoskeletal:     Right lower leg: No edema.     Left lower leg: No edema.  Lymphadenopathy:     Cervical: No cervical adenopathy.     Right cervical: No superficial, deep or posterior cervical adenopathy.    Left cervical: No superficial, deep or posterior cervical adenopathy.     Upper Body:     Right upper body: No supraclavicular or axillary adenopathy.     Left upper body: No supraclavicular or axillary adenopathy.   Neurological:     General: No focal deficit present.     Mental Status: She is alert and oriented to person, place, and time.  Psychiatric:        Mood and Affect: Mood normal.        Behavior: Behavior normal.    LABS:      Latest Ref Rng & Units 03/06/2023    9:31 AM 11/02/2022    7:30 PM 08/18/2022    1:16 PM  CBC  WBC 4.0 - 10.5 K/uL 5.5  9.5  7.6   Hemoglobin 12.0 - 15.0 g/dL 40.9  81.1  91.4   Hematocrit 36.0 - 46.0 % 40.0  37.2  37.5   Platelets 150 - 400 K/uL 325  314  314       Latest Ref Rng & Units 03/06/2023    9:31 AM 11/02/2022    7:30 PM 08/18/2022    1:16 PM  CMP  Glucose 70 - 99 mg/dL 96  97  97   BUN 8 - 23 mg/dL 12  20  16    Creatinine 0.44 -  1.00 mg/dL 4.01  0.27  2.53   Sodium 135 - 145 mmol/L 132  135  134   Potassium 3.5 - 5.1 mmol/L 4.3  3.6  4.1   Chloride 98 - 111 mmol/L 97  102  98   CO2 22 - 32 mmol/L 24  22  24    Calcium 8.9 - 10.3 mg/dL 9.5  9.1  9.2   Total Protein 6.5 - 8.1 g/dL 7.8   7.6   Total Bilirubin 0.3 - 1.2 mg/dL 0.7   0.7   Alkaline Phos 38 - 126 U/L 57   56   AST 15 - 41 U/L 28   27   ALT 0 - 44 U/L 25   21      No results found for: "CEA1", "CEA" / No results found for: "CEA1", "CEA" No results found for: "PSA1" No results found for: "GUY403" Lab Results  Component Value Date   CAN125 32.9 03/06/2023    No results found for: "TOTALPROTELP", "ALBUMINELP", "A1GS", "A2GS", "BETS", "BETA2SER", "GAMS", "MSPIKE", "SPEI" Lab Results  Component Value Date   TIBC 444 05/29/2020   FERRITIN 40 05/29/2020   IRONPCTSAT 17 05/29/2020   No results found for: "LDH"   STUDIES:   No results found.

## 2023-03-21 DIAGNOSIS — H6990 Unspecified Eustachian tube disorder, unspecified ear: Secondary | ICD-10-CM | POA: Diagnosis not present

## 2023-03-23 DIAGNOSIS — I10 Essential (primary) hypertension: Secondary | ICD-10-CM | POA: Diagnosis not present

## 2023-03-23 DIAGNOSIS — K2100 Gastro-esophageal reflux disease with esophagitis, without bleeding: Secondary | ICD-10-CM | POA: Diagnosis not present

## 2023-03-23 DIAGNOSIS — I4891 Unspecified atrial fibrillation: Secondary | ICD-10-CM | POA: Diagnosis not present

## 2023-04-16 ENCOUNTER — Telehealth: Payer: Self-pay | Admitting: Cardiology

## 2023-04-16 MED ORDER — APIXABAN 5 MG PO TABS: 5.0000 mg | ORAL_TABLET | Freq: Two times a day (BID) | ORAL | 0 refills | Status: DC

## 2023-04-16 NOTE — Telephone Encounter (Signed)
Pt c/o medication issue:  1. Name of Medication:   apixaban (ELIQUIS) 5 MG TABS tablet   2. How are you currently taking this medication (dosage and times per day)?   As prescribed  3. Are you having a reaction (difficulty breathing--STAT)?  No  4. What is your medication issue?   Patient stated she has enough of this medication for 4-5 days.  Patient stated she is  completing the application with her husband to get this medication from the company but will need samples to cover her until she gets her medication.

## 2023-04-16 NOTE — Telephone Encounter (Signed)
Sample request for Eliquis received. Indication: PAF Last office visit: 01/06/23  Dominga Ferry MD Scr: 0.72 on 03/06/23  Epic Age: 77 Weight: 77.2kg  Based on above findings Eliquis 5mg  twice daily is the appropriate dose.  OK to provide samples if available.

## 2023-04-16 NOTE — Telephone Encounter (Signed)
Enough samples given for 14 days advised patient to bring PAF back As soon as possible

## 2023-04-16 NOTE — Telephone Encounter (Signed)
Spoke with patient she stated she called [employer] and they mailed her Patient assistance forms in the mail to her she got them yesterday because they are now both in the doughnut hole. Patient is requesting sample of this medication for 1 month sending to lisa for approval

## 2023-04-21 ENCOUNTER — Other Ambulatory Visit (HOSPITAL_COMMUNITY): Payer: Self-pay | Admitting: Family Medicine

## 2023-04-21 DIAGNOSIS — Z1231 Encounter for screening mammogram for malignant neoplasm of breast: Secondary | ICD-10-CM

## 2023-04-22 DIAGNOSIS — L821 Other seborrheic keratosis: Secondary | ICD-10-CM | POA: Diagnosis not present

## 2023-04-22 DIAGNOSIS — L570 Actinic keratosis: Secondary | ICD-10-CM | POA: Diagnosis not present

## 2023-04-28 ENCOUNTER — Other Ambulatory Visit: Payer: Self-pay | Admitting: *Deleted

## 2023-04-28 MED ORDER — APIXABAN 5 MG PO TABS: 5.0000 mg | ORAL_TABLET | Freq: Two times a day (BID) | ORAL | 3 refills | Status: DC

## 2023-04-30 NOTE — Telephone Encounter (Signed)
Detailed voice message left that she & her husband need to sign new pages when she comes to drop off tax info.

## 2023-04-30 NOTE — Telephone Encounter (Signed)
Patient is following up. She states [name] received the application but several parts of the application were ineligible or pages were cut off. She would like to have it sent again. She states a call may be returned to [facility] at [phone] to clarify what information was missing. She also mentions that she plans to stop by the [facility] office this afternoon or tomorrow morning to provide tax return information.

## 2023-05-11 MED ORDER — APIXABAN 5 MG PO TABS: 5.0000 mg | ORAL_TABLET | Freq: Two times a day (BID) | ORAL | Status: DC

## 2023-05-11 NOTE — Addendum Note (Signed)
Addended by: Lesle Chris on: 05/11/2023 02:23 PM   Modules accepted: Orders

## 2023-05-11 NOTE — Telephone Encounter (Signed)
Patient calling the office for samples of medication:   1.  What medication and dosage are you requesting samples for? Eliquis  2.  Are you currently out of this medication?   Patient is returning call + requesting additional samples of Eliquis if possible. She states she is completely out of medication.

## 2023-05-11 NOTE — Telephone Encounter (Signed)
Notified 2 boxes ready for pick up.

## 2023-05-14 ENCOUNTER — Telehealth: Payer: Self-pay | Admitting: *Deleted

## 2023-05-14 NOTE — Telephone Encounter (Signed)
Received fax from [facility] - Eliquis approved from 05/12/2023 through 06/23/2023.

## 2023-05-20 ENCOUNTER — Ambulatory Visit (HOSPITAL_COMMUNITY)
Admission: RE | Admit: 2023-05-20 | Discharge: 2023-05-20 | Disposition: A | Discharge status: Home / Self Care | Payer: Medicare Other | Source: Ambulatory Visit | Attending: Family Medicine | Admitting: Family Medicine

## 2023-05-20 DIAGNOSIS — Z1231 Encounter for screening mammogram for malignant neoplasm of breast: Secondary | ICD-10-CM | POA: Insufficient documentation

## 2023-05-24 ENCOUNTER — Other Ambulatory Visit: Payer: Self-pay

## 2023-05-24 ENCOUNTER — Emergency Department (HOSPITAL_COMMUNITY)
Admission: EM | Admit: 2023-05-24 | Discharge: 2023-05-24 | Disposition: A | Discharge status: Home / Self Care | Payer: Medicare Other | Attending: Emergency Medicine | Admitting: Emergency Medicine

## 2023-05-24 ENCOUNTER — Emergency Department (HOSPITAL_COMMUNITY): Payer: Medicare Other

## 2023-05-24 DIAGNOSIS — I480 Paroxysmal atrial fibrillation: Secondary | ICD-10-CM | POA: Insufficient documentation

## 2023-05-24 DIAGNOSIS — Z79899 Other long term (current) drug therapy: Secondary | ICD-10-CM | POA: Diagnosis not present

## 2023-05-24 DIAGNOSIS — I10 Essential (primary) hypertension: Secondary | ICD-10-CM | POA: Diagnosis not present

## 2023-05-24 DIAGNOSIS — R0600 Dyspnea, unspecified: Secondary | ICD-10-CM

## 2023-05-24 DIAGNOSIS — R600 Localized edema: Secondary | ICD-10-CM | POA: Diagnosis not present

## 2023-05-24 DIAGNOSIS — R0602 Shortness of breath: Secondary | ICD-10-CM | POA: Diagnosis not present

## 2023-05-24 DIAGNOSIS — R0989 Other specified symptoms and signs involving the circulatory and respiratory systems: Secondary | ICD-10-CM | POA: Diagnosis not present

## 2023-05-24 DIAGNOSIS — I498 Other specified cardiac arrhythmias: Secondary | ICD-10-CM | POA: Diagnosis not present

## 2023-05-24 DIAGNOSIS — R002 Palpitations: Secondary | ICD-10-CM | POA: Diagnosis present

## 2023-05-24 LAB — BASIC METABOLIC PANEL
Anion gap: 12 (ref 5–15)
BUN: 18 mg/dL (ref 8–23)
CO2: 22 mmol/L (ref 22–32)
Calcium: 9.5 mg/dL (ref 8.9–10.3)
Chloride: 101 mmol/L (ref 98–111)
Creatinine, Ser: 0.61 mg/dL (ref 0.44–1.00)
GFR, Estimated: >60 mL/min (ref >60)
Glucose, Bld: 98 mg/dL (ref 70–99)
Potassium: 3.8 mmol/L (ref 3.5–5.1)
Sodium: 135 mmol/L (ref 135–145)

## 2023-05-24 LAB — CBC
HCT: 39.1 % (ref 36.0–46.0)
Hemoglobin: 13.5 g/dL (ref 12.0–15.0)
MCH: 33.5 pg (ref 26.0–34.0)
MCHC: 34.5 g/dL (ref 30.0–36.0)
MCV: 97 fL (ref 80.0–100.0)
Platelets: 330 10*3/uL (ref 150–400)
RBC: 4.03 MIL/uL (ref 3.87–5.11)
RDW: 12.8 % (ref 11.5–15.5)
WBC: 8.2 10*3/uL (ref 4.0–10.5)
nRBC: 0 % (ref 0.0–0.2)

## 2023-05-24 LAB — MAGNESIUM: Magnesium: 2.1 mg/dL (ref 1.7–2.4)

## 2023-05-24 LAB — TSH: TSH: 2.109 u[IU]/mL (ref 0.350–4.500)

## 2023-05-24 MED ORDER — ETOMIDATE 2 MG/ML IV SOLN
10.0000 mg | Freq: Once | INTRAVENOUS | Status: AC
  Administered 2023-05-24: 10 mg via INTRAVENOUS
  Filled 2023-05-24: qty 10

## 2023-05-24 MED ORDER — ALBUTEROL SULFATE HFA 108 (90 BASE) MCG/ACT IN AERS: 2.0000 | INHALATION_SPRAY | RESPIRATORY_TRACT | Status: DC | PRN

## 2023-05-24 NOTE — ED Notes (Signed)
Edp at bedside during traige

## 2023-05-24 NOTE — ED Provider Notes (Signed)
EMERGENCY DEPARTMENT AT [facility] Provider Note   CSN: [id number] Arrival date & time: 05/24/23  1018     History  Chief Complaint  Patient presents with   Shortness of Breath    April Guerra is a 77 y.o. female.  76 year old female with a history of ovarian cancer under surveillance, paroxysmal atrial fibrillation on as needed diltiazem and Eliquis, hypertension, and hyperlipidemia who presents to the emergency department with palpitations.  Since Friday has been having off-and-on palpitations.  Says that she gets somewhat short of breath with it as well.  No chest pain.  Says that she feels generally weak because of this.  Did have a cardioversion in May and was feeling much better.  Has been on Eliquis since then without any missed doses.  Has taken diltiazem as needed several times for this but reports that the palpitations come right back.  Ate breakfast at 8 AM.  Requesting cardioversion today.       Home Medications Prior to Admission medications   Medication Sig Start Date End Date Taking? Authorizing Provider  famotidine (PEPCID) 40 MG tablet Take 40 mg by mouth daily. 03/17/23  Yes [provider]  lisinopril (ZESTRIL) 20 MG tablet Take 20 mg by mouth daily. 02/24/23  Yes [provider]  acetaminophen (TYLENOL) 500 MG tablet Take 1,000 mg by mouth 3 (three) times daily. Patient not taking: Reported on 03/13/2023    [provider]  albuterol (VENTOLIN HFA) 108 (90 Base) MCG/ACT inhaler Inhale 2 puffs into the lungs every 4 (four) hours as needed for wheezing or shortness of breath. 05/09/20   Shon Hale, MD  amLODipine (NORVASC) 10 MG tablet Take 1 tablet (10 mg total) by mouth daily with lunch. For BP 05/09/20   Emokpae, Courage, MD  apixaban (ELIQUIS) 5 MG TABS tablet Take 1 tablet (5 mg total) by mouth 2 (two) times daily. 05/11/23   Antoine Poche, MD  Calcium Carb-Cholecalciferol (CALCIUM 600/VITAMIN D PO) Take  600 mg by mouth in the morning and at bedtime.    [provider]  cetirizine (ZYRTEC) 10 MG tablet Take 10 mg by mouth daily.    [provider]  Cholecalciferol (VITAMIN D) 125 MCG (5000 UT) CAPS Take 5,000 Units by mouth daily.    [provider]  Coenzyme Q10 (COQ10) 100 MG CAPS Take 100 mg by mouth at bedtime.     [provider]  cyclobenzaprine (FLEXERIL) 10 MG tablet Take 5 mg by mouth See admin instructions. Take 5 mg at night, may take a second 5 mg dose during the day as needed for muscle spasms Patient not taking: Reported on 03/13/2023 04/22/19   [provider]  diltiazem (CARDIZEM) 30 MG tablet Take 1 tablet every 4 hours AS NEEDED for AFIB heart rate >100 Patient not taking: Reported on 03/13/2023 01/22/22   Antoine Poche, MD  fluticasone (FLONASE) 50 MCG/ACT nasal spray Place 1 spray into both nostrils daily.  Patient not taking: Reported on 03/13/2023 11/11/19   [provider]  furosemide (LASIX) 20 MG tablet TAKE 1 TABLET BY MOUTH DAILY AS  NEEDED FOR SWELLING Patient not taking: Reported on 03/13/2023 06/02/22   Antoine Poche, MD  gabapentin (NEURONTIN) 300 MG capsule Take 300 mg by mouth daily.    [provider]  Magnesium 100 MG TABS Take 50 mg by mouth at bedtime.    [provider]  melatonin 5 MG TABS Take 2.5 mg  by mouth at bedtime as needed (Sleep).    [provider]  metoprolol succinate (TOPROL-XL) 100 MG 24 hr tablet Take 1 tablet (100 mg total) by mouth in the morning. 11/03/22   Antoine Poche, MD  Multiple Vitamins-Minerals (CENTRUM SILVER ADULT 50+ PO) Take 1 tablet by mouth daily.    [provider]  Omega-3 Fatty Acids (FISH OIL) 1000 MG CAPS Take 1,000 mg by mouth daily.    [provider]  pantoprazole (PROTONIX) 40 MG tablet Take 1 tablet (40 mg total) by mouth daily. 08/07/20 01/23/79  Dolores Frame, MD  polyethylene glycol powder  (GLYCOLAX/MIRALAX) 17 GM/SCOOP powder Take 17 g by mouth daily as needed for mild constipation or moderate constipation (With lunch).    [provider]  Polyvinyl Alcohol-Povidone PF (REFRESH) 1.4-0.6 % SOLN Apply 1 drop to eye as needed.    [provider]  simvastatin (ZOCOR) 20 MG tablet Take 20 mg by mouth at bedtime. 05/28/21   [provider]  sodium chloride (OCEAN) 0.65 % SOLN nasal spray Place 1 spray into both nostrils See admin instructions. Use 1 spray in each nostril in the morning may use a second dose at night as needed for congestion    [provider]  triamcinolone (KENALOG) 0.1 % Apply 1 application topically daily as needed (irritation). 05/22/20   [provider]  vitamin C (ASCORBIC ACID) 500 MG tablet Take 1,000 mg by mouth 2 (two) times daily.    [provider]  Vitamins A & D (VITAMIN A & D) ointment Apply 1 application topically daily as needed for dry skin (irritation).    [provider]  prochlorperazine (COMPAZINE) 10 MG tablet Take 1 tablet (10 mg total) by mouth every 6 (six) hours as needed (Nausea or vomiting). 05/29/20 12/14/21  Serena Croissant, MD      Allergies    Morphine and codeine and Zofran [ondansetron]    Review of Systems   Review of Systems  Physical Exam Updated Vital Signs BP (!) 140/81   Pulse 64   Temp 98.1 F (36.7 C) (Oral)   Resp 17   Ht 4\' 9"  (1.448 m)   Wt 74.8 kg   SpO2 93%   BMI 35.71 kg/m  Physical Exam Vitals and nursing note reviewed.  Constitutional:      General: She is not in acute distress.    Appearance: She is well-developed.  HENT:     Head: Normocephalic and atraumatic.     Right Ear: External ear normal.     Left Ear: External ear normal.     Nose: Nose normal.  Eyes:     Extraocular Movements: Extraocular movements intact.     Conjunctiva/sclera: Conjunctivae normal.     Pupils: Pupils are equal, round, and reactive to light.  Cardiovascular:      Rate and Rhythm: Tachycardia present. Rhythm irregular.     Heart sounds: No murmur heard. Pulmonary:     Effort: Pulmonary effort is normal. No respiratory distress.     Breath sounds: Normal breath sounds.  Abdominal:     General: Abdomen is flat.     Palpations: Abdomen is soft.  Musculoskeletal:     Cervical back: Normal range of motion and neck supple.     Right lower leg: Edema (1+) present.     Left lower leg: Edema (1+) present.  Skin:    General: Skin is warm and dry.  Neurological:     Mental Status:  She is alert and oriented to person, place, and time. Mental status is at baseline.  Psychiatric:        Mood and Affect: Mood normal.     ED Results / Procedures / Treatments   Labs (all labs ordered are listed, but only abnormal results are displayed) Labs Reviewed  BASIC METABOLIC PANEL  MAGNESIUM  CBC  TSH    EKG EKG Interpretation Date/Time:  Sunday May 24 2023 10:54:37 EST Ventricular Rate:  105 PR Interval:    QRS Duration:  83 QT Interval:  341 QTC Calculation: 451 R Axis:   0  Text Interpretation: Atrial fibrillation Left ventricular hypertrophy Anterior Q waves, possibly due to LVH Confirmed by Vonita Moss [phone]) on 05/24/2023 12:23:38 PM  Radiology DG Chest Portable 1 View  Result Date: 05/24/2023 CLINICAL DATA:  Several day history of heart flutter and shortness of breath EXAM: PORTABLE CHEST 1 VIEW COMPARISON:  Chest radiograph dated 11/02/2022 FINDINGS: Right chest wall port tip projects over the SVC. Mildly low lung volumes. Prominent medial right apical mediastinal contour. No pleural effusion or pneumothorax. Similar mildly enlarged cardiomediastinal silhouette. No acute osseous abnormality. IMPRESSION: 1. Prominent medial right apical mediastinal contour, which may be related to patient positioning. Lymphadenopathy can have a similar appearance. No focal consolidations. 2. Similar mildly enlarged cardiomediastinal silhouette.  Electronically Signed   By: Agustin Cree M.D.   On: 05/24/2023 11:57    Procedures .Cardioversion  Date/Time: 05/24/2023 5:52 PM  Performed by: Rondel Baton, MD Authorized by: Rondel Baton, MD   Consent:    Consent obtained:  Verbal and written   Consent given by:  Patient   Risks discussed:  Cutaneous burn, death, induced arrhythmia and pain   Alternatives discussed:  Rate-control medication, referral and delayed treatment Pre-procedure details:    Cardioversion basis:  Elective   Rhythm:  Atrial fibrillation   Electrode placement:  Anterior-posterior Patient sedated: Yes. Refer to sedation procedure documentation for details of sedation.  Attempt one:    Cardioversion mode:  Synchronous   Waveform:  Monophasic   Shock (Joules):  120   Shock outcome:  Conversion to normal sinus rhythm Post-procedure details:    Patient status:  Awake   Patient tolerance of procedure:  Tolerated well, no immediate complications .Sedation  Date/Time: 05/24/2023 3:25 PM  Performed by: Rondel Baton, MD Authorized by: Rondel Baton, MD   Consent:    Consent obtained:  Verbal and written   Consent given by:  Patient   Risks discussed:  Prolonged hypoxia resulting in organ damage, dysrhythmia, inadequate sedation, respiratory compromise necessitating ventilatory assistance and intubation, nausea and vomiting Universal protocol:    Immediately prior to procedure, a time out was called: yes   Indications:    Procedure performed:  Cardioversion   Procedure necessitating sedation performed by:  Physician performing sedation Pre-sedation assessment:    Time since last food or drink:  6 hours   ASA classification: class 2 - patient with mild systemic disease     Mouth opening:  3 or more finger widths   Mallampati score:  II - soft palate, uvula, fauces visible   Neck mobility: normal     Pre-sedation assessments completed and reviewed: pre-procedure airway patency not reviewed  and pre-procedure mental status not reviewed   A pre-sedation assessment was completed prior to the start of the procedure Immediate pre-procedure details:    Reassessment: Patient reassessed immediately prior to procedure   Procedure details (see MAR for  exact dosages):    Sedation:  Etomidate   Intended level of sedation: deep   Total Provider sedation time (minutes):  10 Post-procedure details:   A post-sedation assessment was completed following the completion of the procedure.   Attendance: Constant attendance by certified staff until patient recovered     Recovery: Patient returned to pre-procedure baseline     Post-sedation assessments completed and reviewed: post-procedure airway patency not reviewed and post-procedure mental status not reviewed     Patient is stable for discharge or admission: yes     Procedure completion:  Tolerated well, no immediate complications     Medications Ordered in ED Medications  albuterol (VENTOLIN HFA) 108 (90 Base) MCG/ACT inhaler 2 puff (has no administration in time range)  etomidate (AMIDATE) injection 10 mg (10 mg Intravenous Given 05/24/23 1528)    ED Course/ Medical Decision Making/ A&P                                 Medical Decision Making Amount and/or Complexity of Data Reviewed Labs: ordered. Radiology: ordered.  Risk Prescription drug management.   DANAYSIA SCAVONE is a 77 y.o. female with comorbidities that complicate the patient evaluation including ovarian cancer under surveillance, paroxysmal atrial fibrillation on as needed diltiazem and Eliquis, hypertension, and hyperlipidemia who presents to the emergency department with palpitations.   Initial Ddx:  A-fib RVR, paroxysmal atrial fibrillation, MI, PE, electrolyte abnormality  MDM/Course:  Patient has a history of paroxysmal atrial fibrillation and presents with persistent and symptomatic atrial fibrillation and palpitations over the past few days.  Is already  anticoagulated and has been anticoagulated for over a month.  This makes PE less likely and she is also a candidate for cardioversion.  Unfortunately she ate just prior to coming to the emergency department so had to wait for 6 hours but eventually was electively cardioverted and was back in normal sinus rhythm.  Lab work was sent and was unremarkable.  Will have her follow-up with her outpatient cardiologist.  Upon re-evaluation back to her baseline and is requesting to go home.  This patient presents to the ED for concern of complaints listed in HPI, this involves an extensive number of treatment options, and is a complaint that carries with it a high risk of complications and morbidity. Disposition including potential need for admission considered.   Dispo: DC Home. Return precautions discussed including, but not limited to, those listed in the AVS. Allowed pt time to ask questions which were answered fully prior to dc.  Additional history obtained from daughter Records reviewed Outpatient Clinic Notes The following labs were independently interpreted: Chemistry and show no acute abnormality I independently reviewed the following imaging with scope of interpretation limited to determining acute life threatening conditions related to emergency care: Chest x-ray and agree with the radiologist interpretation with the following exceptions: none I personally reviewed and interpreted cardiac monitoring: normal sinus rhythm  and atrial fibrillation with RVR I personally reviewed and interpreted the pt's EKG: see above for interpretation  I have reviewed the patients home medications and made adjustments as needed Social Determinants of health:  Elderly  Portions of this note were generated with Scientist, clinical (histocompatibility and immunogenetics). Dictation errors may occur despite best attempts at proofreading.      Final Clinical Impression(s) / ED Diagnoses Final diagnoses:  Paroxysmal atrial fibrillation (HCC)   Dyspnea, unspecified type    Rx / DC Orders  ED Discharge Orders          Ordered    Ambulatory referral to Cardiology        05/24/23 1557              Rondel Baton, MD 05/24/23 1755

## 2023-05-24 NOTE — Discharge Instructions (Signed)
You were seen for your atrial fibrillation in the emergency department.  You had an electrical cardioversion in the emergency department.  At home, please continue taking your Eliquis for at least 1 month.  Take your diltiazem as needed.    Check your MyChart online for the results of any tests that had not resulted by the time you left the emergency department.   Follow-up with your cardiologist in 1 week.  Return immediately to the emergency department if you experience any of the following: Palpitations, difficulty breathing, or any other concerning symptoms.    Thank you for visiting our Emergency Department. It was a pleasure taking care of you today.

## 2023-05-24 NOTE — ED Triage Notes (Signed)
Pt arrived POV with c/o SOB and feeling her heart flutter x days. Pt has taken cardizem at times in those 3 days. Pt is on Eliquis.

## 2023-05-25 ENCOUNTER — Telehealth: Payer: Self-pay | Admitting: Cardiology

## 2023-05-25 ENCOUNTER — Encounter: Payer: Self-pay | Admitting: *Deleted

## 2023-05-25 NOTE — Telephone Encounter (Signed)
  Pt said, she went to ED recently and she had a cardioversion. she would like for Dr. Wyline Mood to know and if she need to do a follow up with him

## 2023-05-27 NOTE — Telephone Encounter (Signed)
Patient is calling back, upset that no one has called her back. Please advise

## 2023-05-27 NOTE — Telephone Encounter (Signed)
Approved by JB patient will be seen in the  office 12/18 @2 :40

## 2023-06-03 DIAGNOSIS — H9201 Otalgia, right ear: Secondary | ICD-10-CM | POA: Diagnosis not present

## 2023-06-03 DIAGNOSIS — M25512 Pain in left shoulder: Secondary | ICD-10-CM | POA: Diagnosis not present

## 2023-06-04 ENCOUNTER — Telehealth: Payer: Self-pay | Admitting: Cardiology

## 2023-06-04 NOTE — Telephone Encounter (Signed)
Patient cannot moved apt on 12/17. I have sent secure chat to Dr.Branch to see when he could see patient.

## 2023-06-04 NOTE — Telephone Encounter (Signed)
Patient April Guerra going to call her other appointment and see if they can move her time on the 17 th.

## 2023-06-04 NOTE — Telephone Encounter (Signed)
Patient calling to verify appt. She states she was told her appt will be on 18th, since on 17th she had another appt at the same time. Patient want a nurse to give her a call back because she can't make the 17th. Please advise

## 2023-06-04 NOTE — Telephone Encounter (Signed)
12/20 at 2:40 pm with Dr.Branch  Got machine,left info.

## 2023-06-04 NOTE — Telephone Encounter (Signed)
I called patient back and there is no answer, will try again later.

## 2023-06-05 ENCOUNTER — Inpatient Hospital Stay: Payer: Medicare Other

## 2023-06-09 ENCOUNTER — Ambulatory Visit: Payer: Medicare Other | Admitting: Cardiology

## 2023-06-11 ENCOUNTER — Other Ambulatory Visit: Payer: Self-pay | Admitting: Gynecologic Oncology

## 2023-06-11 DIAGNOSIS — C561 Malignant neoplasm of right ovary: Secondary | ICD-10-CM

## 2023-06-12 ENCOUNTER — Encounter: Payer: Self-pay | Admitting: Gynecologic Oncology

## 2023-06-12 ENCOUNTER — Inpatient Hospital Stay: Payer: Medicare Other | Attending: Gynecologic Oncology | Admitting: Gynecologic Oncology

## 2023-06-12 ENCOUNTER — Encounter: Payer: Self-pay | Admitting: Cardiology

## 2023-06-12 ENCOUNTER — Inpatient Hospital Stay: Payer: Medicare Other

## 2023-06-12 ENCOUNTER — Ambulatory Visit: Payer: Medicare Other | Attending: Cardiology | Admitting: Cardiology

## 2023-06-12 VITALS — BP 185/62 | HR 56 | Temp 97.8°F | Resp 18 | Ht <= 58 in | Wt 170.0 lb

## 2023-06-12 VITALS — BP 144/74 | HR 69 | Ht <= 58 in | Wt 172.2 lb

## 2023-06-12 DIAGNOSIS — Z90710 Acquired absence of both cervix and uterus: Secondary | ICD-10-CM | POA: Diagnosis not present

## 2023-06-12 DIAGNOSIS — I10 Essential (primary) hypertension: Secondary | ICD-10-CM

## 2023-06-12 DIAGNOSIS — I480 Paroxysmal atrial fibrillation: Secondary | ICD-10-CM | POA: Diagnosis not present

## 2023-06-12 DIAGNOSIS — Z8543 Personal history of malignant neoplasm of ovary: Secondary | ICD-10-CM

## 2023-06-12 DIAGNOSIS — D6869 Other thrombophilia: Secondary | ICD-10-CM

## 2023-06-12 DIAGNOSIS — R32 Unspecified urinary incontinence: Secondary | ICD-10-CM | POA: Diagnosis not present

## 2023-06-12 DIAGNOSIS — C561 Malignant neoplasm of right ovary: Secondary | ICD-10-CM

## 2023-06-12 DIAGNOSIS — Z9221 Personal history of antineoplastic chemotherapy: Secondary | ICD-10-CM

## 2023-06-12 DIAGNOSIS — Z90722 Acquired absence of ovaries, bilateral: Secondary | ICD-10-CM

## 2023-06-12 MED ORDER — DILTIAZEM HCL 30 MG PO TABS: ORAL_TABLET | ORAL | 3 refills | Status: DC

## 2023-06-12 NOTE — Progress Notes (Signed)
Gynecologic Oncology Return Clinic Visit  06/12/23  Reason for Visit: surveillance in the setting of a history of ovarian cancer    Treatment History: Oncology History  Right ovarian epithelial cancer (HCC)  05/03/2020 Initial Diagnosis   Right ovarian epithelial cancer (HCC)   05/03/2020 Cancer Staging   Staging form: Ovary, Fallopian Tube, and Primary Peritoneal Carcinoma, AJCC 8th Edition - Clinical stage from 05/03/2020: FIGO Stage II, calculated as Stage Unknown (cT2b, cNX, cM0) - Signed by Adolphus Birchwood, MD on 05/24/2020   06/06/2020 - 09/21/2020 Chemotherapy   Patient is on Treatment Plan : OVARIAN Carboplatin (AUC 6) / Paclitaxel (175) q21d x 6 cycles      Genetic Testing   Negative genetic testing. No pathogenic variants identified on the [employer]. The report date is 08/10/2020.  The CancerNext gene panel offered by [employer] includes sequencing and rearrangement analysis for the following 36 genes: APC*, ATM*, AXIN2, BARD1, BMPR1A, BRCA1*, BRCA2*, BRIP1*, CDH1*, CDK4, CDKN2A, CHEK2*, DICER1, MLH1*, MSH2*, MSH3, MSH6*, MUTYH*, NBN, NF1*, NTHL1, PALB2*, PMS2*, PTEN*, RAD51C*, RAD51D*, RECQL, SMAD4, SMARCA4, STK11 and TP53* (sequencing and deletion/duplication); HOXB13, POLD1 and POLE (sequencing only); EPCAM and GREM1 (deletion/duplication only).   Somatic genes analyzed through TumorNext-HRD: ATM, BARD1, BRCA1, BRCA2, BRIP1, CHEK2, MRE11A, NBN, PALB2, RAD51C, RAD51D.    The patient reported approximately 35-month history of progressive urinary frequency.  This was initially worked up by testing and treating empirically for urinary tract infections.  When that failed to resolve her symptoms she was referred to a urologist, Dr. Annabell Howells, who performed a pelvic examination which palpated a mass in the pelvis.  Due to concern that her urinary frequency symptoms were secondary to extrinsic compression a CT scan of the abdomen and pelvis was ordered.  Of note her  urinary analysis was fairly unremarkable.   CT abdomen and pelvis performed at [facility] urology on 04/11/2020 showed a 14.5 x 14 x 12 cm solid and cystic right ovarian mass worrisome for ovarian carcinoma.  There were no findings for intraperitoneal metastatic disease.  There is no lymphadenopathy present.   A transvaginal ultrasound on 04/12/2020 confirmed this finding of an ovarian mass.  The uterus itself measured 7.2 x 3.2 x 4.6 cm with a 6 mm endometrium.  The right ovary was not visualized.  The left ovary was not visualized.  There was a large complex cystic mass filling the pelvis measuring 14.5 x 10.9 x 13.4 cm.  The majority of this was a complicated cystic locule.  However inferiorly was complex solid and cystic intracystic mass identified estimated to be 5.7 x 6.5 x 5 cm with multiple septations.  There was blood flow within the mural nodule.   CA 125 tumor marker was elevated at 114 on 04/20/20.  On 05/03/2020 she underwent a robotic assisted total hysterectomy with bilateral salpingo-oophorectomy, omentectomy, radical tumor debulking, and omentectomy.  Intraoperative findings were significant for a grossly normal-appearing 7 cm uterus and grossly normal-appearing left tube and ovary.  The right ovary was replaced by 15 cm cystic and solid mass which was solid at its inferior aspect and densely adherent and infiltrating into the right uterosacral ligament and right parametrial tissues.  The rectum was adherent to the posterior uterus and cervix in the midline.  There was no gross upper abdominal disease.  There was trace ascites that was amber in color.  There was unavoidable cyst ruptured during the procedure due to its infiltrative nature.  Diaphragms were smooth.  No gross abnormalities was seen  in the intestines.  There was no gross residual tumor at the completion of the procedure representing a complete, R0 resection.   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  The carcinoma was confined to the ovary without involvement of the ovarian surface.  The left ovary tube and uterus were all benign.  The omentum was benign as were the peritoneal biopsies for staging.  Peritoneal washings were negative.  Due to the infiltrative nature of the tumor into the pelvic sidewall she was determined to have a stage II cancer, grade 3 clear cell.  Due to the locally infiltrative nature of the tumor and its high-grade she was recommended to have adjuvant chemotherapy for 6 cycles of carboplatin and paclitaxel in accordance with NCCN guidelines.  Germline and somatic testing for HRD and BRCA was negative.    She went on to receive 6 cycles of carboplatin and paclitaxel chemotherapy with Dr Ellin Saba at [facility] between 06/06/20 and 09/19/20. She tolerated therapy well with toxicity of neuropathy persistent.   CA 125 postoperatively and pre-treatment was 46.5 on 08/08/20.   After completion of therapy it was 41 on 09/19/20.   Post-treatment CT abd/pelvis 10/16/2020 revealed no evidence of recurrent or metastatic disease.  There is minimal increased density at the vaginal apex that was felt to likely reflect postoperative changes with no measurable soft tissue in this location.  They recommended attention on follow-up.   CA 125 on 10/16/20 was 40.9. CA 125 on 01/22/21 was 38.8. CA 125 on 04/30/21 was normal at 30.1. CA 125 on 08/05/21 was normal at 32.3. CA 125 on 11/28/21 was normal at 33.0 CA 125 on 02/04/22 was normal at 33.1 CA 125 on 08/18/22 was normal at 29.8 CA 125 on 12/18/22 was slightly increased at 38.5 CA 125 on 03/06/23 was normal at 32.9    CT of the abdomen and pelvis on 08/06/2021: No findings of active malignancy.  Aortic atherosclerosis as well as systemic atherosclerosis noted.  Mild cardiomegaly.  Hiatal hernia.  Prominent stool throughout the colon favors constipation.  Sigmoid colon diverticulosis.  Most recent CT was on 01/05/2023 returning  with 1. No evidence for metastatic disease in the abdomen or pelvis. 2. Diffuse bladder wall thickening may be related to under distention. Correlate clinically for cystitis.  Interval History: She presents today to the office for continued follow-up.  She reports doing well since her last visit.  Tolerating diet with no nausea or emesis. No change in appetite or early satiety. She does report left abdominal upper quadrant discomfort when gas and stool moves through this area.  After having a bowel movement, the pain resolves.  No rectal bleeding reported and having normal bowel movements.  Continues to have bladder incontinence which she has seen urology for.  No vaginal bleeding or discharge reported. She had a recent fall at church. She fell on her left shoulder and right foot.  Reports left clavicle fullness has been present for many years.  Denies chest pain, shortness of breath.  She had a cardioversion recently on 05/24/23 and is doing well after that. No symptoms concerning for recurrence voiced. Last mammogram was on 05/20/23 and was normal.  Past Medical/Surgical History: Past Medical History:  Diagnosis Date   A-fib (HCC)    Anxiety    Arthritis    Asthma    hx of    Cancer (HCC)    ovarian   Dyspnea    with exertion  H/O seasonal allergies    Heart murmur    as aa child    Hypercholesteremia    Hypertension    Spinal stenosis     Past Surgical History:  Procedure Laterality Date   ABDOMINAL HYSTERECTOMY     BIOPSY  05/08/2020   Procedure: BIOPSY;  Surgeon: Dolores Frame, MD;  Location: [facility];  Service: Gastroenterology;;   BREAST SURGERY     CARDIAC CATHETERIZATION     CHOLECYSTECTOMY     COLONOSCOPY N/A 08/14/2015   Procedure: COLONOSCOPY;  Surgeon: Franky Macho, MD;  Location: [facility];  Service: Gastroenterology;  Laterality: N/A;   ESOPHAGOGASTRODUODENOSCOPY (EGD) WITH PROPOFOL N/A 05/08/2020   Procedure: ESOPHAGOGASTRODUODENOSCOPY (EGD)  WITH PROPOFOL;  Surgeon: Dolores Frame, MD;  Location: [facility];  Service: Gastroenterology;  Laterality: N/A;   ESOPHAGOGASTRODUODENOSCOPY (EGD) WITH PROPOFOL N/A 08/07/2020   Procedure: ESOPHAGOGASTRODUODENOSCOPY (EGD) WITH PROPOFOL;  Surgeon: Dolores Frame, MD;  Location: [facility];  Service: Gastroenterology;  Laterality: N/A;  7:30   IR IMAGING GUIDED PORT INSERTION  06/04/2020   ROBOTIC ASSISTED TOTAL HYSTERECTOMY WITH BILATERAL SALPINGO OOPHERECTOMY N/A 05/03/2020   Procedure: XI ROBOTIC ASSISTED TOTAL HYSTERECTOMY WITH BILATERAL SALPINGO OOPHORECTOMY, OMENTECTOMY,RADICAL TUMOR DEBULKING, RIGID PROSTOSCOPY;  Surgeon: Adolphus Birchwood, MD;  Location: [facility];  Service: Gynecology;  Laterality: N/A;   THROMBECTOMY FEMORAL ARTERY Right 12/17/2013   Procedure: REPAIR OF RIGHT FEMORAL ARTERY PSEUDOANEURYSM;  Surgeon: Sherren Kerns, MD;  Location: [facility] OR;  Service: Vascular;  Laterality: Right;    Family History  Problem Relation Age of Onset   Hypertension Mother    Heart failure Mother    Asthma Mother    Ulcers Mother    Benign prostatic hyperplasia Father    Ulcers Father    Esophageal varices Father    Atrial fibrillation Sister    Atrial fibrillation Brother    Heart disease Brother    Melanoma Maternal Aunt    Colon cancer Neg Hx    Breast cancer Neg Hx    Ovarian cancer Neg Hx    Endometrial cancer Neg Hx    Pancreatic cancer Neg Hx    Prostate cancer Neg Hx     Social History   Socioeconomic History   Marital status: Married    Spouse name: Not on file   Number of children: 4   Years of education: college   Highest education level: Not on file  Occupational History   Occupation: retired Engineer, civil (consulting)  Tobacco Use   Smoking status: Never   Smokeless tobacco: Never  Vaping Use   Vaping status: Never Used  Substance and Sexual Activity   Alcohol use: No   Drug use: No   Sexual activity: Not Currently  Other Topics Concern   Not on file   Social History Narrative   Lives at home with husband.   Left-handed.   Caffeine use: 1 cup coffee (half caff), half can of Coke   Social Drivers of Health   Financial Resource Strain: Low Risk  (05/29/2020)   Overall Financial Resource Strain (CARDIA)    Difficulty of Paying Living Expenses: Not hard at all  Food Insecurity: No Food Insecurity (05/29/2020)   Hunger Vital Sign    Worried About Running Out of Food in the Last Year: Never true    Ran Out of Food in the Last Year: Never true  Transportation Needs: No Transportation Needs (05/29/2020)   PRAPARE - Administrator, Civil Service (Medical):  No    Lack of Transportation (Non-Medical): No  Physical Activity: Insufficiently Active (05/29/2020)   Exercise Vital Sign    Days of Exercise per Week: 7 days    Minutes of Exercise per Session: 10 min  Stress: No Stress Concern Present (05/29/2020)   [employer] of Occupational Health - Occupational Stress Questionnaire    Feeling of Stress : Not at all  Social Connections: Moderately Integrated (05/29/2020)   Social Connection and Isolation Panel [NHANES]    Frequency of Communication with Friends and Family: More than three times a week    Frequency of Social Gatherings with Friends and Family: More than three times a week    Attends Religious Services: More than 4 times per year    Active Member of [employer] or Organizations: No    Attends Banker Meetings: Never    Marital Status: Married    Current Medications:  Current Outpatient Medications:    acetaminophen (TYLENOL) 500 MG tablet, Take 1,000 mg by mouth 3 (three) times daily., Disp: , Rfl:    cyclobenzaprine (FLEXERIL) 10 MG tablet, Take 5 mg by mouth See admin instructions. Take 5 mg at night, may take a second 5 mg dose during the day as needed for muscle spasms, Disp: , Rfl:    diltiazem (CARDIZEM) 30 MG tablet, Take 1 tablet every 4 hours AS NEEDED for AFIB heart rate >100, Disp: 30 tablet,  Rfl: 3   fluticasone (FLONASE) 50 MCG/ACT nasal spray, Place 1 spray into both nostrils daily., Disp: , Rfl:    furosemide (LASIX) 20 MG tablet, TAKE 1 TABLET BY MOUTH DAILY AS  NEEDED FOR SWELLING, Disp: 100 tablet, Rfl: 2   albuterol (VENTOLIN HFA) 108 (90 Base) MCG/ACT inhaler, Inhale 2 puffs into the lungs every 4 (four) hours as needed for wheezing or shortness of breath., Disp: 18 g, Rfl: 2   amLODipine (NORVASC) 10 MG tablet, Take 1 tablet (10 mg total) by mouth daily with lunch. For BP, Disp: 30 tablet, Rfl: 3   apixaban (ELIQUIS) 5 MG TABS tablet, Take 1 tablet (5 mg total) by mouth 2 (two) times daily., Disp: , Rfl:    Calcium Carb-Cholecalciferol (CALCIUM 600/VITAMIN D PO), Take 600 mg by mouth in the morning and at bedtime., Disp: , Rfl:    cetirizine (ZYRTEC) 10 MG tablet, Take 10 mg by mouth daily., Disp: , Rfl:    Cholecalciferol (VITAMIN D) 125 MCG (5000 UT) CAPS, Take 5,000 Units by mouth daily., Disp: , Rfl:    Coenzyme Q10 (COQ10) 100 MG CAPS, Take 100 mg by mouth at bedtime. , Disp: , Rfl:    famotidine (PEPCID) 40 MG tablet, Take 40 mg by mouth daily., Disp: , Rfl:    gabapentin (NEURONTIN) 300 MG capsule, Take 300 mg by mouth daily., Disp: , Rfl:    lisinopril (ZESTRIL) 20 MG tablet, Take 20 mg by mouth daily., Disp: , Rfl:    Magnesium 100 MG TABS, Take 50 mg by mouth at bedtime., Disp: , Rfl:    melatonin 5 MG TABS, Take 2.5 mg by mouth at bedtime as needed (Sleep)., Disp: , Rfl:    metoprolol succinate (TOPROL-XL) 100 MG 24 hr tablet, Take 1 tablet (100 mg total) by mouth in the morning., Disp: , Rfl:    Multiple Vitamins-Minerals (CENTRUM SILVER ADULT 50+ PO), Take 1 tablet by mouth daily., Disp: , Rfl:    Omega-3 Fatty Acids (FISH OIL) 1000 MG CAPS, Take 1,000 mg by mouth daily., Disp: ,  Rfl:    pantoprazole (PROTONIX) 40 MG tablet, Take 1 tablet (40 mg total) by mouth daily., Disp: 90 tablet, Rfl: 3   polyethylene glycol powder (GLYCOLAX/MIRALAX) 17 GM/SCOOP powder,  Take 17 g by mouth daily as needed for mild constipation or moderate constipation (With lunch)., Disp: , Rfl:    Polyvinyl Alcohol-Povidone PF (REFRESH) 1.4-0.6 % SOLN, Apply 1 drop to eye as needed., Disp: , Rfl:    simvastatin (ZOCOR) 20 MG tablet, Take 20 mg by mouth at bedtime., Disp: , Rfl:    sodium chloride (OCEAN) 0.65 % SOLN nasal spray, Place 1 spray into both nostrils See admin instructions. Use 1 spray in each nostril in the morning may use a second dose at night as needed for congestion, Disp: , Rfl:    triamcinolone (KENALOG) 0.1 %, Apply 1 application topically daily as needed (irritation)., Disp: , Rfl:    vitamin C (ASCORBIC ACID) 500 MG tablet, Take 1,000 mg by mouth 2 (two) times daily., Disp: , Rfl:    Vitamins A & D (VITAMIN A & D) ointment, Apply 1 application topically daily as needed for dry skin (irritation)., Disp: , Rfl:   Review of Systems: No new symptoms reported on intake ROS form. See interval. Denies appetite changes, fevers, chills, fatigue, unexplained weight changes. Denies hearing loss, neck lumps or masses, mouth sores, ringing in ears or voice changes. Denies cough or wheezing.  Denies shortness of breath. Denies chest pain or palpitations. Denies leg swelling. Denies abdominal distention, pain, blood in stools, constipation, diarrhea, nausea, vomiting, or early satiety. Denies pain with intercourse, dysuria, hematuria. +freq and incont-unchanged for pt. Denies hot flashes, pelvic pain, vaginal bleeding or vaginal discharge.   Denies back pain or muscle pain/cramps. +left shoulder soreness and right foot pain from recent fall Denies itching, rash, or wounds. Denies dizziness, headaches, numbness or seizures. Denies swollen lymph nodes or glands, denies easy bruising or bleeding. Denies anxiety, depression, confusion, or decreased concentration.  Physical Exam: BP (!) 185/62 (BP Location: Left Arm, Patient Position: Sitting) Comment: 188/64, MD notified   Pulse (!) 56   Temp 97.8 F (36.6 C) (Oral)   Resp 18   Ht 4\' 9"  (1.448 m)   Wt 170 lb (77.1 kg)   SpO2 100%   BMI 36.79 kg/m  General: Well developed, well nourished female in no acute distress. Alert and oriented x 3.  Neck: Supple without any enlargements.  Lymph node survey: No cervical, supraclavicular, or inguinal adenopathy. Left supraclavicular fullness, lipoma-fatty tissue like consistency on palpation-unchanged for years per pt. Cardiovascular: Regular rate and rhythm. S1 and S2 normal.  Lungs: Clear to auscultation bilaterally. No wheezes/crackles/rhonchi noted.  Skin: No rashes or lesions present. Back: No CVA tenderness.  Abdomen: Abdomen soft, non-tender and obese. Active bowel sounds in all quadrants. No evidence of a fluid wave or abdominal masses. Well healed incisions without nodularity.  Genitourinary:    Vulva/vagina: Normal external female genitalia. No lesions.    Urethra: No lesions or masses.    Vagina: Atrophic without any lesions. No palpable masses. No vaginal bleeding or drainage noted. Laxity of the anterior vaginal wall present.  Rectal: Good tone, no masses, no cul de sac nodularity.  Extremities: No bilateral cyanosis, edema, or clubbing.    Laboratory & Radiologic Studies: Last CT A/P 12/2022   Assessment & Plan: April Guerra is a 77 y.o. woman with a history of stage II clear cell ovarian cancer s/p robotic assisted total hysterectomy, BSO, omentectomy, staging  on 05/03/20. BRCA/HRD negative. S/p adjuvant carb/tax x 6 completed 09/19/20. Complete clinical response (upper limit normal but stable CA125, negative scans, negative exam).   The patient is overall doing very well and is NED on exam today.  CT scan in 12/2022 was negative for any evidence of disease. We will plan to get repeat CA-125 today.   Per NCCN surveillance recommendations, we will continue with visits every 4-6 months. We will have her follow up with Dr. Ellin Saba in 4 months and Dr.  Pricilla Holm in 8 months.  We reviewed signs and symptoms that would be concerning for cancer recurrence, and I have urged her to call if she develops any of these between visits.   She is following with urology for her urinary incontinence, urgency.  20 minutes of total time was spent for this patient encounter, including preparation, face-to-face counseling with the patient and coordination of care, and documentation of the encounter.  Warner Mccreedy, NP [facility] G[facility] Oncology

## 2023-06-12 NOTE — Patient Instructions (Addendum)
We will contact you with the results of your CA 125.  Plan to follow up with Dr. Kirtland Bouchard at [facility] and then our office in 8 months or sooner if needed.  Symptoms to report to your health care team include vaginal bleeding, rectal bleeding, bloating, weight loss without effort, new and persistent pain, new and  persistent fatigue, new leg swelling, new masses (i.e., bumps in your neck or groin), new and persistent cough, new and persistent nausea and vomiting, change in bowel or bladder habits, and any other concerns.

## 2023-06-12 NOTE — Patient Instructions (Signed)
Medication Instructions:  Your physician recommends that you continue on your current medications as directed. Please refer to the Current Medication list given to you today.  *If you need a refill on your cardiac medications before your next appointment, please call your pharmacy*   Lab Work: None If you have labs (blood work) drawn today and your tests are completely normal, you will receive your results only by: MyChart Message (if you have MyChart) OR A paper copy in the mail If you have any lab test that is abnormal or we need to change your treatment, we will call you to review the results.   Testing/Procedures: None   Follow-Up: At [facility], you and your health needs are our priority.  As part of our continuing mission to provide you with exceptional heart care, we have created designated Provider Care Teams.  These Care Teams include your primary Cardiologist (physician) and Advanced Practice Providers (APPs -  Physician Assistants and Nurse Practitioners) who all work together to provide you with the care you need, when you need it.  We recommend signing up for the patient portal called "MyChart".  Sign up information is provided on this After Visit Summary.  MyChart is used to connect with patients for Virtual Visits (Telemedicine).  Patients are able to view lab/test results, encounter notes, upcoming appointments, etc.  Non-urgent messages can be sent to your provider as well.   To learn more about what you can do with MyChart, go to [email or web address].    Your next appointment:   6 month(s)  Provider:   You may see Dina Rich, MD or the following Advanced Practice Provider on your designated Care Team:   Sharlene Dory, NP    Other Instructions

## 2023-06-12 NOTE — Progress Notes (Signed)
Clinical Summary April Guerra is a 77 y.o.female seen today for follow up of the following medical problems.    1.PAF - history of prior DCCV 05/01/2018 in setting of newly diagnosed afib, deemed to be of recent onset within hours to presentation -11/02/22 DCCV in ER   -ER visit 05/24/2023 with SOB and palpitations - found to be in afib with elevated HRs - K 3.8, Mg 2.1 TSH 2.1  - had not missed any doses of anticoag, electively cardiovered in ER   - home regimen is toprol 100, diltiazem 30mg  prn, eliquis 5mg  bid - no palpitations since recent DCCV      2. LE edema - some swelling at times, wears compression stockings        3.HTN - frequent urination, would avoid diuretic   -home bp's 130s/70     3. Hyperlipidemia   - she is on simvastatin, reports dose lowered to 20mg  daily about 4 months ago. Symptoms no better.  - history of neuropathy, carpal tunnel   08/2021 TC 188 TG 115 HDL 60 LDL 108     She reports normal cath 2015, complicated by pseudoaneurysm required a surgery     SH: former nurse. Husband Makaelyn Eib is also a patient of mine. Past Medical History:  Diagnosis Date   A-fib (HCC)    Anxiety    Arthritis    Asthma    hx of    Cancer (HCC)    ovarian   Dyspnea    with exertion    H/O seasonal allergies    Heart murmur    as aa child    Hypercholesteremia    Hypertension    Spinal stenosis      Allergies  Allergen Reactions   Morphine And Codeine Nausea And Vomiting   Zofran [Ondansetron]     Causes minor constipation, tolerates low doses       Current Outpatient Medications  Medication Sig Dispense Refill   acetaminophen (TYLENOL) 500 MG tablet Take 1,000 mg by mouth 3 (three) times daily.     albuterol (VENTOLIN HFA) 108 (90 Base) MCG/ACT inhaler Inhale 2 puffs into the lungs every 4 (four) hours as needed for wheezing or shortness of breath. 18 g 2   amLODipine (NORVASC) 10 MG tablet Take 1 tablet (10 mg total) by mouth daily with  lunch. For BP 30 tablet 3   apixaban (ELIQUIS) 5 MG TABS tablet Take 1 tablet (5 mg total) by mouth 2 (two) times daily.     Calcium Carb-Cholecalciferol (CALCIUM 600/VITAMIN D PO) Take 600 mg by mouth in the morning and at bedtime.     cetirizine (ZYRTEC) 10 MG tablet Take 10 mg by mouth daily.     Cholecalciferol (VITAMIN D) 125 MCG (5000 UT) CAPS Take 5,000 Units by mouth daily.     Coenzyme Q10 (COQ10) 100 MG CAPS Take 100 mg by mouth at bedtime.      cyclobenzaprine (FLEXERIL) 10 MG tablet Take 5 mg by mouth See admin instructions. Take 5 mg at night, may take a second 5 mg dose during the day as needed for muscle spasms     famotidine (PEPCID) 40 MG tablet Take 40 mg by mouth daily.     fluticasone (FLONASE) 50 MCG/ACT nasal spray Place 1 spray into both nostrils daily.     furosemide (LASIX) 20 MG tablet TAKE 1 TABLET BY MOUTH DAILY AS  NEEDED FOR SWELLING 100 tablet 2   gabapentin (NEURONTIN)  300 MG capsule Take 300 mg by mouth daily.     lisinopril (ZESTRIL) 20 MG tablet Take 20 mg by mouth daily.     Magnesium 100 MG TABS Take 50 mg by mouth at bedtime.     melatonin 5 MG TABS Take 2.5 mg by mouth at bedtime as needed (Sleep).     metoprolol succinate (TOPROL-XL) 100 MG 24 hr tablet Take 1 tablet (100 mg total) by mouth in the morning.     Multiple Vitamins-Minerals (CENTRUM SILVER ADULT 50+ PO) Take 1 tablet by mouth daily.     Omega-3 Fatty Acids (FISH OIL) 1000 MG CAPS Take 1,000 mg by mouth daily.     pantoprazole (PROTONIX) 40 MG tablet Take 1 tablet (40 mg total) by mouth daily. 90 tablet 3   polyethylene glycol powder (GLYCOLAX/MIRALAX) 17 GM/SCOOP powder Take 17 g by mouth daily as needed for mild constipation or moderate constipation (With lunch).     Polyvinyl Alcohol-Povidone PF (REFRESH) 1.4-0.6 % SOLN Apply 1 drop to eye as needed.     simvastatin (ZOCOR) 20 MG tablet Take 20 mg by mouth at bedtime.     sodium chloride (OCEAN) 0.65 % SOLN nasal spray Place 1 spray into  both nostrils See admin instructions. Use 1 spray in each nostril in the morning may use a second dose at night as needed for congestion     triamcinolone (KENALOG) 0.1 % Apply 1 application topically daily as needed (irritation).     vitamin C (ASCORBIC ACID) 500 MG tablet Take 1,000 mg by mouth 2 (two) times daily.     Vitamins A & D (VITAMIN A & D) ointment Apply 1 application topically daily as needed for dry skin (irritation).     diltiazem (CARDIZEM) 30 MG tablet Take 1 tablet every 4 hours AS NEEDED for AFIB heart rate >100 30 tablet 3   No current facility-administered medications for this visit.     Past Surgical History:  Procedure Laterality Date   ABDOMINAL HYSTERECTOMY     BIOPSY  05/08/2020   Procedure: BIOPSY;  Surgeon: Dolores Frame, MD;  Location: [facility];  Service: Gastroenterology;;   BREAST SURGERY     CARDIAC CATHETERIZATION     CHOLECYSTECTOMY     COLONOSCOPY N/A 08/14/2015   Procedure: COLONOSCOPY;  Surgeon: Franky Macho, MD;  Location: [facility];  Service: Gastroenterology;  Laterality: N/A;   ESOPHAGOGASTRODUODENOSCOPY (EGD) WITH PROPOFOL N/A 05/08/2020   Procedure: ESOPHAGOGASTRODUODENOSCOPY (EGD) WITH PROPOFOL;  Surgeon: Dolores Frame, MD;  Location: [facility];  Service: Gastroenterology;  Laterality: N/A;   ESOPHAGOGASTRODUODENOSCOPY (EGD) WITH PROPOFOL N/A 08/07/2020   Procedure: ESOPHAGOGASTRODUODENOSCOPY (EGD) WITH PROPOFOL;  Surgeon: Dolores Frame, MD;  Location: [facility];  Service: Gastroenterology;  Laterality: N/A;  7:30   IR IMAGING GUIDED PORT INSERTION  06/04/2020   ROBOTIC ASSISTED TOTAL HYSTERECTOMY WITH BILATERAL SALPINGO OOPHERECTOMY N/A 05/03/2020   Procedure: XI ROBOTIC ASSISTED TOTAL HYSTERECTOMY WITH BILATERAL SALPINGO OOPHORECTOMY, OMENTECTOMY,RADICAL TUMOR DEBULKING, RIGID PROSTOSCOPY;  Surgeon: Adolphus Birchwood, MD;  Location: [facility];  Service: Gynecology;  Laterality: N/A;   THROMBECTOMY  FEMORAL ARTERY Right 12/17/2013   Procedure: REPAIR OF RIGHT FEMORAL ARTERY PSEUDOANEURYSM;  Surgeon: Sherren Kerns, MD;  Location: [facility];  Service: Vascular;  Laterality: Right;     Allergies  Allergen Reactions   Morphine And Codeine Nausea And Vomiting   Zofran [Ondansetron]     Causes minor constipation, tolerates low doses  Family History  Problem Relation Age of Onset   Hypertension Mother    Heart failure Mother    Asthma Mother    Ulcers Mother    Benign prostatic hyperplasia Father    Ulcers Father    Esophageal varices Father    Atrial fibrillation Sister    Atrial fibrillation Brother    Heart disease Brother    Melanoma Maternal Aunt    Colon cancer Neg Hx    Breast cancer Neg Hx    Ovarian cancer Neg Hx    Endometrial cancer Neg Hx    Pancreatic cancer Neg Hx    Prostate cancer Neg Hx      Social History Ms. Radel reports that she has never smoked. She has never used smokeless tobacco. Ms. Bosman reports no history of alcohol use.   Review of Systems CONSTITUTIONAL: No weight loss, fever, chills, weakness or fatigue.  HEENT: Eyes: No visual loss, blurred vision, double vision or yellow sclerae.No hearing loss, sneezing, congestion, runny nose or sore throat.  SKIN: No rash or itching.  CARDIOVASCULAR: per hpi RESPIRATORY: No shortness of breath, cough or sputum.  GASTROINTESTINAL: No anorexia, nausea, vomiting or diarrhea. No abdominal pain or blood.  GENITOURINARY: No burning on urination, no polyuria NEUROLOGICAL: No headache, dizziness, syncope, paralysis, ataxia, numbness or tingling in the extremities. No change in bowel or bladder control.  MUSCULOSKELETAL: No muscle, back pain, joint pain or stiffness.  LYMPHATICS: No enlarged nodes. No history of splenectomy.  PSYCHIATRIC: No history of depression or anxiety.  ENDOCRINOLOGIC: No reports of sweating, cold or heat intolerance. No polyuria or polydipsia.  Marland Kitchen   Physical  Examination Today's Vitals   06/12/23 1445 06/12/23 1530  BP: (!) 144/82 (!) 144/74  Pulse: 69   SpO2: 97%   Weight: 172 lb 3.2 oz (78.1 kg)   Height: 4\' 9"  (1.448 m)    Body mass index is 37.26 kg/m.  Gen: resting comfortably, no acute distress HEENT: no scleral icterus, pupils equal round and reactive, no palptable cervical adenopathy,  CV: RRR, no mrg, no jvd Resp: Clear to auscultation bilaterally GI: abdomen is soft, non-tender, non-distended, normal bowel sounds, no hepatosplenomegaly MSK: extremities are warm, no edema.  Skin: warm, no rash Neuro:  no focal deficits Psych: appropriate affect   Diagnostic Studies  04/2018 echo   Study Conclusions   - Left ventricle: The cavity size was normal. Wall thickness was    increased in a pattern of moderate LVH. Systolic function was    normal. The estimated ejection fraction was in the range of 60%    to 65%. Wall motion was normal; there were no regional wall    motion abnormalities. Left ventricular diastolic function    parameters were normal.  - Mitral valve: Mildly thickened leaflets . There was mild    regurgitation.  - Left atrium: The atrium was mildly dilated.  - Right atrium: The atrium was at the upper limits of normal in    size.  - Inferior vena cava: The vessel was normal in size. The    respirophasic diameter changes were in the normal range (>= 50%),    consistent with normal central venous pressure.    Assessment and Plan   1.AFib/acquired thrombophilia - DCCV 05/24/23, prior to that had 10/2022 and had maintained SR - monitor for now, tends not to tolerate afib well. If recurrence in short time frame would likely need to consider antiarrhythmic - continue eliquis for stroke prevention - EKG  today shows NSR   2. HTN - elevated here but home numbers at goal, continue current meds     Antoine Poche, M.D.

## 2023-06-13 LAB — CA 125: Cancer Antigen (CA) 125: 34.4 U/mL (ref 0.0–38.1)

## 2023-06-15 ENCOUNTER — Telehealth: Payer: Self-pay | Admitting: *Deleted

## 2023-06-15 NOTE — Telephone Encounter (Signed)
-----   Message from Doylene Bode sent at 06/15/2023  9:36 AM EST ----- Please let the patient know her CA 125 level is 34.4 which is stable compared to previous values and within normal range.

## 2023-06-15 NOTE — Telephone Encounter (Signed)
Spoke with [name] and relayed message from April Mccreedy, NP that patient's CA 125 level is 34.4 which is stable compared to previous values and within normal range. Pt very pleased with results, thanked the office for calling and Wished [place] a  [name] Christmas!

## 2023-07-07 ENCOUNTER — Telehealth: Payer: Self-pay | Admitting: Cardiology

## 2023-07-07 MED ORDER — APIXABAN 5 MG PO TABS: 5.0000 mg | ORAL_TABLET | Freq: Two times a day (BID) | ORAL | Status: DC

## 2023-07-07 NOTE — Telephone Encounter (Signed)
 Pt needs New eliquis assistance form  Is okay to leave detailed message on [phone]

## 2023-07-07 NOTE — Telephone Encounter (Signed)
 Samples & application ready for pick up.

## 2023-07-13 ENCOUNTER — Other Ambulatory Visit: Payer: Self-pay | Admitting: *Deleted

## 2023-07-13 MED ORDER — APIXABAN 5 MG PO TABS: 5.0000 mg | ORAL_TABLET | Freq: Two times a day (BID) | ORAL | 3 refills | Status: DC

## 2023-08-03 ENCOUNTER — Telehealth: Payer: Self-pay | Admitting: Cardiology

## 2023-08-03 NOTE — Telephone Encounter (Signed)
 April Guerra came in and stated that April Guerra did not receive the 1099 forms and page 4 did not have his date of birth either. PT  is almost out. Please call April Guerra when everything is complete so she can reach out to [employer].  Fax [phone]

## 2023-08-04 NOTE — Telephone Encounter (Signed)
Noted - faxed this morning.

## 2023-09-04 ENCOUNTER — Inpatient Hospital Stay: Payer: Medicare Other | Attending: Gynecologic Oncology

## 2023-09-04 VITALS — BP 158/74 | HR 74 | Temp 98.2°F | Resp 18

## 2023-09-04 DIAGNOSIS — Z885 Allergy status to narcotic agent status: Secondary | ICD-10-CM | POA: Diagnosis not present

## 2023-09-04 DIAGNOSIS — K5900 Constipation, unspecified: Secondary | ICD-10-CM | POA: Diagnosis not present

## 2023-09-04 DIAGNOSIS — Z7901 Long term (current) use of anticoagulants: Secondary | ICD-10-CM | POA: Insufficient documentation

## 2023-09-04 DIAGNOSIS — I10 Essential (primary) hypertension: Secondary | ICD-10-CM | POA: Insufficient documentation

## 2023-09-04 DIAGNOSIS — Z888 Allergy status to other drugs, medicaments and biological substances status: Secondary | ICD-10-CM | POA: Diagnosis not present

## 2023-09-04 DIAGNOSIS — G629 Polyneuropathy, unspecified: Secondary | ICD-10-CM | POA: Diagnosis not present

## 2023-09-04 DIAGNOSIS — C561 Malignant neoplasm of right ovary: Secondary | ICD-10-CM | POA: Insufficient documentation

## 2023-09-04 DIAGNOSIS — Z79899 Other long term (current) drug therapy: Secondary | ICD-10-CM | POA: Diagnosis not present

## 2023-09-04 DIAGNOSIS — Z9049 Acquired absence of other specified parts of digestive tract: Secondary | ICD-10-CM | POA: Insufficient documentation

## 2023-09-04 DIAGNOSIS — M549 Dorsalgia, unspecified: Secondary | ICD-10-CM | POA: Insufficient documentation

## 2023-09-04 DIAGNOSIS — I4891 Unspecified atrial fibrillation: Secondary | ICD-10-CM | POA: Diagnosis not present

## 2023-09-04 DIAGNOSIS — Z95828 Presence of other vascular implants and grafts: Secondary | ICD-10-CM

## 2023-09-04 DIAGNOSIS — Z8249 Family history of ischemic heart disease and other diseases of the circulatory system: Secondary | ICD-10-CM | POA: Diagnosis not present

## 2023-09-04 DIAGNOSIS — Z825 Family history of asthma and other chronic lower respiratory diseases: Secondary | ICD-10-CM | POA: Insufficient documentation

## 2023-09-04 DIAGNOSIS — Z8379 Family history of other diseases of the digestive system: Secondary | ICD-10-CM | POA: Insufficient documentation

## 2023-09-04 DIAGNOSIS — Z90710 Acquired absence of both cervix and uterus: Secondary | ICD-10-CM | POA: Insufficient documentation

## 2023-09-04 DIAGNOSIS — Z90722 Acquired absence of ovaries, bilateral: Secondary | ICD-10-CM | POA: Insufficient documentation

## 2023-09-04 LAB — COMPREHENSIVE METABOLIC PANEL
ALT: 19 U/L (ref 0–44)
AST: 24 U/L (ref 15–41)
Albumin: 4.3 g/dL (ref 3.5–5.0)
Alkaline Phosphatase: 55 U/L (ref 38–126)
Anion gap: 13 (ref 5–15)
BUN: 16 mg/dL (ref 8–23)
CO2: 21 mmol/L — ABNORMAL LOW (ref 22–32)
Calcium: 9.5 mg/dL (ref 8.9–10.3)
Chloride: 100 mmol/L (ref 98–111)
Creatinine, Ser: 0.6 mg/dL (ref 0.44–1.00)
GFR, Estimated: >60 mL/min (ref >60)
Glucose, Bld: 112 mg/dL — ABNORMAL HIGH (ref 70–99)
Potassium: 3.9 mmol/L (ref 3.5–5.1)
Sodium: 134 mmol/L — ABNORMAL LOW (ref 135–145)
Total Bilirubin: 0.5 mg/dL (ref 0.0–1.2)
Total Protein: 7.6 g/dL (ref 6.5–8.1)

## 2023-09-04 LAB — CBC WITH DIFFERENTIAL/PLATELET
Abs Immature Granulocytes: 0.01 10*3/uL (ref 0.00–0.07)
Basophils Absolute: 0 10*3/uL (ref 0.0–0.1)
Basophils Relative: 1 %
Eosinophils Absolute: 0.1 10*3/uL (ref 0.0–0.5)
Eosinophils Relative: 1 %
HCT: 38 % (ref 36.0–46.0)
Hemoglobin: 12.8 g/dL (ref 12.0–15.0)
Immature Granulocytes: 0 %
Lymphocytes Relative: 25 %
Lymphs Abs: 1.6 10*3/uL (ref 0.7–4.0)
MCH: 32.9 pg (ref 26.0–34.0)
MCHC: 33.7 g/dL (ref 30.0–36.0)
MCV: 97.7 fL (ref 80.0–100.0)
Monocytes Absolute: 0.7 10*3/uL (ref 0.1–1.0)
Monocytes Relative: 10 %
Neutro Abs: 4.1 10*3/uL (ref 1.7–7.7)
Neutrophils Relative %: 63 %
Platelets: 323 10*3/uL (ref 150–400)
RBC: 3.89 MIL/uL (ref 3.87–5.11)
RDW: 12.5 % (ref 11.5–15.5)
WBC: 6.5 10*3/uL (ref 4.0–10.5)
nRBC: 0 % (ref 0.0–0.2)

## 2023-09-04 MED ORDER — HEPARIN SOD (PORK) LOCK FLUSH 100 UNIT/ML IV SOLN
500.0000 [IU] | Freq: Once | INTRAVENOUS | Status: AC
  Administered 2023-09-04: 500 [IU] via INTRAVENOUS

## 2023-09-04 MED ORDER — SODIUM CHLORIDE 0.9% FLUSH
10.0000 mL | Freq: Once | INTRAVENOUS | Status: AC
  Administered 2023-09-04: 10 mL via INTRAVENOUS

## 2023-09-04 NOTE — Progress Notes (Signed)
 Patients port flushed without difficulty.  Good blood return noted with no bruising or swelling noted at site.  Band aid applied.  Lab drawn per orders. VSS with discharge and left in satisfactory condition with no s/s of distress noted. All follow ups as scheduled.       Estiven Kohan [employer]

## 2023-09-05 LAB — CA 125: Cancer Antigen (CA) 125: 29.2 U/mL (ref 0.0–38.1)

## 2023-09-10 DIAGNOSIS — E7849 Other hyperlipidemia: Secondary | ICD-10-CM | POA: Diagnosis not present

## 2023-09-10 DIAGNOSIS — R062 Wheezing: Secondary | ICD-10-CM | POA: Diagnosis not present

## 2023-09-10 DIAGNOSIS — D500 Iron deficiency anemia secondary to blood loss (chronic): Secondary | ICD-10-CM | POA: Diagnosis not present

## 2023-09-10 DIAGNOSIS — R051 Acute cough: Secondary | ICD-10-CM | POA: Diagnosis not present

## 2023-09-10 DIAGNOSIS — K2100 Gastro-esophageal reflux disease with esophagitis, without bleeding: Secondary | ICD-10-CM | POA: Diagnosis not present

## 2023-09-10 DIAGNOSIS — R5382 Chronic fatigue, unspecified: Secondary | ICD-10-CM | POA: Diagnosis not present

## 2023-09-10 DIAGNOSIS — I10 Essential (primary) hypertension: Secondary | ICD-10-CM | POA: Diagnosis not present

## 2023-09-11 ENCOUNTER — Inpatient Hospital Stay: Payer: Medicare Other | Admitting: Oncology

## 2023-09-11 VITALS — BP 175/89 | HR 91 | Temp 98.1°F | Resp 18 | Wt 173.1 lb

## 2023-09-11 DIAGNOSIS — I4891 Unspecified atrial fibrillation: Secondary | ICD-10-CM | POA: Diagnosis not present

## 2023-09-11 DIAGNOSIS — Z8249 Family history of ischemic heart disease and other diseases of the circulatory system: Secondary | ICD-10-CM | POA: Diagnosis not present

## 2023-09-11 DIAGNOSIS — I10 Essential (primary) hypertension: Secondary | ICD-10-CM | POA: Diagnosis not present

## 2023-09-11 DIAGNOSIS — Z79899 Other long term (current) drug therapy: Secondary | ICD-10-CM | POA: Diagnosis not present

## 2023-09-11 DIAGNOSIS — Z8379 Family history of other diseases of the digestive system: Secondary | ICD-10-CM | POA: Diagnosis not present

## 2023-09-11 DIAGNOSIS — Z888 Allergy status to other drugs, medicaments and biological substances status: Secondary | ICD-10-CM | POA: Diagnosis not present

## 2023-09-11 DIAGNOSIS — Z825 Family history of asthma and other chronic lower respiratory diseases: Secondary | ICD-10-CM | POA: Diagnosis not present

## 2023-09-11 DIAGNOSIS — C561 Malignant neoplasm of right ovary: Secondary | ICD-10-CM | POA: Diagnosis not present

## 2023-09-11 DIAGNOSIS — M549 Dorsalgia, unspecified: Secondary | ICD-10-CM | POA: Diagnosis not present

## 2023-09-11 DIAGNOSIS — Z95828 Presence of other vascular implants and grafts: Secondary | ICD-10-CM | POA: Diagnosis not present

## 2023-09-11 DIAGNOSIS — Z9049 Acquired absence of other specified parts of digestive tract: Secondary | ICD-10-CM | POA: Diagnosis not present

## 2023-09-11 DIAGNOSIS — Z885 Allergy status to narcotic agent status: Secondary | ICD-10-CM | POA: Diagnosis not present

## 2023-09-11 DIAGNOSIS — G629 Polyneuropathy, unspecified: Secondary | ICD-10-CM | POA: Diagnosis not present

## 2023-09-11 DIAGNOSIS — Z7901 Long term (current) use of anticoagulants: Secondary | ICD-10-CM | POA: Diagnosis not present

## 2023-09-11 DIAGNOSIS — K5900 Constipation, unspecified: Secondary | ICD-10-CM | POA: Diagnosis not present

## 2023-09-11 NOTE — Progress Notes (Signed)
 [facility] [address], [place] [address]    Clinic Day:  09/11/2023  Referring physician: Estanislado Pandy, MD  Patient Care Team: Estanislado Pandy, MD as PCP - General (Family Medicine) Wyline Mood Dorothe Pea, MD as PCP - Cardiology (Cardiology) Therese Sarah, RN as Oncology Nurse Navigator (Oncology)   ASSESSMENT & PLAN:   Assessment: 1.  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 -[id number] on 04/20/2020 -Evaluated by Dr. Andrey Farmer on 05/24/2020. -6 cycles of carboplatin and paclitaxel was recommended. -Somatic and germline mutation testing was negative. -6 cycles of carboplatin and paclitaxel from 06/06/2020 through 09/19/2020.   2.  Atrial fibrillation: - Followed by Dr. Wyline Mood. -Had A-fib with RVR in May 2024.  Required DCCV. -During most recent visit in July 2024 she was in normal sinus rhythm.    Plan: 1.  Stage II clear-cell right ovarian cancer: - She denies any abdominal pain or bloating. - CTAP on 01/05/2023 did not reveal any evidence of recurrence. -Labs from 09/04/2023 show an unremarkable CBC.  Cancer antigen 125 is 29.2 which is within normal limits.  LFTs are normal. - She takes MiraLAX 2-3 times per week for constipation. - Recommend follow-up in 6 months with labs including tumor marker (cbc/d, cmp, ca125).  Had CT scan completed on 01/05/2023 with Dr. Pricilla Holm which did not reveal evidence of recurrent malignancy. -Recommend CT scan if tumor markers trend up.   2.  Peripheral neuropathy: - She takes gabapentin 300 mg at bedtime as needed up to 3 times per week.   3.  Genetic testing: - Somatic and germline mutation testing was negative.  4. Back pain  -She completed physical therapy for back pain this summer.  Feels like it really did help her strength and mobility.   -Takes gabapentin and magnesium at bedtime which seems to help. -She will  occasionally take Flexeril and Tylenol for back pain, right arm pain.   No orders of the defined types were placed in this encounter.  PLAN SUMMARY: >> Return to clinic in 6 months with labs a few days before.     I spent 20 minutes dedicated to the care of this patient (face-to-face and non-face-to-face) on the date of the encounter to include what is described in the assessment and plan.    Mauro Kaufmann, NP   3/21/202511:14 AM  CHIEF COMPLAINT:   Diagnosis: right ovarian cancer    Cancer Staging  Right ovarian epithelial cancer [facility]) Staging form: Ovary, Fallopian Tube, and Primary Peritoneal Carcinoma, AJCC 8th Edition - Clinical stage from 05/03/2020: FIGO Stage II, calculated as Stage Unknown (cT2b, cNX, cM0) - Signed by Adolphus Birchwood, MD on 05/24/2020    Prior Therapy:  1. TAH-BSO on 05/03/2020. 2. Carboplatin, paclitaxel & Aloxi x 6 cycles from 06/06/2020 to 09/19/2020.  Current Therapy:  surveillance    HISTORY OF PRESENT ILLNESS:   Oncology History  Right ovarian epithelial cancer (HCC)  05/03/2020 Initial Diagnosis   Right ovarian epithelial cancer (HCC)   05/03/2020 Cancer Staging   Staging form: Ovary, Fallopian Tube, and Primary Peritoneal Carcinoma, AJCC 8th Edition - Clinical stage from 05/03/2020: FIGO Stage II, calculated as Stage Unknown (cT2b, cNX, cM0) - Signed by Adolphus Birchwood, MD on 05/24/2020   06/06/2020 - 09/21/2020 Chemotherapy   Patient is on Treatment Plan : OVARIAN Carboplatin (AUC 6) / Paclitaxel (175) q21d x 6 cycles  Genetic Testing   Negative genetic testing. No pathogenic variants identified on the [employer]. The report date is 08/10/2020.  The CancerNext gene panel offered by [employer] includes sequencing and rearrangement analysis for the following 36 genes: APC*, ATM*, AXIN2, BARD1, BMPR1A, BRCA1*, BRCA2*, BRIP1*, CDH1*, CDK4, CDKN2A, CHEK2*, DICER1, MLH1*, MSH2*, MSH3, MSH6*, MUTYH*, NBN, NF1*,  NTHL1, PALB2*, PMS2*, PTEN*, RAD51C*, RAD51D*, RECQL, SMAD4, SMARCA4, STK11 and TP53* (sequencing and deletion/duplication); HOXB13, POLD1 and POLE (sequencing only); EPCAM and GREM1 (deletion/duplication only).   Somatic genes analyzed through TumorNext-HRD: ATM, BARD1, BRCA1, BRCA2, BRIP1, CHEK2, MRE11A, NBN, PALB2, RAD51C, RAD51D.      INTERVAL HISTORY:   Arlinda is a 78 y.o. female presenting to clinic today for follow up of right ovarian cancer. She was last seen by me on 03/13/2023.  In the interim, she was seen at [facility], ED (05/24/23) for atrial fibrillation and SOB.  Required DCCV 05/24/23.  She is followed by Dr. Wyline Mood.  She is on Eliquis.  No atrial fibrillation since.  She had a CT abdomen/pelvis on 01/05/2023 but did not reveal any evidence of metastatic disease in the abdomen or pelvis.  Diffuse bladder wall thickening may be related to underdistention correlate clinically for cystitis.  Reports upper respiratory infection since Tuesday. [facility] who assessed her and gave her a steroid injections and instructed to use inhaler. No fevers.  Feeling much better today.  Overall, she is doing well.  Reports an appetite and energy level of 100%.  Denies any pain.  Has numbness and burning in her right arm occasionally.  Denies any abdominal pain or bloating.  PAST MEDICAL HISTORY:   Past Medical History: Past Medical History:  Diagnosis Date   A-fib (HCC)    Anxiety    Arthritis    Asthma    hx of    Cancer (HCC)    ovarian   Dyspnea    with exertion    H/O seasonal allergies    Heart murmur    as aa child    Hypercholesteremia    Hypertension    Spinal stenosis     Surgical History: Past Surgical History:  Procedure Laterality Date   ABDOMINAL HYSTERECTOMY     BIOPSY  05/08/2020   Procedure: BIOPSY;  Surgeon: Dolores Frame, MD;  Location: [facility];  Service: Gastroenterology;;   BREAST SURGERY     CARDIAC CATHETERIZATION      CHOLECYSTECTOMY     COLONOSCOPY N/A 08/14/2015   Procedure: COLONOSCOPY;  Surgeon: Franky Macho, MD;  Location: [facility];  Service: Gastroenterology;  Laterality: N/A;   ESOPHAGOGASTRODUODENOSCOPY (EGD) WITH PROPOFOL N/A 05/08/2020   Procedure: ESOPHAGOGASTRODUODENOSCOPY (EGD) WITH PROPOFOL;  Surgeon: Dolores Frame, MD;  Location: [facility];  Service: Gastroenterology;  Laterality: N/A;   ESOPHAGOGASTRODUODENOSCOPY (EGD) WITH PROPOFOL N/A 08/07/2020   Procedure: ESOPHAGOGASTRODUODENOSCOPY (EGD) WITH PROPOFOL;  Surgeon: Dolores Frame, MD;  Location: [facility];  Service: Gastroenterology;  Laterality: N/A;  7:30   IR IMAGING GUIDED PORT INSERTION  06/04/2020   ROBOTIC ASSISTED TOTAL HYSTERECTOMY WITH BILATERAL SALPINGO OOPHERECTOMY N/A 05/03/2020   Procedure: XI ROBOTIC ASSISTED TOTAL HYSTERECTOMY WITH BILATERAL SALPINGO OOPHORECTOMY, OMENTECTOMY,RADICAL TUMOR DEBULKING, RIGID PROSTOSCOPY;  Surgeon: Adolphus Birchwood, MD;  Location: [facility];  Service: Gynecology;  Laterality: N/A;   THROMBECTOMY FEMORAL ARTERY Right 12/17/2013   Procedure: REPAIR OF RIGHT FEMORAL ARTERY PSEUDOANEURYSM;  Surgeon: Sherren Kerns, MD;  Location: [facility] OR;  Service: Vascular;  Laterality: Right;  Social History: Social History   Socioeconomic History   Marital status: Married    Spouse name: Not on file   Number of children: 4   Years of education: college   Highest education level: Not on file  Occupational History   Occupation: retired Engineer, civil (consulting)  Tobacco Use   Smoking status: Never   Smokeless tobacco: Never  Vaping Use   Vaping status: Never Used  Substance and Sexual Activity   Alcohol use: No   Drug use: No   Sexual activity: Not Currently  Other Topics Concern   Not on file  Social History Narrative   Lives at home with husband.   Left-handed.   Caffeine use: 1 cup coffee (half caff), half can of Coke   Social Drivers of Health   Financial Resource Strain: Low Risk   (05/29/2020)   Overall Financial Resource Strain (CARDIA)    Difficulty of Paying Living Expenses: Not hard at all  Food Insecurity: No Food Insecurity (05/29/2020)   Hunger Vital Sign    Worried About Running Out of Food in the Last Year: Never true    Ran Out of Food in the Last Year: Never true  Transportation Needs: No Transportation Needs (05/29/2020)   PRAPARE - Administrator, Civil Service (Medical): No    Lack of Transportation (Non-Medical): No  Physical Activity: Insufficiently Active (05/29/2020)   Exercise Vital Sign    Days of Exercise per Week: 7 days    Minutes of Exercise per Session: 10 min  Stress: No Stress Concern Present (05/29/2020)   [employer] of Occupational Health - Occupational Stress Questionnaire    Feeling of Stress : Not at all  Social Connections: Moderately Integrated (05/29/2020)   Social Connection and Isolation Panel [NHANES]    Frequency of Communication with Friends and Family: More than three times a week    Frequency of Social Gatherings with Friends and Family: More than three times a week    Attends Religious Services: More than 4 times per year    Active Member of [employer] or Organizations: No    Attends Banker Meetings: Never    Marital Status: Married  Catering manager Violence: Not At Risk (05/29/2020)   Humiliation, Afraid, Rape, and Kick questionnaire    Fear of Current or Ex-Partner: No    Emotionally Abused: No    Physically Abused: No    Sexually Abused: No    Family History: Family History  Problem Relation Age of Onset   Hypertension Mother    Heart failure Mother    Asthma Mother    Ulcers Mother    Benign prostatic hyperplasia Father    Ulcers Father    Esophageal varices Father    Atrial fibrillation Sister    Atrial fibrillation Brother    Heart disease Brother    Melanoma Maternal Aunt    Colon cancer Neg Hx    Breast cancer Neg Hx    Ovarian cancer Neg Hx    Endometrial cancer Neg Hx     Pancreatic cancer Neg Hx    Prostate cancer Neg Hx     Current Medications:  Current Outpatient Medications:    acetaminophen (TYLENOL) 500 MG tablet, Take 1,000 mg by mouth 3 (three) times daily., Disp: , Rfl:    albuterol (VENTOLIN HFA) 108 (90 Base) MCG/ACT inhaler, Inhale 2 puffs into the lungs every 4 (four) hours as needed for wheezing or shortness of breath., Disp: 18 g, Rfl: 2  amLODipine (NORVASC) 10 MG tablet, Take 1 tablet (10 mg total) by mouth daily with lunch. For BP, Disp: 30 tablet, Rfl: 3   apixaban (ELIQUIS) 5 MG TABS tablet, Take 1 tablet (5 mg total) by mouth 2 (two) times daily., Disp: 180 tablet, Rfl: 3   Calcium Carb-Cholecalciferol (CALCIUM 600/VITAMIN D PO), Take 600 mg by mouth in the morning and at bedtime., Disp: , Rfl:    cetirizine (ZYRTEC) 10 MG tablet, Take 10 mg by mouth daily., Disp: , Rfl:    Cholecalciferol (VITAMIN D) 125 MCG (5000 UT) CAPS, Take 5,000 Units by mouth daily., Disp: , Rfl:    Coenzyme Q10 (COQ10) 100 MG CAPS, Take 100 mg by mouth at bedtime. , Disp: , Rfl:    cyclobenzaprine (FLEXERIL) 10 MG tablet, Take 5 mg by mouth See admin instructions. Take 5 mg at night, may take a second 5 mg dose during the day as needed for muscle spasms, Disp: , Rfl:    diltiazem (CARDIZEM) 30 MG tablet, Take 1 tablet every 4 hours AS NEEDED for AFIB heart rate >100, Disp: 30 tablet, Rfl: 3   famotidine (PEPCID) 40 MG tablet, Take 40 mg by mouth daily., Disp: , Rfl:    fluticasone (FLONASE) 50 MCG/ACT nasal spray, Place 1 spray into both nostrils daily., Disp: , Rfl:    furosemide (LASIX) 20 MG tablet, TAKE 1 TABLET BY MOUTH DAILY AS  NEEDED FOR SWELLING, Disp: 100 tablet, Rfl: 2   gabapentin (NEURONTIN) 300 MG capsule, Take 300 mg by mouth daily., Disp: , Rfl:    lisinopril (ZESTRIL) 20 MG tablet, Take 20 mg by mouth daily., Disp: , Rfl:    Magnesium 100 MG TABS, Take 50 mg by mouth at bedtime., Disp: , Rfl:    melatonin 5 MG TABS, Take 2.5 mg by mouth at  bedtime as needed (Sleep)., Disp: , Rfl:    metoprolol succinate (TOPROL-XL) 100 MG 24 hr tablet, Take 1 tablet (100 mg total) by mouth in the morning., Disp: , Rfl:    Multiple Vitamins-Minerals (CENTRUM SILVER ADULT 50+ PO), Take 1 tablet by mouth daily., Disp: , Rfl:    Omega-3 Fatty Acids (FISH OIL) 1000 MG CAPS, Take 1,000 mg by mouth daily., Disp: , Rfl:    pantoprazole (PROTONIX) 40 MG tablet, Take 1 tablet (40 mg total) by mouth daily., Disp: 90 tablet, Rfl: 3   polyethylene glycol powder (GLYCOLAX/MIRALAX) 17 GM/SCOOP powder, Take 17 g by mouth daily as needed for mild constipation or moderate constipation (With lunch)., Disp: , Rfl:    Polyvinyl Alcohol-Povidone PF (REFRESH) 1.4-0.6 % SOLN, Apply 1 drop to eye as needed., Disp: , Rfl:    simvastatin (ZOCOR) 20 MG tablet, Take 20 mg by mouth at bedtime., Disp: , Rfl:    sodium chloride (OCEAN) 0.65 % SOLN nasal spray, Place 1 spray into both nostrils See admin instructions. Use 1 spray in each nostril in the morning may use a second dose at night as needed for congestion, Disp: , Rfl:    triamcinolone (KENALOG) 0.1 %, Apply 1 application topically daily as needed (irritation)., Disp: , Rfl:    vitamin C (ASCORBIC ACID) 500 MG tablet, Take 1,000 mg by mouth 2 (two) times daily., Disp: , Rfl:    Vitamins A & D (VITAMIN A & D) ointment, Apply 1 application topically daily as needed for dry skin (irritation)., Disp: , Rfl:    Allergies: Allergies  Allergen Reactions   Morphine And Codeine Nausea And Vomiting  Zofran [Ondansetron]     Causes minor constipation, tolerates low doses      REVIEW OF SYSTEMS:   Review of Systems  Respiratory:  Positive for cough and shortness of breath.   Neurological:  Positive for numbness.     VITALS:   Weight 173 lb 1 oz (78.5 kg).  Wt Readings from Last 3 Encounters:  09/11/23 173 lb 1 oz (78.5 kg)  06/12/23 172 lb 3.2 oz (78.1 kg)  06/12/23 170 lb (77.1 kg)    Body mass index is 37.45  kg/m.  Performance status (ECOG): 1 - Symptomatic but completely ambulatory  PHYSICAL EXAM:   Physical Exam Vitals reviewed.  HENT:     Right Ear: Tympanic membrane normal.     Left Ear: Tympanic membrane normal.     Nose: Nose normal.     Mouth/Throat:     Mouth: Mucous membranes are moist.     Pharynx: No oropharyngeal exudate.  Cardiovascular:     Rate and Rhythm: Normal rate.  Pulmonary:     Effort: Pulmonary effort is normal.     Breath sounds: Normal breath sounds.  Abdominal:     General: Bowel sounds are normal.     Palpations: Abdomen is soft.  Neurological:     Mental Status: She is alert and oriented to person, place, and time.     LABS:      Latest Ref Rng & Units 09/04/2023   11:33 AM 05/24/2023   12:28 PM 03/06/2023    9:31 AM  CBC  WBC 4.0 - 10.5 K/uL 6.5  8.2  5.5   Hemoglobin 12.0 - 15.0 g/dL 78.2  95.6  21.3   Hematocrit 36.0 - 46.0 % 38.0  39.1  40.0   Platelets 150 - 400 K/uL 323  330  325       Latest Ref Rng & Units 09/04/2023   11:33 AM 05/24/2023   12:28 PM 03/06/2023    9:31 AM  CMP  Glucose 70 - 99 mg/dL 086  98  96   BUN 8 - 23 mg/dL 16  18  12    Creatinine 0.44 - 1.00 mg/dL 5.78  4.69  6.29   Sodium 135 - 145 mmol/L 134  135  132   Potassium 3.5 - 5.1 mmol/L 3.9  3.8  4.3   Chloride 98 - 111 mmol/L 100  101  97   CO2 22 - 32 mmol/L 21  22  24    Calcium 8.9 - 10.3 mg/dL 9.5  9.5  9.5   Total Protein 6.5 - 8.1 g/dL 7.6   7.8   Total Bilirubin 0.0 - 1.2 mg/dL 0.5   0.7   Alkaline Phos 38 - 126 U/L 55   57   AST 15 - 41 U/L 24   28   ALT 0 - 44 U/L 19   25      No results found for: "CEA1", "CEA" / No results found for: "CEA1", "CEA" No results found for: "PSA1" No results found for: "CAN199" Lab Results  Component Value Date   CAN125 29.2 09/04/2023    No results found for: "TOTALPROTELP", "ALBUMINELP", "A1GS", "A2GS", "BETS", "BETA2SER", "GAMS", "MSPIKE", "SPEI" Lab Results  Component Value Date   TIBC 444 05/29/2020    FERRITIN 40 05/29/2020   IRONPCTSAT 17 05/29/2020   No results found for: "LDH"   STUDIES:   No results found.

## 2023-09-15 DIAGNOSIS — E782 Mixed hyperlipidemia: Secondary | ICD-10-CM | POA: Diagnosis not present

## 2023-09-15 DIAGNOSIS — Z0001 Encounter for general adult medical examination with abnormal findings: Secondary | ICD-10-CM | POA: Diagnosis not present

## 2023-09-15 DIAGNOSIS — I482 Chronic atrial fibrillation, unspecified: Secondary | ICD-10-CM | POA: Diagnosis not present

## 2023-09-15 DIAGNOSIS — I10 Essential (primary) hypertension: Secondary | ICD-10-CM | POA: Diagnosis not present

## 2023-09-23 DIAGNOSIS — I4891 Unspecified atrial fibrillation: Secondary | ICD-10-CM | POA: Diagnosis not present

## 2023-09-23 DIAGNOSIS — E782 Mixed hyperlipidemia: Secondary | ICD-10-CM | POA: Diagnosis not present

## 2023-09-23 DIAGNOSIS — I10 Essential (primary) hypertension: Secondary | ICD-10-CM | POA: Diagnosis not present

## 2023-09-23 DIAGNOSIS — R051 Acute cough: Secondary | ICD-10-CM | POA: Diagnosis not present

## 2023-09-23 DIAGNOSIS — J209 Acute bronchitis, unspecified: Secondary | ICD-10-CM | POA: Diagnosis not present

## 2023-10-07 ENCOUNTER — Telehealth: Payer: Self-pay | Admitting: *Deleted

## 2023-10-07 NOTE — Telephone Encounter (Signed)
 Per provider moved apapt from 8/22 to 8/29. Patient aware of new date/time

## 2023-10-19 DIAGNOSIS — J309 Allergic rhinitis, unspecified: Secondary | ICD-10-CM | POA: Diagnosis not present

## 2023-10-19 DIAGNOSIS — J019 Acute sinusitis, unspecified: Secondary | ICD-10-CM | POA: Diagnosis not present

## 2023-10-19 DIAGNOSIS — I4891 Unspecified atrial fibrillation: Secondary | ICD-10-CM | POA: Diagnosis not present

## 2023-10-21 DIAGNOSIS — I4891 Unspecified atrial fibrillation: Secondary | ICD-10-CM | POA: Diagnosis not present

## 2023-10-21 DIAGNOSIS — I10 Essential (primary) hypertension: Secondary | ICD-10-CM | POA: Diagnosis not present

## 2023-10-21 DIAGNOSIS — E782 Mixed hyperlipidemia: Secondary | ICD-10-CM | POA: Diagnosis not present

## 2023-10-27 ENCOUNTER — Telehealth: Payer: Self-pay | Admitting: Cardiology

## 2023-10-27 DIAGNOSIS — H1033 Unspecified acute conjunctivitis, bilateral: Secondary | ICD-10-CM | POA: Diagnosis not present

## 2023-10-27 NOTE — Telephone Encounter (Signed)
 Left patient voicemail to call us  back to provide more information

## 2023-10-27 NOTE — Telephone Encounter (Signed)
 Pt came in and expressed she has been in Afib off and on. She took a Cardizem  this morning and does not feel it right now. She is going to go home and try to rest and hydrate. She is wanting to know if she can do anything else to help her Afib.   She ask to give her a call with any recommendations and leave a detailed VM if she does not answer.

## 2023-10-29 NOTE — Telephone Encounter (Signed)
 Patient states afib issue was resolved in about 24 hours.  Doing fine now.    She does already have her 6 mo f/u scheduled with Dr. Amanda Jungling for 12/11/2023 here in [place] office.

## 2023-11-20 DIAGNOSIS — I4891 Unspecified atrial fibrillation: Secondary | ICD-10-CM | POA: Diagnosis not present

## 2023-11-20 DIAGNOSIS — E782 Mixed hyperlipidemia: Secondary | ICD-10-CM | POA: Diagnosis not present

## 2023-11-20 DIAGNOSIS — I10 Essential (primary) hypertension: Secondary | ICD-10-CM | POA: Diagnosis not present

## 2023-12-02 ENCOUNTER — Telehealth: Payer: Self-pay | Admitting: Cardiology

## 2023-12-02 NOTE — Telephone Encounter (Signed)
 Patient c/o Palpitations:  STAT if patient reporting lightheadedness, shortness of breath, or chest pain  How long have you had palpitations/irregular HR/ Afib? Are you having the symptoms now? Since Friday, yes  Are you currently experiencing lightheadedness, SOB or CP? lightheadedness  Do you have a history of afib (atrial fibrillation) or irregular heart rhythm? Yes afib  Have you checked your BP or HR? (document readings if available): HR 80-95  Are you experiencing any other symptoms? fatigued

## 2023-12-02 NOTE — Telephone Encounter (Signed)
 Patient reported that her A-fib comes and goes. She feels better at time, is a little winded. Took Diltiazem  earlier and laid down some. She feels tired at times, but able to function to do daily tasks. She has an appointment with Dr. Amanda Jungling on 06/20, advised her to keep. She also has taken Metoprolol  to keep heart rate regulated. Checked BP today and was 138/76 HR 86. Gave ER precautions if symptoms worsen. And will route to provider for additional recommendations.

## 2023-12-02 NOTE — Telephone Encounter (Signed)
 Left message for patient to call back

## 2023-12-02 NOTE — Telephone Encounter (Signed)
 Pt will npt be available to speak between 1-4

## 2023-12-07 ENCOUNTER — Emergency Department (HOSPITAL_COMMUNITY)

## 2023-12-07 ENCOUNTER — Encounter (HOSPITAL_COMMUNITY): Payer: Self-pay

## 2023-12-07 ENCOUNTER — Other Ambulatory Visit: Payer: Self-pay

## 2023-12-07 ENCOUNTER — Emergency Department (HOSPITAL_COMMUNITY)
Admission: EM | Admit: 2023-12-07 | Discharge: 2023-12-07 | Disposition: A | Discharge status: Home / Self Care | Attending: Emergency Medicine | Admitting: Emergency Medicine

## 2023-12-07 DIAGNOSIS — R0602 Shortness of breath: Secondary | ICD-10-CM | POA: Diagnosis not present

## 2023-12-07 DIAGNOSIS — Z7901 Long term (current) use of anticoagulants: Secondary | ICD-10-CM | POA: Diagnosis not present

## 2023-12-07 DIAGNOSIS — Z79899 Other long term (current) drug therapy: Secondary | ICD-10-CM | POA: Diagnosis not present

## 2023-12-07 DIAGNOSIS — Z7951 Long term (current) use of inhaled steroids: Secondary | ICD-10-CM | POA: Insufficient documentation

## 2023-12-07 DIAGNOSIS — I10 Essential (primary) hypertension: Secondary | ICD-10-CM | POA: Diagnosis not present

## 2023-12-07 DIAGNOSIS — R600 Localized edema: Secondary | ICD-10-CM | POA: Insufficient documentation

## 2023-12-07 DIAGNOSIS — J45909 Unspecified asthma, uncomplicated: Secondary | ICD-10-CM | POA: Diagnosis not present

## 2023-12-07 DIAGNOSIS — Z4682 Encounter for fitting and adjustment of non-vascular catheter: Secondary | ICD-10-CM | POA: Diagnosis not present

## 2023-12-07 DIAGNOSIS — I159 Secondary hypertension, unspecified: Secondary | ICD-10-CM | POA: Insufficient documentation

## 2023-12-07 DIAGNOSIS — R059 Cough, unspecified: Secondary | ICD-10-CM | POA: Diagnosis not present

## 2023-12-07 DIAGNOSIS — R42 Dizziness and giddiness: Secondary | ICD-10-CM | POA: Diagnosis not present

## 2023-12-07 LAB — CBC WITH DIFFERENTIAL/PLATELET
Abs Immature Granulocytes: 0.03 10*3/uL (ref 0.00–0.07)
Basophils Absolute: 0 10*3/uL (ref 0.0–0.1)
Basophils Relative: 0 %
Eosinophils Absolute: 0.1 10*3/uL (ref 0.0–0.5)
Eosinophils Relative: 1 %
HCT: 40 % (ref 36.0–46.0)
Hemoglobin: 13.6 g/dL (ref 12.0–15.0)
Immature Granulocytes: 0 %
Lymphocytes Relative: 29 %
Lymphs Abs: 2.2 10*3/uL (ref 0.7–4.0)
MCH: 32.8 pg (ref 26.0–34.0)
MCHC: 34 g/dL (ref 30.0–36.0)
MCV: 96.4 fL (ref 80.0–100.0)
Monocytes Absolute: 0.8 10*3/uL (ref 0.1–1.0)
Monocytes Relative: 10 %
Neutro Abs: 4.6 10*3/uL (ref 1.7–7.7)
Neutrophils Relative %: 60 %
Platelets: 343 10*3/uL (ref 150–400)
RBC: 4.15 MIL/uL (ref 3.87–5.11)
RDW: 12.6 % (ref 11.5–15.5)
WBC: 7.7 10*3/uL (ref 4.0–10.5)
nRBC: 0 % (ref 0.0–0.2)

## 2023-12-07 LAB — COMPREHENSIVE METABOLIC PANEL WITH GFR
ALT: 18 U/L (ref 0–44)
AST: 21 U/L (ref 15–41)
Albumin: 4.4 g/dL (ref 3.5–5.0)
Alkaline Phosphatase: 57 U/L (ref 38–126)
Anion gap: 10 (ref 5–15)
BUN: 17 mg/dL (ref 8–23)
CO2: 24 mmol/L (ref 22–32)
Calcium: 9.5 mg/dL (ref 8.9–10.3)
Chloride: 101 mmol/L (ref 98–111)
Creatinine, Ser: 0.78 mg/dL (ref 0.44–1.00)
GFR, Estimated: >60 mL/min (ref >60)
Glucose, Bld: 94 mg/dL (ref 70–99)
Potassium: 4.2 mmol/L (ref 3.5–5.1)
Sodium: 135 mmol/L (ref 135–145)
Total Bilirubin: 0.6 mg/dL (ref 0.0–1.2)
Total Protein: 7.8 g/dL (ref 6.5–8.1)

## 2023-12-07 LAB — BRAIN NATRIURETIC PEPTIDE: B Natriuretic Peptide: 94 pg/mL (ref 0.0–100.0)

## 2023-12-07 LAB — TROPONIN I (HIGH SENSITIVITY)
Troponin I (High Sensitivity): 6 ng/L (ref <18)
Troponin I (High Sensitivity): 6 ng/L (ref <18)

## 2023-12-07 MED ORDER — HYDRALAZINE HCL 10 MG PO TABS
10.0000 mg | ORAL_TABLET | Freq: Once | ORAL | Status: AC
  Administered 2023-12-07: 10 mg via ORAL
  Filled 2023-12-07: qty 1

## 2023-12-07 MED ORDER — HYDRALAZINE HCL 20 MG/ML IJ SOLN: 5.0000 mg | Freq: Once | INTRAMUSCULAR | Status: DC

## 2023-12-07 NOTE — ED Provider Notes (Signed)
 April [facility] AT [facility] Provider Note   CSN: [id number] Arrival date & time: 12/07/23  1845     Patient presents with: Shortness of Breath   April Guerra is a 78 y.o. female.   Patient is a 78 year old female with past medical history of atrial fibrillation on Cardizem  and Eliquis , hypertension, hyperlipidemia, and asthma presenting for complaints of shortness of breath.  She denies any fevers, chills, coughing.  She denies any chest pain.  She states that she believes she is going in and out of A-fib when she has her symptoms due to shortness of breath and feelings of palpitations.  She denies any current shortness of breath.  She is in normal sinus rhythm with a rate of 69 bpm currently.  Occasional PVCs seen only.  She admits to chronic lower extremity swelling but states she is doing well.  Due to a low salt diet.  Of note, blood pressure was 190/79.  She states she is taken her home blood pressure medications including metoprolol  XL 100 mg daily and Norvasc  10 mg daily.  She has a as needed Lasix  medication that she has not taken in the last 7 days.  The history is provided by the patient. No language interpreter was used.  Shortness of Breath Associated symptoms: no abdominal pain, no chest pain, no cough, no ear pain, no fever, no rash, no sore throat and no vomiting        Prior to Admission medications   Medication Sig Start Date End Date Taking? Authorizing Provider  acetaminophen  (TYLENOL ) 500 MG tablet Take 1,000 mg by mouth 3 (three) times daily.    [provider]  albuterol  (VENTOLIN  HFA) 108 (90 Base) MCG/ACT inhaler Inhale 2 puffs into the lungs every 4 (four) hours as needed for wheezing or shortness of breath. 05/09/20   Colin Dawley, MD  amLODipine  (NORVASC ) 10 MG tablet Take 1 tablet (10 mg total) by mouth daily with lunch. For BP 05/09/20   Emokpae, Courage, MD  apixaban  (ELIQUIS ) 5 MG TABS tablet Take 1 tablet (5 mg  total) by mouth 2 (two) times daily. 07/13/23   Laurann Pollock, MD  Calcium  Carb-Cholecalciferol  (CALCIUM  600/VITAMIN D PO) Take 600 mg by mouth in the morning and at bedtime.    [provider]  cetirizine (ZYRTEC) 10 MG tablet Take 10 mg by mouth daily.    [provider]  Cholecalciferol  (VITAMIN D) 125 MCG (5000 UT) CAPS Take 5,000 Units by mouth daily.    [provider]  Coenzyme Q10 (COQ10) 100 MG CAPS Take 100 mg by mouth at bedtime.     [provider]  cyclobenzaprine  (FLEXERIL ) 10 MG tablet Take 5 mg by mouth See admin instructions. Take 5 mg at night, may take a second 5 mg dose during the day as needed for muscle spasms 04/22/19   [provider]  diltiazem  (CARDIZEM ) 30 MG tablet Take 1 tablet every 4 hours AS NEEDED for AFIB heart rate >100 06/12/23   Laurann Pollock, MD  famotidine  (PEPCID ) 40 MG tablet Take 40 mg by mouth daily. 03/17/23   [provider]  fluticasone  (FLONASE ) 50 MCG/ACT nasal spray Place 1 spray into both nostrils daily. 11/11/19   [provider]  furosemide  (LASIX ) 20 MG tablet TAKE 1 TABLET BY MOUTH DAILY AS  NEEDED FOR SWELLING 06/02/22   Laurann Pollock, MD  gabapentin  (NEURONTIN ) 300 MG capsule Take 300 mg by mouth daily.  [provider]  lisinopril  (ZESTRIL ) 20 MG tablet Take 20 mg by mouth daily. 02/24/23   [provider]  Magnesium  100 MG TABS Take 50 mg by mouth at bedtime.    [provider]  melatonin 5 MG TABS Take 2.5 mg by mouth at bedtime as needed (Sleep).    [provider]  metoprolol  succinate (TOPROL -XL) 100 MG 24 hr tablet Take 1 tablet (100 mg total) by mouth in the morning. 11/03/22   Laurann Pollock, MD  Multiple Vitamins-Minerals (CENTRUM SILVER  ADULT 50+ PO) Take 1 tablet by mouth daily.    [provider]  Omega-3 Fatty Acids (FISH OIL) 1000 MG CAPS Take 1,000 mg by mouth daily.    [provider]  oseltamivir  (TAMIFLU) 75 MG capsule Take 75 mg by mouth daily. 09/08/23   [provider]  pantoprazole  (PROTONIX ) 40 MG tablet Take 1 tablet (40 mg total) by mouth daily. 08/07/20 01/23/79  Urban Garden, MD  polyethylene glycol powder (GLYCOLAX/MIRALAX) 17 GM/SCOOP powder Take 17 g by mouth daily as needed for mild constipation or moderate constipation (With lunch).    [provider]  Polyvinyl Alcohol -Povidone PF (REFRESH) 1.4-0.6 % SOLN Apply 1 drop to eye as needed.    [provider]  simvastatin  (ZOCOR ) 20 MG tablet Take 20 mg by mouth at bedtime. 05/28/21   [provider]  sodium chloride  (OCEAN) 0.65 % SOLN nasal spray Place 1 spray into both nostrils See admin instructions. Use 1 spray in each nostril in the morning may use a second dose at night as needed for congestion    [provider]  triamcinolone  (KENALOG ) 0.1 % Apply 1 application topically daily as needed (irritation). 05/22/20   [provider]  vitamin C  (ASCORBIC ACID ) 500 MG tablet Take 1,000 mg by mouth 2 (two) times daily.    [provider]  Vitamins A & D (VITAMIN A & D) ointment Apply 1 application topically daily as needed for dry skin (irritation).    [provider]  prochlorperazine  (COMPAZINE ) 10 MG tablet Take 1 tablet (10 mg total) by mouth every 6 (six) hours as needed (Nausea or vomiting). 05/29/20 12/14/21  Gudena, Vinay, MD    Allergies: Morphine  and codeine and Zofran  [ondansetron ]    Review of Systems  Constitutional:  Negative for chills and fever.  HENT:  Negative for ear pain and sore throat.   Eyes:  Negative for pain and visual disturbance.  Respiratory:  Positive for shortness of breath. Negative for cough.   Cardiovascular:  Negative for chest pain and palpitations.  Gastrointestinal:  Negative for abdominal pain and vomiting.  Genitourinary:  Negative for dysuria and hematuria.  Musculoskeletal:  Negative for arthralgias and back  pain.  Skin:  Negative for color change and rash.  Neurological:  Negative for seizures and syncope.  All other systems reviewed and are negative.   Updated Vital Signs BP (!) 169/81   Pulse 61   Temp 98 F (36.7 C) (Oral)   Resp 14   Ht 4' 9 (1.448 m)   Wt 78.5 kg   SpO2 97%   BMI 37.45 kg/m   Physical Exam Vitals and nursing note reviewed.  Constitutional:      General: She is not in acute distress.    Appearance: She is well-developed.  HENT:     Head: Normocephalic and atraumatic.   Eyes:     Conjunctiva/sclera: Conjunctivae normal.    Cardiovascular:  Rate and Rhythm: Normal rate and regular rhythm.     Heart sounds: No murmur heard. Pulmonary:     Effort: Pulmonary effort is normal. No respiratory distress.     Breath sounds: Normal breath sounds.  Abdominal:     Palpations: Abdomen is soft.     Tenderness: There is no abdominal tenderness.   Musculoskeletal:        General: No swelling.     Cervical back: Neck supple.     Right lower leg: 1+ Edema present.     Left lower leg: 1+ Edema present.   Skin:    General: Skin is warm and dry.     Capillary Refill: Capillary refill takes less than 2 seconds.   Neurological:     Mental Status: She is alert.   Psychiatric:        Mood and Affect: Mood normal.     (all labs ordered are listed, but only abnormal results are displayed) Labs Reviewed  BRAIN NATRIURETIC PEPTIDE  CBC WITH DIFFERENTIAL/PLATELET  COMPREHENSIVE METABOLIC PANEL WITH GFR  TROPONIN I (HIGH SENSITIVITY)  TROPONIN I (HIGH SENSITIVITY)    EKG: EKG Interpretation Date/Time:  Monday December 07 2023 18:55:24 EDT Ventricular Rate:  67 PR Interval:  176 QRS Duration:  80 QT Interval:  392 QTC Calculation: 414 R Axis:   3  Text Interpretation: Normal sinus rhythm Minimal voltage criteria for LVH, may be normal variant ( R in aVL ) Confirmed by Iva Mariner [phone]) on 12/07/2023 10:23:26 PM  Radiology: DG Chest 2 View Result Date:  12/07/2023 EXAM: 2 VIEW(S) XRAY OF THE CHEST 12/07/2023 07:26:00 PM COMPARISON: 05/24/2023 CLINICAL HISTORY: SOB. Pt arrived via POV from home c/o SOB, and feeling like her heart is fluttering for the past few days. Pt reports calling her cardiologist and was advised if she couldn't wait until this Friday to be seen in his office, then, to go to the ER for evaluation. Pt reports taking prescribed Cardizem  w/o relief. Pt complains of light headedness, cough for the last 9 days. Pt denies chest pain. FINDINGS: LUNGS AND PLEURA: No focal pulmonary opacity. No pulmonary edema. No pleural effusion. No pneumothorax. HEART AND MEDIASTINUM: No acute abnormality of the cardiac and mediastinal silhouettes. BONES AND SOFT TISSUES: Mild degenerative changes of the mid/lower thoracic spine. Cholecystectomy clips. No acute osseous abnormality. LINES AND TUBES: Right chest port terminates in the lower SVC. IMPRESSION: 1. No acute findings. Electronically signed by: Zadie Herter MD 12/07/2023 07:29 PM EDT RP Workstation: [id number]     Procedures   Medications Ordered in the ED  hydrALAZINE  (APRESOLINE ) injection 5 mg (has no administration in time range)    Clinical Course as of 12/07/23 2240  Mon Dec 07, 2023  2225 Comprehensive metabolic panel [AG]    Clinical Course User Index [AG] Quinn Bucco, DO                                 Medical Decision Making Amount and/or Complexity of Data Reviewed Labs: ordered. Decision-making details documented in ED Course. Radiology: ordered.    78 year old female with past medical history of atrial fibrillation on Cardizem  and Eliquis , hypertension, hyperlipidemia, and asthma presenting for complaints of shortness of breath.  Patient is alert and oriented x 3, no acute distress, afebrile, hypertensive at 190/79 with otherwise stable vital signs.  Physical exam demonstrates equal bilateral breath sounds with no adventitious lung sounds.  History  of asthma but no  wheezing on exam.  Twelve-lead EKG demonstrates sinus rhythm with a stable rate.  No ST segment ovation or depression.  Troponin within normal limits.  Low suspicion myocardial infarction.  Patient history of history of A-fib but is on Eliquis  with no missed doses.  Pulmonary embolism considered however less likely due to patient's symptoms being intermittent and only associated with episodes where she thinks she is in A-fib.  Currently she is not having any symptoms of shortness of breath and no episodes of A-fib were caught during today's visit.  She does have a few runs of bigeminy.  Her electrolytes were stable today.  Her chest x-ray demonstrates no acute process.  No pneumothorax, pleural effusions, or pneumonias.  Her BNP is within normal limits.  No anemia.  No hypoxia.  Medication for hydralazine  given for further blood pressure control.  Patient has a follow-up visit with her cardiologist this Friday which is in 4 days.  She is recommended to return to emergency [facility] mediately if she continues to have the symptoms.  She is also recommended to check her heart rate during these symptoms to see if she is in A-fib with a rapid heart rate.  Patient in no distress and overall condition improved here in the ED. Detailed discussions were had with the patient regarding current findings, and need for close f/u with PCP or on call doctor. The patient has been instructed to return immediately if the symptoms worsen in any way for re-evaluation. Patient verbalized understanding and is in agreement with current care plan. All questions answered prior to discharge.      Final diagnoses:  Secondary hypertension  SOB (shortness of breath)    ED Discharge Orders     None          Quinn Bucco, DO 12/07/23 2240

## 2023-12-07 NOTE — ED Notes (Signed)
Patient verbalizes understanding of discharge instructions. Opportunity for questioning and answers were provided. Armband removed by staff, pt discharged from ED. Wheeled out to car with husband

## 2023-12-07 NOTE — ED Triage Notes (Signed)
 Pt arrived via POV from home c/o SOB, and feeling like her heart is fluttering for the past few days. Pt reports calling her cardiologist and was advised if she couldn't wait until this Friday to be seen in his office, then, to go to the ER for evaluation. Pt reports taking prescribed Cardizem  w/o relief.

## 2023-12-07 NOTE — Discharge Instructions (Signed)
 Today you had a stable EKG, cardiac workup including troponins, and electrolytes.  You had a stable chest x-ray demonstrating no infection or fluid retention.  You had multiple PVCs which can result in some shortness of breath that they are frequent however currently it is nothing that is acutely life-threatening.  There is also the chance that you could have had an episode of A-fib with a rapid rate that we just are not catching during your visit today.  Your blood pressure was elevated at 190/79 on arrival and can also attribute to feelings of shortness of breath.  You are giving addition short acting medication called hydralazine .  Please continue to adhere to a low-sodium diet and follow closely with your cardiology team for blood pressure recheck.  You are low risk for pulmonary embolism due to taking a blood thinner medication called Eliquis .  Please return to emergency department mediately for any worsening signs or symptoms.

## 2023-12-09 ENCOUNTER — Other Ambulatory Visit: Payer: Self-pay

## 2023-12-09 ENCOUNTER — Emergency Department (HOSPITAL_COMMUNITY)

## 2023-12-09 ENCOUNTER — Emergency Department (HOSPITAL_COMMUNITY)
Admission: EM | Admit: 2023-12-09 | Discharge: 2023-12-09 | Disposition: A | Discharge status: Home / Self Care | Attending: Emergency Medicine | Admitting: Emergency Medicine

## 2023-12-09 DIAGNOSIS — I4891 Unspecified atrial fibrillation: Secondary | ICD-10-CM | POA: Diagnosis not present

## 2023-12-09 DIAGNOSIS — I480 Paroxysmal atrial fibrillation: Secondary | ICD-10-CM | POA: Diagnosis not present

## 2023-12-09 DIAGNOSIS — Z7901 Long term (current) use of anticoagulants: Secondary | ICD-10-CM | POA: Diagnosis not present

## 2023-12-09 DIAGNOSIS — R079 Chest pain, unspecified: Secondary | ICD-10-CM | POA: Diagnosis present

## 2023-12-09 DIAGNOSIS — R42 Dizziness and giddiness: Secondary | ICD-10-CM | POA: Diagnosis not present

## 2023-12-09 DIAGNOSIS — R0602 Shortness of breath: Secondary | ICD-10-CM | POA: Diagnosis not present

## 2023-12-09 LAB — CBC
HCT: 43.2 % (ref 36.0–46.0)
Hemoglobin: 14.6 g/dL (ref 12.0–15.0)
MCH: 33.1 pg (ref 26.0–34.0)
MCHC: 33.8 g/dL (ref 30.0–36.0)
MCV: 98 fL (ref 80.0–100.0)
Platelets: 366 10*3/uL (ref 150–400)
RBC: 4.41 MIL/uL (ref 3.87–5.11)
RDW: 12.8 % (ref 11.5–15.5)
WBC: 6.7 10*3/uL (ref 4.0–10.5)
nRBC: 0 % (ref 0.0–0.2)

## 2023-12-09 LAB — BASIC METABOLIC PANEL WITH GFR
Anion gap: 10 (ref 5–15)
BUN: 15 mg/dL (ref 8–23)
CO2: 25 mmol/L (ref 22–32)
Calcium: 10.1 mg/dL (ref 8.9–10.3)
Chloride: 97 mmol/L — ABNORMAL LOW (ref 98–111)
Creatinine, Ser: 0.57 mg/dL (ref 0.44–1.00)
GFR, Estimated: >60 mL/min (ref >60)
Glucose, Bld: 106 mg/dL — ABNORMAL HIGH (ref 70–99)
Potassium: 4.2 mmol/L (ref 3.5–5.1)
Sodium: 132 mmol/L — ABNORMAL LOW (ref 135–145)

## 2023-12-09 LAB — TROPONIN I (HIGH SENSITIVITY)
Troponin I (High Sensitivity): 6 ng/L (ref <18)
Troponin I (High Sensitivity): 6 ng/L (ref <18)

## 2023-12-09 LAB — MAGNESIUM: Magnesium: 2.2 mg/dL (ref 1.7–2.4)

## 2023-12-09 MED ORDER — DILTIAZEM HCL ER COATED BEADS 120 MG PO CP24: 120.0000 mg | ORAL_CAPSULE | Freq: Every day | ORAL | 0 refills | Status: DC

## 2023-12-09 MED ORDER — DILTIAZEM LOAD VIA INFUSION: 10.0000 mg | Freq: Once | INTRAVENOUS | Status: DC

## 2023-12-09 MED ORDER — DILTIAZEM HCL 25 MG/5ML IV SOLN
10.0000 mg | Freq: Once | INTRAVENOUS | Status: AC
  Administered 2023-12-09: 10 mg via INTRAVENOUS
  Filled 2023-12-09: qty 5

## 2023-12-09 MED ORDER — DILTIAZEM HCL ER COATED BEADS 120 MG PO CP24
120.0000 mg | ORAL_CAPSULE | Freq: Every day | ORAL | Status: DC
  Administered 2023-12-09: 120 mg via ORAL
  Filled 2023-12-09: qty 1

## 2023-12-09 NOTE — ED Triage Notes (Signed)
 Pt arrived with husband from home reporting chest pain and shob with intermittent dizziness. States she has been feeling this was for about a week. Has appt with cardiology but cannot wait until then. Denies any other complaints at this time. Patient is currently on Cardizem .

## 2023-12-09 NOTE — ED Provider Notes (Addendum)
 [place] EMERGENCY DEPARTMENT AT [facility] Provider Note   CSN: [id number] Arrival date & time: 12/09/23  4098     Patient presents with: Chest Pain   April Guerra is a 78 y.o. female.   HPI Patient has history of atrial fibrillation.  She is anticoagulated on Eliquis  and takes 100 mg Toprol -XL in the mornings.  She has Cardizem  prescribed to take on an as needed basis for heart rates over 100.  Patient reports that over the past few days to week she has been getting more experiences of feeling like her heart is racing.  She has also been experiencing increased sensation of lightheadedness and presyncope.  Patient has not had any passing out episodes.  She does however feel that she gets nauseated and lightheaded.  She has taken several of her as needed doses of Cardizem .  She is unsure if it is helping much.  She reports previously she had been on Cardizem  regularly and her heart rates had gotten too slow.  She denies she is having any chest pain.  No lower extremity swelling or calf pain.    Prior to Admission medications   Medication Sig Start Date End Date Taking? Authorizing Provider  diltiazem  (CARDIZEM  CD) 120 MG 24 hr capsule Take 1 capsule (120 mg total) by mouth daily. 12/09/23  Yes Wynetta Heckle, MD  acetaminophen  (TYLENOL ) 500 MG tablet Take 1,000 mg by mouth 3 (three) times daily.    [provider]  albuterol  (VENTOLIN  HFA) 108 (90 Base) MCG/ACT inhaler Inhale 2 puffs into the lungs every 4 (four) hours as needed for wheezing or shortness of breath. 05/09/20   Colin Dawley, MD  amLODipine  (NORVASC ) 10 MG tablet Take 1 tablet (10 mg total) by mouth daily with lunch. For BP 05/09/20   Emokpae, Courage, MD  apixaban  (ELIQUIS ) 5 MG TABS tablet Take 1 tablet (5 mg total) by mouth 2 (two) times daily. 07/13/23   Laurann Pollock, MD  Calcium  Carb-Cholecalciferol  (CALCIUM  600/VITAMIN D PO) Take 600 mg by mouth in the morning and at bedtime.    [provider]  cetirizine (ZYRTEC) 10 MG tablet Take 10 mg by mouth daily.    [provider]  Cholecalciferol  (VITAMIN D) 125 MCG (5000 UT) CAPS Take 5,000 Units by mouth daily.    [provider]  Coenzyme Q10 (COQ10) 100 MG CAPS Take 100 mg by mouth at bedtime.     [provider]  cyclobenzaprine  (FLEXERIL ) 10 MG tablet Take 5 mg by mouth See admin instructions. Take 5 mg at night, may take a second 5 mg dose during the day as needed for muscle spasms 04/22/19   [provider]  diltiazem  (CARDIZEM ) 30 MG tablet Take 1 tablet every 4 hours AS NEEDED for AFIB heart rate >100 06/12/23   Laurann Pollock, MD  famotidine  (PEPCID ) 40 MG tablet Take 40 mg by mouth daily. 03/17/23   [provider]  fluticasone  (FLONASE ) 50 MCG/ACT nasal spray Place 1 spray into both nostrils daily. 11/11/19   [provider]  furosemide  (LASIX ) 20 MG tablet TAKE 1 TABLET BY MOUTH DAILY AS  NEEDED FOR SWELLING 06/02/22   Branch, Joyceann No, MD  gabapentin  (NEURONTIN ) 300 MG capsule Take 300 mg by mouth daily.    [provider]  lisinopril  (ZESTRIL ) 20 MG tablet Take 20 mg by mouth daily. 02/24/23   [provider]  Magnesium  100 MG TABS Take 50 mg by mouth at bedtime.  [provider]  melatonin 5 MG TABS Take 2.5 mg by mouth at bedtime as needed (Sleep).    [provider]  metoprolol  succinate (TOPROL -XL) 100 MG 24 hr tablet Take 1 tablet (100 mg total) by mouth in the morning. 11/03/22   Laurann Pollock, MD  Multiple Vitamins-Minerals (CENTRUM SILVER  ADULT 50+ PO) Take 1 tablet by mouth daily.    [provider]  Omega-3 Fatty Acids (FISH OIL) 1000 MG CAPS Take 1,000 mg by mouth daily.    [provider]  oseltamivir (TAMIFLU) 75 MG capsule Take 75 mg by mouth daily. 09/08/23   [provider]  pantoprazole  (PROTONIX ) 40 MG tablet Take 1 tablet (40 mg total) by mouth daily. 08/07/20 01/23/79   Urban Garden, MD  polyethylene glycol powder (GLYCOLAX/MIRALAX) 17 GM/SCOOP powder Take 17 g by mouth daily as needed for mild constipation or moderate constipation (With lunch).    [provider]  Polyvinyl Alcohol -Povidone PF (REFRESH) 1.4-0.6 % SOLN Apply 1 drop to eye as needed.    [provider]  simvastatin  (ZOCOR ) 20 MG tablet Take 20 mg by mouth at bedtime. 05/28/21   [provider]  sodium chloride  (OCEAN) 0.65 % SOLN nasal spray Place 1 spray into both nostrils See admin instructions. Use 1 spray in each nostril in the morning may use a second dose at night as needed for congestion    [provider]  triamcinolone  (KENALOG ) 0.1 % Apply 1 application topically daily as needed (irritation). 05/22/20   [provider]  vitamin C  (ASCORBIC ACID ) 500 MG tablet Take 1,000 mg by mouth 2 (two) times daily.    [provider]  Vitamins A & D (VITAMIN A & D) ointment Apply 1 application topically daily as needed for dry skin (irritation).    [provider]  prochlorperazine  (COMPAZINE ) 10 MG tablet Take 1 tablet (10 mg total) by mouth every 6 (six) hours as needed (Nausea or vomiting). 05/29/20 12/14/21  Gudena, Vinay, MD    Allergies: Morphine  and codeine and Zofran  [ondansetron ]    Review of Systems  Updated Vital Signs BP (!) 174/68   Pulse (!) 48   Temp 98.2 F (36.8 C) (Oral)   Resp 16   Ht 4' 9 (1.448 m)   Wt 78.5 kg   SpO2 99%   BMI 37.45 kg/m   Physical Exam Constitutional:      Comments: Alert nontoxic clinically well in appearance.  No acute distress.  HENT:     Head: Normocephalic and atraumatic.     Mouth/Throat:     Pharynx: Oropharynx is clear.   Eyes:     Extraocular Movements: Extraocular movements intact.    Cardiovascular:     Rate and Rhythm: Normal rate and regular rhythm.     Comments: Patient is having normal sinus rhythm with PACs.  On the monitor I have also observed a brief  accelerated narrow complex rhythm of about 8-10 beats.  This appears consistent with brief episodes of rapid A-fib. Pulmonary:     Effort: Pulmonary effort is normal.     Breath sounds: Normal breath sounds.  Abdominal:     General: There is no distension.     Palpations: Abdomen is soft.     Tenderness: There is no abdominal tenderness. There is no guarding.   Musculoskeletal:        General: No swelling or tenderness. Normal range of motion.     Right lower leg: No edema.  Left lower leg: No edema.   Skin:    General: Skin is warm and dry.   Neurological:     General: No focal deficit present.     Mental Status: She is oriented to person, place, and time.     Motor: No weakness.     Coordination: Coordination normal.   Psychiatric:        Mood and Affect: Mood normal.     (all labs ordered are listed, but only abnormal results are displayed) Labs Reviewed  BASIC METABOLIC PANEL WITH GFR - Abnormal; Notable for the following components:      Result Value   Sodium 132 (*)    Chloride 97 (*)    Glucose, Bld 106 (*)    All other components within normal limits  MAGNESIUM   CBC  TROPONIN I (HIGH SENSITIVITY)  TROPONIN I (HIGH SENSITIVITY)    EKG: None Tracings not uploading from MUSE. Imaged tracing was first, and never uploaded. Repeat tracing is uploaded but read not importing.  Radiology: DG Chest 2 View Result Date: 12/09/2023 CLINICAL DATA:  Short of breath EXAM: CHEST - 2 VIEW COMPARISON:  12/07/2023 FINDINGS: Normal mediastinum and cardiac silhouette. Normal pulmonary vasculature. No evidence of effusion, infiltrate, or pneumothorax. No acute bony abnormality. Port in the anterior chest wall with tip in distal SVC. No interval change IMPRESSION: No acute findings.  No interval change. Electronically Signed   By: Deboraha Fallow M.D.   On: 12/09/2023 10:52   DG Chest 2 View Result Date: 12/07/2023 EXAM: 2 VIEW(S) XRAY OF THE CHEST 12/07/2023 07:26:00 PM  COMPARISON: 05/24/2023 CLINICAL HISTORY: SOB. Pt arrived via POV from home c/o SOB, and feeling like her heart is fluttering for the past few days. Pt reports calling her cardiologist and was advised if she couldn't wait until this Friday to be seen in his office, then, to go to the ER for evaluation. Pt reports taking prescribed Cardizem  w/o relief. Pt complains of light headedness, cough for the last 9 days. Pt denies chest pain. FINDINGS: LUNGS AND PLEURA: No focal pulmonary opacity. No pulmonary edema. No pleural effusion. No pneumothorax. HEART AND MEDIASTINUM: No acute abnormality of the cardiac and mediastinal silhouettes. BONES AND SOFT TISSUES: Mild degenerative changes of the mid/lower thoracic spine. Cholecystectomy clips. No acute osseous abnormality. LINES AND TUBES: Right chest port terminates in the lower SVC. IMPRESSION: 1. No acute findings. Electronically signed by: Zadie Herter MD 12/07/2023 07:29 PM EDT RP Workstation: [id number]     Procedures  Cardiac monitor reviewed several times. While doing first exam, monitor showed controlled rate sinus rhythm with frequent PACs.  Rates were in the 70s to 80s.  There was an occasional rapid narrow complex event about 8-10 beats that then resolved.  Patient did feel symptomatic for this.  Monitor reassessed after administration of Cardizem  10 mg IV.  Monitor maintained normal sinus rhythm in the 60s.  Patient subjectively reports feeling much better since having had the Cardizem  and has not experienced episodes of racing.  CRITICAL CARE Performed by: Wynetta Heckle   Total critical care time: 30 minutes  Critical care time was exclusive of separately billable procedures and treating other patients.  Critical care was necessary to treat or prevent imminent or life-threatening deterioration.  Critical care was time spent personally by me on the following activities: development of treatment plan with patient and/or surrogate as well  as nursing, discussions with consultants, evaluation of patient's response to treatment, examination of patient, obtaining history from  patient or surrogate, ordering and performing treatments and interventions, ordering and review of laboratory studies, ordering and review of radiographic studies, pulse oximetry and re-evaluation of patient's condition.  Medications Ordered in the ED  diltiazem  (CARDIZEM  CD) 24 hr capsule 120 mg (120 mg Oral Given 12/09/23 1157)  diltiazem  (CARDIZEM ) injection 10 mg (10 mg Intravenous Given 12/09/23 0936)                                    Medical Decision Making Amount and/or Complexity of Data Reviewed Labs: ordered. Radiology: ordered.  Risk Prescription drug management.  Patient presents as outlined.  She does have known history of atrial fibrillation but is controlled typically in sinus rhythm with Toprol -XL and Eliquis .  Patient has become more symptomatic over the past week experiencing palpitations and lightheadedness bordering on near syncope.  She has also had the perception of increased shortness of breath.  Will proceed with cardiac enzymes, monitoring, basic lab work for electrolytes.  During first assessment monitor did show some brief episodes of narrow complex A-fib that spontaneously resolved.  Patient was symptomatic.  Will administer Cardizem  10 mg IV.  Troponins returned normal at 6.  Magnesium  2.2.  GFR greater than 60.  Potassium 4.2.  CBC normal.  Due to chest x-ray reviewed by radiology no acute findings.  Patient has known paroxysmal atrial fibrillation and is appropriately anticoagulated and compliant.  She has taken Cardizem  on a as needed basis for elevated heart rate.  At this time she has responded well to an IV dose of Cardizem  and feels improved.  I will have the patient continue her Toprol -XL and add a low-dose of Cardizem  CR at 120 mg a day.  Patient will continue to monitor heart rate and blood pressures.  She has follow-up  with Dr. Amanda Jungling on Friday.  Return precautions reviewed.  Patient stable for discharge.      Final diagnoses:  Paroxysmal A-fib (HCC)  Light-headed feeling    ED Discharge Orders          Ordered    diltiazem  (CARDIZEM  CD) 120 MG 24 hr capsule  Daily        12/09/23 1134               Wynetta Heckle, MD 12/09/23 1142    Wynetta Heckle, MD 12/09/23 1202

## 2023-12-09 NOTE — ED Notes (Signed)
 IV access established and basic labs drawn. Lt green/lavender sent to lab along w/a clean catch urine specimen.

## 2023-12-09 NOTE — Discharge Instructions (Signed)
 1.  Start taking Cardizem  extended release in the mornings.  Your first dose was given in the emergency department.  Take your blood pressure 2-3 times a day.  Keep a log of your blood pressures. 2.  Continue all of your other regularly prescribed medications. 3.  Follow-up with Dr. Amanda Jungling Friday as planned. 4.  Return to the emergency department if you have concerning symptoms of lightheadedness, racing heart, chest pain or other concerning changes.

## 2023-12-11 ENCOUNTER — Encounter: Payer: Self-pay | Admitting: Cardiology

## 2023-12-11 ENCOUNTER — Other Ambulatory Visit

## 2023-12-11 ENCOUNTER — Other Ambulatory Visit: Payer: Self-pay | Admitting: Cardiology

## 2023-12-11 ENCOUNTER — Ambulatory Visit: Payer: Medicare Other | Attending: Cardiology | Admitting: Cardiology

## 2023-12-11 VITALS — BP 148/82 | HR 70 | Ht <= 58 in | Wt 173.8 lb

## 2023-12-11 DIAGNOSIS — R002 Palpitations: Secondary | ICD-10-CM

## 2023-12-11 DIAGNOSIS — D6869 Other thrombophilia: Secondary | ICD-10-CM | POA: Diagnosis not present

## 2023-12-11 DIAGNOSIS — I480 Paroxysmal atrial fibrillation: Secondary | ICD-10-CM | POA: Diagnosis not present

## 2023-12-11 NOTE — Progress Notes (Signed)
 Clinical Summary April Guerra is a 78 y.o.female seen today for follow up of the following medical problems.    1.PAF - history of prior DCCV 05/01/2018 in setting of newly diagnosed afib, deemed to be of recent onset within hours to presentation -11/02/22 DCCV in ER   -ER visit 05/24/2023 with SOB and palpitations - found to be in afib with elevated HRs - K 3.8, Mg 2.1 TSH 2.1  - had not missed any doses of anticoag, electively cardiovered in ER   -recent increase in palpitations, presented to ER for evaluation 12/09/2023 - benign workup at the time. EKG SR with PACs.  - reports compliance with meds - diltiazem  120mg  was added to her regimen. Remains on toprol  100mg  daily   Other medical issues not addressed this visit        2. LE edema - some swelling at times, wears compression stockings         3.HTN - frequent urination, would avoid diuretic   -home bp's 130s/70     3. Hyperlipidemia   - she is on simvastatin , reports dose lowered to 20mg  daily about 4 months ago. Symptoms no better.  - history of neuropathy, carpal tunnel   08/2021 TC 188 TG 115 HDL 60 LDL 108     She reports normal cath 2015, complicated by pseudoaneurysm required a surgery     SH: former nurse. Husband April Guerra is also a patient of mine. Past Medical History:  Diagnosis Date   A-fib (HCC)    Anxiety    Arthritis    Asthma    hx of    Cancer (HCC)    ovarian   Dyspnea    with exertion    H/O seasonal allergies    Heart murmur    as aa child    Hypercholesteremia    Hypertension    Spinal stenosis      Allergies  Allergen Reactions   Morphine  And Codeine Nausea And Vomiting   Zofran  [Ondansetron ]     Causes minor constipation, tolerates low doses       Current Outpatient Medications  Medication Sig Dispense Refill   acetaminophen  (TYLENOL ) 500 MG tablet Take 1,000 mg by mouth 3 (three) times daily.     albuterol  (VENTOLIN  HFA) 108 (90 Base) MCG/ACT inhaler  Inhale 2 puffs into the lungs every 4 (four) hours as needed for wheezing or shortness of breath. 18 g 2   apixaban  (ELIQUIS ) 5 MG TABS tablet Take 1 tablet (5 mg total) by mouth 2 (two) times daily. 180 tablet 3   Calcium  Carb-Cholecalciferol  (CALCIUM  600/VITAMIN D PO) Take 600 mg by mouth in the morning and at bedtime.     cetirizine (ZYRTEC) 10 MG tablet Take 10 mg by mouth daily.     Cholecalciferol  (VITAMIN D) 125 MCG (5000 UT) CAPS Take 5,000 Units by mouth daily.     Coenzyme Q10 (COQ10) 100 MG CAPS Take 100 mg by mouth at bedtime.      cyclobenzaprine  (FLEXERIL ) 10 MG tablet Take 5 mg by mouth See admin instructions. Take 5 mg at night, may take a second 5 mg dose during the day as needed for muscle spasms     diltiazem  (CARDIZEM  CD) 120 MG 24 hr capsule Take 1 capsule (120 mg total) by mouth daily. 30 capsule 0   diltiazem  (CARDIZEM ) 30 MG tablet Take 1 tablet every 4 hours AS NEEDED for AFIB heart rate >100 30 tablet 3  famotidine  (PEPCID ) 40 MG tablet Take 40 mg by mouth daily.     fluticasone  (FLONASE ) 50 MCG/ACT nasal spray Place 1 spray into both nostrils daily.     furosemide  (LASIX ) 20 MG tablet TAKE 1 TABLET BY MOUTH DAILY AS  NEEDED FOR SWELLING 100 tablet 2   gabapentin  (NEURONTIN ) 300 MG capsule Take 300 mg by mouth daily.     lisinopril  (ZESTRIL ) 20 MG tablet Take 20 mg by mouth daily.     Magnesium  100 MG TABS Take 50 mg by mouth at bedtime.     melatonin 5 MG TABS Take 2.5 mg by mouth at bedtime as needed (Sleep).     metoprolol  succinate (TOPROL -XL) 100 MG 24 hr tablet Take 1 tablet (100 mg total) by mouth in the morning.     Multiple Vitamins-Minerals (CENTRUM SILVER  ADULT 50+ PO) Take 1 tablet by mouth daily.     Omega-3 Fatty Acids (FISH OIL) 1000 MG CAPS Take 1,000 mg by mouth daily.     oseltamivir (TAMIFLU) 75 MG capsule Take 75 mg by mouth daily.     pantoprazole  (PROTONIX ) 40 MG tablet Take 1 tablet (40 mg total) by mouth daily. 90 tablet 3   polyethylene glycol  powder (GLYCOLAX/MIRALAX) 17 GM/SCOOP powder Take 17 g by mouth daily as needed for mild constipation or moderate constipation (With lunch).     Polyvinyl Alcohol -Povidone PF (REFRESH) 1.4-0.6 % SOLN Apply 1 drop to eye as needed.     simvastatin  (ZOCOR ) 20 MG tablet Take 20 mg by mouth at bedtime.     sodium chloride  (OCEAN) 0.65 % SOLN nasal spray Place 1 spray into both nostrils See admin instructions. Use 1 spray in each nostril in the morning may use a second dose at night as needed for congestion     triamcinolone  (KENALOG ) 0.1 % Apply 1 application topically daily as needed (irritation).     vitamin C  (ASCORBIC ACID ) 500 MG tablet Take 1,000 mg by mouth 2 (two) times daily.     Vitamins A & D (VITAMIN A & D) ointment Apply 1 application topically daily as needed for dry skin (irritation).     No current facility-administered medications for this visit.     Past Surgical History:  Procedure Laterality Date   ABDOMINAL HYSTERECTOMY     BIOPSY  05/08/2020   Procedure: BIOPSY;  Surgeon: Urban Garden, MD;  Location: [facility];  Service: Gastroenterology;;   BREAST SURGERY     CARDIAC CATHETERIZATION     CHOLECYSTECTOMY     COLONOSCOPY N/A 08/14/2015   Procedure: COLONOSCOPY;  Surgeon: Alanda Allegra, MD;  Location: [facility];  Service: Gastroenterology;  Laterality: N/A;   ESOPHAGOGASTRODUODENOSCOPY (EGD) WITH PROPOFOL  N/A 05/08/2020   Procedure: ESOPHAGOGASTRODUODENOSCOPY (EGD) WITH PROPOFOL ;  Surgeon: Urban Garden, MD;  Location: [facility];  Service: Gastroenterology;  Laterality: N/A;   ESOPHAGOGASTRODUODENOSCOPY (EGD) WITH PROPOFOL  N/A 08/07/2020   Procedure: ESOPHAGOGASTRODUODENOSCOPY (EGD) WITH PROPOFOL ;  Surgeon: Urban Garden, MD;  Location: [facility];  Service: Gastroenterology;  Laterality: N/A;  7:30   IR IMAGING GUIDED PORT INSERTION  06/04/2020   ROBOTIC ASSISTED TOTAL HYSTERECTOMY WITH BILATERAL SALPINGO OOPHERECTOMY N/A  05/03/2020   Procedure: XI ROBOTIC ASSISTED TOTAL HYSTERECTOMY WITH BILATERAL SALPINGO OOPHORECTOMY, OMENTECTOMY,RADICAL TUMOR DEBULKING, RIGID PROSTOSCOPY;  Surgeon: Alphonso Aschoff, MD;  Location: [facility];  Service: Gynecology;  Laterality: N/A;   THROMBECTOMY FEMORAL ARTERY Right 12/17/2013   Procedure: REPAIR OF RIGHT FEMORAL ARTERY PSEUDOANEURYSM;  Surgeon: Harriet Limber  Fields, MD;  Location: [facility];  Service: Vascular;  Laterality: Right;     Allergies  Allergen Reactions   Morphine  And Codeine Nausea And Vomiting   Zofran  [Ondansetron ]     Causes minor constipation, tolerates low doses        Family History  Problem Relation Age of Onset   Hypertension Mother    Heart failure Mother    Asthma Mother    Ulcers Mother    Benign prostatic hyperplasia Father    Ulcers Father    Esophageal varices Father    Atrial fibrillation Sister    Atrial fibrillation Brother    Heart disease Brother    Melanoma Maternal Aunt    Colon cancer Neg Hx    Breast cancer Neg Hx    Ovarian cancer Neg Hx    Endometrial cancer Neg Hx    Pancreatic cancer Neg Hx    Prostate cancer Neg Hx      Social History Ms. Parfitt reports that she has never smoked. She has never used smokeless tobacco. Ms. Lupe reports no history of alcohol  use.     Physical Examination Today's Vitals   12/11/23 1131 12/11/23 1207  BP: (!) 158/80 (!) 148/82  Pulse: 70   SpO2: 98%   Weight: 173 lb 12.8 oz (78.8 kg)   Height: 4' 9 (1.448 m)    Body mass index is 37.61 kg/m.  Gen: resting comfortably, no acute distress HEENT: no scleral icterus, pupils equal round and reactive, no palptable cervical adenopathy,  CV: RRR, no mrg, no jvd Resp: Clear to auscultation bilaterally GI: abdomen is soft, non-tender, non-distended, normal bowel sounds, no hepatosplenomegaly MSK: extremities are warm, no edema.  Skin: warm, no rash Neuro:  no focal deficits Psych: appropriate affect   Diagnostic Studies  04/2018 echo    Study Conclusions   - Left ventricle: The cavity size was normal. Wall thickness was    increased in a pattern of moderate LVH. Systolic function was    normal. The estimated ejection fraction was in the range of 60%    to 65%. Wall motion was normal; there were no regional wall    motion abnormalities. Left ventricular diastolic function    parameters were normal.  - Mitral valve: Mildly thickened leaflets . There was mild    regurgitation.  - Left atrium: The atrium was mildly dilated.  - Right atrium: The atrium was at the upper limits of normal in    size.  - Inferior vena cava: The vessel was normal in size. The    respirophasic diameter changes were in the normal range (>= 50%),    consistent with normal central venous pressure.    Assessment and Plan   1.AFib/acquired thrombophilia - recent significant palpitations. Unclear if related to afib or ectopy noted on recent ER visit - plan for 7 day monitor assess heart rhythms and ectopy burden, assess HRs and if room to titrate av nodal agents   F/u 6 weeks   Laurann Pollock, M.D.

## 2023-12-11 NOTE — Patient Instructions (Addendum)
 Medication Instructions:   Stop Amlodipine  (Norvasc ) Continue all other medications.     Labwork:  none  Testing/Procedures:  Your physician has recommended that you wear a 7 day event monitor. Event monitors are medical devices that record the heart's electrical activity. Doctors most often us  these monitors to diagnose arrhythmias. Arrhythmias are problems with the speed or rhythm of the heartbeat. The monitor is a small, portable device. You can wear one while you do your normal daily activities. This is usually used to diagnose what is causing palpitations/syncope (passing out). Office will contact with results via phone, letter or mychart.     Follow-Up:  6 weeks   Any Other Special Instructions Will Be Listed Below (If Applicable).   If you need a refill on your cardiac medications before your next appointment, please call your pharmacy.

## 2023-12-12 DIAGNOSIS — R002 Palpitations: Secondary | ICD-10-CM | POA: Diagnosis not present

## 2023-12-14 ENCOUNTER — Telehealth: Payer: Self-pay | Admitting: Cardiology

## 2023-12-14 NOTE — Telephone Encounter (Signed)
 Patient called stating that she took a nap. When she work up the patch had fallen half off. She is now concerned that it is not reading properly. States that she has been calling [employer] but can't get an answer.

## 2023-12-14 NOTE — Telephone Encounter (Signed)
 Spoke with patient. She stated that she has done all the troubleshooting steps regarding the monitor and placing it back on. Advised her to call [employer]. She stated that she has, but no answer. She stated that she will continue trying. No other issues at this time

## 2023-12-18 DIAGNOSIS — E7849 Other hyperlipidemia: Secondary | ICD-10-CM | POA: Diagnosis not present

## 2023-12-18 DIAGNOSIS — E782 Mixed hyperlipidemia: Secondary | ICD-10-CM | POA: Diagnosis not present

## 2023-12-18 DIAGNOSIS — I10 Essential (primary) hypertension: Secondary | ICD-10-CM | POA: Diagnosis not present

## 2023-12-18 DIAGNOSIS — I482 Chronic atrial fibrillation, unspecified: Secondary | ICD-10-CM | POA: Diagnosis not present

## 2023-12-18 DIAGNOSIS — I493 Ventricular premature depolarization: Secondary | ICD-10-CM | POA: Diagnosis not present

## 2023-12-21 DIAGNOSIS — I10 Essential (primary) hypertension: Secondary | ICD-10-CM | POA: Diagnosis not present

## 2023-12-21 DIAGNOSIS — E782 Mixed hyperlipidemia: Secondary | ICD-10-CM | POA: Diagnosis not present

## 2023-12-21 DIAGNOSIS — I4891 Unspecified atrial fibrillation: Secondary | ICD-10-CM | POA: Diagnosis not present

## 2023-12-23 DIAGNOSIS — H35031 Hypertensive retinopathy, right eye: Secondary | ICD-10-CM | POA: Diagnosis not present

## 2023-12-24 ENCOUNTER — Other Ambulatory Visit: Payer: Self-pay

## 2023-12-24 ENCOUNTER — Telehealth: Payer: Self-pay | Admitting: Cardiology

## 2023-12-24 ENCOUNTER — Emergency Department (HOSPITAL_COMMUNITY)
Admission: EM | Admit: 2023-12-24 | Discharge: 2023-12-24 | Disposition: A | Discharge status: Home / Self Care | Attending: Emergency Medicine | Admitting: Emergency Medicine

## 2023-12-24 ENCOUNTER — Encounter (HOSPITAL_COMMUNITY): Payer: Self-pay

## 2023-12-24 ENCOUNTER — Emergency Department (HOSPITAL_COMMUNITY)

## 2023-12-24 DIAGNOSIS — Z79899 Other long term (current) drug therapy: Secondary | ICD-10-CM | POA: Diagnosis not present

## 2023-12-24 DIAGNOSIS — R002 Palpitations: Secondary | ICD-10-CM | POA: Diagnosis not present

## 2023-12-24 DIAGNOSIS — R079 Chest pain, unspecified: Secondary | ICD-10-CM | POA: Diagnosis not present

## 2023-12-24 DIAGNOSIS — I10 Essential (primary) hypertension: Secondary | ICD-10-CM | POA: Diagnosis not present

## 2023-12-24 DIAGNOSIS — I480 Paroxysmal atrial fibrillation: Secondary | ICD-10-CM | POA: Diagnosis not present

## 2023-12-24 DIAGNOSIS — Z7901 Long term (current) use of anticoagulants: Secondary | ICD-10-CM | POA: Insufficient documentation

## 2023-12-24 DIAGNOSIS — R918 Other nonspecific abnormal finding of lung field: Secondary | ICD-10-CM | POA: Diagnosis not present

## 2023-12-24 DIAGNOSIS — J45909 Unspecified asthma, uncomplicated: Secondary | ICD-10-CM | POA: Insufficient documentation

## 2023-12-24 DIAGNOSIS — I471 Supraventricular tachycardia, unspecified: Secondary | ICD-10-CM

## 2023-12-24 LAB — COMPREHENSIVE METABOLIC PANEL WITH GFR
ALT: 17 U/L (ref 0–44)
AST: 22 U/L (ref 15–41)
Albumin: 4.3 g/dL (ref 3.5–5.0)
Alkaline Phosphatase: 54 U/L (ref 38–126)
Anion gap: 11 (ref 5–15)
BUN: 13 mg/dL (ref 8–23)
CO2: 23 mmol/L (ref 22–32)
Calcium: 9.4 mg/dL (ref 8.9–10.3)
Chloride: 100 mmol/L (ref 98–111)
Creatinine, Ser: 0.62 mg/dL (ref 0.44–1.00)
GFR, Estimated: >60 mL/min (ref >60)
Glucose, Bld: 93 mg/dL (ref 70–99)
Potassium: 3.8 mmol/L (ref 3.5–5.1)
Sodium: 134 mmol/L — ABNORMAL LOW (ref 135–145)
Total Bilirubin: 0.7 mg/dL (ref 0.0–1.2)
Total Protein: 7.5 g/dL (ref 6.5–8.1)

## 2023-12-24 LAB — TROPONIN I (HIGH SENSITIVITY)
Troponin I (High Sensitivity): 5 ng/L (ref <18)
Troponin I (High Sensitivity): 5 ng/L (ref <18)

## 2023-12-24 LAB — CBC WITH DIFFERENTIAL/PLATELET
Abs Immature Granulocytes: 0.02 10*3/uL (ref 0.00–0.07)
Basophils Absolute: 0 10*3/uL (ref 0.0–0.1)
Basophils Relative: 0 %
Eosinophils Absolute: 0.1 10*3/uL (ref 0.0–0.5)
Eosinophils Relative: 1 %
HCT: 37.7 % (ref 36.0–46.0)
Hemoglobin: 12.9 g/dL (ref 12.0–15.0)
Immature Granulocytes: 0 %
Lymphocytes Relative: 23 %
Lymphs Abs: 1.8 10*3/uL (ref 0.7–4.0)
MCH: 32.9 pg (ref 26.0–34.0)
MCHC: 34.2 g/dL (ref 30.0–36.0)
MCV: 96.2 fL (ref 80.0–100.0)
Monocytes Absolute: 0.8 10*3/uL (ref 0.1–1.0)
Monocytes Relative: 10 %
Neutro Abs: 5 10*3/uL (ref 1.7–7.7)
Neutrophils Relative %: 66 %
Platelets: 323 10*3/uL (ref 150–400)
RBC: 3.92 MIL/uL (ref 3.87–5.11)
RDW: 12.7 % (ref 11.5–15.5)
WBC: 7.7 10*3/uL (ref 4.0–10.5)
nRBC: 0 % (ref 0.0–0.2)

## 2023-12-24 LAB — MAGNESIUM: Magnesium: 2.2 mg/dL (ref 1.7–2.4)

## 2023-12-24 MED ORDER — HEPARIN SOD (PORK) LOCK FLUSH 100 UNIT/ML IV SOLN
500.0000 [IU] | Freq: Once | INTRAVENOUS | Status: AC
  Administered 2023-12-24: 500 [IU]
  Filled 2023-12-24: qty 5

## 2023-12-24 NOTE — Telephone Encounter (Signed)
 Patient c/o Palpitations:  STAT if patient reporting lightheadedness, shortness of breath, or chest pain  How long have you had palpitations/irregular HR/ Afib? Are you having the symptoms now?   Yes  Are you currently experiencing lightheadedness, SOB or CP?   Lightheaded, SOB  Do you have a history of afib (atrial fibrillation) or irregular heart rhythm?   Yes  Have you checked your BP or HR? (document readings if available):   Not done today.  Yesterday 150/88  Are you experiencing any other symptoms? Fatigue, sweating at back of neck   Patient is concerned about having racing heart beats on and off for the last couple of days.  Patient wants advice on next steps.

## 2023-12-24 NOTE — Discharge Instructions (Signed)
 Your lab tests , EKG and monitoring here is reassuring.  Please follow up closely with Dr Alvan.  He does not have your [employer] results yet, but should soon and can better advise once he has this information.

## 2023-12-24 NOTE — ED Notes (Signed)
 ED Provider at bedside.

## 2023-12-24 NOTE — ED Notes (Signed)
 Pt states she did take her BP medication this morning.

## 2023-12-24 NOTE — ED Triage Notes (Signed)
 Pt arrived via POV c/o palpitations X 2 weeks. Pt reports recent visit to our facility, and [facility] and was started on Cardizem . Pt reports the palpitations are intermittent. Pt reports calling Dr Alvan this morning, but did not receive a follow-up call so Pt presents to [facility] for further evaluation.  Pt reports feeling lightheaded. Pt denies active CP.

## 2023-12-24 NOTE — Progress Notes (Signed)
   Progress Note  Patient Name: ALEXIAS MARGERUM Date of Encounter: 12/24/2023  Primary Cardiologist: Alvan Carrier, MD  Discussed patient by phone with Ms. Idol PA-C in the [facility] ED. Patient presented with continued complaints of intermittent palpitations, known history of PAF managed by Dr. Alvan.  Recent chart reviewed including medication adjustments.  She was most recently started on Cardizem  CD 120 mg daily in addition to Toprol -XL 100 mg daily.  She is on Eliquis  5 mg twice daily for stroke prophylaxis.  Also recently wore a 7-day Zio patch and just turned it in this past Friday.  Strips are not yet available for review.  Current ECGs show sinus rhythm with PACs.  Apparently having atrial ectopy to very limited degree in the ER, possibly some brief bursts of atrial fibrillation, but nothing prolonged.  Her resting heart rate is in the 60s.  My recommendation would be continue medical therapy and follow-up as an outpatient.  Need to get more information regarding her AF burden once recent Zio patch strips are available to review prior to making additional medication adjustments.  This may be a situation where referral to EP for further discussion of antiarrhythmic therapy is in order given her symptoms.  Signed, Jayson Sierras, MD  12/24/2023, 3:00 PM

## 2023-12-24 NOTE — Telephone Encounter (Signed)
Patient presented to [facility] at 11:42 this am & d/c'd at 3:00 pm.  Will forward to Dr. Alvan to see if he wants earlier follow up than 02/03/2024.

## 2023-12-24 NOTE — ED Notes (Addendum)
 EDP made aware pt currently in SR, repeat EKG

## 2023-12-24 NOTE — ED Notes (Signed)
 Placing in FT room until further notice due to pt needing to be on cardiac monitor because she is hypertensive and symptomatic of AFIB. Charge RN aware.

## 2023-12-24 NOTE — ED Provider Notes (Signed)
 [place] EMERGENCY DEPARTMENT AT [facility] Provider Note   CSN: [id number] Arrival date & time: 12/24/23  1142     Patient presents with: Palpitations   April Guerra is a 78 y.o. female with a history most significant for paroxysmal atrial fibrillation, asthma, hypertension who is under the care of Dr. Alvan for her atrial fibrillation, has had exacerbation in the symptoms, describing increasing episodes of palpitations over the past [redacted] weeks along with shortness of breath which is typically with exerting but resolved as long as she is at rest.  She was seen for the same complaint at [facility] mid June at which time her workup was reassuring but in follow-up care with Dr. Alvan he had her wear an event monitor for 1 week which she does not have the results of yet.  She has also recently been started on Cardizem  CD in place of her prn Cardizem  for symptom relief and is also on toprol  1oo mg every day.  She feels her symptoms are worse rather than better.  She denies chest pain, pleuritic symptoms, orthopnea.  No peripheral dema.   The history is provided by the patient.       Prior to Admission medications   Medication Sig Start Date End Date Taking? Authorizing Provider  acetaminophen  (TYLENOL ) 500 MG tablet Take 1,000 mg by mouth 3 (three) times daily.    [provider]  albuterol  (VENTOLIN  HFA) 108 (90 Base) MCG/ACT inhaler Inhale 2 puffs into the lungs every 4 (four) hours as needed for wheezing or shortness of breath. 05/09/20   Pearlean Manus, MD  apixaban  (ELIQUIS ) 5 MG TABS tablet Take 1 tablet (5 mg total) by mouth 2 (two) times daily. 07/13/23   Alvan Dorn FALCON, MD  Calcium  Carb-Cholecalciferol  (CALCIUM  600/VITAMIN D PO) Take 600 mg by mouth in the morning and at bedtime.    [provider]  cetirizine (ZYRTEC) 10 MG tablet Take 10 mg by mouth daily.    [provider]  Cholecalciferol  (VITAMIN D) 125 MCG (5000 UT) CAPS Take 5,000  Units by mouth daily.    [provider]  Coenzyme Q10 (COQ10) 100 MG CAPS Take 100 mg by mouth at bedtime.     [provider]  cyclobenzaprine  (FLEXERIL ) 10 MG tablet Take 5 mg by mouth See admin instructions. Take 5 mg at night, may take a second 5 mg dose during the day as needed for muscle spasms 04/22/19   [provider]  diltiazem  (CARDIZEM  CD) 120 MG 24 hr capsule Take 1 capsule (120 mg total) by mouth daily. 12/09/23   Armenta Canning, MD  diltiazem  (CARDIZEM ) 30 MG tablet Take 1 tablet every 4 hours AS NEEDED for AFIB heart rate >100 06/12/23   Alvan Dorn FALCON, MD  famotidine  (PEPCID ) 40 MG tablet Take 40 mg by mouth daily. 03/17/23   [provider]  fluticasone  (FLONASE ) 50 MCG/ACT nasal spray Place 1 spray into both nostrils daily. 11/11/19   [provider]  furosemide  (LASIX ) 20 MG tablet TAKE 1 TABLET BY MOUTH DAILY AS  NEEDED FOR SWELLING 06/02/22   Alvan Dorn FALCON, MD  gabapentin  (NEURONTIN ) 300 MG capsule Take 300 mg by mouth daily.    [provider]  lisinopril  (ZESTRIL ) 20 MG tablet Take 20 mg by mouth daily. 02/24/23   [provider]  Magnesium  100 MG TABS Take 50 mg by mouth at bedtime.    [provider]  melatonin 5 MG TABS Take  2.5 mg by mouth at bedtime as needed (Sleep).    [provider]  metoprolol  succinate (TOPROL -XL) 100 MG 24 hr tablet Take 1 tablet (100 mg total) by mouth in the morning. 11/03/22   Alvan Dorn FALCON, MD  Multiple Vitamins-Minerals (CENTRUM SILVER  ADULT 50+ PO) Take 1 tablet by mouth daily.    [provider]  Omega-3 Fatty Acids (FISH OIL) 1000 MG CAPS Take 1,000 mg by mouth daily.    [provider]  oseltamivir (TAMIFLU) 75 MG capsule Take 75 mg by mouth daily. 09/08/23   [provider]  pantoprazole  (PROTONIX ) 40 MG tablet Take 1 tablet (40 mg total) by mouth daily. 08/07/20 01/23/79  [name], MD  polyethylene glycol  powder (GLYCOLAX/MIRALAX) 17 GM/SCOOP powder Take 17 g by mouth daily as needed for mild constipation or moderate constipation (With lunch).    [provider]  Polyvinyl Alcohol -Povidone PF (REFRESH) 1.4-0.6 % SOLN Apply 1 drop to eye as needed.    [provider]  simvastatin  (ZOCOR ) 20 MG tablet Take 20 mg by mouth at bedtime. 05/28/21   [provider]  sodium chloride  (OCEAN) 0.65 % SOLN nasal spray Place 1 spray into both nostrils See admin instructions. Use 1 spray in each nostril in the morning may use a second dose at night as needed for congestion    [provider]  triamcinolone  (KENALOG ) 0.1 % Apply 1 application topically daily as needed (irritation). 05/22/20   [provider]  vitamin C  (ASCORBIC ACID ) 500 MG tablet Take 1,000 mg by mouth 2 (two) times daily.    [provider]  Vitamins A & D (VITAMIN A & D) ointment Apply 1 application topically daily as needed for dry skin (irritation).    [provider]  prochlorperazine  (COMPAZINE ) 10 MG tablet Take 1 tablet (10 mg total) by mouth every 6 (six) hours as needed (Nausea or vomiting). 05/29/20 12/14/21  Gudena, Vinay, MD    Allergies: Morphine  and codeine and Zofran  [ondansetron ]    Review of Systems  Constitutional:  Negative for fever.  HENT:  Negative for congestion and sore throat.   Eyes: Negative.   Respiratory:  Negative for chest tightness and shortness of breath.   Cardiovascular:  Positive for palpitations. Negative for chest pain.  Gastrointestinal:  Negative for abdominal pain and nausea.  Genitourinary: Negative.   Musculoskeletal:  Negative for arthralgias, joint swelling and neck pain.  Skin: Negative.  Negative for rash and wound.  Neurological:  Negative for dizziness, weakness, light-headedness, numbness and headaches.  Psychiatric/Behavioral: Negative.      Updated Vital Signs BP (!) 179/80   Pulse (!) 56   Temp (!) 97.4 F (36.3 C)  (Temporal)   Resp 15   Ht 4' 9 (1.448 m)   Wt 78.5 kg   SpO2 98%   BMI 37.44 kg/m   Physical Exam Vitals and nursing note reviewed.  Constitutional:      Appearance: She is well-developed.  HENT:     Head: Normocephalic and atraumatic.  Eyes:     Conjunctiva/sclera: Conjunctivae normal.  Cardiovascular:     Rate and Rhythm: Normal rate and regular rhythm.     Heart sounds: Normal heart sounds.  Pulmonary:     Effort: Pulmonary effort is normal.     Breath sounds: Normal breath sounds. No wheezing.  Abdominal:     General: Bowel sounds are normal.     Palpations: Abdomen is soft.     Tenderness:  There is no abdominal tenderness.  Musculoskeletal:        General: Normal range of motion.     Cervical back: Normal range of motion.  Skin:    General: Skin is warm and dry.  Neurological:     General: No focal deficit present.     Mental Status: She is alert.     (all labs ordered are listed, but only abnormal results are displayed) Labs Reviewed  COMPREHENSIVE METABOLIC PANEL WITH GFR - Abnormal; Notable for the following components:      Result Value   Sodium 134 (*)    All other components within normal limits  CBC WITH DIFFERENTIAL/PLATELET  MAGNESIUM   TROPONIN I (HIGH SENSITIVITY)  TROPONIN I (HIGH SENSITIVITY)    EKG: EKG Interpretation Date/Time:  Thursday December 24 2023 11:59:34 EDT Ventricular Rate:  81 PR Interval:  164 QRS Duration:  94 QT Interval:  401 QTC Calculation: 404 R Axis:   43  Text Interpretation: Sinus rhythm Supraventricular bigeminy Consider left atrial enlargement Anteroseptal infarct, old Minimal ST depression, inferior leads No significant change since last tracing Confirmed by Dean Clarity [phone]) on 12/24/2023 12:55:31 PM  Radiology: No results found.    Procedures   Medications Ordered in the ED  heparin  lock flush 100 unit/mL (500 Units Intracatheter Given 12/24/23 1541)                                    Medical  Decision Making Patient presenting with persistent intermittent palpitations consistent with her paroxysmal A-fib despite recently starting long-acting Cardizem  who is also been compliant with her Toprol .  Under the care of Dr. Alvan.  She recently wore an event monitor and is pending these results.  She has known atrial fibrillation, labs repeated today to rule out electrolyte abnormalities and all appear stable today.  Discussed patient's concerns with Dr. Debera on-call for cardiology today.  Her ZIO monitor has just been received at the company but unfortunately no reading at this time.  He recommends no changes in her medications at this time but close follow-up with Dr. Alvan in his office early next week at which time her monitor should have resulted.  She may need to be considered for the A-fib clinic and consider antiarrhythmic therapy if her current regimen is not controlling her symptoms.  I reviewed her monitor while she was in the department, she did have several episodes of nonsustained A-fib lasting from 20 seconds up to several minutes.  Amount and/or Complexity of Data Reviewed Labs: ordered.    Details: Labs negative, specifically electrolytes, including potassium and magnesium .  Her delta troponins are negative. Radiology: ordered.    Details: X-ray reviewed, right perihilar density most likely confluence of hilar vasculature.  Less likely hilar mass which I doubt as this was not there on June 18. ECG/medicine tests: ordered.    Details: Sinus rhythm rate 81, bigeminy.  Risk Prescription drug management.        Final diagnoses:  Palpitations  Paroxysmal atrial fibrillation [facility])    ED Discharge Orders     None          Birdena Clarity RIGGERS 12/26/23 2051    Dean Clarity, MD 12/27/23 [phone]

## 2023-12-28 DIAGNOSIS — M1712 Unilateral primary osteoarthritis, left knee: Secondary | ICD-10-CM | POA: Diagnosis not present

## 2023-12-28 DIAGNOSIS — M25562 Pain in left knee: Secondary | ICD-10-CM | POA: Diagnosis not present

## 2023-12-28 DIAGNOSIS — G8929 Other chronic pain: Secondary | ICD-10-CM | POA: Diagnosis not present

## 2023-12-29 ENCOUNTER — Telehealth: Payer: Self-pay | Admitting: Cardiology

## 2023-12-29 NOTE — Telephone Encounter (Signed)
 Pt would like a c/b from the nurse to discuss her hospital visit and would like to know if she should be seen sooner. Please advise

## 2023-12-29 NOTE — Telephone Encounter (Signed)
 Advised patient that we need to get Zio monitor report back first.  Per Zio site - monitor is still in processing phase.  Reviewed Dr. Madalyn recommendations as stated below.  Will await final report & Dr. Alvan review for his recommendations.  Keep appointment as scheduled for August.

## 2023-12-29 NOTE — Telephone Encounter (Signed)
 Primary Cardiologist: Alvan Carrier, MD   Discussed patient by phone with Ms. Idol PA-C in the [facility] ED. Patient presented with continued complaints of intermittent palpitations, known history of PAF managed by Dr. Alvan.  Recent chart reviewed including medication adjustments.  She was most recently started on Cardizem  CD 120 mg daily in addition to Toprol -XL 100 mg daily.  She is on Eliquis  5 mg twice daily for stroke prophylaxis.  Also recently wore a 7-day Zio patch and just turned it in this past Friday.  Strips are not yet available for review.  Current ECGs show sinus rhythm with PACs.  Apparently having atrial ectopy to very limited degree in the ER, possibly some brief bursts of atrial fibrillation, but nothing prolonged.  Her resting heart rate is in the 60s.  My recommendation would be continue medical therapy and follow-up as an outpatient.  Need to get more information regarding her AF burden once recent Zio patch strips are available to review prior to making additional medication adjustments.  This may be a situation where referral to EP for further discussion of antiarrhythmic therapy is in order given her symptoms.   Signed, Jayson Sierras, MD  12/24/2023, 3:00 PM

## 2023-12-31 NOTE — Telephone Encounter (Signed)
 Just completed - should be in your studies to read now.

## 2024-01-02 DIAGNOSIS — M7122 Synovial cyst of popliteal space [Baker], left knee: Secondary | ICD-10-CM | POA: Diagnosis not present

## 2024-01-02 DIAGNOSIS — M1712 Unilateral primary osteoarthritis, left knee: Secondary | ICD-10-CM | POA: Diagnosis not present

## 2024-01-02 DIAGNOSIS — M23204 Derangement of unspecified medial meniscus due to old tear or injury, left knee: Secondary | ICD-10-CM | POA: Diagnosis not present

## 2024-01-02 DIAGNOSIS — M25562 Pain in left knee: Secondary | ICD-10-CM | POA: Diagnosis not present

## 2024-01-07 ENCOUNTER — Ambulatory Visit: Payer: Self-pay | Admitting: Cardiology

## 2024-01-07 DIAGNOSIS — R051 Acute cough: Secondary | ICD-10-CM | POA: Diagnosis not present

## 2024-01-07 DIAGNOSIS — R5382 Chronic fatigue, unspecified: Secondary | ICD-10-CM | POA: Diagnosis not present

## 2024-01-07 DIAGNOSIS — I10 Essential (primary) hypertension: Secondary | ICD-10-CM | POA: Diagnosis not present

## 2024-01-07 DIAGNOSIS — R002 Palpitations: Secondary | ICD-10-CM

## 2024-01-07 DIAGNOSIS — I482 Chronic atrial fibrillation, unspecified: Secondary | ICD-10-CM | POA: Diagnosis not present

## 2024-01-07 NOTE — Telephone Encounter (Signed)
 Left message to return call

## 2024-01-08 ENCOUNTER — Telehealth: Payer: Self-pay | Admitting: Cardiology

## 2024-01-08 NOTE — Telephone Encounter (Signed)
 Pt returning nurse call in regards to results. Please advise

## 2024-01-08 NOTE — Telephone Encounter (Signed)
-----   Message from Alvan Carrier sent at 01/07/2024  3:37 PM EDT ----- Monitor shows frequent runs of a heart rhythm called SVT which is not dangerous but can cause the heart to race and cause symptoms. Can we increase her ditliazem to 180mg  daily and refer her to EP  please for SVT  JINNY Alvan MD ----- Message ----- From: Alvan Carrier FALCON, MD Sent: 01/07/2024   3:34 PM EDT To: Carrier FALCON Alvan, MD

## 2024-01-08 NOTE — Telephone Encounter (Signed)
Ptcalling again in regards to results?

## 2024-01-08 NOTE — Telephone Encounter (Signed)
Pt returning nurses call regarding test results. Please advise

## 2024-01-11 NOTE — Telephone Encounter (Signed)
 Her ER tablets are the 24hr tablets. The IR are q 6 hours which makes making 180mg  very difficult and she would have to take several times a day.

## 2024-01-11 NOTE — Telephone Encounter (Signed)
 01/08/24  5:14 PM Result Note Notified, copy to pcp.

## 2024-01-11 NOTE — Telephone Encounter (Signed)
-----   Message from Alvan Carrier sent at 01/07/2024  3:37 PM EDT ----- Monitor shows frequent runs of a heart rhythm called SVT which is not dangerous but can cause the heart to race and cause symptoms. Can we increase her ditliazem to 180mg  daily and refer her to EP  please for SVT   JINNY Alvan MD

## 2024-01-11 NOTE — Telephone Encounter (Signed)
 Will send message to pharmD to check on her medication.  She was previously on Diltiazem  CD 120mg  daily.  Provider wants her to increase to 180mg  daily.  States that she just filled her 120mg  with # 100 tabs.  Also states that she has some 30mg  & 60mg  short acting tabs.  Is it safe for her to mix the short & long acting to get the dose she needs so she won't waste the 120mg  tabs that she just purchased.  Explained to patient that the short acting tabs are 12 hours & the CD is long acting (24 hrs).

## 2024-01-11 NOTE — Telephone Encounter (Signed)
Addressed in previous phone note.  

## 2024-01-12 ENCOUNTER — Telehealth: Payer: Self-pay | Admitting: Cardiology

## 2024-01-12 NOTE — Telephone Encounter (Signed)
 Follow Up:        Patient said she talked to a nurse on Friday. She says she is waiting to hear, what was decided about the medicine.

## 2024-01-13 MED ORDER — DILTIAZEM HCL ER COATED BEADS 180 MG PO CP24: 180.0000 mg | ORAL_CAPSULE | Freq: Every day | ORAL | 1 refills | Status: DC

## 2024-01-13 NOTE — Telephone Encounter (Signed)
 Follow Up:       Patient is calling again today. She says she is still waiting to hear something.

## 2024-01-13 NOTE — Telephone Encounter (Signed)
 See previous phone encounter for details.

## 2024-01-13 NOTE — Telephone Encounter (Signed)
 Patient informed and verbalized understanding of plan. Diltiazem  180 mg daily sent to [facility]. EP referral placed. Note-Reports over the weekend she did take diltiazem  30 mg in the morning, 2 hours later took diltiazem  120 mg and in the evening, diltiazem  30 mg. Reports she did well with this regimen.  Request that Dr. Alvan is made aware. Sent to provider for review.

## 2024-01-14 ENCOUNTER — Telehealth: Payer: Self-pay

## 2024-01-14 MED ORDER — ROSUVASTATIN CALCIUM 5 MG PO TABS: 5.0000 mg | ORAL_TABLET | Freq: Every day | ORAL | 6 refills | Status: DC

## 2024-01-14 NOTE — Telephone Encounter (Signed)
 Can we stop her simvastatin  and start crestor  5mg  daily  JINNY Ross MD

## 2024-01-14 NOTE — Telephone Encounter (Signed)
 [facility] mail order pharmacy requesting a clarification between medications diltiazem  180 mg and simvastatin  20 mg. Pharmacy wanted to know if Dr. Alvan is aware of FDA guidelines recommend not exceeding doses of simvastatin  above 10 mg daily when taking this combination. Is Dr. Alvan aware and does he still want to prescribe this medication? Please address    Ph# [phone]  order# [id number]

## 2024-01-14 NOTE — Addendum Note (Signed)
 Addended by: Phuoc Huy G on: 01/14/2024 05:21 PM   Modules accepted: Orders

## 2024-01-14 NOTE — Telephone Encounter (Signed)
Patient notified and verbalized understanding.  Medication sent to [facility] now.

## 2024-01-19 NOTE — Telephone Encounter (Signed)
[facility] notified.

## 2024-01-21 DIAGNOSIS — I4891 Unspecified atrial fibrillation: Secondary | ICD-10-CM | POA: Diagnosis not present

## 2024-01-21 DIAGNOSIS — I10 Essential (primary) hypertension: Secondary | ICD-10-CM | POA: Diagnosis not present

## 2024-01-21 DIAGNOSIS — E782 Mixed hyperlipidemia: Secondary | ICD-10-CM | POA: Diagnosis not present

## 2024-01-22 ENCOUNTER — Inpatient Hospital Stay (HOSPITAL_COMMUNITY)
Admission: EM | Admit: 2024-01-22 | Discharge: 2024-01-24 | DRG: 309 | Disposition: A | Discharge status: Home / Self Care | Attending: Internal Medicine | Admitting: Internal Medicine

## 2024-01-22 ENCOUNTER — Emergency Department (HOSPITAL_COMMUNITY)

## 2024-01-22 ENCOUNTER — Encounter (HOSPITAL_COMMUNITY): Payer: Self-pay | Admitting: Emergency Medicine

## 2024-01-22 ENCOUNTER — Other Ambulatory Visit: Payer: Self-pay

## 2024-01-22 DIAGNOSIS — Z8249 Family history of ischemic heart disease and other diseases of the circulatory system: Secondary | ICD-10-CM

## 2024-01-22 DIAGNOSIS — E7800 Pure hypercholesterolemia, unspecified: Secondary | ICD-10-CM | POA: Diagnosis present

## 2024-01-22 DIAGNOSIS — I4891 Unspecified atrial fibrillation: Secondary | ICD-10-CM | POA: Diagnosis not present

## 2024-01-22 DIAGNOSIS — E785 Hyperlipidemia, unspecified: Secondary | ICD-10-CM | POA: Diagnosis not present

## 2024-01-22 DIAGNOSIS — Z6837 Body mass index (BMI) 37.0-37.9, adult: Secondary | ICD-10-CM

## 2024-01-22 DIAGNOSIS — Z79899 Other long term (current) drug therapy: Secondary | ICD-10-CM | POA: Diagnosis not present

## 2024-01-22 DIAGNOSIS — I471 Supraventricular tachycardia, unspecified: Secondary | ICD-10-CM | POA: Diagnosis not present

## 2024-01-22 DIAGNOSIS — Z825 Family history of asthma and other chronic lower respiratory diseases: Secondary | ICD-10-CM | POA: Diagnosis not present

## 2024-01-22 DIAGNOSIS — Z7901 Long term (current) use of anticoagulants: Secondary | ICD-10-CM

## 2024-01-22 DIAGNOSIS — I480 Paroxysmal atrial fibrillation: Principal | ICD-10-CM | POA: Diagnosis present

## 2024-01-22 DIAGNOSIS — I10 Essential (primary) hypertension: Secondary | ICD-10-CM | POA: Diagnosis present

## 2024-01-22 DIAGNOSIS — Z8543 Personal history of malignant neoplasm of ovary: Secondary | ICD-10-CM

## 2024-01-22 DIAGNOSIS — E669 Obesity, unspecified: Secondary | ICD-10-CM | POA: Diagnosis present

## 2024-01-22 DIAGNOSIS — D6869 Other thrombophilia: Secondary | ICD-10-CM | POA: Diagnosis present

## 2024-01-22 DIAGNOSIS — Z888 Allergy status to other drugs, medicaments and biological substances status: Secondary | ICD-10-CM | POA: Diagnosis not present

## 2024-01-22 DIAGNOSIS — E871 Hypo-osmolality and hyponatremia: Secondary | ICD-10-CM | POA: Diagnosis present

## 2024-01-22 DIAGNOSIS — Z885 Allergy status to narcotic agent status: Secondary | ICD-10-CM

## 2024-01-22 DIAGNOSIS — J45909 Unspecified asthma, uncomplicated: Secondary | ICD-10-CM | POA: Diagnosis present

## 2024-01-22 DIAGNOSIS — R0602 Shortness of breath: Secondary | ICD-10-CM | POA: Diagnosis not present

## 2024-01-22 DIAGNOSIS — I495 Sick sinus syndrome: Secondary | ICD-10-CM | POA: Diagnosis not present

## 2024-01-22 DIAGNOSIS — Z808 Family history of malignant neoplasm of other organs or systems: Secondary | ICD-10-CM

## 2024-01-22 LAB — BASIC METABOLIC PANEL WITH GFR
Anion gap: 13 (ref 5–15)
BUN: 17 mg/dL (ref 8–23)
CO2: 20 mmol/L — ABNORMAL LOW (ref 22–32)
Calcium: 9.7 mg/dL (ref 8.9–10.3)
Chloride: 97 mmol/L — ABNORMAL LOW (ref 98–111)
Creatinine, Ser: 0.82 mg/dL (ref 0.44–1.00)
GFR, Estimated: >60 mL/min (ref >60)
Glucose, Bld: 99 mg/dL (ref 70–99)
Potassium: 4.7 mmol/L (ref 3.5–5.1)
Sodium: 130 mmol/L — ABNORMAL LOW (ref 135–145)

## 2024-01-22 LAB — CBC
HCT: 43.8 % (ref 36.0–46.0)
Hemoglobin: 14.5 g/dL (ref 12.0–15.0)
MCH: 32.4 pg (ref 26.0–34.0)
MCHC: 33.1 g/dL (ref 30.0–36.0)
MCV: 98 fL (ref 80.0–100.0)
Platelets: 329 K/uL (ref 150–400)
RBC: 4.47 MIL/uL (ref 3.87–5.11)
RDW: 12.9 % (ref 11.5–15.5)
WBC: 9.3 K/uL (ref 4.0–10.5)
nRBC: 0 % (ref 0.0–0.2)

## 2024-01-22 LAB — PROTIME-INR
INR: 1.1 (ref 0.8–1.2)
Prothrombin Time: 14.7 s (ref 11.4–15.2)

## 2024-01-22 LAB — TROPONIN I (HIGH SENSITIVITY)
Troponin I (High Sensitivity): 16 ng/L (ref <18)
Troponin I (High Sensitivity): 14 ng/L (ref <18)

## 2024-01-22 MED ORDER — DILTIAZEM HCL 25 MG/5ML IV SOLN
INTRAVENOUS | Status: AC
  Administered 2024-01-22: 10 mg via INTRAVENOUS
  Filled 2024-01-22: qty 5

## 2024-01-22 MED ORDER — ETOMIDATE 2 MG/ML IV SOLN
10.0000 mg | Freq: Once | INTRAVENOUS | Status: AC
  Administered 2024-01-22: 10 mg via INTRAVENOUS
  Filled 2024-01-22: qty 10

## 2024-01-22 MED ORDER — DILTIAZEM HCL 25 MG/5ML IV SOLN: 10.0000 mg | Freq: Once | INTRAVENOUS | Status: DC

## 2024-01-22 MED ORDER — DILTIAZEM HCL-DEXTROSE 125-5 MG/125ML-% IV SOLN (PREMIX): 5.0000 mg/h | INTRAVENOUS | Status: DC

## 2024-01-22 MED ORDER — DILTIAZEM HCL 25 MG/5ML IV SOLN: 10.0000 mg | Freq: Once | INTRAVENOUS | Status: AC

## 2024-01-22 NOTE — H&P (Signed)
 Cardiology Admission History and Physical   Patient ID: April Guerra MRN: [id number]; DOB: [birth year]   Admission date: 01/22/2024  PCP:  Atilano Deward ORN, MD   [facility] Providers Cardiologist:  April Carrier, MD       Chief Complaint:  afib RVR  Patient Profile: April Guerra is a 78 y.o. female with paroxysmal Afib s/p DCCV 2019, 2024, HTN< HLD, Obesity, LE edema who is being seen 01/22/2024 for the evaluation of atrial fibrillation complicated by tachybradycardia syndrome after cardioversion x 2 in the ER .  History of Present Illness: April Guerra is a 78 y.o. female with paroxysmal Afib s/p DCCV 2019, 2024, HTN< HLD, Obesity, LE edema who is being seen 01/22/2024 for the evaluation of atrial fibrillation complicated by tachybradycardia syndrome after cardioversion x 2 in the ER  Patient presenting with palpitations going on for few weeks, on and off symptoms, multiple ER visit and doctors phone call for A-fib. also noted to have SVT on heart monitor, started taking increased dose of Cardizem  recently to 180 mg daily, feels very lightheaded and short of breath, heart rate noted to be 140 in triage, blood pressure stable.  She denies any chest pain or exertional dyspnea on exertion chest tightness.  She is very active for her age.  Daughter accompanied in the room reports-since last few weeks her A-fib has really slowed her  Initial labs show hyponatremia but otherwise unremarkable, troponin first is negative, patient compliant with Eliquis . EKGShows A-fib with RVR, heart rate 144 bpm, nonspecific ST-T wave changes. Status post cardioversion patient was in normal sinus rhythm with heart rate 62 bpm on EKG, however went back into A-fib with heart rate of 140s, subsequently with pause and again sudden bradycardia down to like 30s and 40s per ER notes and telemetry.  Cardiology is consulted for tachybradycardia syndrome and admission.  I reviewed the telemetry monitor, she  went from A-fib having to pause which is conversion pause and then into sinus bradycardia, no AV block, no significant bradycardia or significant pauses.  And then again she was back in A-fib and then sinus bradycardia.  Recently she had a heart monitor which showed SVT, sees Dr. Alvan, the same diltiazem  was increased to 80 mg and referred for to EP for SVT with heart rate of 214 bpm, burden is 6% .  August 2019 EF 60 to 65%, mild MR, mild left atrial enlargement    Past Medical History:  Diagnosis Date   A-fib (HCC)    Anxiety    Arthritis    Asthma    hx of    Cancer (HCC)    ovarian   Dyspnea    with exertion    H/O seasonal allergies    Heart murmur    as aa child    Hypercholesteremia    Hypertension    Spinal stenosis    Past Surgical History:  Procedure Laterality Date   ABDOMINAL HYSTERECTOMY     BIOPSY  05/08/2020   Procedure: BIOPSY;  Surgeon: April Angelia Sieving, MD;  Location: [facility];  Service: Gastroenterology;;   BREAST SURGERY     CARDIAC CATHETERIZATION     CHOLECYSTECTOMY     COLONOSCOPY N/A 08/14/2015   Procedure: COLONOSCOPY;  Surgeon: April Budge, MD;  Location: [facility];  Service: Gastroenterology;  Laterality: N/A;   ESOPHAGOGASTRODUODENOSCOPY (EGD) WITH PROPOFOL  N/A 05/08/2020   Procedure: ESOPHAGOGASTRODUODENOSCOPY (EGD) WITH PROPOFOL ;  Surgeon: April Angelia Sieving, MD;  Location: [facility];  Service: Gastroenterology;  Laterality: N/A;   ESOPHAGOGASTRODUODENOSCOPY (EGD) WITH PROPOFOL  N/A 08/07/2020   Procedure: ESOPHAGOGASTRODUODENOSCOPY (EGD) WITH PROPOFOL ;  Surgeon: April Angelia Sieving, MD;  Location: [facility];  Service: Gastroenterology;  Laterality: N/A;  7:30   IR IMAGING GUIDED PORT INSERTION  06/04/2020   ROBOTIC ASSISTED TOTAL HYSTERECTOMY WITH BILATERAL SALPINGO OOPHERECTOMY N/A 05/03/2020   Procedure: XI ROBOTIC ASSISTED TOTAL HYSTERECTOMY WITH BILATERAL SALPINGO OOPHORECTOMY, OMENTECTOMY,RADICAL  TUMOR DEBULKING, RIGID PROSTOSCOPY;  Surgeon: April Herring, MD;  Location: [facility];  Service: Gynecology;  Laterality: N/A;   THROMBECTOMY FEMORAL ARTERY Right 12/17/2013   Procedure: REPAIR OF RIGHT FEMORAL ARTERY PSEUDOANEURYSM;  Surgeon: April FORBES Haddock, MD;  Location: [facility];  Service: Vascular;  Laterality: Right;     Medications Prior to Admission: Prior to Admission medications   Medication Sig Start Date End Date Taking? Authorizing Provider  acetaminophen  (TYLENOL ) 500 MG tablet Take 1,000 mg by mouth 3 (three) times daily.    [provider]  albuterol  (VENTOLIN  HFA) 108 (90 Base) MCG/ACT inhaler Inhale 2 puffs into the lungs every 4 (four) hours as needed for wheezing or shortness of breath. 05/09/20   April Manus, MD  apixaban  (ELIQUIS ) 5 MG TABS tablet Take 1 tablet (5 mg total) by mouth 2 (two) times daily. 07/13/23   April Dorn FALCON, MD  Calcium  Carb-Cholecalciferol  (CALCIUM  600/VITAMIN D PO) Take 600 mg by mouth in the morning and at bedtime.    [provider]  cetirizine (ZYRTEC) 10 MG tablet Take 10 mg by mouth daily.    [provider]  Cholecalciferol  (VITAMIN D) 125 MCG (5000 UT) CAPS Take 5,000 Units by mouth daily.    [provider]  Coenzyme Q10 (COQ10) 100 MG CAPS Take 100 mg by mouth at bedtime.     [provider]  cyclobenzaprine  (FLEXERIL ) 10 MG tablet Take 5 mg by mouth See admin instructions. Take 5 mg at night, may take a second 5 mg dose during the day as needed for muscle spasms 04/22/19   [provider]  diltiazem  (CARDIZEM  CD) 180 MG 24 hr capsule Take 1 capsule (180 mg total) by mouth daily. 01/13/24   April Dorn FALCON, MD  diltiazem  (CARDIZEM ) 30 MG tablet Take 1 tablet every 4 hours AS NEEDED for AFIB heart rate >100 06/12/23   April Dorn FALCON, MD  famotidine  (PEPCID ) 40 MG tablet Take 40 mg by mouth daily. 03/17/23   [provider]  fluticasone  (FLONASE ) 50 MCG/ACT nasal spray Place 1  spray into both nostrils daily. 11/11/19   [provider]  furosemide  (LASIX ) 20 MG tablet TAKE 1 TABLET BY MOUTH DAILY AS  NEEDED FOR SWELLING 06/02/22   April Dorn FALCON, MD  gabapentin  (NEURONTIN ) 300 MG capsule Take 300 mg by mouth daily.    [provider]  lisinopril  (ZESTRIL ) 20 MG tablet Take 20 mg by mouth daily. 02/24/23   [provider]  Magnesium  100 MG TABS Take 50 mg by mouth at bedtime.    [provider]  melatonin 5 MG TABS Take 2.5 mg by mouth at bedtime as needed (Sleep).    [provider]  metoprolol  succinate (TOPROL -XL) 100 MG 24 hr tablet Take 1 tablet (100 mg total) by mouth in the morning. 11/03/22   April Dorn FALCON, MD  Multiple Vitamins-Minerals (CENTRUM SILVER  ADULT 50+ PO) Take 1 tablet by mouth daily.    [provider]  Omega-3 Fatty Acids (FISH OIL) 1000 MG CAPS Take  1,000 mg by mouth daily.    [provider]  oseltamivir (TAMIFLU) 75 MG capsule Take 75 mg by mouth daily. 09/08/23   [provider]  pantoprazole  (PROTONIX ) 40 MG tablet Take 1 tablet (40 mg total) by mouth daily. 08/07/20 01/23/79  April Angelia Sieving, MD  polyethylene glycol powder (GLYCOLAX/MIRALAX) 17 GM/SCOOP powder Take 17 g by mouth daily as needed for mild constipation or moderate constipation (With lunch).    [provider]  Polyvinyl Alcohol -Povidone PF (REFRESH) 1.4-0.6 % SOLN Apply 1 drop to eye as needed.    [provider]  rosuvastatin  (CRESTOR ) 5 MG tablet Take 1 tablet (5 mg total) by mouth daily. 01/14/24   April Dorn FALCON, MD  sodium chloride  (OCEAN) 0.65 % SOLN nasal spray Place 1 spray into both nostrils See admin instructions. Use 1 spray in each nostril in the morning may use a second dose at night as needed for congestion    [provider]  triamcinolone  (KENALOG ) 0.1 % Apply 1 application topically daily as needed (irritation). 05/22/20   [provider]  vitamin  C (ASCORBIC ACID ) 500 MG tablet Take 1,000 mg by mouth 2 (two) times daily.    [provider]  Vitamins A & D (VITAMIN A & D) ointment Apply 1 application topically daily as needed for dry skin (irritation).    [provider]  prochlorperazine  (COMPAZINE ) 10 MG tablet Take 1 tablet (10 mg total) by mouth every 6 (six) hours as needed (Nausea or vomiting). 05/29/20 12/14/21  Gudena, Vinay, MD     Allergies:    Allergies  Allergen Reactions   Morphine  And Codeine Nausea And Vomiting   Zofran  [Ondansetron ]     Causes minor constipation, tolerates low doses      Social History:   Social History   Socioeconomic History   Marital status: Married    Spouse name: Not on file   Number of children: 4   Years of education: college   Highest education level: Not on file  Occupational History   Occupation: retired Engineer, civil (consulting)  Tobacco Use   Smoking status: Never   Smokeless tobacco: Never  Vaping Use   Vaping status: Never Used  Substance and Sexual Activity   Alcohol  use: No   Drug use: No   Sexual activity: Not Currently  Other Topics Concern   Not on file  Social History Narrative   Lives at home with husband.   Left-handed.   Caffeine use: 1 cup coffee (half caff), half can of Coke   Social Drivers of Health   Financial Resource Strain: Low Risk  (05/29/2020)   Overall Financial Resource Strain (CARDIA)    Difficulty of Paying Living Expenses: Not hard at all  Food Insecurity: No Food Insecurity (05/29/2020)   Hunger Vital Sign    Worried About Running Out of Food in the Last Year: Never true    Ran Out of Food in the Last Year: Never true  Transportation Needs: No Transportation Needs (05/29/2020)   PRAPARE - Administrator, Civil Service (Medical): No    Lack of Transportation (Non-Medical): No  Physical Activity: Insufficiently Active (05/29/2020)   Exercise Vital Sign    Days of Exercise per Week: 7 days    Minutes of Exercise per Session: 10 min   Stress: No Stress Concern Present (05/29/2020)   [employer] of Occupational Health - Occupational Stress Questionnaire    Feeling of Stress : Not at all  Social  Connections: Moderately Integrated (05/29/2020)   Social Connection and Isolation Panel    Frequency of Communication with Friends and Family: More than three times a week    Frequency of Social Gatherings with Friends and Family: More than three times a week    Attends Religious Services: More than 4 times per year    Active Member of [employer] or Organizations: No    Attends Banker Meetings: Never    Marital Status: Married  Catering manager Violence: Not At Risk (05/29/2020)   Humiliation, Afraid, Rape, and Kick questionnaire    Fear of Current or Ex-Partner: No    Emotionally Abused: No    Physically Abused: No    Sexually Abused: No     Family History:   The patient's family history includes Asthma in her mother; Atrial fibrillation in her brother and sister; Benign prostatic hyperplasia in her father; Esophageal varices in her father; Heart disease in her brother; Heart failure in her mother; Hypertension in her mother; Melanoma in her maternal aunt; Ulcers in her father and mother. There is no history of Colon cancer, Breast cancer, Ovarian cancer, Endometrial cancer, Pancreatic cancer, or Prostate cancer.    ROS:  Please see the history of present illness.  All other ROS reviewed and negative.     Physical Exam/Data: Vitals:   01/22/24 2019 01/22/24 2023 01/22/24 2100 01/22/24 2200  BP:   (!) 126/107 (!) 133/112  Pulse: (!) 136 (!) 123 (!) 140 (!) 152  Resp: (!) 21 17 14 15   Temp:      TempSrc:      SpO2: 97% 100% 100% 100%  Weight:      Height:       No intake or output data in the 24 hours ending 01/22/24 2215    01/22/2024    7:22 PM 12/24/2023   11:51 AM 12/24/2023   11:48 AM  Last 3 Weights  Weight (lbs) 171 lb 173 lb 173 lb 11.6 oz  Weight (kg) 77.565 kg 78.472 kg 78.8 kg     Body mass  index is 37 kg/m.  General:  Well nourished, well developed, in no acute distress HEENT: normal Neck: no JVD Vascular: No carotid bruits; Distal pulses 2+ bilaterally   Cardiac: Irregularly irregular Lungs:  clear to auscultation bilaterally, no wheezing, rhonchi or rales  Abd: soft, nontender, no hepatomegaly  Ext: no edema Musculoskeletal:  No deformities, BUE and BLE strength normal and equal Skin: warm and dry  Neuro:  CNs 2-12 intact, no focal abnormalities noted Psych:  Normal affect    Laboratory Data: High Sensitivity Troponin:   Recent Labs  Lab 12/24/23 1215 12/24/23 1414 01/22/24 1931  TROPONINIHS 5 5 16       Chemistry Recent Labs  Lab 01/22/24 1931  NA 130*  K 4.7  CL 97*  CO2 20*  GLUCOSE 99  BUN 17  CREATININE 0.82  CALCIUM  9.7  GFRNONAA >60  ANIONGAP 13    No results for input(s): PROT, ALBUMIN, AST, ALT, ALKPHOS, BILITOT in the last 168 hours. Lipids No results for input(s): CHOL, TRIG, HDL, LABVLDL, LDLCALC, CHOLHDL in the last 168 hours. Hematology Recent Labs  Lab 01/22/24 1931  WBC 9.3  RBC 4.47  HGB 14.5  HCT 43.8  MCV 98.0  MCH 32.4  MCHC 33.1  RDW 12.9  PLT 329   Thyroid  No results for input(s): TSH, FREET4 in the last 168 hours. BNPNo results for input(s): BNP, PROBNP in the last 168 hours.  DDimer No results for input(s): DDIMER in the last 168 hours.  Radiology/Studies:  DG Chest 2 View Result Date: 01/22/2024 EXAM: 2 VIEW(S) XRAY OF THE CHEST 01/22/2024 07:48:00 PM COMPARISON: 12/24/2023 CLINICAL HISTORY: SOB. sob FINDINGS: LUNGS AND PLEURA: No focal pulmonary opacity. No pulmonary edema. No pleural effusion. No pneumothorax. HEART AND MEDIASTINUM: Stable cardiomediastinal silhouette. Similar right perihilar density favored to represent prominent central pulmonary arteries. Mass or adenopathy not excluded. BONES AND SOFT TISSUES: No acute osseous abnormality. Right chest wall port-a-cath tip in the  mid SVC. IMPRESSION: 1. No acute cardiopulmonary pathology. 2. Similar right perihilar density favored to represent prominent central pulmonary arteries. Mass or adenopathy considered less likely but not excluded. Electronically signed by: Norman Gatlin MD 01/22/2024 07:54 PM EDT RP Workstation: [id number]     Assessment and Plan: Paroxysmal Afib s/p DCCV x2 in the ER-> sinus Brady 30-40s and conversion pause, tachybradycardia syndrome. Hyperlipidemia, obesity, lower extremity edema  Plan:' Admitted under cardiology service, Continue her home medication of diltiazem  180 mg daily, Toprol -XL 100 mg daily, continue telemetry monitor. Continue Eliquis  5 mg twice daily.  No missed doses of Eliquis .  Euvolemic on exam She will need EP evaluation tomorrow.  Strongly favor starting her on antiarrhythmic therapy, however bradycardia was in the setting for post cardioversion and sedation, post cardioversion, I do not suspect she needs any pacemaker right now.  Her heart rate currently has improved to 70s, normal sinus rhythm Recheck echocardiogram in the a.m. Continue home medication  Full code  Risk Assessment/Risk Scores:        CHA2DS2-VASc Score =     This indicates a  % annual risk of stroke. The patient's score is based upon:      Code Status: Full Code  Severity of Illness: The appropriate patient status for this patient is INPATIENT. Inpatient status is judged to be reasonable and necessary in order to provide the required intensity of service to ensure the patient's safety. The patient's presenting symptoms, physical exam findings, and initial radiographic and laboratory data in the context of their chronic comorbidities is felt to place them at high risk for further clinical deterioration. Furthermore, it is not anticipated that the patient will be medically stable for discharge from the hospital within 2 midnights of admission.   * I certify that at the point of admission it is my  clinical judgment that the patient will require inpatient hospital care spanning beyond 2 midnights from the point of admission due to high intensity of service, high risk for further deterioration and high frequency of surveillance required.*  For questions or updates, please contact [facility] Please consult [email or web address] for contact info under     Signed, Grayce Bold, MD  01/22/2024 10:15 PM

## 2024-01-22 NOTE — ED Provider Triage Note (Signed)
 Emergency Medicine Provider Triage Evaluation Note  LIZABETH FELLNER , a 78 y.o. female  was evaluated in triage.  Pt complains of palpitations.  Symptoms have been off and on for approximately 10 days, patient has history of A-fib/SVT.  She reports that her symptoms are worse today, typically she can lie down and have her heart rate slowed down, today however she has become diaphoretic at times and even dizzy on 1 occasion.  She is on diltiazem  and Eliquis .  She has an appointment with the A-fib clinic on 8/7, sees her regular cardiologist later in the month.  Review of Systems  Positive: As above Negative: As above  Physical Exam  BP (!) 154/117 (BP Location: Left Arm)   Pulse (!) 43   Temp 97.7 F (36.5 C) (Oral)   Resp (!) 25   Ht 4' 9 (1.448 m)   Wt 77.6 kg   SpO2 96%   BMI 37.00 kg/m  Gen:   Awake, no distress   Resp:  Normal effort  MSK:   Moves extremities without difficulty  Other:  Tachycardia with irregularly irregular rhythm  Medical Decision Making  Medically screening exam initiated at 7:41 PM.  Appropriate orders placed.  Rock MALVA Devons[place] was informed that the remainder of the evaluation will be completed by another provider, this initial triage assessment does not replace that evaluation, and the importance of remaining in the ED until their evaluation is complete.     Glendia Rocky SAILOR, [place] 01/22/24 1942

## 2024-01-22 NOTE — ED Triage Notes (Signed)
 Pt to ED c/o chest tightness, palpitations that started today, reports hx of the same. Reports hx of afib.

## 2024-01-22 NOTE — ED Triage Notes (Signed)
 Pt c/o palpitations and feeling like she is in a fib with prior hx of same and multiple cardioversions. Pt also has hx SVT. Pt started taking cardizem  2 weeks ago and recently increased her dose to 180mg . Pt feels sweaty, lightheaded, and SOB. A/O x 4; HR 141 noted in triage.

## 2024-01-22 NOTE — ED Provider Notes (Signed)
 [place] EMERGENCY DEPARTMENT AT [facility] Provider Note   CSN: [id number] Arrival date & time: 01/22/24  8094     Patient presents with: Palpitations   April Guerra is a 78 y.o. female history of A-fib on Eliquis , here presenting with palpitations.  Patient has intermittent palpitations and had a Holter monitor placed that showed SVT in the 180s.  Patient was prescribed Cardizem  already and dose was increased to 180 mg from 120 mg.  Patient states that she still has some palpitations.  She states that she had previous cardioversion that was successful. Last meal was 1 pm.    The history is provided by the patient.       Prior to Admission medications   Medication Sig Start Date End Date Taking? Authorizing Provider  acetaminophen  (TYLENOL ) 500 MG tablet Take 1,000 mg by mouth 3 (three) times daily.   Yes [provider]  albuterol  (VENTOLIN  HFA) 108 (90 Base) MCG/ACT inhaler Inhale 2 puffs into the lungs every 4 (four) hours as needed for wheezing or shortness of breath. 05/09/20  Yes Emokpae, Courage, MD  apixaban  (ELIQUIS ) 5 MG TABS tablet Take 1 tablet (5 mg total) by mouth 2 (two) times daily. 07/13/23  Yes BranchDorn FALCON, MD  Calcium  Carb-Cholecalciferol  (CALCIUM  600/VITAMIN D PO) Take 600 mg by mouth in the morning and at bedtime.   Yes [provider]  cetirizine (ZYRTEC) 10 MG tablet Take 10 mg by mouth every other day.   Yes [provider]  Cholecalciferol  (VITAMIN D) 125 MCG (5000 UT) CAPS Take 5,000 Units by mouth daily.   Yes [provider]  Coenzyme Q10 (COQ10) 100 MG CAPS Take 100 mg by mouth at bedtime.    Yes [provider]  cyclobenzaprine  (FLEXERIL ) 10 MG tablet Take 5 mg by mouth See admin instructions. Take 5 mg at night, may take a second 5 mg dose during the day as needed for muscle spasms 04/22/19  Yes [provider]  diltiazem  (CARDIZEM  CD) 180 MG 24 hr capsule Take 1 capsule (180 mg total)  by mouth daily. 01/13/24  Yes BranchDorn FALCON, MD  diltiazem  (CARDIZEM ) 30 MG tablet Take 1 tablet every 4 hours AS NEEDED for AFIB heart rate >100 06/12/23  Yes Branch, Dorn FALCON, MD  famotidine  (PEPCID ) 40 MG tablet Take 40 mg by mouth daily as needed for heartburn. 03/17/23  Yes [provider]  fluticasone  (FLONASE ) 50 MCG/ACT nasal spray Place 1 spray into both nostrils daily. 11/11/19  Yes [provider]  furosemide  (LASIX ) 20 MG tablet TAKE 1 TABLET BY MOUTH DAILY AS  NEEDED FOR SWELLING 06/02/22  Yes Branch, Dorn FALCON, MD  gabapentin  (NEURONTIN ) 300 MG capsule Take 300 mg by mouth daily.   Yes [provider]  lisinopril  (ZESTRIL ) 20 MG tablet Take 30 mg by mouth daily. 02/24/23  Yes [provider]  Magnesium  100 MG TABS Take 50 mg by mouth at bedtime.   Yes [provider]  melatonin 5 MG TABS Take 2.5 mg by mouth at bedtime as needed (Sleep).   Yes [provider]  metoprolol  succinate (TOPROL -XL) 100 MG 24 hr tablet Take 1 tablet (100 mg total) by mouth in the morning. 11/03/22   Alvan Dorn FALCON, MD  Multiple Vitamins-Minerals (CENTRUM SILVER  ADULT 50+ PO) Take 1 tablet by mouth daily.    [provider]  Omega-3 Fatty Acids (FISH OIL) 1000 MG CAPS Take 1,000 mg by mouth daily.  [provider]  oseltamivir (TAMIFLU) 75 MG capsule Take 75 mg by mouth daily. 09/08/23   [provider]  pantoprazole  (PROTONIX ) 40 MG tablet Take 1 tablet (40 mg total) by mouth daily. 08/07/20 01/23/79  [name], MD  polyethylene glycol powder (GLYCOLAX/MIRALAX) 17 GM/SCOOP powder Take 17 g by mouth daily as needed for mild constipation or moderate constipation (With lunch).    [provider]  Polyvinyl Alcohol -Povidone PF (REFRESH) 1.4-0.6 % SOLN Apply 1 drop to eye as needed.    [provider]  rosuvastatin  (CRESTOR ) 5 MG tablet Take 1 tablet (5 mg total) by mouth daily. 01/14/24   Alvan Dorn FALCON, MD  sodium chloride  (OCEAN) 0.65 % SOLN nasal spray Place 1 spray into both nostrils See admin instructions. Use 1 spray in each nostril in the morning may use a second dose at night as needed for congestion    [provider]  triamcinolone  (KENALOG ) 0.1 % Apply 1 application topically daily as needed (irritation). 05/22/20   [provider]  vitamin C  (ASCORBIC ACID ) 500 MG tablet Take 1,000 mg by mouth 2 (two) times daily.    [provider]  Vitamins A & D (VITAMIN A & D) ointment Apply 1 application topically daily as needed for dry skin (irritation).    [provider]  prochlorperazine  (COMPAZINE ) 10 MG tablet Take 1 tablet (10 mg total) by mouth every 6 (six) hours as needed (Nausea or vomiting). 05/29/20 12/14/21  Gudena, Vinay, MD    Allergies: Morphine  and codeine and Zofran  [ondansetron ]    Review of Systems  Cardiovascular:  Positive for palpitations.  All other systems reviewed and are negative.   Updated Vital Signs BP (!) 188/81   Pulse 62   Temp 97.7 F (36.5 C) (Oral)   Resp 15   Ht 4' 9 (1.448 m)   Wt 77.6 kg   SpO2 100%   BMI 37.00 kg/m   Physical Exam Vitals and nursing note reviewed.  Constitutional:      Appearance: Normal appearance.  HENT:     Head: Normocephalic.     Nose: Nose normal.     Mouth/Throat:     Mouth: Mucous membranes are moist.  Eyes:     Extraocular Movements: Extraocular movements intact.     Pupils: Pupils are equal, round, and reactive to light.  Cardiovascular:     Rate and Rhythm: Tachycardia present. Rhythm irregular.     Heart sounds: Normal heart sounds.  Pulmonary:     Effort: Pulmonary effort is normal.     Breath sounds: Normal breath sounds.  Abdominal:     General: Abdomen is flat.     Palpations: Abdomen is soft.  Musculoskeletal:        General: Normal range of motion.     Cervical back: Normal range of motion and neck supple.  Skin:    General: Skin is warm.   Neurological:     General: No focal deficit present.     Mental Status: She is alert.  Psychiatric:        Mood and Affect: Mood normal.     (all labs ordered are listed, but only abnormal results are displayed) Labs Reviewed  BASIC METABOLIC PANEL WITH GFR - Abnormal; Notable for the following components:      Result Value   Sodium 130 (*)    Chloride 97 (*)    CO2 20 (*)    All other components within normal limits  CBC  PROTIME-INR  TROPONIN I (HIGH SENSITIVITY)  TROPONIN I (HIGH SENSITIVITY)    EKG: None  Radiology: DG Chest 2 View Result Date: 01/22/2024 EXAM: 2 VIEW(S) XRAY OF THE CHEST 01/22/2024 07:48:00 PM COMPARISON: 12/24/2023 CLINICAL HISTORY: SOB. sob FINDINGS: LUNGS AND PLEURA: No focal pulmonary opacity. No pulmonary edema. No pleural effusion. No pneumothorax. HEART AND MEDIASTINUM: Stable cardiomediastinal silhouette. Similar right perihilar density favored to represent prominent central pulmonary arteries. Mass or adenopathy not excluded. BONES AND SOFT TISSUES: No acute osseous abnormality. Right chest wall port-a-cath tip in the mid SVC. IMPRESSION: 1. No acute cardiopulmonary pathology. 2. Similar right perihilar density favored to represent prominent central pulmonary arteries. Mass or adenopathy considered less likely but not excluded. Electronically signed by: Norman Gatlin MD 01/22/2024 07:54 PM EDT RP Workstation: [id number]     .Sedation  Date/Time: 01/22/2024 10:50 PM  Performed by: Patt Alm Macho, MD Authorized by: Patt Alm Macho, MD   Consent:    Consent obtained:  Written   Consent given by:  Patient   Alternatives discussed:  Analgesia without sedation Universal protocol:    Immediately prior to procedure, a time out was called: yes     Patient identity confirmed:  Anonymous protocol, patient vented/unresponsive Pre-sedation assessment:    Time since last food or drink:  8 hours   ASA classification: class 1 - normal, healthy patient      Mallampati score:  I - soft palate, uvula, fauces, pillars visible   Pre-sedation assessments completed and reviewed: airway patency   A pre-sedation assessment was completed prior to the start of the procedure Procedure details (see MAR for exact dosages):    Sedation:  Etomidate    Intended level of sedation: deep   Total Provider sedation time (minutes):  30 Post-procedure details:   A post-sedation assessment was completed following the completion of the procedure.   Attendance: Constant attendance by certified staff until patient recovered     Recovery: Patient returned to pre-procedure baseline     Procedure completion:  Tolerated well, no immediate complications .Cardioversion  Date/Time: 01/22/2024 10:54 PM  Performed by: Patt Alm Macho, MD Authorized by: Patt Alm Macho, MD   Consent:    Consent obtained:  Verbal   Consent given by:  Patient   Risks discussed:  Cutaneous burn   Alternatives discussed:  No treatment Pre-procedure details:    Cardioversion basis:  Emergent   Rhythm:  Atrial fibrillation   Electrode placement:  Anterior-posterior Patient sedated: Yes. Refer to sedation procedure documentation for details of sedation.  Attempt one:    Cardioversion mode:  Synchronous   Shock (Joules):  200   Shock outcome:  No change in rhythm Attempt two:    Cardioversion mode:  Synchronous   Shock (Joules):  200   Shock outcome:  No change in rhythm Post-procedure details:    Patient status:  Awake   Patient tolerance of procedure:  Tolerated well, no immediate complications     CRITICAL CARE Performed by: Alm VEAR Patt   Total critical care time: 45 minutes  Critical care time was exclusive of separately billable procedures and treating other patients.  Critical care was necessary to treat or prevent imminent or life-threatening deterioration.  Critical care was time spent personally by me on the following activities: development of treatment plan  with patient and/or surrogate as well as nursing, discussions with consultants, evaluation of patient's response to treatment, examination of patient, obtaining history from patient or surrogate, ordering and performing treatments  and interventions, ordering and review of laboratory studies, ordering and review of radiographic studies, pulse oximetry and re-evaluation of patient's condition.  Medications Ordered in the ED  etomidate  (AMIDATE ) injection 10 mg (10 mg Intravenous Given 01/22/24 2144)  diltiazem  (CARDIZEM ) injection 10 mg (10 mg Intravenous Given 01/22/24 2153)                                    Medical Decision Making April Guerra is a 78 y.o. female history of A-fib with rapid A-fib.  Patient has previous cardioversion and last meal was 1 PM.  I discussed risks and benefits about cardioversion.  Patient is agreeable to perform a cardioversion  10 pm I performed cardioversion and shocked her with 200 J x 2.  Patient initially went into sinus rhythm and went back into A-fib.  Patient then had several long pauses of 10 to 15 seconds and then goes back into A-fib.  Consulted cardiology who will admit  Problems Addressed: Atrial fibrillation with rapid ventricular response (HCC): acute illness or injury  Amount and/or Complexity of Data Reviewed Labs: ordered. Decision-making details documented in ED Course. Radiology: ordered and independent interpretation performed. Decision-making details documented in ED Course.  Risk Prescription drug management. Decision regarding hospitalization.    Final diagnoses:  Paroxysmal atrial fibrillation [facility])    ED Discharge Orders     None          Patt Alm Macho, MD 01/22/24 2255

## 2024-01-22 NOTE — ED Notes (Signed)
 Assisted patient to use bedpan in room. Pt assisted back to bed and made comfortable.  Family at bedside.

## 2024-01-22 NOTE — ED Notes (Signed)
 Cardioversion with sedation began at 2144 with etom 10mg   2145 200j shock remain afib 2146 200j shock remain afib 2153 10mg  cardizem  adm 2205 pt awake and alert 2205 sedation end 2217 Pt noted to be in NSR

## 2024-01-23 ENCOUNTER — Other Ambulatory Visit (HOSPITAL_COMMUNITY): Payer: Self-pay

## 2024-01-23 DIAGNOSIS — I495 Sick sinus syndrome: Secondary | ICD-10-CM

## 2024-01-23 LAB — CBC
HCT: 37.8 % (ref 36.0–46.0)
Hemoglobin: 13.2 g/dL (ref 12.0–15.0)
MCH: 33.8 pg (ref 26.0–34.0)
MCHC: 34.9 g/dL (ref 30.0–36.0)
MCV: 96.9 fL (ref 80.0–100.0)
Platelets: 266 K/uL (ref 150–400)
RBC: 3.9 MIL/uL (ref 3.87–5.11)
RDW: 13.1 % (ref 11.5–15.5)
WBC: 7.8 K/uL (ref 4.0–10.5)
nRBC: 0 % (ref 0.0–0.2)

## 2024-01-23 LAB — BASIC METABOLIC PANEL WITH GFR
Anion gap: 8 (ref 5–15)
BUN: 13 mg/dL (ref 8–23)
CO2: 22 mmol/L (ref 22–32)
Calcium: 8.9 mg/dL (ref 8.9–10.3)
Chloride: 106 mmol/L (ref 98–111)
Creatinine, Ser: 0.57 mg/dL (ref 0.44–1.00)
GFR, Estimated: >60 mL/min (ref >60)
Glucose, Bld: 89 mg/dL (ref 70–99)
Potassium: 3.6 mmol/L (ref 3.5–5.1)
Sodium: 136 mmol/L (ref 135–145)

## 2024-01-23 MED ORDER — FAMOTIDINE 20 MG PO TABS
40.0000 mg | ORAL_TABLET | Freq: Every day | ORAL | Status: DC
  Administered 2024-01-23 – 2024-01-24 (×2): 40 mg via ORAL
  Filled 2024-01-23 (×2): qty 2

## 2024-01-23 MED ORDER — GABAPENTIN 300 MG PO CAPS
300.0000 mg | ORAL_CAPSULE | Freq: Every day | ORAL | Status: DC
  Administered 2024-01-23: 300 mg via ORAL
  Filled 2024-01-23 (×3): qty 1

## 2024-01-23 MED ORDER — DRONEDARONE HCL 400 MG PO TABS
400.0000 mg | ORAL_TABLET | Freq: Two times a day (BID) | ORAL | Status: DC
  Filled 2024-01-23: qty 1

## 2024-01-23 MED ORDER — DRONEDARONE HCL 400 MG PO TABS
400.0000 mg | ORAL_TABLET | Freq: Two times a day (BID) | ORAL | 5 refills | Status: DC
  Filled 2024-01-23: qty 60, 30d supply, fill #0

## 2024-01-23 MED ORDER — OMEGA-3-ACID ETHYL ESTERS 1 G PO CAPS
1.0000 g | ORAL_CAPSULE | Freq: Every day | ORAL | Status: DC
  Administered 2024-01-23 – 2024-01-24 (×2): 1 g via ORAL
  Filled 2024-01-23 (×2): qty 1

## 2024-01-23 MED ORDER — MELATONIN 5 MG PO TABS
2.5000 mg | ORAL_TABLET | Freq: Every evening | ORAL | Status: DC | PRN
  Administered 2024-01-23: 2.5 mg via ORAL
  Filled 2024-01-23: qty 1

## 2024-01-23 MED ORDER — FLUTICASONE PROPIONATE 50 MCG/ACT NA SUSP
1.0000 | Freq: Every day | NASAL | Status: DC
  Administered 2024-01-24: 1 via NASAL
  Filled 2024-01-23: qty 16

## 2024-01-23 MED ORDER — METOPROLOL SUCCINATE ER 50 MG PO TB24
50.0000 mg | ORAL_TABLET | Freq: Every day | ORAL | 5 refills | Status: DC
  Filled 2024-01-23: qty 30, 30d supply, fill #0

## 2024-01-23 MED ORDER — DILTIAZEM HCL ER COATED BEADS 120 MG PO CP24
120.0000 mg | ORAL_CAPSULE | Freq: Every day | ORAL | 5 refills | Status: DC
  Filled 2024-01-23: qty 30, 30d supply, fill #0

## 2024-01-23 MED ORDER — METOPROLOL SUCCINATE ER 50 MG PO TB24
50.0000 mg | ORAL_TABLET | Freq: Every day | ORAL | Status: DC
  Administered 2024-01-24: 50 mg via ORAL
  Filled 2024-01-23: qty 1

## 2024-01-23 MED ORDER — ROSUVASTATIN CALCIUM 5 MG PO TABS
5.0000 mg | ORAL_TABLET | Freq: Every day | ORAL | Status: DC
  Administered 2024-01-23 – 2024-01-24 (×2): 5 mg via ORAL
  Filled 2024-01-23 (×2): qty 1

## 2024-01-23 MED ORDER — ACETAMINOPHEN 325 MG PO TABS
650.0000 mg | ORAL_TABLET | ORAL | Status: DC | PRN
  Administered 2024-01-23: 650 mg via ORAL
  Filled 2024-01-23: qty 2

## 2024-01-23 MED ORDER — DILTIAZEM HCL ER COATED BEADS 120 MG PO CP24
120.0000 mg | ORAL_CAPSULE | Freq: Every day | ORAL | Status: DC
  Administered 2024-01-24: 120 mg via ORAL
  Filled 2024-01-23: qty 1

## 2024-01-23 MED ORDER — CYCLOBENZAPRINE HCL 10 MG PO TABS: 5.0000 mg | ORAL_TABLET | ORAL | Status: DC

## 2024-01-23 MED ORDER — COQ10 100 MG PO CAPS: 100.0000 mg | ORAL_CAPSULE | Freq: Every day | ORAL | Status: DC

## 2024-01-23 MED ORDER — DRONEDARONE HCL 400 MG PO TABS: 400.0000 mg | ORAL_TABLET | Freq: Two times a day (BID) | ORAL | Status: DC

## 2024-01-23 MED ORDER — METOPROLOL SUCCINATE ER 100 MG PO TB24
100.0000 mg | ORAL_TABLET | Freq: Every day | ORAL | Status: DC
  Administered 2024-01-23: 100 mg via ORAL
  Filled 2024-01-23: qty 1

## 2024-01-23 MED ORDER — DILTIAZEM HCL ER COATED BEADS 180 MG PO CP24
180.0000 mg | ORAL_CAPSULE | Freq: Every day | ORAL | Status: DC
  Administered 2024-01-23: 180 mg via ORAL
  Filled 2024-01-23: qty 1

## 2024-01-23 MED ORDER — DRONEDARONE HCL 400 MG PO TABS
400.0000 mg | ORAL_TABLET | Freq: Two times a day (BID) | ORAL | Status: DC
  Administered 2024-01-23 – 2024-01-24 (×2): 400 mg via ORAL
  Filled 2024-01-23 (×3): qty 1

## 2024-01-23 MED ORDER — LISINOPRIL 20 MG PO TABS
20.0000 mg | ORAL_TABLET | Freq: Every day | ORAL | Status: DC
Start: 2024-01-23 — End: 2024-01-24
  Administered 2024-01-23 – 2024-01-24 (×2): 20 mg via ORAL
  Filled 2024-01-23 (×2): qty 1

## 2024-01-23 MED ORDER — POTASSIUM CHLORIDE CRYS ER 20 MEQ PO TBCR
40.0000 meq | EXTENDED_RELEASE_TABLET | Freq: Once | ORAL | Status: AC
  Administered 2024-01-23: 40 meq via ORAL
  Filled 2024-01-23: qty 2

## 2024-01-23 MED ORDER — CYCLOBENZAPRINE HCL 10 MG PO TABS
5.0000 mg | ORAL_TABLET | Freq: Every day | ORAL | Status: DC | PRN
  Filled 2024-01-23: qty 1

## 2024-01-23 MED ORDER — DRONEDARONE HCL 400 MG PO TABS
400.0000 mg | ORAL_TABLET | Freq: Two times a day (BID) | ORAL | Status: DC
  Administered 2024-01-23: 400 mg via ORAL
  Filled 2024-01-23: qty 1

## 2024-01-23 MED ORDER — CYCLOBENZAPRINE HCL 10 MG PO TABS
5.0000 mg | ORAL_TABLET | Freq: Every day | ORAL | Status: DC
  Administered 2024-01-23: 5 mg via ORAL
  Filled 2024-01-23 (×2): qty 1

## 2024-01-23 MED ORDER — APIXABAN 5 MG PO TABS
5.0000 mg | ORAL_TABLET | Freq: Two times a day (BID) | ORAL | Status: DC
  Administered 2024-01-23 – 2024-01-24 (×4): 5 mg via ORAL
  Filled 2024-01-23 (×4): qty 1

## 2024-01-23 NOTE — Discharge Summary (Incomplete)
 Discharge Summary   Patient ID: April Guerra MRN: [id number]; DOB: 20-Nov-1945  Admit date: 01/22/2024 Discharge date: 01/24/2024  PCP:  Atilano Deward ORN, MD   [facility] Providers Cardiologist:  Alvan Carrier, MD       Discharge Diagnoses  Principal Problem:   Tachy-brady syndrome [facility]) Active Problems:   Atrial fibrillation [facility])  Diagnostic Studies/Procedures   None this admission.   History of Present Illness   April Guerra is a 78 y.o. female with past medical history of paroxysmal atrial fibrillation, HTN and HLD who presented to [facility] ED on 01/22/2024 for evaluation of chest pain and palpitations.  She reported having intermittent palpitations over the past several weeks and recent cardiac monitor had shown brief episodes of SVT with the longest lasting for 26.9 seconds. She reported worsening palpitations along with intermittent lightheadedness and shortness of breath. While in the ED, she was found to be in atrial fibrillation with RVR with heart rate in the 140's. She underwent DCCV x 2 with return to sinus bradycardia with heart rate in the 30's to 40's but did have post-conversion pauses. She was continued on Cardizem  CD 180 mg daily, Toprol -XL 100 mg daily and Eliquis  5 mg twice daily for anticoagulation with plans for EP evaluation the following morning.  Hospital Course    She was evaluated by Dr. Kennyth the following morning and denied any symptoms at that time. Given evidence of tachy-brady syndrome, antiarrhythmic options were reviewed and she preferred catheter ablation in the long-term but in the short-term, was recommended to try Dronedarone . Dr. Kennyth recommended starting Dronedarone  400mg  BID and decreasing Cardizem  CD to 120mg  daily and Toprol -XL to 50mg  daily. She was initially going to be discharged on 01/23/2024 but continued to have frequent bursts of tachycardia, therefore was monitored overnight.  On 01/24/2024, she was evaluated by Dr. Kennyth and  reported overall feeling well but was having frequent bursts of atrial tachycardia despite being on Multaq . Options were reviewed in regards to remaining admitted and planning for stress test or coronary CTA for potential initiation of Flecainide or transitioning to Amiodarone  and she favored starting Amiodarone  until ablation is able to be arranged. Recommend to start Amiodarone  200 mg twice daily for 14 days then 200mg  daily. Order for outpatient echocardiogram was entered she will follow-up with Dr. Kennyth in approximately 2 weeks.  __________  Discharge Vitals Blood pressure (!) 139/98, pulse 97, temperature 98 F (36.7 C), temperature source Oral, resp. rate 16, height 4' 9 (1.448 m), weight 77.5 kg, SpO2 96%.  Filed Weights   01/22/24 1922 01/22/24 2343  Weight: 77.6 kg 77.5 kg    Labs & Radiologic Studies  CBC Recent Labs    01/22/24 1931 01/23/24 0429  WBC 9.3 7.8  HGB 14.5 13.2  HCT 43.8 37.8  MCV 98.0 96.9  PLT 329 266   Basic Metabolic Panel Recent Labs    91/98/74 1931 01/23/24 0429  NA 130* 136  K 4.7 3.6  CL 97* 106  CO2 20* 22  GLUCOSE 99 89  BUN 17 13  CREATININE 0.82 0.57  CALCIUM  9.7 8.9   Liver Function Tests No results for input(s): AST, ALT, ALKPHOS, BILITOT, PROT, ALBUMIN in the last 72 hours. No results for input(s): LIPASE, AMYLASE in the last 72 hours. High Sensitivity Troponin:   Recent Labs  Lab 01/22/24 1931 01/22/24 2144  TROPONINIHS 16 14    No results for input(s): TRNPT in the last 720 hours.  BNP Invalid  input(s): POCBNP No results for input(s): PROBNP in the last 72 hours.  No results for input(s): BNP in the last 72 hours.  D-Dimer No results for input(s): DDIMER in the last 72 hours. Hemoglobin A1C No results for input(s): HGBA1C in the last 72 hours. Fasting Lipid Panel No results for input(s): CHOL, HDL, LDLCALC, TRIG, CHOLHDL, LDLDIRECT in the last 72 hours. No results found for:  LIPOA  Thyroid  Function Tests No results for input(s): TSH, T4TOTAL, T3FREE, THYROIDAB in the last 72 hours.  Invalid input(s): FREET3 _____________  DG Chest 2 View Result Date: 01/22/2024 EXAM: 2 VIEW(S) XRAY OF THE CHEST 01/22/2024 07:48:00 PM COMPARISON: 12/24/2023 CLINICAL HISTORY: SOB. sob FINDINGS: LUNGS AND PLEURA: No focal pulmonary opacity. No pulmonary edema. No pleural effusion. No pneumothorax. HEART AND MEDIASTINUM: Stable cardiomediastinal silhouette. Similar right perihilar density favored to represent prominent central pulmonary arteries. Mass or adenopathy not excluded. BONES AND SOFT TISSUES: No acute osseous abnormality. Right chest wall port-a-cath tip in the mid SVC. IMPRESSION: 1. No acute cardiopulmonary pathology. 2. Similar right perihilar density favored to represent prominent central pulmonary arteries. Mass or adenopathy considered less likely but not excluded. Electronically signed by: Norman Gatlin MD 01/22/2024 07:54 PM EDT RP Workstation: [id number]    Disposition Pt is being discharged home today in good condition.  Follow-up Plans & Appointments  Follow-up Information     Kennyth Chew, MD Follow up.   Specialties: Cardiology, Radiology Why: The office should contact you within 2-3 business days to arrange follow-up. If you do not hear from them within this timeframe, please contact the office. Contact information: [address] [place] [address] [phone]                  Discharge Medications Allergies as of 01/24/2024       Reactions   Morphine  And Codeine Nausea And Vomiting   Zofran  [ondansetron ] Other (See Comments)   Constipation         Medication List     TAKE these medications    acetaminophen  500 MG tablet Commonly known as: TYLENOL  Take 500 mg by mouth See admin instructions. Take 1 tablet (500mg ) by mouth every morning and night. May take an additional 1 tablet mid-day if needed for pain.    albuterol  108 (90 Base) MCG/ACT inhaler Commonly known as: VENTOLIN  HFA Inhale 2 puffs into the lungs every 4 (four) hours as needed for wheezing or shortness of breath.   amiodarone  200 MG tablet Commonly known as: PACERONE  Take 1 tablet (200 mg total) by mouth 2 (two) times daily for 14 days, THEN 1 tablet (200 mg total) daily. Start taking on: January 24, 2024   apixaban  5 MG Tabs tablet Commonly known as: ELIQUIS  Take 1 tablet (5 mg total) by mouth 2 (two) times daily.   CALCIUM  + VITAMIN D3 PO Take 1 tablet by mouth 2 (two) times daily.   cetirizine 10 MG tablet Commonly known as: ZYRTEC Take 10 mg by mouth every Monday, Wednesday, and Friday.   COQ10 PO Take 1 capsule by mouth at bedtime.   cyclobenzaprine  10 MG tablet Commonly known as: FLEXERIL  Take 5 mg by mouth at bedtime.   diltiazem  120 MG 24 hr capsule Commonly known as: CARDIZEM  CD Take 1 capsule (120 mg total) by mouth daily. What changed:  medication strength how much to take   diltiazem  30 MG tablet Commonly known as: Cardizem  Take 1 tablet every 4 hours AS NEEDED for AFIB heart rate >100  famotidine  40 MG tablet Commonly known as: PEPCID  Take 40 mg by mouth daily as needed for heartburn.   FISH OIL PO Take 1 capsule by mouth daily.   fluticasone  50 MCG/ACT nasal spray Commonly known as: FLONASE  Place 1 spray into both nostrils daily.   furosemide  20 MG tablet Commonly known as: LASIX  TAKE 1 TABLET BY MOUTH DAILY AS  NEEDED FOR SWELLING   gabapentin  300 MG capsule Commonly known as: NEURONTIN  Take 300 mg by mouth at bedtime.   lisinopril  20 MG tablet Commonly known as: ZESTRIL  Take 30 mg by mouth daily.   MAGNESIUM  PO Take 150 mg by mouth at bedtime.   melatonin 5 MG Tabs Take 2.5 mg by mouth at bedtime as needed (sleep).   metoprolol  succinate 50 MG 24 hr tablet Commonly known as: TOPROL -XL Take 1 tablet (50 mg total) by mouth daily. Take with or immediately following a  meal. What changed:  medication strength how much to take when to take this additional instructions   Multivitamin Women 50+ Tabs Take 1 tablet by mouth daily.   pantoprazole  40 MG tablet Commonly known as: Protonix  Take 1 tablet (40 mg total) by mouth daily.   polyethylene glycol powder 17 GM/SCOOP powder Commonly known as: GLYCOLAX/MIRALAX Take 17 g by mouth daily as needed (constipation).   rosuvastatin  5 MG tablet Commonly known as: CRESTOR  Take 1 tablet (5 mg total) by mouth daily. What changed: when to take this   sodium chloride  0.65 % Soln nasal spray Commonly known as: OCEAN Place 1 spray into both nostrils daily. Use 1 spray in each nostril in the morning may use a second dose at night as needed for congestion   SYSTANE OP Place 1 drop into both eyes daily as needed (dry eyes).   vitamin A & D ointment Apply 1 application  topically daily as needed (skin irritation).   vitamin C  1000 MG tablet Take 1,000 mg by mouth daily.   Vitamin D 125 MCG (5000 UT) Caps Take 5,000 Units by mouth daily.         Outstanding Labs/Studies  None  Duration of Discharge Encounter: APP Time: 29 minutes   Signed, Laymon CHRISTELLA Qua, PA-C 01/24/2024, 1:06 PM

## 2024-01-23 NOTE — Progress Notes (Signed)
  Progress Note  Patient Name: April Guerra Date of Encounter: 01/23/2024 [facility] Cardiologist: Alvan Carrier, MD   Interval Summary   No acute overnight events. Patient reports feeling relatively well. No new or acute complaints.   Vital Signs Vitals:   01/22/24 2240 01/22/24 2343 01/23/24 0441 01/23/24 0821  BP: (!) 188/81 (!) 174/84 (!) 145/75 (!) 159/75  Pulse: 62 72 73 66  Resp: 15 16 20    Temp: 98 F (36.7 C) 97.9 F (36.6 C) 98.1 F (36.7 C) 98.4 F (36.9 C)  TempSrc: Oral Oral Oral Oral  SpO2: 100% 97% 97% 98%  Weight:  77.5 kg    Height:       No intake or output data in the 24 hours ending 01/23/24 1111    01/22/2024   11:43 PM 01/22/2024    7:22 PM 12/24/2023   11:51 AM  Last 3 Weights  Weight (lbs) 170 lb 14.4 oz 171 lb 173 lb  Weight (kg) 77.52 kg 77.565 kg 78.472 kg      Telemetry/ECG  Burst of SVT, otherwise SR - Personally Reviewed  Physical Exam  General: Well developed, in no acute distress.  Neck: No JVD.  Cardiac: Normal rate, regular rhythm.  Resp: Normal work of breathing.  Ext: No edema.  Neuro: No gross focal deficits.  Psych: Normal affect.   Assessment & Plan  [name] is a 78 y.o. female with paroxysmal Afib s/p DCCV 2019, 2024, HTN< HLD, Obesity who presented with symptomatic paroxysm of AF with rapid rates. She underwent DCCV and had some sinus bradycardia and pause.   #. Paroxysmal AF #. ?SVT - question if just PV AT #. Secondary hypercoagulable state due to AF  #. Tachycardia bradycardia syndrome  She warrants rhythm control in setting of tachycardia - bradycardia syndrome. We discussed short-term and long-term rhythm control options. She prefers catheter ablation long-term. In the short-term, we will trial dronedarone  for AAD therapy to maintain sinus rhythm. We will dose adjust diltiazem  and metoprolol  to accommodate dronedarone . I will arrange for her to see me in clinic in ~ 2 weeks. If dronedarone  is not working  then we can consider stress test and potential 1C agents, if normal.  She can have echocardiogram done as outpatient. Last EF normal and no signs or symptoms of CHF.   Anticipate discharge today.    For questions or updates, please contact [facility] Please consult [email or web address] for contact info under       Signed, Fonda Kitty, MD

## 2024-01-23 NOTE — Progress Notes (Signed)
 Patient having increased runs of tachycardia into the 140s. Patient denies chest pain or shortness of breath or dizziness. She states that she can feel palpations and that she feels weak. April Qua PA made aware via phone; see new orders.

## 2024-01-23 NOTE — Progress Notes (Signed)
   Reviewed telemetry and the patient is still having frequent, self-resolving episodes of SVT. Patient symptomatic with lying down and no symptoms since sitting up for the past 15 minutes. She prefers observation overnight and reviewed with Dr. Kennyth and will plan for reassessment tomorrow. Ordered additional dose of Multaq  for this evening and will continue 400mg  BID. If still having frequent episodes tomorrow, may need to consider Amiodarone .   Signed, Laymon CHRISTELLA Qua, PA-C 01/23/2024, 5:04 PM Pager: [phone]

## 2024-01-23 NOTE — Plan of Care (Signed)
  Problem: Education: Goal: Knowledge of General Education information will improve Description: Including pain rating scale, medication(s)/side effects and non-pharmacologic comfort measures Outcome: Progressing   Problem: Health Behavior/Discharge Planning: Goal: Ability to manage health-related needs will improve Outcome: Progressing   Problem: Clinical Measurements: Goal: Ability to maintain clinical measurements within normal limits will improve Outcome: Progressing Goal: Diagnostic test results will improve Outcome: Progressing Goal: Respiratory complications will improve Outcome: Progressing Goal: Cardiovascular complication will be avoided Outcome: Progressing   Problem: Activity: Goal: Risk for activity intolerance will decrease Outcome: Progressing   Problem: Nutrition: Goal: Adequate nutrition will be maintained Outcome: Progressing   Problem: Coping: Goal: Level of anxiety will decrease Outcome: Progressing   Problem: Safety: Goal: Ability to remain free from injury will improve Outcome: Progressing   Problem: Education: Goal: Understanding of medication regimen will improve Outcome: Progressing

## 2024-01-24 ENCOUNTER — Other Ambulatory Visit: Payer: Self-pay | Admitting: Student

## 2024-01-24 DIAGNOSIS — I480 Paroxysmal atrial fibrillation: Secondary | ICD-10-CM

## 2024-01-24 DIAGNOSIS — R002 Palpitations: Secondary | ICD-10-CM

## 2024-01-24 DIAGNOSIS — I471 Supraventricular tachycardia, unspecified: Secondary | ICD-10-CM

## 2024-01-24 MED ORDER — AMIODARONE HCL 200 MG PO TABS: 200.0000 mg | ORAL_TABLET | Freq: Two times a day (BID) | ORAL | Status: DC

## 2024-01-24 MED ORDER — AMIODARONE HCL 200 MG PO TABS: ORAL_TABLET | ORAL | 0 refills | Status: DC

## 2024-01-24 MED ORDER — AMIODARONE HCL 200 MG PO TABS: 200.0000 mg | ORAL_TABLET | Freq: Every day | ORAL | Status: DC

## 2024-01-24 NOTE — Progress Notes (Addendum)
  Progress Note  Patient Name: April Guerra Date of Encounter: 01/24/2024 [facility] Cardiologist: Alvan Carrier, MD   Interval Summary   No acute overnight events. Patient reports feeling relatively well. No new or acute complaints.   Continues to have bursts of AT despite starting Multaq .  Vital Signs Vitals:   01/23/24 1957 01/23/24 2347 01/24/24 0428 01/24/24 0748  BP: (!) 151/76 (!) 142/63 (!) 145/76 (!) 151/76  Pulse: 92 62 60 (!) 56  Resp: 18 16 18 14   Temp: 97.8 F (36.6 C) 98.6 F (37 C) 97.9 F (36.6 C) 97.8 F (36.6 C)  TempSrc: Oral Oral Oral Oral  SpO2: 97% 95% 98% 96%  Weight:      Height:        Intake/Output Summary (Last 24 hours) at 01/24/2024 1132 Last data filed at 01/24/2024 1000 Gross per 24 hour  Intake 220 ml  Output --  Net 220 ml      01/22/2024   11:43 PM 01/22/2024    7:22 PM 12/24/2023   11:51 AM  Last 3 Weights  Weight (lbs) 170 lb 14.4 oz 171 lb 173 lb  Weight (kg) 77.52 kg 77.565 kg 78.472 kg      Telemetry/ECG  SR with PACs and frequent bursts of AT - Personally Reviewed  Physical Exam  General: Well developed, in no acute distress.  Neck: No JVD.  Cardiac: Normal rate, regular rhythm.  Resp: Normal work of breathing.  Ext: No edema.  Neuro: No gross focal deficits.  Psych: Normal affect.   Assessment & Plan  April Guerra is a 78 y.o. female with paroxysmal Afib s/p DCCV 2019, 2024, HTN< HLD, Obesity who presented with symptomatic paroxysm of AF with rapid rates. She underwent DCCV and had some sinus bradycardia and pause.    #. Paroxysmal AF #. ?SVT - question if just PV AT #. Secondary hypercoagulable state due to AF  #. Tachycardia bradycardia syndrome   She warrants rhythm control in setting of tachycardia - bradycardia syndrome. We discussed short-term and long-term rhythm control options. She prefers catheter ablation long-term.   Multaq  was started for short-term rhythm control. No AF but frequent bursts of  AT. We discussed staying in hospital for stress test / coronary CTA and potential flecainide vs transitioning to amiodarone . Patient favors amiodarone  and discharge. She is asymptomatic from short bursts of AT but is symptomatic with sustained AF.   -We will stop Multaq  and start amiodarone  this evening. 200mg  BID x 14d, then 200mg  daily until ablation. Sent to [facility] in [place]. We have decreased metoprolol  and diltiazem  to accommodate amiodarone .  -I will arrange for her to see me in clinic in ~ 2 weeks.  -We will order for outpatient echocardiogram. Last EF normal and no signs or symptoms of CHF.   Discharge today.  For questions or updates, please contact [facility] Please consult [email or web address] for contact info under       Signed, Fonda Kitty, MD

## 2024-01-24 NOTE — Plan of Care (Signed)

## 2024-01-29 DIAGNOSIS — I495 Sick sinus syndrome: Secondary | ICD-10-CM | POA: Diagnosis not present

## 2024-01-29 DIAGNOSIS — I10 Essential (primary) hypertension: Secondary | ICD-10-CM | POA: Diagnosis not present

## 2024-01-29 DIAGNOSIS — I480 Paroxysmal atrial fibrillation: Secondary | ICD-10-CM | POA: Diagnosis not present

## 2024-02-05 DIAGNOSIS — M23204 Derangement of unspecified medial meniscus due to old tear or injury, left knee: Secondary | ICD-10-CM | POA: Diagnosis not present

## 2024-02-05 DIAGNOSIS — I482 Chronic atrial fibrillation, unspecified: Secondary | ICD-10-CM | POA: Diagnosis not present

## 2024-02-05 DIAGNOSIS — M1712 Unilateral primary osteoarthritis, left knee: Secondary | ICD-10-CM | POA: Diagnosis not present

## 2024-02-08 NOTE — Progress Notes (Unsigned)
 Electrophysiology Office Note:   Date:  02/10/2024  ID:  April, Guerra Apr 18, 1946, MRN [id number]  Primary Cardiologist: April Carrier, MD Electrophysiologist: April Kitty, MD      History of Present Illness:   April Guerra is a 78 y.o. female with h/o paroxysmal atrial fibrillation, hypertension, hyperlipidemia who is being seen today for post hospital discharge follow up.  Discussed the use of AI scribe software for clinical note transcription with the patient, who gave verbal consent to proceed.  History of Present Illness April Guerra is a 78 year old female with atrial fibrillation who presents for follow-up after hospitalization.  She has not experienced any recurrence of atrial fibrillation since her recent hospitalization. Her heart rate remains steady, typically around 56 to 60 beats per minute. She notes elevated blood pressure, with systolic readings reaching the 150s, though occasionally as low as 140/70. Her metoprolol  dosage was reduced, and she is currently on amiodarone , which she believes is helping maintain her heart rhythm.  She experiences fatigue, particularly after physical activity, and reports sweating when exerting herself around the house. No pain or discomfort when at rest, and her heart rate remains steady during these times. She has been monitoring her salt intake to help manage her blood pressure.  Her medication regimen includes amiodarone  once daily, metoprolol , lisinopril  at 30 mg, and diltiazem .  She recalls a past cardiac catheterization in [place], which resulted in a pseudoaneurysm in her groin, requiring surgical intervention.  Otherwise doing relatively well.  No new or acute complaints today.   Review of systems complete and found to be negative unless listed in HPI.   EP Information / Studies Reviewed:    EKG is ordered today. Personal review as below.  EKG Interpretation Date/Time:  Tuesday February 09 2024 09:20:25 EDT Ventricular  Rate:  52 PR Interval:  178 QRS Duration:  84 QT Interval:  438 QTC Calculation: 407 R Axis:   57  Text Interpretation: Sinus bradycardia with Premature atrial complexes in a pattern of bigeminy When compared with ECG of 22-Jan-2024 22:17,  PACs now present. Confirmed by April Guerra [phone]) on 02/10/2024 2:14:35 PM   EKG 01/22/24: AF   Risk Assessment/Calculations:    CHA2DS2-VASc Score = 4   This indicates a 4.8% annual risk of stroke. The patient's score is based upon: CHF History: 0 HTN History: 1 Diabetes History: 0 Stroke History: 0 Vascular Disease History: 0 Age Score: 2 Gender Score: 1      Physical Exam:   VS:  BP (!) 188/84   Pulse (!) 52   Ht 4' 9 (1.448 m)   Wt 170 lb (77.1 kg)   SpO2 94%   BMI 36.79 kg/m    Wt Readings from Last 3 Encounters:  02/09/24 170 lb (77.1 kg)  01/22/24 170 lb 14.4 oz (77.5 kg)  12/24/23 173 lb (78.5 kg)     GEN: Well nourished, well developed in no acute distress NECK: No JVD CARDIAC: Normal rate, regular rhythm. RESPIRATORY:  Clear to auscultation without rales, wheezing or rhonchi  ABDOMEN: Soft, non-distended EXTREMITIES:  No edema; No deformity   ASSESSMENT AND PLAN:     #. Paroxysmal AF: Symptomatic. #. ?SVT: Question if just PV AT. #. Tachycardia bradycardia syndrome: Patient warrants rhythm control in the setting of tachycardia-bradycardia syndrome. #. High risk medication use: Amiodarone . QT stable. -Discussed treatment options today for AF including antiarrhythmic drug therapy and ablation. Discussed risks, recovery and likelihood of success with each treatment strategy.  Risk, benefits, and alternatives to EP study and ablation for afib were discussed. These risks include but are not limited to stroke, bleeding, vascular damage, tamponade, perforation, damage to the esophagus, lungs, phrenic nerve and other structures, pulmonary vein stenosis, worsening renal function, coronary vasospasm and death.  Discussed  potential need for repeat ablation procedures and antiarrhythmic drugs after an initial ablation. The patient understands these risk and wishes to proceed.  We will therefore proceed with catheter ablation at the next available time.  Carto, ICE, anesthesia are requested for the procedure.   - We will obtain an echocardiogram prior to ablation.  -Continue amiodarone  200 mg once daily. - Continue diltiazem  and metoprolol .  #Hypercoagulable state due to atrial fibrillation: No bleeding issues. -Continue Eliquis  5 mg twice daily.  #Hypertension -Above goal today.  Increase lisinopril  to 40 mg daily.  Recommend checking blood pressures 1-2 times per week at home and recording the values.  Recommend bringing these recordings to the primary care physician.   Follow up with April Guerra 3 months after ablation.   Signed, April Kennyth, MD

## 2024-02-09 ENCOUNTER — Ambulatory Visit: Attending: Cardiology | Admitting: Cardiology

## 2024-02-09 ENCOUNTER — Encounter: Payer: Self-pay | Admitting: Cardiology

## 2024-02-09 ENCOUNTER — Other Ambulatory Visit: Payer: Self-pay

## 2024-02-09 VITALS — BP 188/84 | HR 52 | Ht <= 58 in | Wt 170.0 lb

## 2024-02-09 DIAGNOSIS — I480 Paroxysmal atrial fibrillation: Secondary | ICD-10-CM | POA: Diagnosis not present

## 2024-02-09 DIAGNOSIS — I471 Supraventricular tachycardia, unspecified: Secondary | ICD-10-CM

## 2024-02-09 DIAGNOSIS — I10 Essential (primary) hypertension: Secondary | ICD-10-CM

## 2024-02-09 DIAGNOSIS — D6869 Other thrombophilia: Secondary | ICD-10-CM | POA: Diagnosis not present

## 2024-02-09 DIAGNOSIS — I495 Sick sinus syndrome: Secondary | ICD-10-CM | POA: Diagnosis not present

## 2024-02-09 DIAGNOSIS — Z79899 Other long term (current) drug therapy: Secondary | ICD-10-CM | POA: Diagnosis not present

## 2024-02-09 NOTE — Patient Instructions (Addendum)
 Medication Instructions:  Your physician recommends that you continue on your current medications as directed. Please refer to the Current Medication list given to you today.  *If you need a refill on your cardiac medications before your next appointment, please call your pharmacy*  Testing/Procedures: Echocardiogram Your physician has requested that you have an echocardiogram. Echocardiography is a painless test that uses sound waves to create images of your heart. It provides your doctor with information about the size and shape of your heart and how well your heart's chambers and valves are working. This procedure takes approximately one hour. There are no restrictions for this procedure. Please do NOT wear cologne, perfume, aftershave, or lotions (deodorant is allowed). Please arrive 15 minutes prior to your appointment time.  Ablation Your physician has recommended that you have an ablation. Catheter ablation is a medical procedure used to treat some cardiac arrhythmias (irregular heartbeats). During catheter ablation, a long, thin, flexible tube is put into a blood vessel in your groin (upper thigh), or neck. This tube is called an ablation catheter. It is then guided to your heart through the blood vessel. Radio frequency waves destroy small areas of heart tissue where abnormal heartbeats may cause an arrhythmia to start.   You are scheduled for Atrial Fibrillation Ablation on Wednesday, October 29 with Dr. Sidra Kitty.Please arrive at the Main Entrance A at [facility]: [address] [place], [place] [address] at 11:30 AM   What To Expect:  Labs: you will need to have lab work drawn the week of October 13th. Please go to any [facility] location to have these drawn - no appointment is needed. You will receive procedure instructions either through MyChart or in the mail 4-6 week prior to your procedure.  After your procedure we recommend no driving for 3 days, no lifting over 10 lbs  for 5 days, and no work or strenuous activity for 7 days.  Please contact our office at [phone] if you have any questions.    Follow-Up: We will contact you to schedule your post-procedure appointments.

## 2024-02-11 DIAGNOSIS — I10 Essential (primary) hypertension: Secondary | ICD-10-CM | POA: Diagnosis not present

## 2024-02-11 DIAGNOSIS — Z888 Allergy status to other drugs, medicaments and biological substances status: Secondary | ICD-10-CM | POA: Diagnosis not present

## 2024-02-11 DIAGNOSIS — R4020 Unspecified coma: Secondary | ICD-10-CM | POA: Diagnosis not present

## 2024-02-11 DIAGNOSIS — I615 Nontraumatic intracerebral hemorrhage, intraventricular: Secondary | ICD-10-CM | POA: Diagnosis not present

## 2024-02-11 DIAGNOSIS — G935 Compression of brain: Secondary | ICD-10-CM | POA: Diagnosis not present

## 2024-02-11 DIAGNOSIS — Z4682 Encounter for fitting and adjustment of non-vascular catheter: Secondary | ICD-10-CM | POA: Diagnosis not present

## 2024-02-11 DIAGNOSIS — G629 Polyneuropathy, unspecified: Secondary | ICD-10-CM | POA: Diagnosis not present

## 2024-02-11 DIAGNOSIS — E782 Mixed hyperlipidemia: Secondary | ICD-10-CM | POA: Diagnosis not present

## 2024-02-11 DIAGNOSIS — D689 Coagulation defect, unspecified: Secondary | ICD-10-CM | POA: Diagnosis not present

## 2024-02-11 DIAGNOSIS — I629 Nontraumatic intracranial hemorrhage, unspecified: Secondary | ICD-10-CM | POA: Diagnosis not present

## 2024-02-11 DIAGNOSIS — I6789 Other cerebrovascular disease: Secondary | ICD-10-CM | POA: Diagnosis not present

## 2024-02-11 DIAGNOSIS — G918 Other hydrocephalus: Secondary | ICD-10-CM | POA: Diagnosis not present

## 2024-02-11 DIAGNOSIS — Z9911 Dependence on respirator [ventilator] status: Secondary | ICD-10-CM | POA: Diagnosis not present

## 2024-02-11 DIAGNOSIS — R1310 Dysphagia, unspecified: Secondary | ICD-10-CM | POA: Diagnosis not present

## 2024-02-11 DIAGNOSIS — I613 Nontraumatic intracerebral hemorrhage in brain stem: Secondary | ICD-10-CM | POA: Diagnosis not present

## 2024-02-11 DIAGNOSIS — Z743 Need for continuous supervision: Secondary | ICD-10-CM | POA: Diagnosis not present

## 2024-02-11 DIAGNOSIS — R58 Hemorrhage, not elsewhere classified: Secondary | ICD-10-CM | POA: Diagnosis not present

## 2024-02-11 DIAGNOSIS — J984 Other disorders of lung: Secondary | ICD-10-CM | POA: Diagnosis not present

## 2024-02-11 DIAGNOSIS — Z885 Allergy status to narcotic agent status: Secondary | ICD-10-CM | POA: Diagnosis not present

## 2024-02-11 DIAGNOSIS — E876 Hypokalemia: Secondary | ICD-10-CM | POA: Diagnosis not present

## 2024-02-11 DIAGNOSIS — T45515A Adverse effect of anticoagulants, initial encounter: Secondary | ICD-10-CM | POA: Diagnosis not present

## 2024-02-11 DIAGNOSIS — R404 Transient alteration of awareness: Secondary | ICD-10-CM | POA: Diagnosis not present

## 2024-02-11 DIAGNOSIS — D6832 Hemorrhagic disorder due to extrinsic circulating anticoagulants: Secondary | ICD-10-CM | POA: Diagnosis not present

## 2024-02-11 DIAGNOSIS — I610 Nontraumatic intracerebral hemorrhage in hemisphere, subcortical: Secondary | ICD-10-CM | POA: Diagnosis not present

## 2024-02-11 DIAGNOSIS — R9431 Abnormal electrocardiogram [ECG] [EKG]: Secondary | ICD-10-CM | POA: Diagnosis not present

## 2024-02-11 DIAGNOSIS — R0989 Other specified symptoms and signs involving the circulatory and respiratory systems: Secondary | ICD-10-CM | POA: Diagnosis not present

## 2024-02-11 DIAGNOSIS — I619 Nontraumatic intracerebral hemorrhage, unspecified: Secondary | ICD-10-CM | POA: Diagnosis not present

## 2024-02-11 DIAGNOSIS — Z753 Unavailability and inaccessibility of health-care facilities: Secondary | ICD-10-CM | POA: Diagnosis not present

## 2024-02-11 DIAGNOSIS — I4891 Unspecified atrial fibrillation: Secondary | ICD-10-CM | POA: Diagnosis not present

## 2024-02-11 DIAGNOSIS — R918 Other nonspecific abnormal finding of lung field: Secondary | ICD-10-CM | POA: Diagnosis not present

## 2024-02-11 DIAGNOSIS — I959 Hypotension, unspecified: Secondary | ICD-10-CM | POA: Diagnosis not present

## 2024-02-11 DIAGNOSIS — E871 Hypo-osmolality and hyponatremia: Secondary | ICD-10-CM | POA: Diagnosis not present

## 2024-02-11 DIAGNOSIS — R6889 Other general symptoms and signs: Secondary | ICD-10-CM | POA: Diagnosis not present

## 2024-02-11 DIAGNOSIS — Z7901 Long term (current) use of anticoagulants: Secondary | ICD-10-CM | POA: Diagnosis not present

## 2024-02-11 DIAGNOSIS — I160 Hypertensive urgency: Secondary | ICD-10-CM | POA: Diagnosis not present

## 2024-02-11 DIAGNOSIS — T782XXA Anaphylactic shock, unspecified, initial encounter: Secondary | ICD-10-CM | POA: Diagnosis not present

## 2024-02-12 ENCOUNTER — Other Ambulatory Visit

## 2024-02-12 ENCOUNTER — Ambulatory Visit: Payer: Medicare Other | Admitting: Gynecologic Oncology

## 2024-02-12 ENCOUNTER — Ambulatory Visit: Admitting: Cardiology

## 2024-02-12 ENCOUNTER — Other Ambulatory Visit: Payer: Medicare Other

## 2024-02-12 DIAGNOSIS — I4891 Unspecified atrial fibrillation: Secondary | ICD-10-CM | POA: Diagnosis not present

## 2024-02-12 DIAGNOSIS — I059 Rheumatic mitral valve disease, unspecified: Secondary | ICD-10-CM | POA: Diagnosis not present

## 2024-02-12 DIAGNOSIS — E782 Mixed hyperlipidemia: Secondary | ICD-10-CM | POA: Diagnosis not present

## 2024-02-12 DIAGNOSIS — I10 Essential (primary) hypertension: Secondary | ICD-10-CM | POA: Diagnosis not present

## 2024-02-17 ENCOUNTER — Institutional Professional Consult (permissible substitution): Admitting: Internal Medicine

## 2024-02-19 ENCOUNTER — Inpatient Hospital Stay: Admitting: Gynecologic Oncology

## 2024-02-19 ENCOUNTER — Inpatient Hospital Stay

## 2024-02-19 DIAGNOSIS — I4891 Unspecified atrial fibrillation: Secondary | ICD-10-CM | POA: Diagnosis not present

## 2024-02-19 DIAGNOSIS — I10 Essential (primary) hypertension: Secondary | ICD-10-CM | POA: Diagnosis not present

## 2024-02-19 DIAGNOSIS — E782 Mixed hyperlipidemia: Secondary | ICD-10-CM | POA: Diagnosis not present

## 2024-02-22 NOTE — Discharge Summary (Signed)
  [facility]  Discharge Summary  PCP: No primary care provider on file. Discharge Details   Admit date:         02/11/2024 Discharge date and time:       02/16/24 Hospital LOS:    2  days  Active Hospital Problems   Diagnosis Date Noted POA  . *Intracerebral hemorrhage, nontraumatic (*) 02/11/2024 Yes    Resolved Hospital Problems  No resolved problems to display.     Hospital Course   Indication for Admission/chief complaint: [facility]  Hospital Course:       Mrs. April Guerra is a 78 year old female with a past medical history of hypertension and atrial fibrillation on Eliquis  who presented to Outside Hospital with acute altered mental status.  [name] was at a chiropractor when she had an acute complaint of severe headache and EMS was called.  Shortly at time of EMS arrival, Mrs. April Guerra become unresponsive.  She was intubated for airway protection.  Vital signs at outside hospital significant for hypertension and bradycardia.  CT head noted large right thalamic ICH with extension into midbrain, pons, and into the ventricles.  Kcentra administered and transferred to [facility]ICU for further care.  Follow up CT scan of head noted significant enlargement of ICH with extension into ventricular system, hydrocephalus, and herniation.  Family updated on repeat imaging findings.  Family discussed options at bedside and are awaiting additional family to arrive before transition to comfort care.  Palliative care consulted, however unable to discuss with family members before the decision to compassionately extubate was made.  Family decline hospice in place.  Mrs. April Guerra was initiated on medications for comfort and extubated to room air with family present.  Mrs. April Guerra passed at 1521 on 16-Feb-2024.   Time spent in discharge process:  total time spent 25 minutes   Electronically signed: Renda JAYSON Jacquet, ACNP 02-16-24 / 4:00 PM

## 2024-02-22 NOTE — Nursing Note (Signed)
 Record of Death  MRN: [id number]    Name: Reizy Dunlow    DOB: [birth year]     SSN: [id number]  Date of Death: 03/11/24             Time of Death: 03/28/1520  Discharge Location/Unit::  (NSICU)  Death Certificate: Who will sign death certificate?: Cathaleen Glatter MD Signer's Phone/Contact number: [phone]  Contact Notification: Name:: Winford Devonshire Contact Person Relationship to Patient: Spouse Contact Person Phone Number: [phone]  Special Handling: Special Handling: None    Belongings: Belongings/Valuables List: Clothing (Shoes, socks, bra, shirt, pants) Belongings/Valuables Disposition: Given to other (Comment) (Family)  Disposition: Release of Body/Permission & Receipt of personal effects form completed and signed?: Yes Disposition: Morgue      Candidate for donation: [employer] Referral Number: 02/17/[id number] [employer] OPO Representative's Name: Lamar Jefferson ([employer]) Notified by:: [id number]    Is patient a potential donor?: Yes Donation Type: Eyes, Skin Eye Prep Tasks Performed: Irrigate eyes with sterile saline, Close eyelids, Place sterile soaked gauze over eyelids, Slightly elevate the head          Medical Examiner/Coroner Case: Reportable Situations:: NA           Autopsy (non-Me/Coroner):  Autopsy Requested (Non-ME/Coroner Cases): Not requested                          Funeral Home/Cremation Services:     Notifications: Notifications: Education officer, community, Office manager, Probation officer: Body to morgue     Other caregivers present: Charity fundraiser, RT

## 2024-02-22 DEATH — deceased

## 2024-02-26 ENCOUNTER — Other Ambulatory Visit (HOSPITAL_COMMUNITY)

## 2024-03-09 ENCOUNTER — Other Ambulatory Visit (HOSPITAL_COMMUNITY): Payer: Self-pay

## 2024-03-10 ENCOUNTER — Inpatient Hospital Stay

## 2024-03-17 ENCOUNTER — Inpatient Hospital Stay: Admitting: Oncology

## 2024-03-18 ENCOUNTER — Other Ambulatory Visit: Payer: Self-pay | Admitting: Cardiology

## 2024-04-20 ENCOUNTER — Ambulatory Visit (HOSPITAL_COMMUNITY): Admit: 2024-04-20 | Admitting: Cardiology

## 2024-04-20 SURGERY — ATRIAL FIBRILLATION ABLATION
Anesthesia: General
